# Patient Record
Sex: Male | Born: 1955 | Race: Black or African American | Hispanic: No | Marital: Single | Smoking: Former smoker
Health system: Southern US, Community
[De-identification: ages and names within clinical notes are randomized; demographics above are authoritative.]

## PROBLEM LIST (undated history)

## (undated) DIAGNOSIS — I509 Heart failure, unspecified: Secondary | ICD-10-CM

## (undated) DIAGNOSIS — E7800 Pure hypercholesterolemia, unspecified: Secondary | ICD-10-CM

## (undated) DIAGNOSIS — I10 Essential (primary) hypertension: Secondary | ICD-10-CM

## (undated) HISTORY — DX: Heart failure, unspecified: I50.9

## (undated) HISTORY — PX: APPENDECTOMY: SHX54

## (undated) HISTORY — PX: LUMBAR DISC SURGERY: SHX700

## (undated) HISTORY — PX: BACK SURGERY: SHX140

---

## 1998-11-13 ENCOUNTER — Emergency Department (HOSPITAL_COMMUNITY): Admission: EM | Admit: 1998-11-13 | Discharge: 1998-11-13 | Payer: Self-pay | Admitting: Emergency Medicine

## 1999-10-15 ENCOUNTER — Ambulatory Visit (HOSPITAL_COMMUNITY): Admission: RE | Admit: 1999-10-15 | Discharge: 1999-10-15 | Payer: Self-pay | Admitting: Occupational Medicine

## 1999-10-15 ENCOUNTER — Encounter: Payer: Self-pay | Admitting: Occupational Medicine

## 1999-11-20 ENCOUNTER — Emergency Department (HOSPITAL_COMMUNITY): Admission: EM | Admit: 1999-11-20 | Discharge: 1999-11-20 | Payer: Self-pay | Admitting: Emergency Medicine

## 1999-11-28 ENCOUNTER — Ambulatory Visit (HOSPITAL_COMMUNITY): Admission: RE | Admit: 1999-11-28 | Discharge: 1999-11-28 | Payer: Self-pay | Admitting: Neurosurgery

## 1999-11-28 ENCOUNTER — Encounter: Payer: Self-pay | Admitting: Neurosurgery

## 1999-12-29 ENCOUNTER — Ambulatory Visit (HOSPITAL_COMMUNITY): Admission: RE | Admit: 1999-12-29 | Discharge: 1999-12-29 | Payer: Self-pay | Admitting: *Deleted

## 1999-12-29 ENCOUNTER — Encounter: Payer: Self-pay | Admitting: *Deleted

## 2000-01-12 ENCOUNTER — Ambulatory Visit (HOSPITAL_COMMUNITY): Admission: RE | Admit: 2000-01-12 | Discharge: 2000-01-12 | Payer: Self-pay | Admitting: *Deleted

## 2000-01-12 ENCOUNTER — Encounter: Payer: Self-pay | Admitting: Neurosurgery

## 2000-01-22 ENCOUNTER — Ambulatory Visit (HOSPITAL_COMMUNITY): Admission: RE | Admit: 2000-01-22 | Discharge: 2000-01-22 | Payer: Self-pay | Admitting: *Deleted

## 2000-01-22 ENCOUNTER — Encounter: Payer: Self-pay | Admitting: *Deleted

## 2000-03-18 ENCOUNTER — Ambulatory Visit (HOSPITAL_COMMUNITY): Admission: RE | Admit: 2000-03-18 | Discharge: 2000-03-19 | Payer: Self-pay | Admitting: Neurosurgery

## 2000-03-18 ENCOUNTER — Encounter: Payer: Self-pay | Admitting: Neurosurgery

## 2000-09-23 ENCOUNTER — Encounter: Payer: Self-pay | Admitting: Neurosurgery

## 2000-09-23 ENCOUNTER — Ambulatory Visit (HOSPITAL_COMMUNITY): Admission: RE | Admit: 2000-09-23 | Discharge: 2000-09-23 | Payer: Self-pay | Admitting: Neurosurgery

## 2000-10-14 ENCOUNTER — Encounter: Admission: RE | Admit: 2000-10-14 | Discharge: 2001-01-12 | Payer: Self-pay | Admitting: Anesthesiology

## 2000-10-15 ENCOUNTER — Encounter: Payer: Self-pay | Admitting: Neurosurgery

## 2000-10-15 ENCOUNTER — Ambulatory Visit (HOSPITAL_COMMUNITY): Admission: RE | Admit: 2000-10-15 | Discharge: 2000-10-15 | Payer: Self-pay | Admitting: Neurosurgery

## 2001-02-03 ENCOUNTER — Encounter: Payer: Self-pay | Admitting: Neurosurgery

## 2001-02-08 ENCOUNTER — Encounter: Payer: Self-pay | Admitting: Neurosurgery

## 2001-02-08 ENCOUNTER — Inpatient Hospital Stay (HOSPITAL_COMMUNITY): Admission: RE | Admit: 2001-02-08 | Discharge: 2001-02-11 | Payer: Self-pay | Admitting: Neurosurgery

## 2001-02-21 ENCOUNTER — Encounter: Admission: RE | Admit: 2001-02-21 | Discharge: 2001-04-09 | Payer: Self-pay | Admitting: Anesthesiology

## 2001-02-22 ENCOUNTER — Emergency Department (HOSPITAL_COMMUNITY): Admission: EM | Admit: 2001-02-22 | Discharge: 2001-02-22 | Payer: Self-pay | Admitting: Emergency Medicine

## 2001-02-22 ENCOUNTER — Emergency Department (HOSPITAL_COMMUNITY): Admission: EM | Admit: 2001-02-22 | Discharge: 2001-02-23 | Payer: Self-pay | Admitting: Emergency Medicine

## 2001-02-22 ENCOUNTER — Encounter: Payer: Self-pay | Admitting: Emergency Medicine

## 2001-03-10 ENCOUNTER — Other Ambulatory Visit (HOSPITAL_COMMUNITY): Admission: RE | Admit: 2001-03-10 | Discharge: 2001-03-15 | Payer: Self-pay | Admitting: Psychiatry

## 2001-04-01 ENCOUNTER — Encounter: Payer: Self-pay | Admitting: Neurosurgery

## 2001-04-01 ENCOUNTER — Ambulatory Visit (HOSPITAL_COMMUNITY): Admission: RE | Admit: 2001-04-01 | Discharge: 2001-04-01 | Payer: Self-pay | Admitting: Neurosurgery

## 2005-09-20 ENCOUNTER — Emergency Department (HOSPITAL_COMMUNITY): Admission: EM | Admit: 2005-09-20 | Discharge: 2005-09-20 | Payer: Self-pay | Admitting: Emergency Medicine

## 2005-09-20 IMAGING — CT CT HEAD W/O CM
1 series · 16 of 30 positions shown, 20 images · non-contrast
Comparison: None

CLINICAL DATA: Headache

HEAD CT WITHOUT CONTRAST
TECHNIQUE: 5mm collimated images were obtained from the base of the skull
through the vertex according to standard protocol without contrast.

[Series 2: head_seq 4.5 h45s st · axial · 0.43mm/px · z∈[-191,-29]mm · 16 of 40 slices shown, 20 images]
[im 2/40  brain]
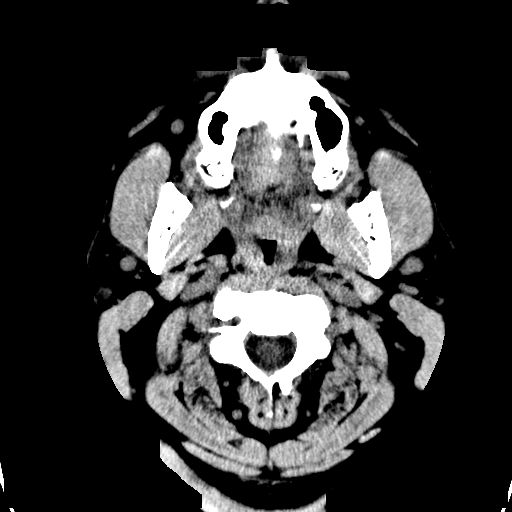
[im 2/40  bone]
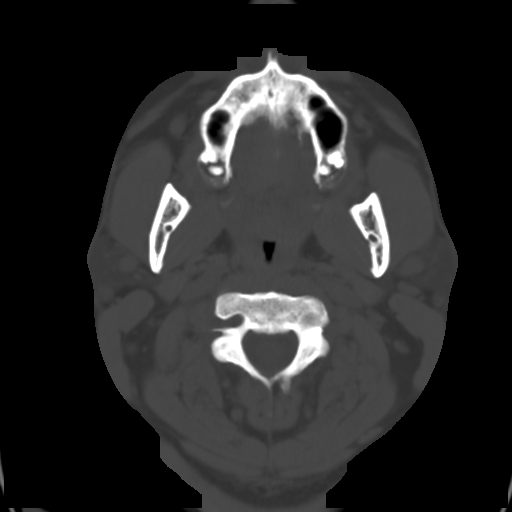
[im 5/40  brain]
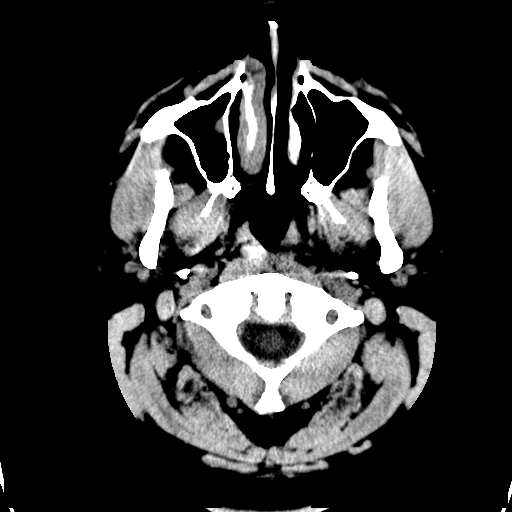
[im 7/40  brain]
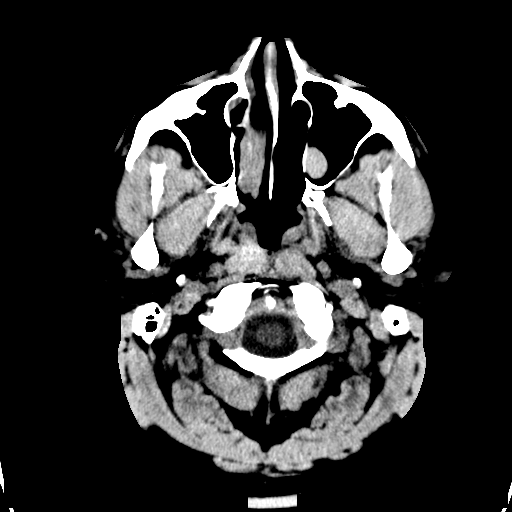
[im 10/40  brain]
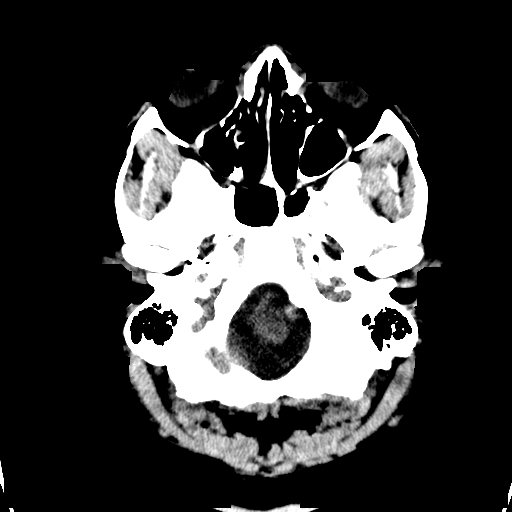
[im 11/40  brain]
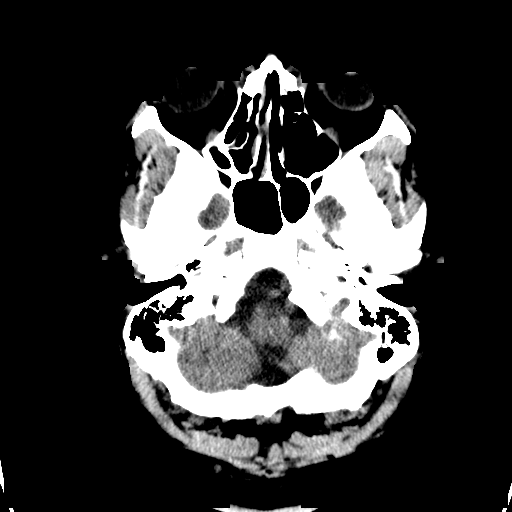
[im 11/40  bone]
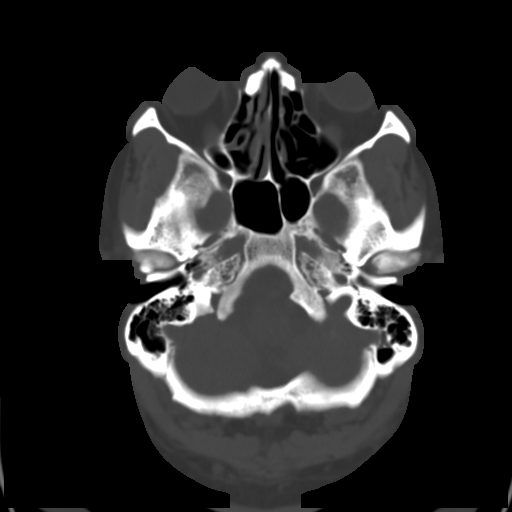
[im 14/40  brain]
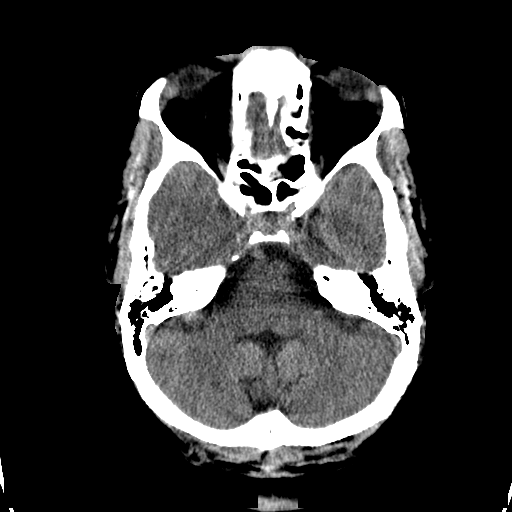
[im 17/40  brain]
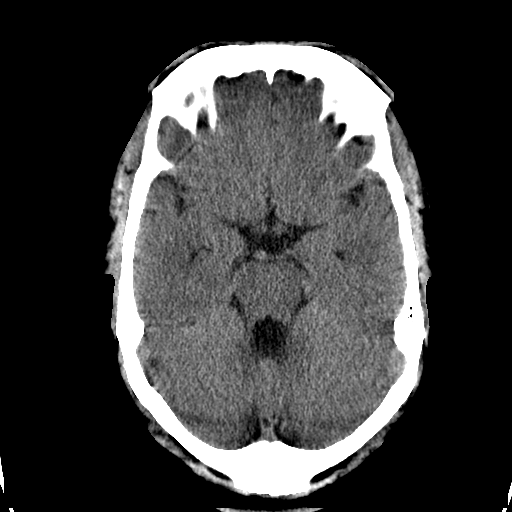
[im 19/40  brain]
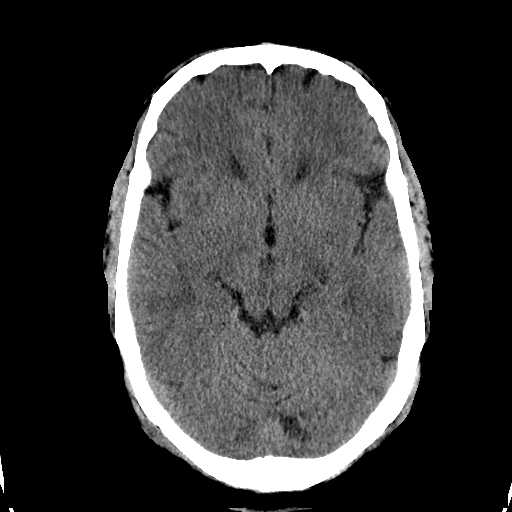
[im 21/40  brain]
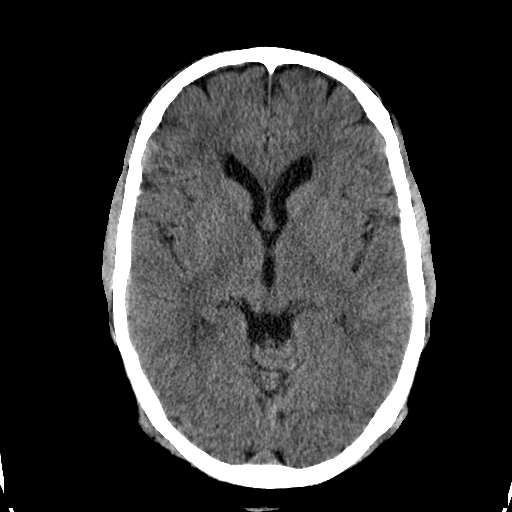
[im 21/40  bone]
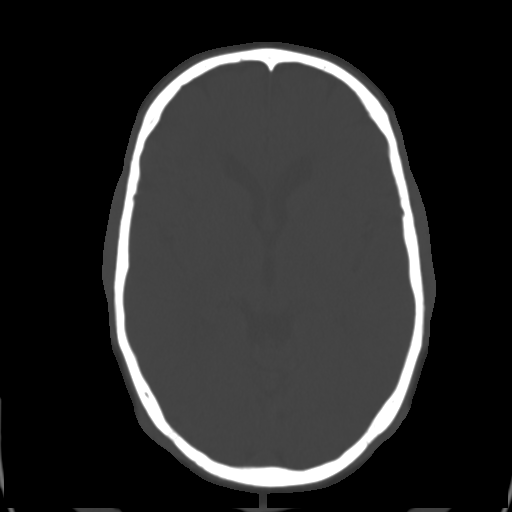
[im 23/40  brain]
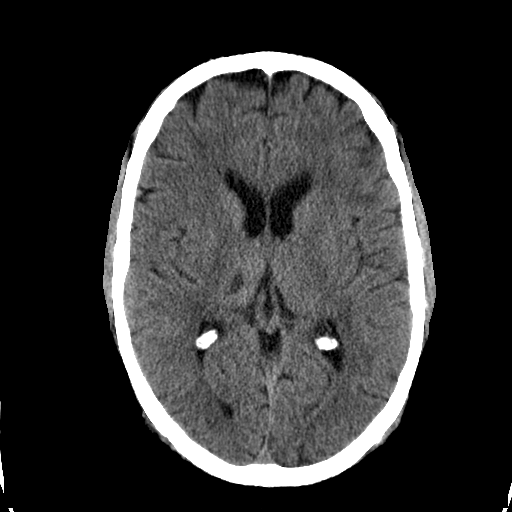
[im 26/40  brain]
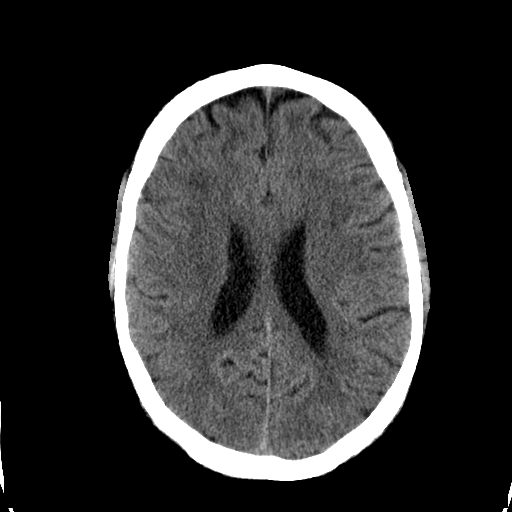
[im 29/40  brain]
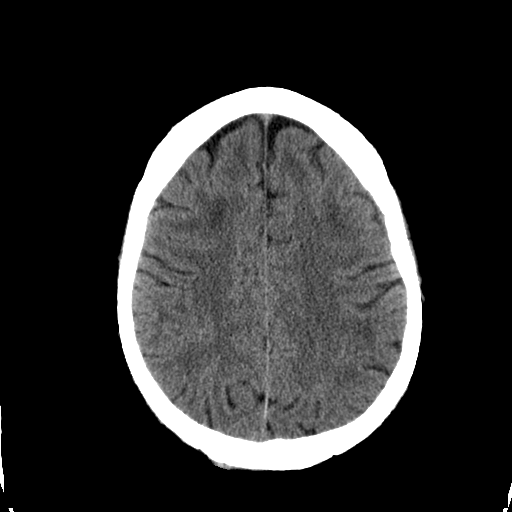
[im 30/40  brain]
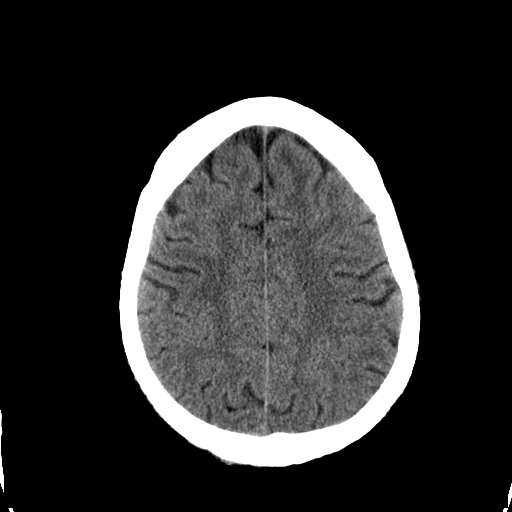
[im 30/40  bone]
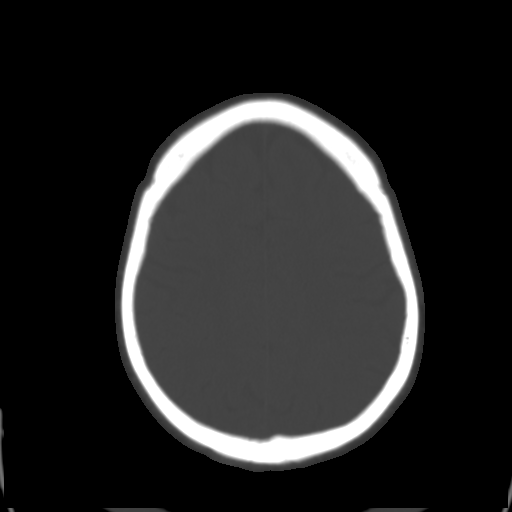
[im 33/40  brain]
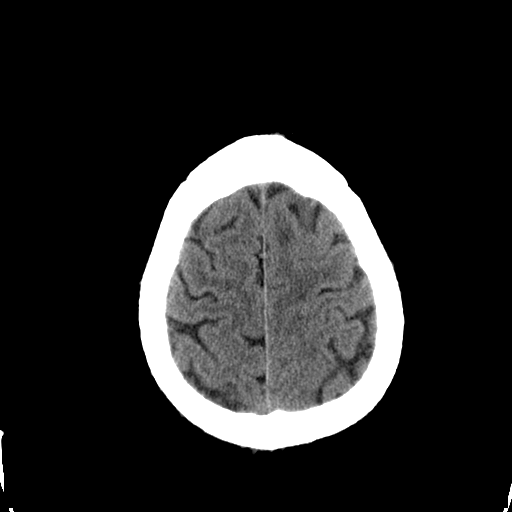
[im 35/40  brain]
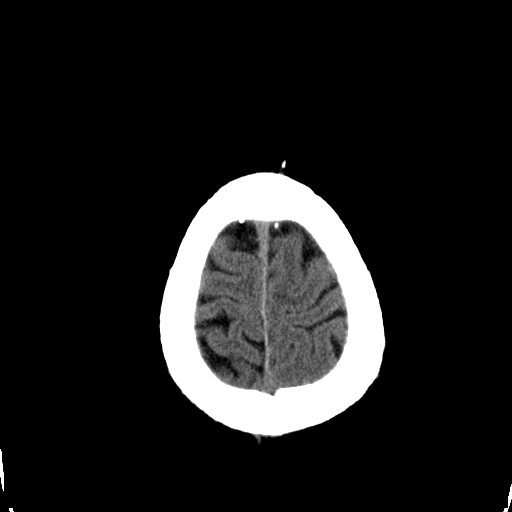
[im 38/40  brain]
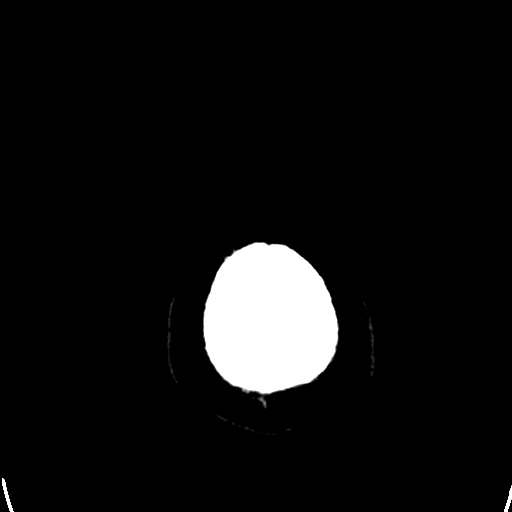

[16 of 30 positions shown; findings below may reference images not displayed]

FINDINGS: There is a low density area within the right thalamus compatible with
lacunar infarct. This is likely subacute or chronic in age. No areas of
hemorrhage, hydrocephalus, tumor, vascular lesion, or acute infarction. Chronic
sinusitis changes. Visualized calvarium unremarkable.

IMPRESSION

Subacute to chronic right thalamic lacunar infarct.

## 2006-02-16 ENCOUNTER — Emergency Department (HOSPITAL_COMMUNITY): Admission: EM | Admit: 2006-02-16 | Discharge: 2006-02-16 | Payer: Self-pay | Admitting: Emergency Medicine

## 2006-02-16 IMAGING — CR DG CHEST 2V
2 series · 2 of 2 positions shown · non-contrast
Comparison: None.

CLINICAL DATA: Weakness and chest pain.  
 CHEST - 2 VIEW:

[view not recorded (1 of 2)]
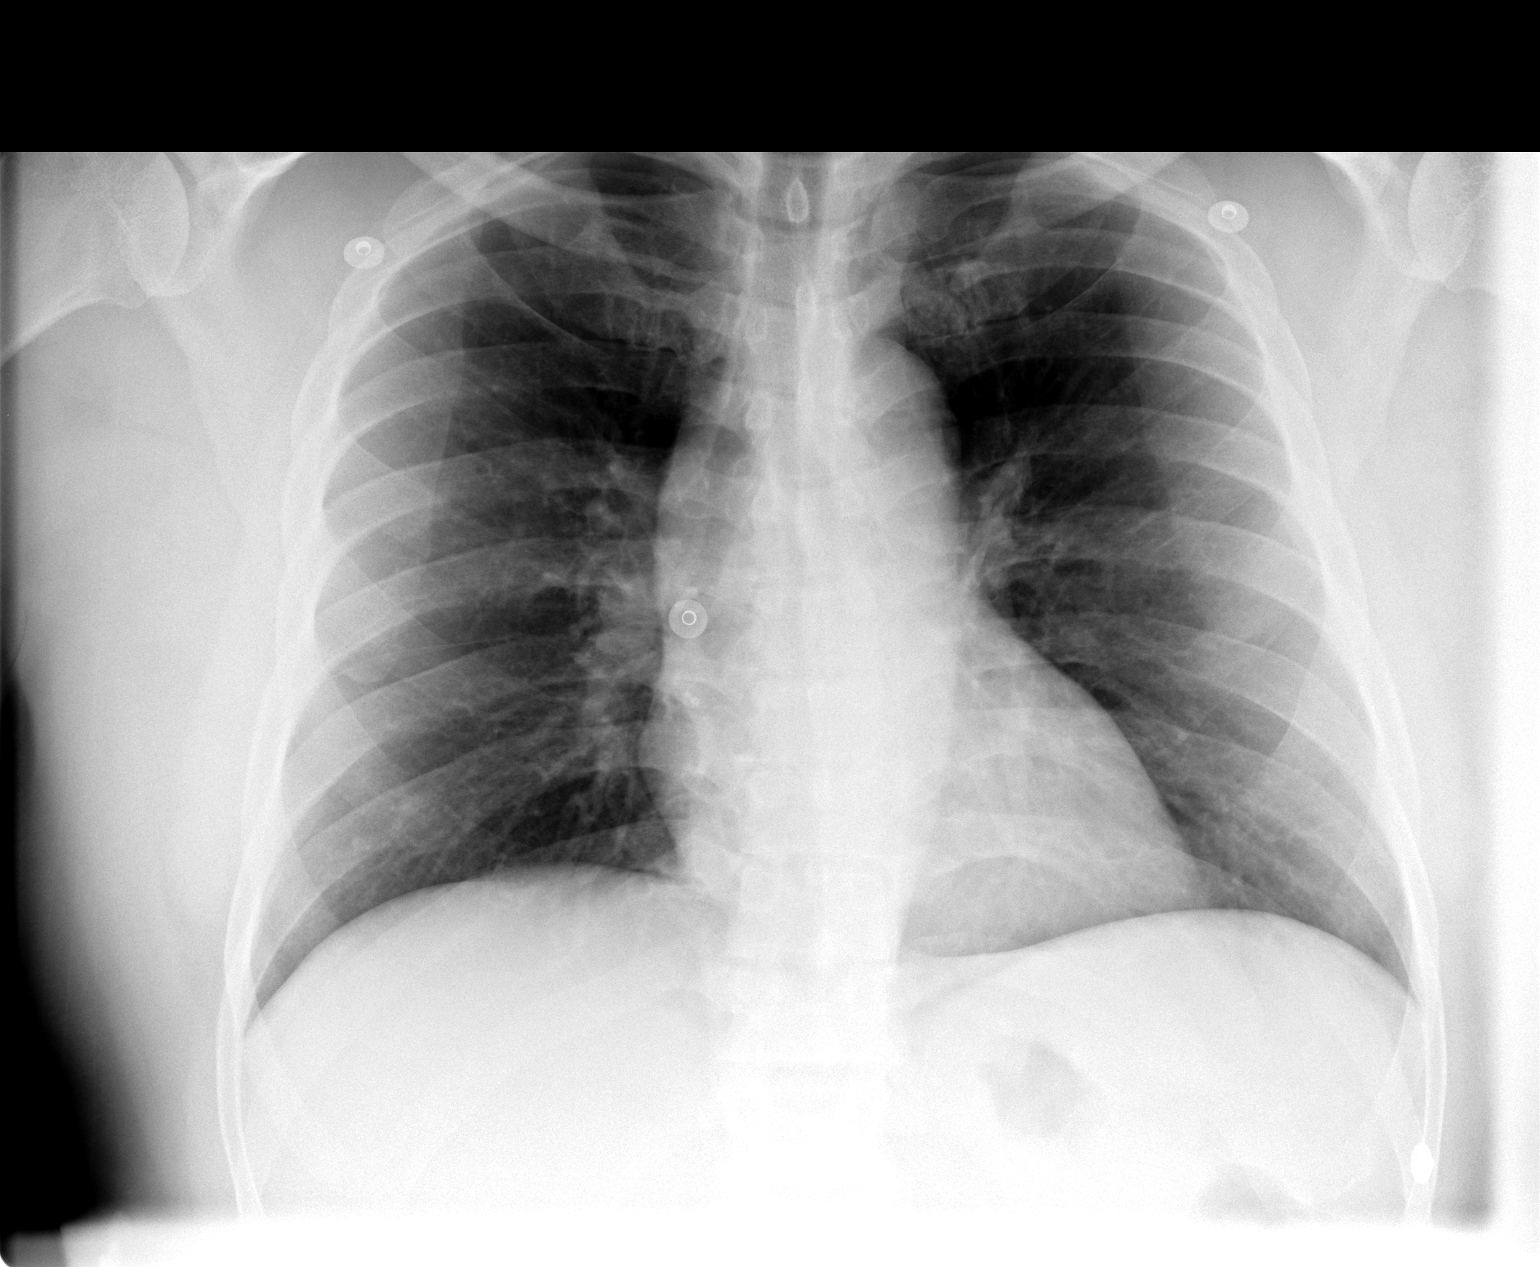

[view not recorded (2 of 2)]
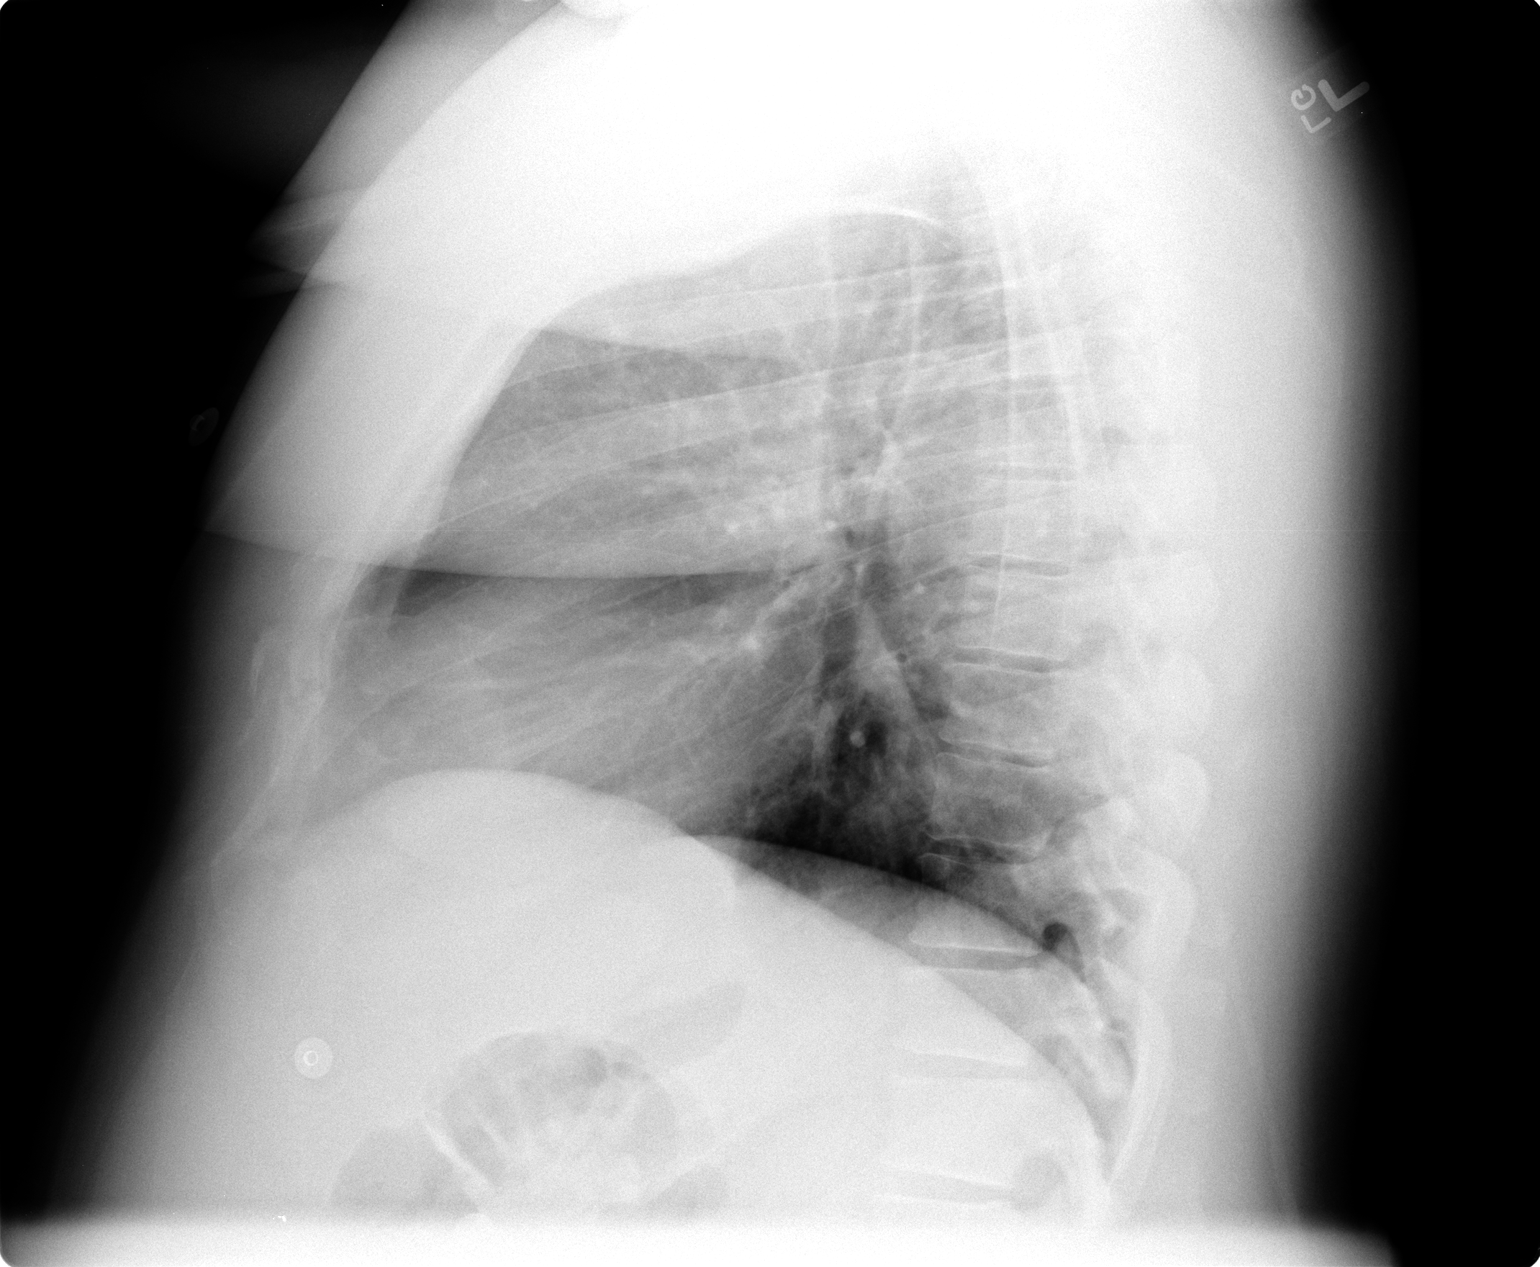

[2 of 2 positions shown; findings below may reference images not displayed]

FINDINGS: Cardiomediastinal silhouette is unremarkable. The lungs are clear.  No evidence of focal airspace disease, pneumothorax, or pleural effusions.  Minimal peribronchial thickening is identified likely chronic.  The visualized bony thorax and upper abdomen are unremarkable.
IMPRESSION: Mild peribronchial thickening without focal airspace disease.

## 2006-02-16 IMAGING — CT CT HEAD W/O CM
1 series · 16 of 30 positions shown, 20 images · IV contrast (agent unspecified)
Comparison: [DATE].

CLINICAL DATA: Headache, left-sided weakness.
 HEAD CT WITHOUT CONTRAST:
TECHNIQUE: Contiguous axial images were obtained from the base of the skull through the vertex according to standard protocol without contrast.

[Series 2: head_seq 5.0 h45s · axial · 0.43mm/px · z∈[-140,-5]mm · 16 of 30 slices shown, 20 images]
[im 2/30  brain]
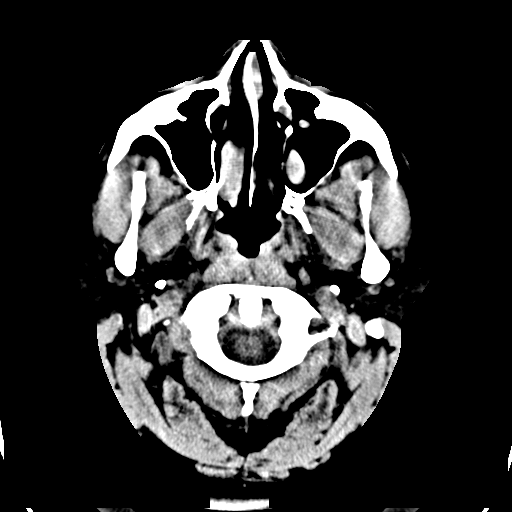
[im 2/30  bone]
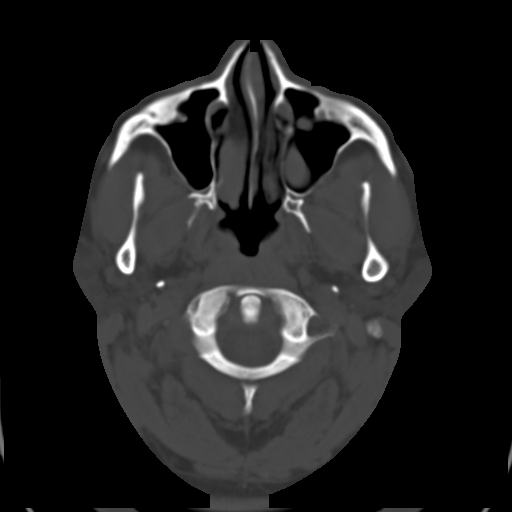
[im 4/30  brain]
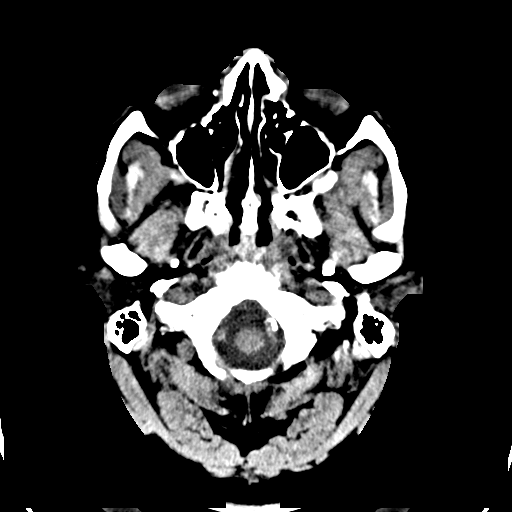
[im 6/30  brain]
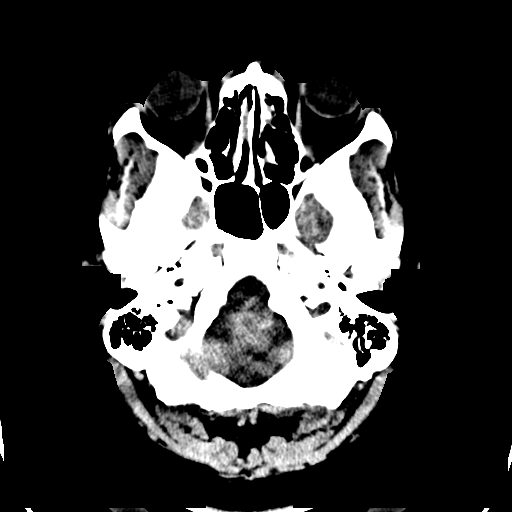
[im 8/30  brain]
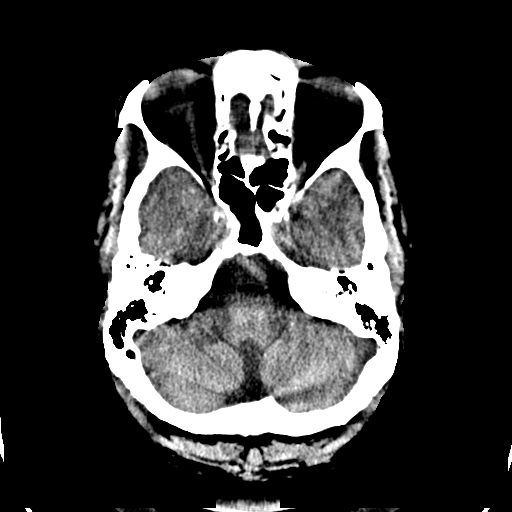
[im 9/30  brain]
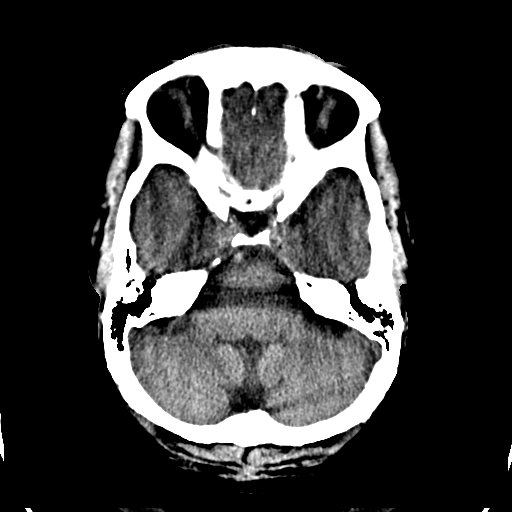
[im 9/30  bone]
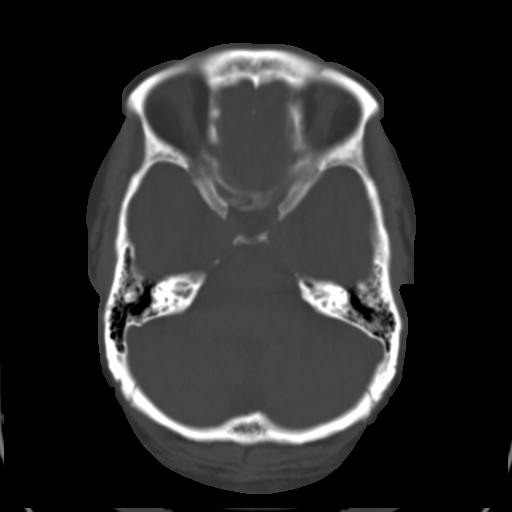
[im 11/30  brain]
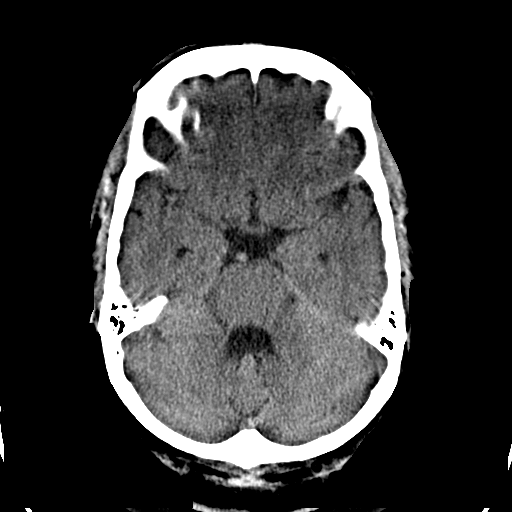
[im 13/30  brain]
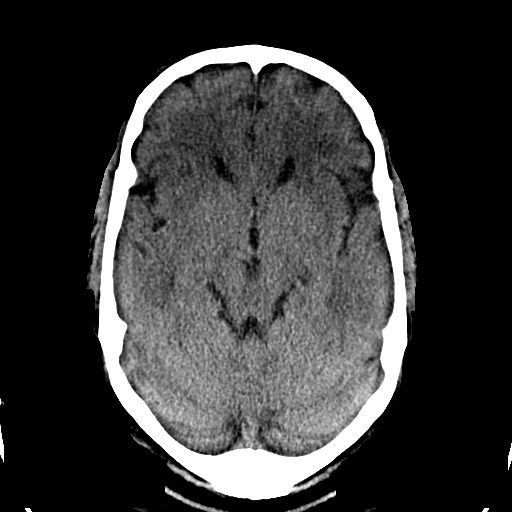
[im 15/30  brain]
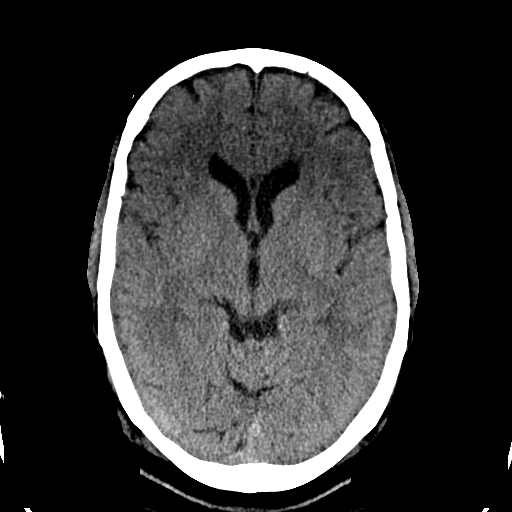
[im 16/30  brain]
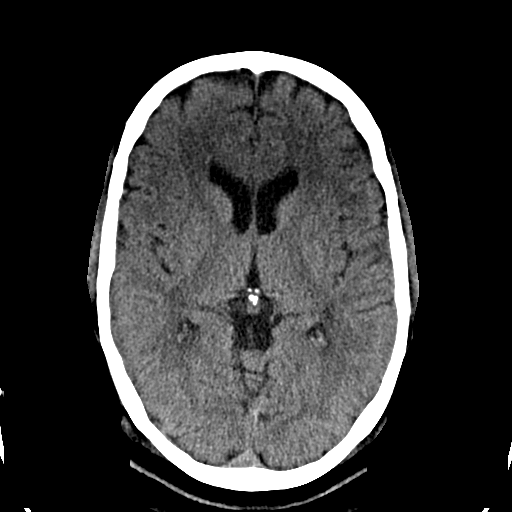
[im 16/30  bone]
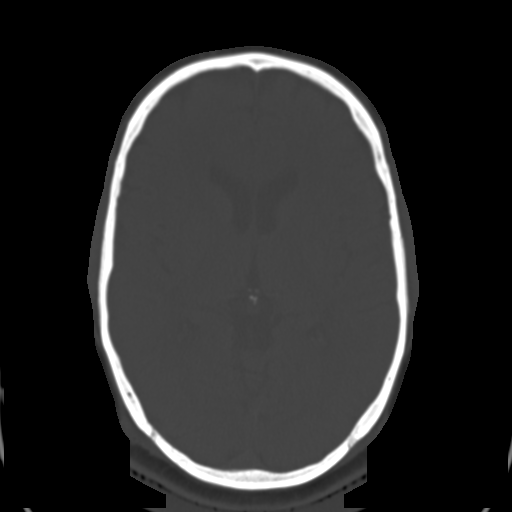
[im 18/30  brain]
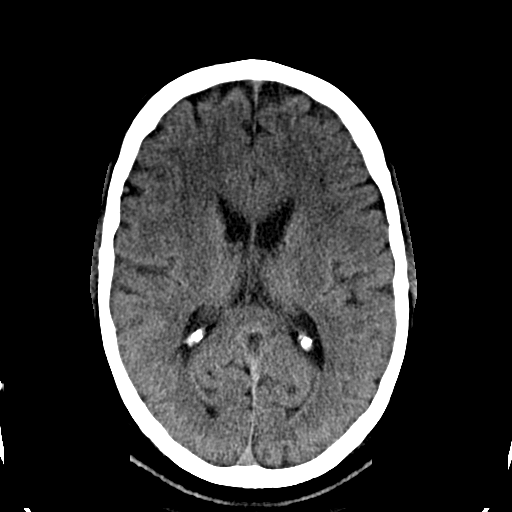
[im 20/30  brain]
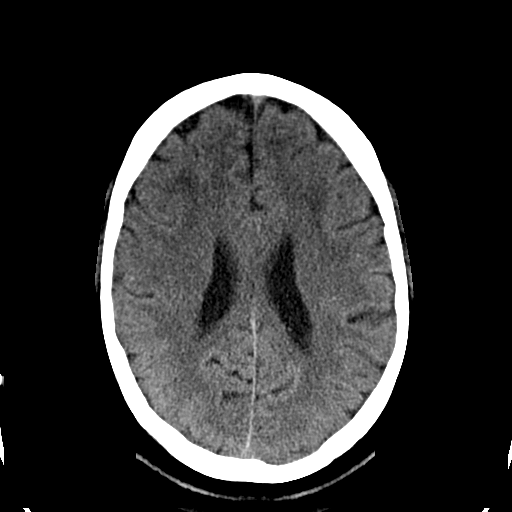
[im 22/30  brain]
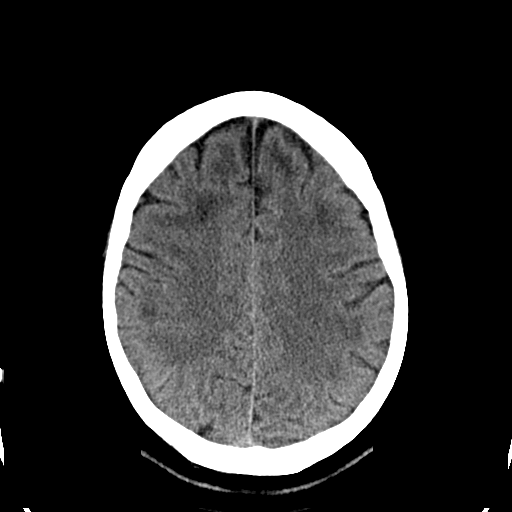
[im 23/30  brain]
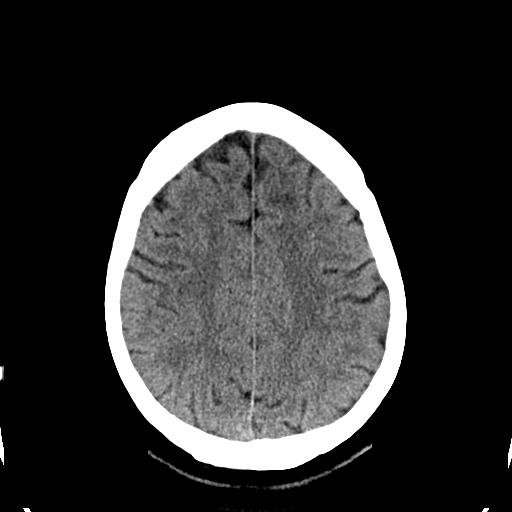
[im 23/30  bone]
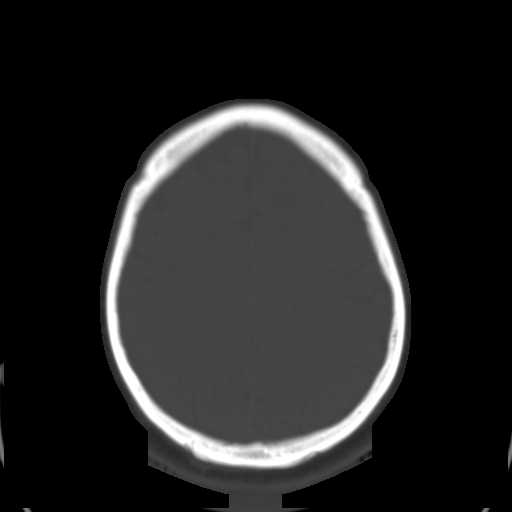
[im 25/30  brain]
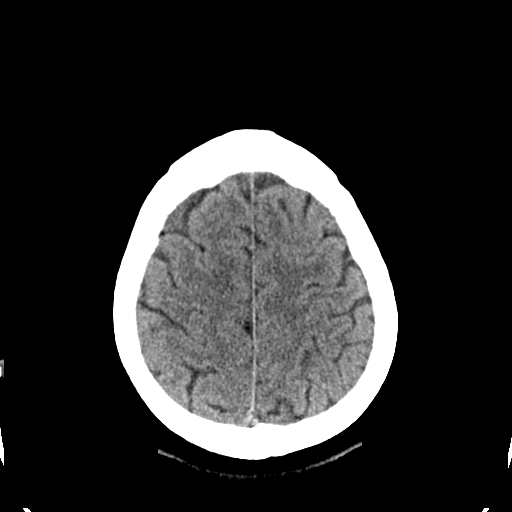
[im 27/30  brain]
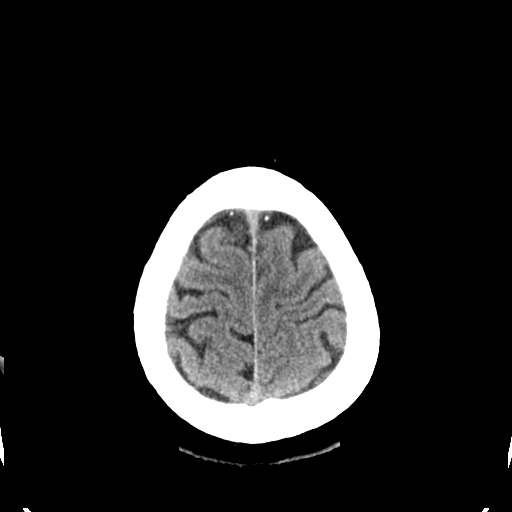
[im 29/30  brain]
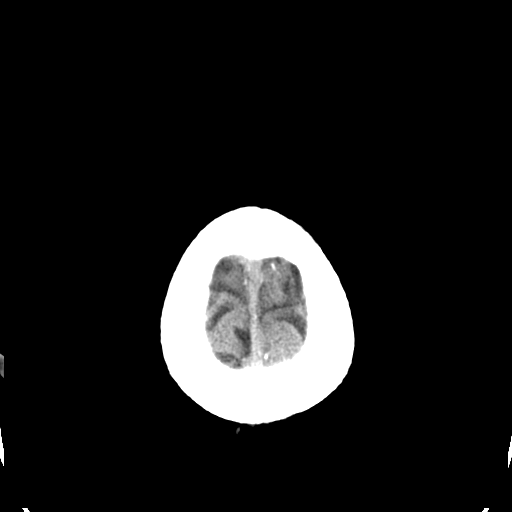

[16 of 30 positions shown; findings below may reference images not displayed]

FINDINGS: Ventricular size and CSF spaces within normal limits.  There is a right thalamic infarct that has not changed.  There is some periventricular white matter disease mostly noted in the frontal lobes bilaterally.  There is a small low density area in the right parietal region seen on image #22.  I cannot say that this was present previously.  This could be a small infarct. 
 No definite acute infarct or bleed. 
 Calvarium intact.  No fluid in the sinuses visualized, although there is a left maxillary retention cyst or polyp that was present previously.
IMPRESSION: 1.  There is chronic microvascular white matter disease.
 2.  Possible small acute right parietal infarct.  See report.

## 2009-08-18 ENCOUNTER — Emergency Department (HOSPITAL_BASED_OUTPATIENT_CLINIC_OR_DEPARTMENT_OTHER): Admission: EM | Admit: 2009-08-18 | Discharge: 2009-08-18 | Payer: Self-pay | Admitting: Emergency Medicine

## 2009-08-18 ENCOUNTER — Ambulatory Visit: Payer: Self-pay | Admitting: Diagnostic Radiology

## 2009-08-18 IMAGING — CR DG CHEST 2V
2 series · 2 of 2 positions shown · non-contrast
Comparison: [DATE]

CLINICAL DATA: Congestion and cough.

CHEST - 2 VIEW

[w chest pa]
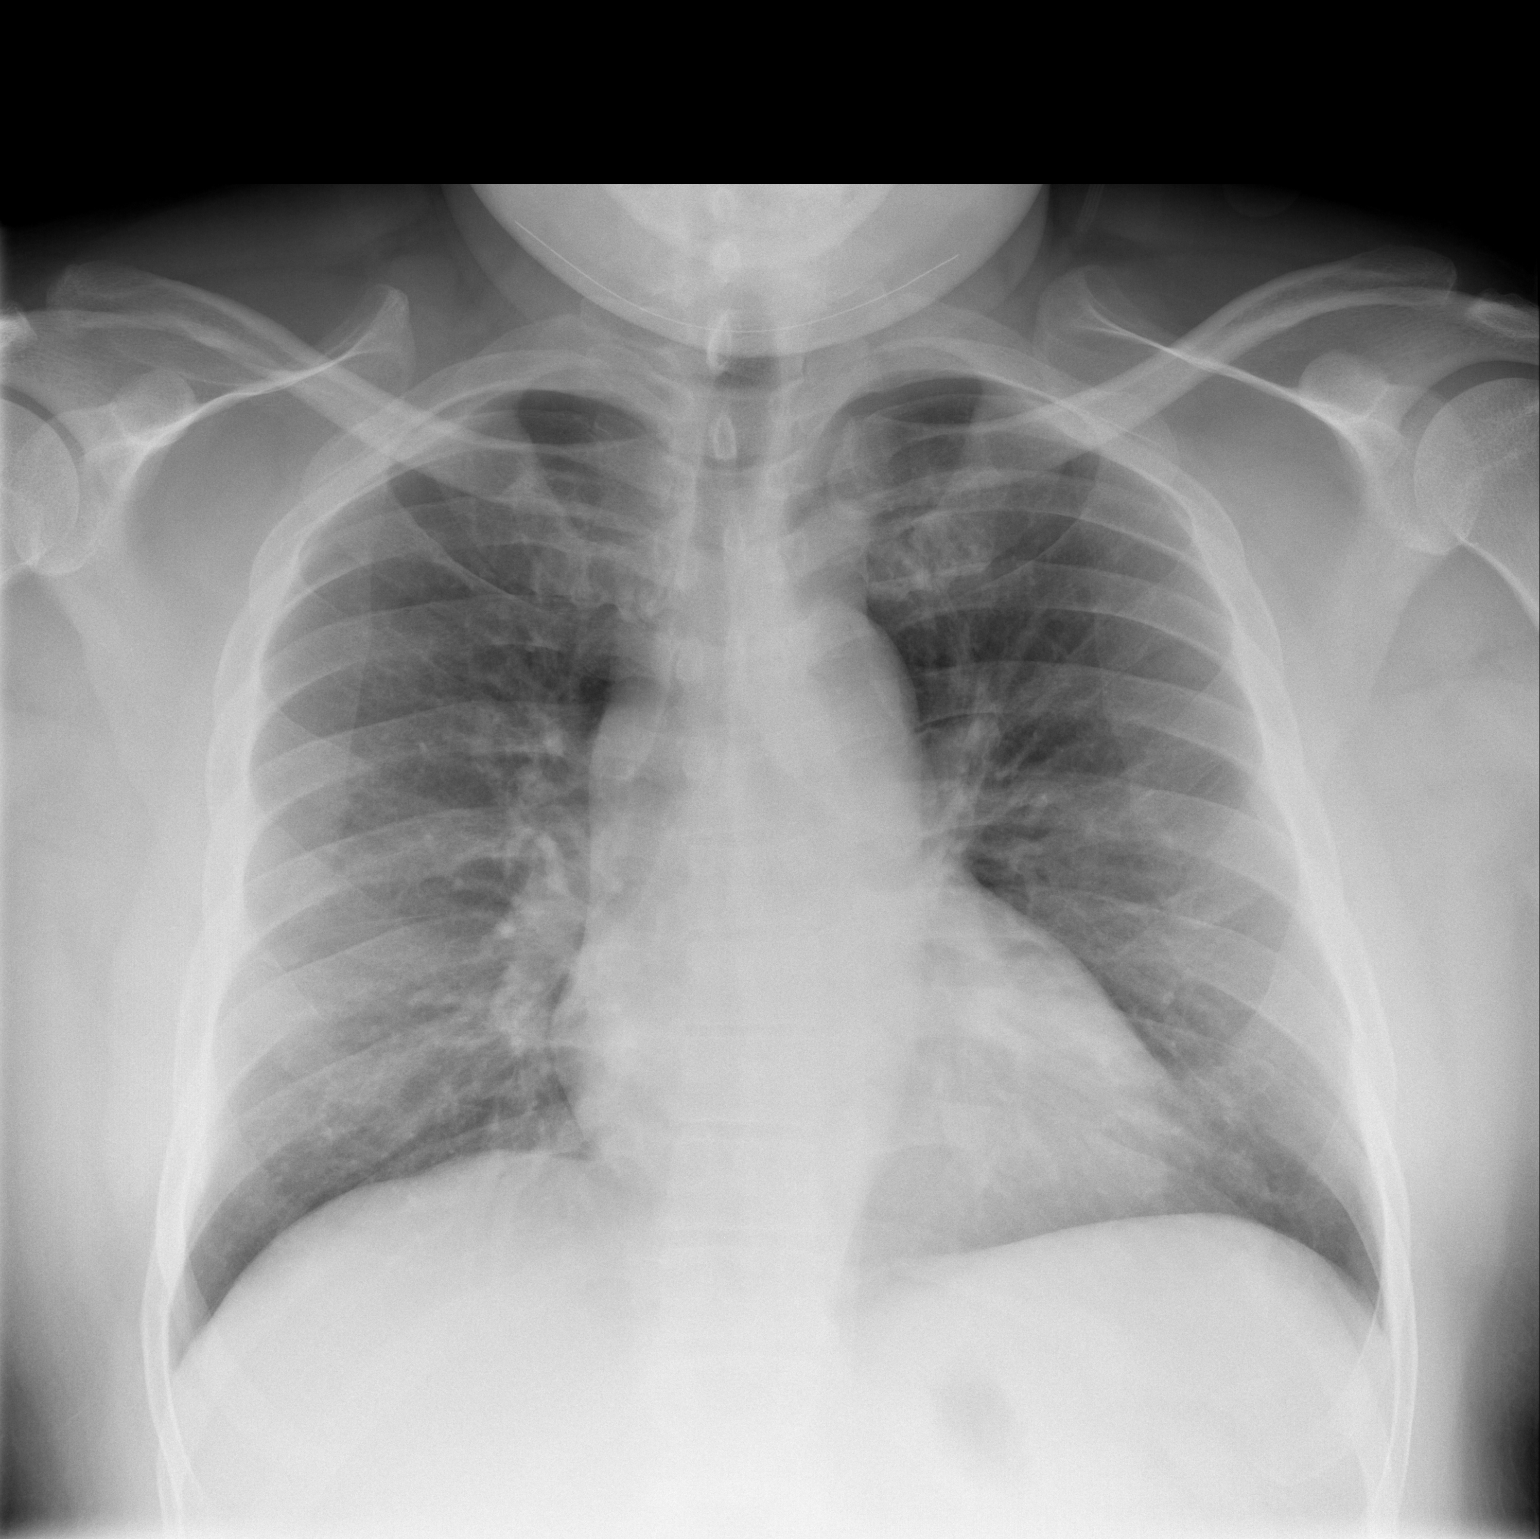

[w chest lat]
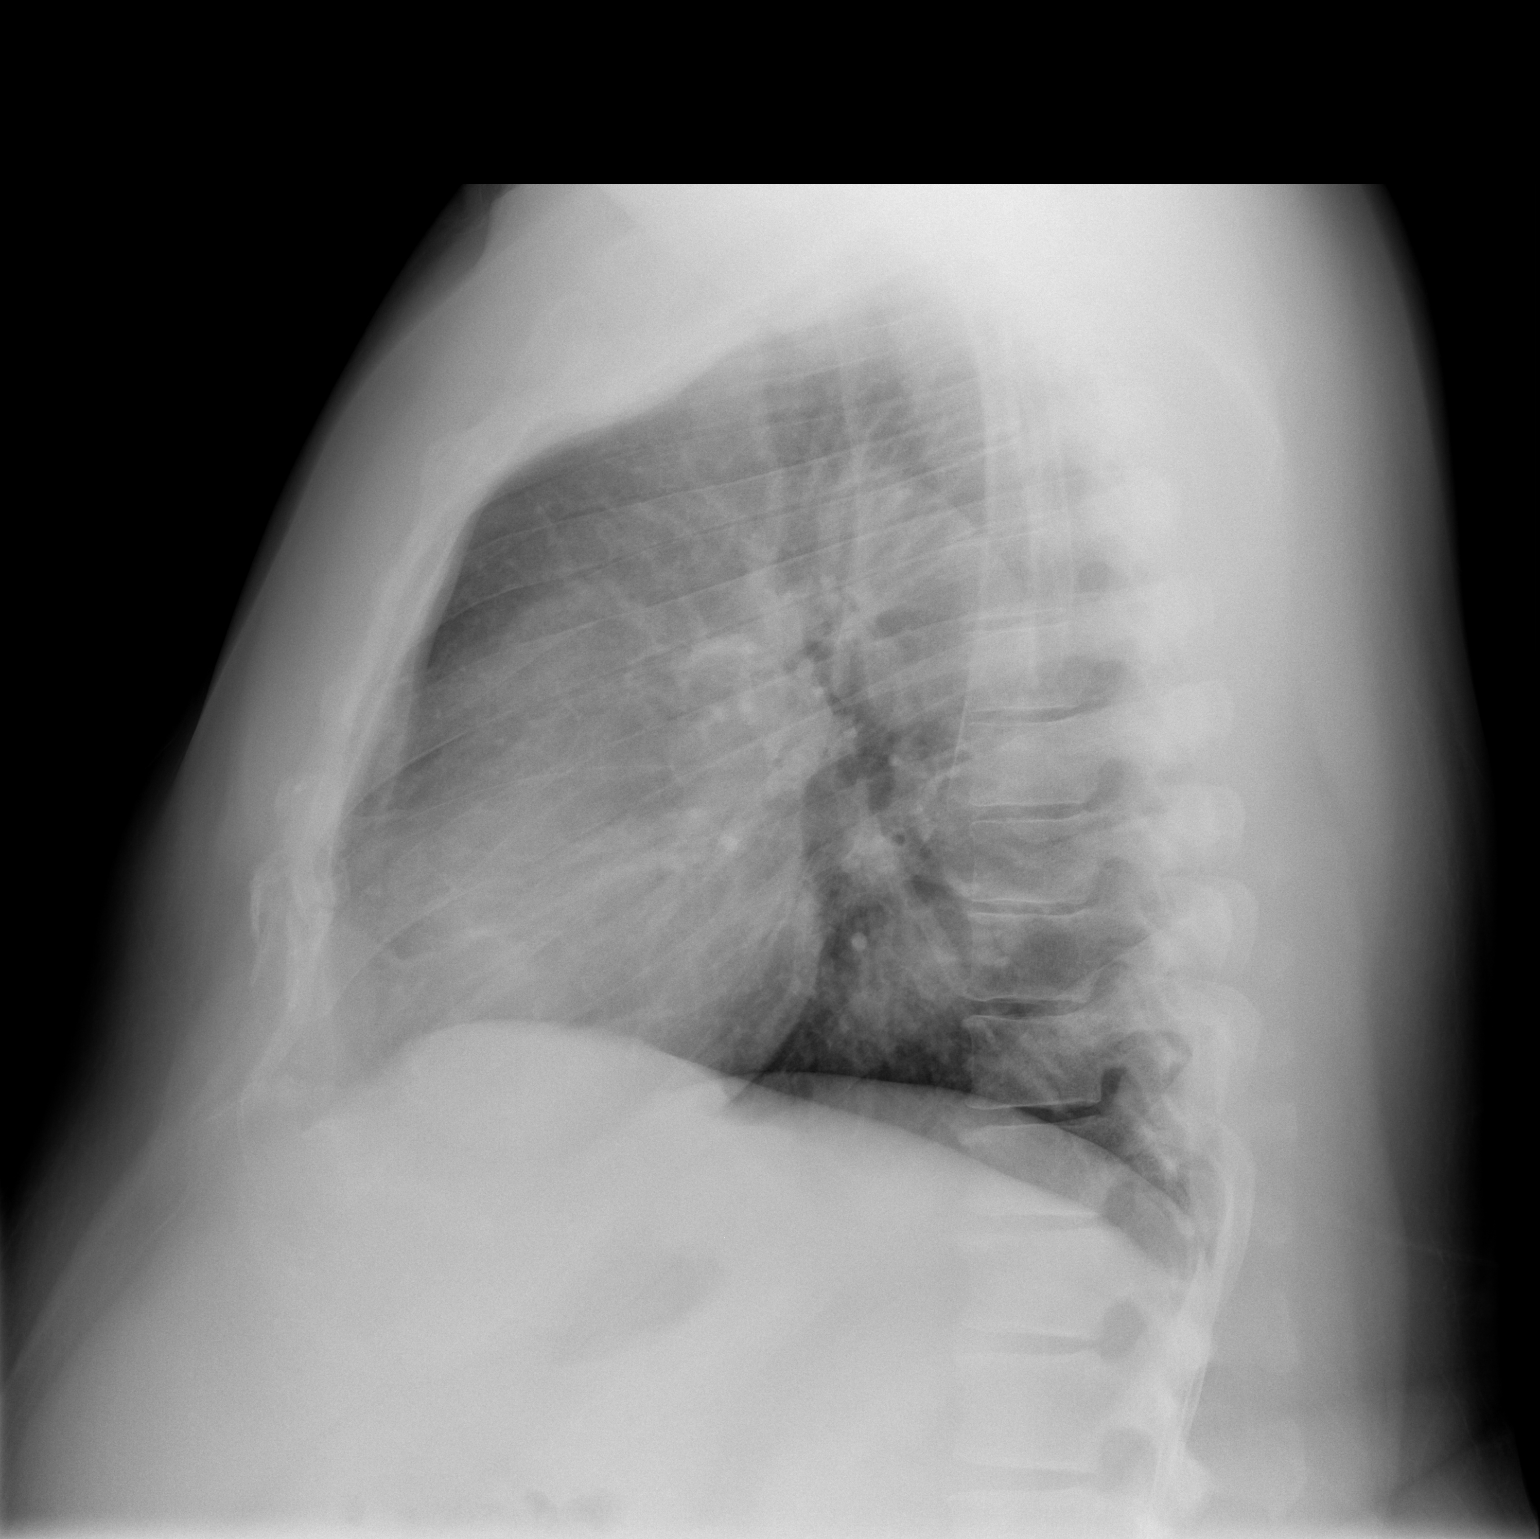

[2 of 2 positions shown; findings below may reference images not displayed]

FINDINGS: The cardiac silhouette, mediastinal and hilar contours
are within normal limits.  There are mild bronchitic type lung
changes with peribronchial thickening, increased interstitial
markings and streaky areas of atelectasis.  This may suggest
bronchitis, interstitial pneumonitis or reactive airways disease.
No focal infiltrate or pleural effusion.  The bony thorax is
intact.
IMPRESSION: 1.  Bronchitic type lung changes which may suggest bronchitis,
interstitial pneumonitis or reactive airways disease.
2.  No focal infiltrates.

## 2009-10-07 ENCOUNTER — Ambulatory Visit: Payer: Self-pay | Admitting: Diagnostic Radiology

## 2009-10-07 ENCOUNTER — Emergency Department (HOSPITAL_BASED_OUTPATIENT_CLINIC_OR_DEPARTMENT_OTHER): Admission: EM | Admit: 2009-10-07 | Discharge: 2009-10-07 | Payer: Self-pay | Admitting: Emergency Medicine

## 2009-10-07 IMAGING — CR DG CHEST 2V
2 series · 2 of 2 positions shown · non-contrast
Comparison: Chest [DATE].

CLINICAL DATA: Shortness of breath.

CHEST - 2 VIEW

[w chest pa]
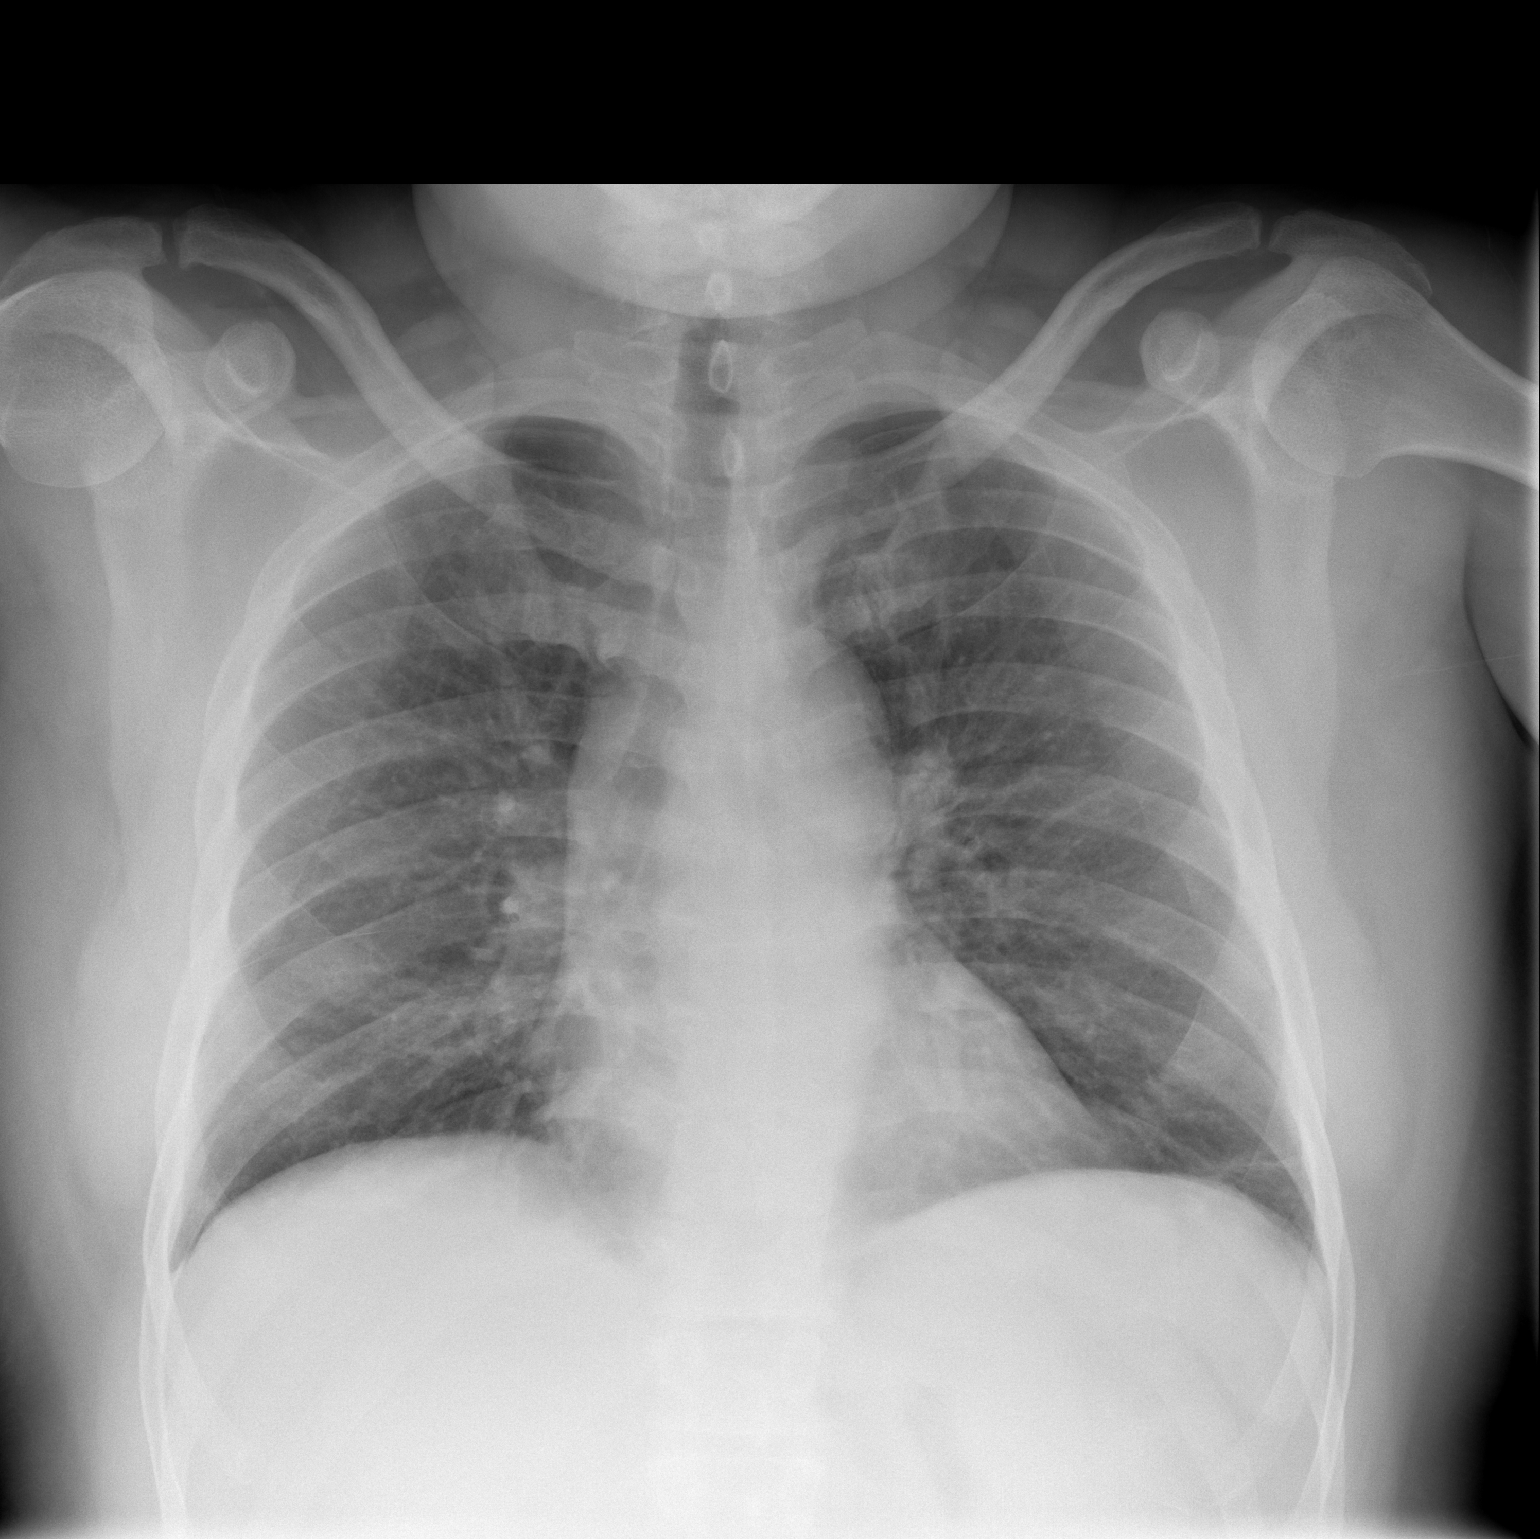

[w chest lat]
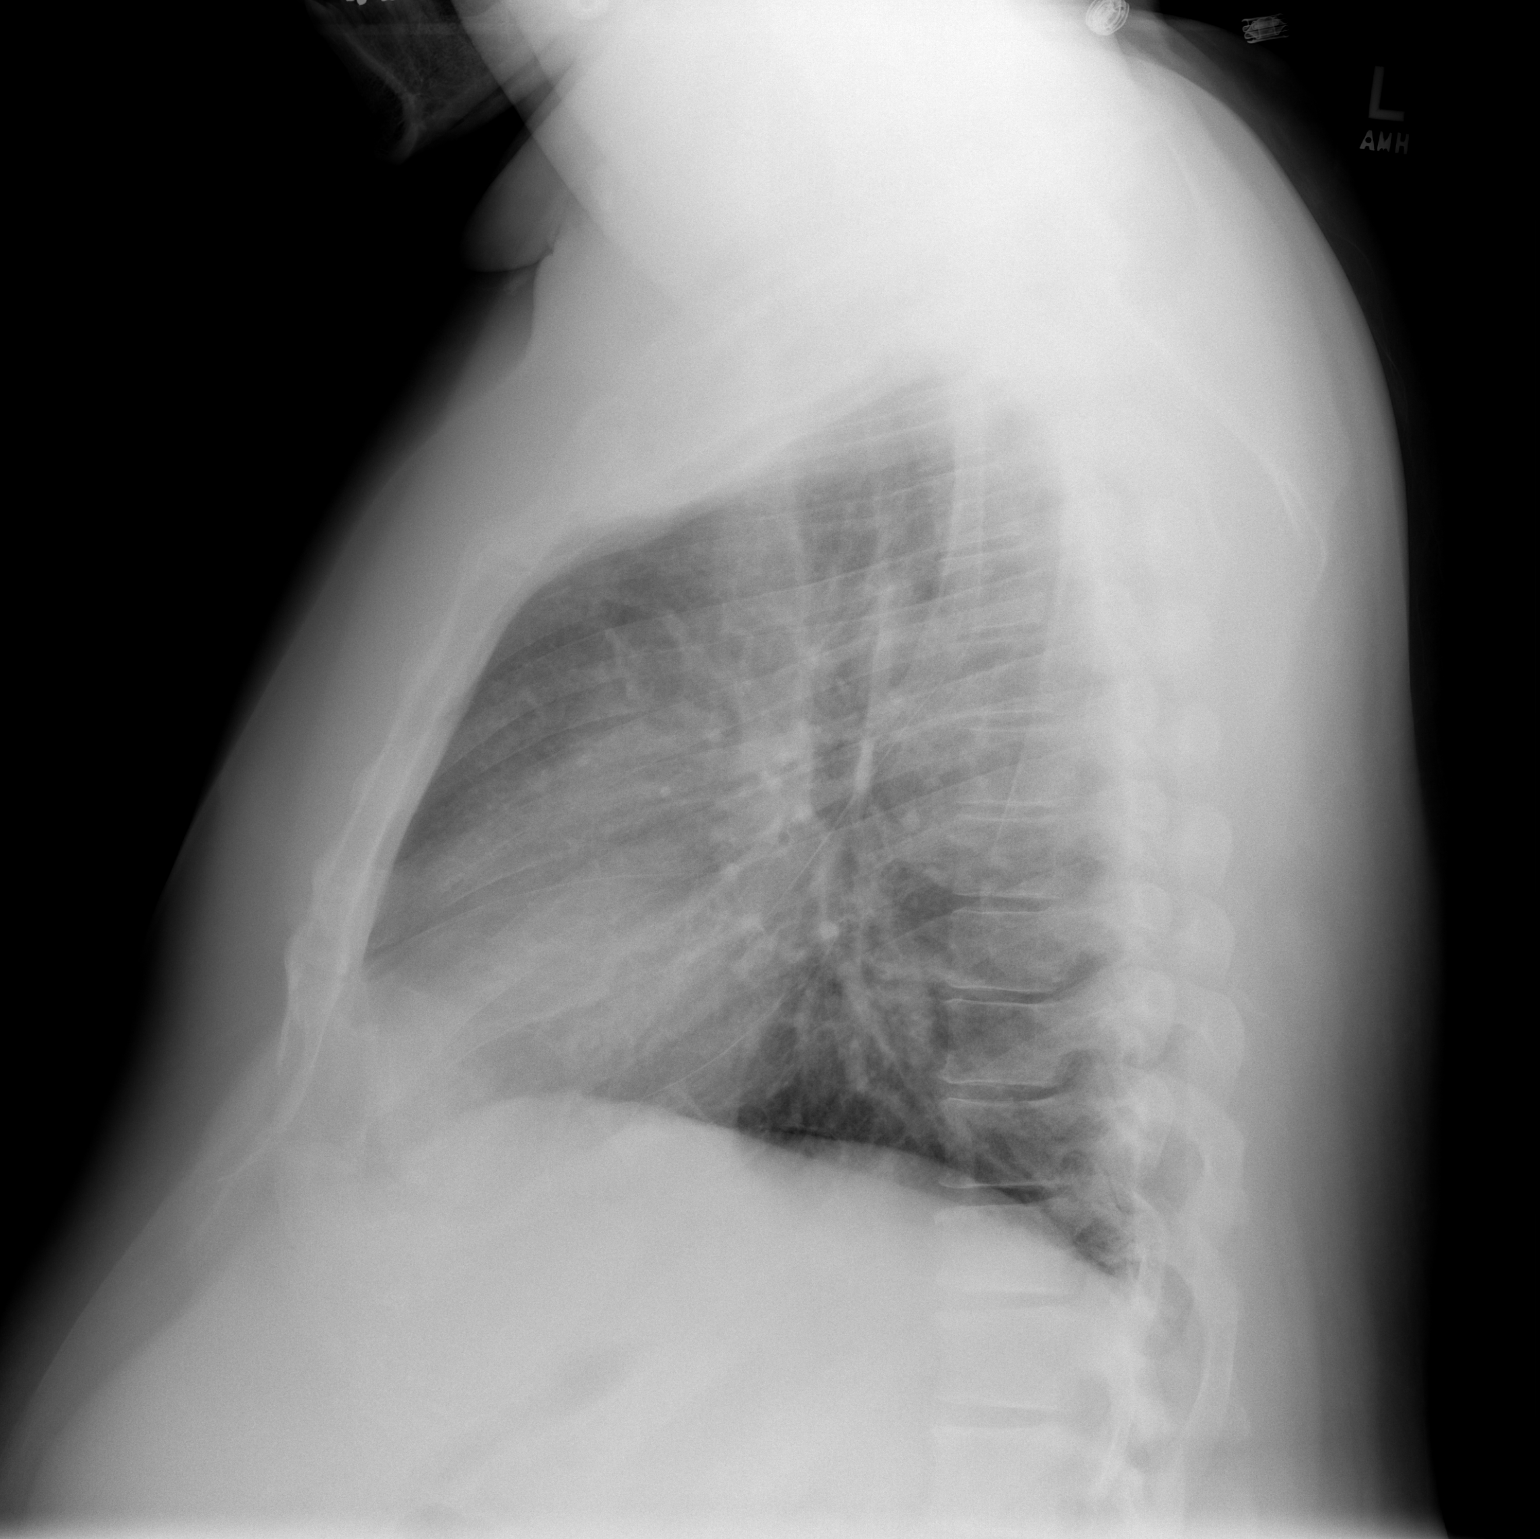

[2 of 2 positions shown; findings below may reference images not displayed]

FINDINGS: Minimal atelectasis is seen in the left lung base.  Lungs
otherwise clear.  No effusion.  Heart size normal.  No focal bony
abnormality.
IMPRESSION: No acute disease.

## 2010-10-26 LAB — RAPID STREP SCREEN (MED CTR MEBANE ONLY): Streptococcus, Group A Screen (Direct): NEGATIVE

## 2010-12-26 NOTE — H&P (Signed)
[facility]  Patient:    [name], [name]                       MRN: [id number] Adm. Date:  [id number] Attending:  Carolanne Grumbling D CC:         Donzetta Sprung. Roney Jaffe., M.D.   History and Physical  FOLLOWUP EVALUATION:  Colsen comes in for followup evaluation of his lumbar degenerative disk disease, status post surgical intervention, and depression. Since his last evaluation, the patient has been scheduled in over at [facility] and has been receiving therapy as an outpatient from them for his severe depression.  He did well postoperatively and notes he has minimal pain; he is down to 10 mg three times a day with his OxyContin.  PHYSICAL EXAMINATION:  VITAL SIGNS:  He exhibited vital signs stable with blood pressure 160/99, which is slightly elevated from previously, heart rate 63, respiratory rate 16, O2 saturation 94%.  Pain level is minimal.  GENERAL:  He is wearing his brace today.  NEUROLOGIC:  Deep tendon reflexes are symmetric in the lower extremities.  IMPRESSION: 1. Lumbar degenerative disk disease, marked improvement postoperatively. 2. Depression, which is persistent. 3. Hypertension.  DISPOSITION: 1. Resume OxyContin 10 mg at one twice a day. 2. Increase Paxil to 30 mg per day. 3. Follow up with me in 10 weeks. DD:  03/11/01 TD:  03/12/01 Job: [id number]

## 2010-12-26 NOTE — H&P (Signed)
[facility]  Patient:    Peter Terry, Peter Terry                       MRN: [id number] Adm. Date:  [id number] Attending:  Thyra Breed CC:         Donzetta Sprung. Roney Jaffe., M.D.  Workers Compensation   History and Physical  FOLLOWUP EVALUATION:  Peter Terry presents for followup today.  Since his last evaluation, he was taken to [facility], where he was detoxed because his sister felt like he was abusing his medications.  Mandeep states that he was not abusing his medications and is rather upset with his sister.  He was discharged on Valium, amitriptyline, Prozac, ibuprofen, Vioxx and Tylenol.  He states that he is in a lot of discomfort today.  He notes that walking will increase the discomfort and he localizes it to his lower back predominantly today.  He is wearing his brace.  CURRENT MEDICATIONS:  Diazepam, Vioxx, Prozac and Elavil.  EXAMINATION:  VITAL SIGNS:  Blood pressure 128/89, heart rate is 100, respiratory rate is 18, O2 saturation is 96%, pain level is 6/10 and temperature is 97.1.  NEUROLOGIC:  Deep tendon reflexes were hypoactive and symmetric in the lower extremities.  He is wearing a brace over his back.  He is awake, alert and oriented to person, place, time and reason for visit.  He does state that he is somewhat depressed today.  IMPRESSION: 1. Status post intervention for lumbar degenerative disk disease with positive    discogram with overall improvement with regard to that.  He does have some    acute postoperative pain. 2. Depression. 3. Hypertension.  DISPOSITION: 1. Resume OxyContin at 10 mg 1 p.o. q.8h., #90 with no refill.  He is aware    that he has to adhere to this closely. 2. Stop Prozac. 3. Stop Elavil. 4. Stop ibuprofen. 5. Paxil 20 mg 1 p.o. q.d., #30 with 3 refills.  He is aware of the    side-effects of this. 6. Vioxx 50 mg 1 p.o. q.d., #30 with 3 refills. 7. Follow up with me in 10 days, at which time we will consider  adjusting both    the antidepressant and opiates as needed.  His opiates were thrown away by    his sister last week. DD:  03/02/01 TD:  03/02/01 Job: [id number]

## 2010-12-26 NOTE — H&P (Signed)
[facility]  Patient:    Peter Terry                       MRN: [id number] Proc. Date: 10/19/00 Adm. Date:  [id number] Attending:  Thyra Breed CC:         Donzetta Sprung. Roney Jaffe., MD   History and Physical  NEW PATIENT EVALUATION:  HISTORY OF PRESENT ILLNESS:  Peter Terry is a very pleasant 55 year old gentleman who was sent to [place] by Dr. Ronney Lion for evaluation and chronic pain management of his low back pain syndrome.  The patient has a history of a work-related injury on October 14, 1999 for which he was initially followed by Dr. Samuella Bruin and treated conservatively with analgesics, tricyclics in the form of Elavil and Neurontin. He underwent an MRI the results of which were not available to me and was placed in physical therapy. He was sent to Dr. Jeral Fruit who repeated his MRI and did not advocate surgery but sent him through a series of three epidural steroid injections. The patient got no significant lasting relief from any of his injections and the third one exacerbated his discomfort. Dr. Jeral Fruit in follow-up to this advised a workers compensation carrier that he had nothing further to offer.  He was seen by Dr. Wynetta Emery and in October underwent what sounds like a diskectomy. He did remarkably well postoperatively developing more movement and felt really good and was in reconditioning. Suddenly in mid January, he had exacerbation of his discomfort which was the same as previously with pain described as a squeezing type discomfort across his lower back onto the left lower extremity and into the foot with numbness of the foot. He was reevaluated by Dr. Wynetta Emery and an MRI was obtained on September 23, 2000 which showed focal annular disk bulge at L4-5 and broad based annular disk bulge at L5-S1 with enhancing scar to the left S1 nerve root. The patient was subsequently seen with ongoing increasing discomfort. He was treated with Hydrocodone, ice  and relaxation which partially reduced his discomfort. He was taking up to six tablets of Hydrocodone 5/500 mg per day.  He noted that he is developing ongoing problems with prominent sleep difficulties and difficulties coping with his pain. He has had difficulties with short temper especially in dealing with his son -- he is a single parent. He notes that he has exacerbations in his pain with light activity around the house or walking any short distance. As he puts it he has serious pain with this. Sitting long or sleeping will increase his discomfort. He denied bowel or bladder incontinence and states that he occasionally has weakness of his lower extremities.  He underwent a discogram on Friday which showed concordant pain at L5-S1 with opening pressure of 6 with 1 cc of contrast pressure and went to 20 and the patients pain recurred. He had spread throughout the nuclear and annular regions circumferentially with a tear with extrusion. It had extruded towards the adjacent left S1 nerve root. He is scheduled to see Dr. Wynetta Emery later this week to discuss possible surgical intervention.  He is not currently in physical therapy.  CURRENT MEDICATIONS:  He is currently taking Hydrocodone and Aleve occasionally.  ALLERGIES:  No known drug allergies.  FAMILY HISTORY:  Positive for diabetes mellitus, coronary artery disease, hypertension and high cholesterol.  ACTIVE MEDICAL PROBLEMS:  Hypertension for which he is not currently taking medications.  PAST SURGICAL HISTORY: 1.  Appendectomy. 2. Back surgery.  SOCIAL HISTORY:  The patient is a 1-1/2 pack per day smoker. He does not drink alcohol. 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 He is originally from [place].  REVIEW OF SYSTEMS:  GENERAL:  Negative.  HEAD:  Negative.  EYES:  Significant for occasional blurred vision. NOSE, MOUTH AND THROAT:  Significant for a  day where he had some discomfort in his throat to swallowing with exacerbation of neck discomfort.  EARS:  Negative.  PULMONARY:  Negative.  CARDIOVASCULAR: Significant for at least 1 year of hypertension.  GI:  History of constipation which is helped by laxatives, otherwise negative.  GU:  Negative. MUSCULOSKELETAL/NEUROLOGIC:  See history of present illness. No history of seizure or stroke. HEMATOLOGIC:  Negative.  CUTANEOUS:  Negative.  ENDOCRINE: Negative.  PSYCHIATRIC:  Positive for reactive depression. ALLERGIES/IMMUNOLOGIC:  Negative.  PHYSICAL EXAMINATION:  VITAL SIGNS:  Blood pressure 151/99, heart rate 76, respiratory rate 20, oxygen saturations 98%, temperature 98.1. Pain level 8:10.  GENERAL:  This is a pleasant obese male in in no acute distress.  HEENT:  Head was normocephalic and atraumatic.  Extraocular movements intact with conjunctivae and sclerae clear. He does have some amblyopia of the right eye.  Nose patent. Nares clear. Oropharynx free of lesions.  NECK:  Demonstrated good range of motion with carotids 2+ and symmetric without bruit.  LUNGS:  Clear.  CARDIOVASCULAR:  Heart was regular rate and rhythm.  ABDOMEN:  Obese.  GU/RECTAL:  Exams not performed.  BACK:  Well healed surgical scar at the base of his back with no increased pain with hyperextension with forward flexion to about 30 degrees. He has pain that radiates out into the left lower extremity. Straight leg raise signs were positive at about 30 degrees on the left side.  EXTREMITIES:  No cyanosis, clubbing, or edema with radial pulses and dorsalis pedis pulses 2+ and symmetric.  NEUROLOGICAL:  The patient was oriented to person, place, time and reason for visit. Cranial nerves II-XII are grossly intact. Deep tendon reflexes were symmetric in the upper extremities, 2+ and symmetric at the knees, 2+ at the right ankle, absent at the left ankle. Sensory exam revealed attenuated pin prick over  the lateral aspect of the left foot over toes 3, 4 and 5 with  intact vibratory sense. He exhibited mild plantar flexion weakness of the left foot relative to the right. Coordination was grossly intact.  IMPRESSION: 1. Low back pain syndrome with history of work-related injury dating    the onset with subsequent diskectomy with good response up until    mid January with abrupt increasing onset of pain again in similar    distribution with discogram showing a positive concordant response    at L5-S1 with leakage around the left nerve root. 2. Hypertension. 3. History of amblyopia of right eye.  DISPOSITION: 1. I advised the patient that I encourage follow-up with Dr. Wynetta Emery    for discussions of surgical intervention. 2. I advised him that he would probably benefit from some pain coping    skills and would recommend psychologic counseling which he is very    receptive for. 3. Treatment-wise, we discussed opiates, tricyclics and sodium channel    stabilizers as potential treatment options. I advised him not knowing    whether he is having surgery or not, we would just adjust his opiates.    Since  he does have chronic pain, I feel like short acting opiates are    probably not going to be in his best interest and recommended that we    try OxyContin 20 mg one p.o. b.i.d. He was advised of the potential    side effects and risks of this medication and will sign a controlled    substance contract. In addition, I will go ahead and give him some    Elavil 10 mg at night to help with his sleep patterns. 4. He needs to be seen for his high blood pressure at some point. 5. I gave him a prescription for 60 tablets of OxyContin and for 30    tablets of Elavil with six refills. 6. I advised that I needed to see him back follow-up with me in four    weeks in order to manage his pain. 7. Surgical intervention may allow [place] to taper quickly off the OxyContin    after the postoperative recovery  phase. DD:  10/19/00 TD:  10/19/00 Job: [id number]

## 2010-12-26 NOTE — Op Note (Signed)
[place]  Patient:    Peter Terry, Peter Terry                         MRN: [id number] Proc. Date: 02/08/01 Attending:  Garlon Hatchet., M.D.                           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  PROCEDURES: 1. Decompressive laminectomy at L4-5 and L5-S1. 2. Diskectomy at L4-5 and L5-S1 with resection of scar around the left S1    nerve root. 3. Interbody fusion at L4-5 and L5-S1 using 10 x 26 mm Tangent allograft, two    at L4-5 and two at L5-S1. 4. Posterolateral arthrodesis L4 to S1. 5. Pedicle screw placement, L4, L5, and S1, using the M10 CD Horizon system    with 6.5 x 40 pedicle screws inserted at L4 and L5 and 6.5 x 35 at S1, and    two 70 mm rods and one cross-link. 6. Open reduction of spinal deformity. 7. Placement of a medium Hemovac drain.  SURGEON:  Garlon Hatchet., M.D.  FIRST ASSISTANT:  Julio Sicks, M.D.  ANESTHESIA:  General endotracheal.  CLINICAL NOTE:  The patient is a very pleasant 55 year old gentleman who a year ago underwent a lumbar laminectomy at L5-S1 for a left S1 radiculopathy. Patient did very initially and then had recurrence of his back and leg pain. The patient failed all modalities of conservative treatment with anti-inflammatories, physical therapy, and patient got progressively worse with escalating use of narcotics.  Preoperative imaging, including repeat MRI, myelogram, and discogram, showed severe annular tears and cord and discogenic mechanical back pain at L4-5 and L5-S1, and the patient was extensively counseled on the risks of two-level interbody fusion and understands and agrees to proceed forward.  DESCRIPTION OF PROCEDURE:  The patient was brought in the OR and induced under general anesthesia, positioned prone on the Wilson frame.  His back was prepped and  draped in sterile fashion.  A midline incision was made centered and extending his previous incision after infiltration of 10 cc of lidocaine with epinephrine.  Bovie electrocautery was used to take it down to the subcutaneous tissue, and subperiosteal dissection was carried out on the laminae of L3, L4, L5, and S1 bilaterally.  Then attention was taken to avoid the scar tissue on the left side where the previous laminotomy had begun. Attention was first taken to the L4 lamina.  The spinous process and lamina and the medial aspect of the facet complex were removed with a combination of Leksell rongeurs, high-speed drill, and 3 and 4 mm Kerrison punches, exposing the thecal sac, and this was also carried out on the right side at L5.  Then attention was taken to dissect away the scar.  There was some element of scar tissue localized over the left S1 nerve root.  This was teased away using a combination of 4 Penfield and microdissection techniques, and the scar tissue was removed and the remainder of the nerve root was radically decompressed. At the end of the decompression, the L4, L5, and S1 nerve roots were all radically decompressed throughout their foramina, and the large component of medial facet complex at 4-5 and 5-1 were then removed to obtain a lateral enough exposure for placement of the  interbody grafts.  Then attention was taken to allograft placement.  The DErrico nerve root retractor was used to reflect the L5 nerve root medially in the L4-5 disk space.  The annulotomy was made with an 11 blade scalpel.  Pituitary rongeurs were used to clean out the interspace.  Then using a medium-sized distractor using a 9 distractor was inserted in the patients right side, then subsequently the disk space was cleaned out on the patients left side.  In a similar fashion, the 10 distractor and subsequently an 11 distractor was inserted over on the right side.  Then attention was taken to the  left side.  The medium cutter and chisel were used to prepare the interspace for arthrodesis, and a 10 x 26 mm Tangent allograft was inserted using fluoroscopy to confirm depth and location of all steps in the process.  Then on the patients right side, the disk space was cut and chiseled as well with the medium-sized cutters, and the autograft was packed against the allograft on the patients left side, and then the 10 x 26 autograft was inserted on the patients right side.  This was all performed in a similar fashion at L5-S1, again resecting around the scar tissue and removing the disk space.  Both disk spaces, especially L5-S1, were noted to be severely collapsed, and the S1 nerve root had been severely compressed by a combination of the collapse and scar tissue.  Both Tangent allografts were inserted in similar fashion at L5-S1, and attention was taken to pedicle screw placement.  Using the high-speed drill and fluoroscopy, the pilot holes were drilled in the pedicle and the awl was used to cannulate the pedicle.  This was probed and tapped with a 5.5 tap, and the 6.5 x 40 screws were inserted at L4 and L5 sequentially and bilaterally in a similar fashion, and 6.5 x 35 screws were inserted at S1.  Seventy millimeter rods were cut, sized, and selected, and then the wound was copiously irrigated and meticulous hemostasis was maintained.  The TPs and lateral aspect of the facet complexes were decorticated.  The remainder of the autograft harvested during the decompression was used to pack along the lateral gutter walls over the TPs and lateral facet complexes, and then the rods were inserted and tightened down. A cross-link was then inserted.  Postop x-ray confirmed good localizaiton of the screws and bone grafts, and then Gelfoam was overlaid on top of the dura. A medium Hemovac drain was placed, and postoperative x-ray confirmed good reapproximation of normal disk height, or at least  two-thirds of normal disk height, and then the fascia was closed with 2-0 interrupted Vicryl, subcutaneous tissue was closed with 2-0 interrupted Vicryl, skin was closed  with a running 4-0 subcuticular, and benzoin and Steri-Strips were applied. The patient went to recovery in stable condition.  At the end of the case, all needle counts, sponge counts correct. DD:  03/17/01 TD:  03/17/01 Job: [id number]

## 2010-12-26 NOTE — H&P (Signed)
[facility]  Patient:    Peter Terry, Peter Terry                       MRN: [id number] Adm. Date:  [id number] Attending:  Thyra Breed CC:         Work[employer]  [employer]. Roney Jaffe., M.D.   History and Physical  FOLLOW-UP EVALUATION  [name] comes in for follow-up evaluation of his low back pain on the basis of degenerative disk disease with positive discograms.  Since his last evaluation he saw Dr. Wynetta Emery and has been scheduled for surgical intervention next month. He is doing better with his current medications with OxyContin 40 mg two in the morning and one at midday and the Elavil.  He stopped smoking.  PHYSICAL EXAMINATION:  VITAL SIGNS:  Blood pressure 136/79, heart rate is 91, respiratory rate is 12, O2 saturations 95%.  NEUROLOGIC:  Deep tendon reflexes are 2+ and symmetric at the knees, 2+ at the right ankle, 1+ at the left ankle.  He has a positive straight leg raise on the left side.  IMPRESSION: 1. Lumbar degenerative disk disease with positive discograms. 2. Hypertension. 3. Amblyopia.  DISPOSITION: 1. Continue with current dose of OxyContin 40 mg two in the morning and one    in the evening, #90, with no refill. 2. Continue with Elavil 10 mg one p.o. q.p.m. 3. Remain off smoking. 4. Follow up with me in eight weeks. 5. I have written him another prescription to continue seeing Dr. Jerrye Beavers for pain coping skills. DD:  01/11/01 TD:  01/11/01 Job: [id number]

## 2010-12-26 NOTE — H&P (Signed)
[facility]  Patient:    Peter Terry, Peter Terry                       MRN: [id number] Adm. Date:  [id number] Attending:  Thyra Breed CC:         Donzetta Sprung. Roney Jaffe., M.D.             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  The patient is currently on OxyContin 40 mg; he takes two in the morning and states that that is all he takes during the day.  It helps during the day and gets his pain down to 3/10, but by the next day, it is back up to 8/10.  He found the Zyban to be intolerable, but he has been able to get his smoking down to a half pack per day.  He currently is only taking the OxyContin.  PHYSICAL EXAMINATION:  VITAL SIGNS:  Blood pressure is 141/97.  Heart rate is 96.  Respiratory rate is 18.  O2 saturation is 94%.  Pain level is 8/10.  NEUROLOGIC:  Deep tendon reflexes were 2+ and symmetric at the knees, 2+ at the right ankle and 1+ at the left ankle.  BACK:  He had a positive straight leg raise sign on the left side at 80 degrees.  IMPRESSION: 1. Low back pain syndrome with positive discograms. 2. Hypertension. 3. Amblyopia.  DISPOSITION: 1. Increase OxyContin to 40 mg, two in the morning and one in the evening. 2. Restart Elavil at 10 mg one p.o. q.p.m. 3. Remain off Zyban. 4. Stop smoking. 5. Follow up with me in four weeks. 6. The patient was advised that he needs to continue with pain-coping skills    management with Dr. Jerrye Beavers and I will forward a copy of this to    workers compensation. DD:  12/16/00 TD:  12/17/00 Job: [id number]

## 2010-12-26 NOTE — H&P (Signed)
[facility]  Patient:    Peter Terry, Peter Terry                       MRN: [id number] Adm. Date:  [id number] Attending:  Thyra Breed CC:         Garlon Hatchet., M.D.                         History and Physical  HISTORY OF PRESENT ILLNESS:  Tra comes in for a followup evaluation of his low-back pain syndrome on the basis of work-related injury with an L5-S1 positive discogram and L4-5 positive.  He has seen Dr. Wynetta Emery back, who does feel as though he needs surgical intervention but wished to try and get him off cigarettes before proceeding with effusion and I am in concurrence with this.  Since his last evaluation, the patient has noted incomplete pain relief.  He notes that he is still breaking through fairly significantly.  He is not sleeping well.  He has been able to reduce his cigarette consumption down 75% but needs help getting through the last amount.  He continues to smoke 1/2 pack per day.  CURRENT MEDICATIONS: 1. Amitriptyline. 2. OxyContin.  PHYSICAL EXAMINATION:  NEUROLOGIC:  The patient demonstrated positive straight leg raise sign at the left side.  His deep tendon reflexes were 2+ and symmetric at the knees, 2+ at the right ankle, and absent at the left ankle.  IMPRESSION: 1. Low-back pain with history of work-related injury and positive discograms. 2. Hypertension. 3. Amblyopia.  DISPOSITION: 1. Increase OxyContin to 40 mg 1 p.o. b.i.d. 2. Discontinue Elavil. 3. Zyban 150 mg 1 p.o. q.d. x 7 days and then 1 p.o. b.i.d. #60 with two    refills. 4. I feel that he would benefit from seeing Dr. Jerrye Beavers for pain coping    skills and help with cessation of smoking. 5. Follow up with me in four weeks. DD:  11/16/00 TD:  11/16/00 Job: [id number]

## 2010-12-26 NOTE — H&P (Signed)
[facility]  Patient:    Peter Terry, Peter Terry                       MRN: [id number] Adm. Date:  [id number] Attending:  Devoria Albe CC:         Donzetta Sprung. Roney Jaffe., M.D.   History and Physical  FOLLOWUP EVALUATION:  [name] comes in for followup evaluation one week after surgery.  He states that he has much less pain following the surgery but is very sleepy from the medications he is currently taking.  He rates his pain at 8/10.  He is very sleepy and he presents with a friend, who did most of the talking for him.  EXAMINATION:  VITAL SIGNS:  Blood pressure is 126/74.  Heart rate is 98.  Respiratory rate is 22.  O2 saturation is 97%.  Pain level is 8/10.  NEUROLOGIC:  He has symmetric deep tendon reflexes in the lower extremities. He shows good healing over his incisional site.  CURRENT MEDICATIONS:  Percocet, Cipro, diazepam and Prozac as well as OxyContin 40 mg three tablets per day.  IMPRESSION: 1. Status post surgical intervention for lumbar degenerative disk disease with    what appears to be a positive outcome. 2. Sedation from his medications. 3. Hypertension. 4. Reactive depression.  DISPOSITION: 1. Reduce OxyContin to 20 mg, 2 in the morning and 2 in the evening for    5 days, then 1 p.o. t.i.d., #80. 2. Stop Elavil. 3. Remain off Prozac. 4. Hold on Valium. 5. Hold Percocet except for breakthrough pain. 6. Follow up with me in 7 to 10 days to reassess to make sure that he is not    getting oversedated from his medications. DD:  02/22/01 TD:  02/22/01 Job: [id number]

## 2010-12-26 NOTE — Op Note (Signed)
. [facility]  Patient:    Peter Terry, Peter Terry                       MRN: [id number] Proc. Date: 03/18/00 Adm. Date:  [id number] Attending:  Donalee Citrin P                           Operative Report  PREOPERATIVE DIAGNOSIS:  L5-S1 HNP and left S1 radiculopathy.  PROCEDURE:  Lumbar hemilaminectomy and microdiskectomy at L5-S1 with microscopic dissection of the S1 nerve root and microscopic diskectomy.  SURGEON:  Donzetta Sprung. Roney Jaffe., MD  ASSISTANT:  Reinaldo Meeker, M.D.  ANESTHESIA:  General endotracheal anesthesia.  IV FLUIDS:  1800 cc.  ESTIMATED BLOOD LOSS:  Between 50 and 100 cc.  INDICATIONS:  The patient is a 55 year old gentleman who has had six months of severe pain going down his left leg and the sole of his foot with numbness and tingling in the same distribution.  PREOPERATIVE ASSESSMENT:  An MRI revealed L5-S1 disk.  He has done through trials of physical therapy, epidural steroid injections, and pain has gotten progressively worse.  The patient now presents for diskectomy.  DESCRIPTION OF PROCEDURE:  The patient was brought into the OR and was induced under general anesthesia, he was positioned prone on the Wilson frame.  His back was prepped and draped in the usual sterile fashion.  Preoperative x-ray confirmed the L5-S1 interspace and incision was centered over this with 20 blade scalpel.  Incision was made.  Bovie electrocautery was used to take down subcutaneous tissue and fat down to the fascia.  The fascia was then divided on the left side.  Subperiosteal dissection was carried down on the left in the L5-S1 lamina, then the retractor was placed using a 3 and 4 mm Kerrison punch.  The laminotomy was initiated.  Initially on the inferior aspect of L5 and then at S1.  The ligamentum flavum was then incised and carried up cephalocaudal.  The lateral gutter work was continued with a 3 mm Kerrison punch.  The S1 nerve root was identified  and foraminotomy was continued with 3 mm Kerrison punch at the S1 nerve root.  The operating microscope was draped, was brought into the field, and a microscopic dissection of the S1 nerve root was continued to be dissected out and foraminotomy was continued.  The interspace was identified.  The epidural veins were coagulated with bipolar electrocautery.  After the epidural veins had been coagulated, the interspace, it was noted to be a bulging protrusion compressing against the axel of the left S1 nerve root.  The 11 blade scalpel was used to incise the annulus and ligament and some disk material was teased out from inferior to the interspace underneath the axel of the S1 nerve root as well as medially in the interspace using pituitary rongeurs, downgoing Epstein curet, and nerve hook.  At the end of removing this bulging fragment, the S1 nerve root was noted to be much more loose and decompressed.  The remainder of the interspace was cleaned out using pituitary rongeurs.  Using a downgoing Epstein curet, the remainder of the area inferior to the interspace and underneath the nerve root was cleaned out and laterally using pituitary rongeurs as well.  At the end of the diskectomy, the thecal sac and the S1 nerve root were noted to be completely decompressed. Angled dural guide was passed underneath  the nerve root and along the foramen medially, there was no other compression noted.  The wound was copiously irrigated.  Meticulous hemostasis was maintained.  Gelfoam was overlayed on top of the dura.  The fascia was closed with 0 Vicryls.  The fat was approximated with two interrupted Vicryls, and the subcutaneous tissue was closed with 2-0 interrupted Vicryls, and the skin was closed with a running 4-0 subcuticular.  Benzoin and Steri-Strips were applied.  The wound was dressed.  The patient went to the recovery room in stable condition.  At the end of the case, all sponge, needle, and  instrument counts were correct. DD:  03/18/00 TD:  03/18/00 Job: [id number]

## 2010-12-26 NOTE — Discharge Summary (Signed)
[facility]  Patient:    Peter Terry, FILTER                       MRN: [id number] Adm. Date:  [id number] Disc. Date: 02/11/01 Attending:  Carolanne Grumbling D                           Discharge Summary  ADMITTING DIAGNOSIS:  Severe mechanical low back pain, L4-5 and L5-S1.  PROCEDURE:  Interbody fusions at L4-5 and L5-S1.  HOSPITAL COURSE:  The patient was admitted and _________ went to the operating room and underwent the aforementioned procedure.  Postoperative, the patient did very well and went to the recovery room and then to the floor.  On the floor, the patient remained difficult with pain control, however, we titrated up his pain medicines and comfortable on postoperative day #1, and remained afebrile.  He was voiding spontaneously on hospital day #1 and his Hemovac drain was able to be taken out on day #2, and the patient was mobilized for physical therapy. He did very well.  His pain became well-controlled and preoperative pain medicine included 40 t.i.d. of OxyContin, and the patient was able to be discharged to home on hospital day #3.  At the time of discharge, the wound was clean and dry and he was afebrile, and tolerating a regular diet, and ambulating.  The patient was discharged home and scheduled to follow up in two weeks.  At the time of discharge, the patient was set up in follow up with me in two weeks or sooner if any change in condition, or if his condition worsens. DD:  03/17/01 TD:  03/17/01 Job: [id number]

## 2010-12-26 NOTE — H&P (Signed)
[facility]  Patient:    Peter Terry, Peter Terry                       MRN: [id number] Adm. Date:  [id number] Attending:  Thyra Breed CC:         Garlon Hatchet., M.D.   History and Physical  FOLLOW-UP EVALUATION:  Peter Terry comes in for follow-up evaluation of his lumbar degenerative disk disease status post surgical intervention and depression. Since his last evaluation, he has done remarkably well with regard to his back discomfort. He is now just taking his OxyContin, maybe one to two tablets per day. He basically only takes it when he has some discomfort; but overall, his pain is markedly improved postoperatively. His depression has improved with increasing the Paxil, but he continues to have some sleep disruptions. He was being seen over at [place] but now will be followed by Dr. Lodema Hong.  PHYSICAL EXAMINATION:  GENERAL:  The patient is in his brace. He is in good spirits.  VITAL SIGNS:  Blood pressure is 154/87, heart rate is 86, respiratory rate is 16, O2 saturation is 97%, pain level is 0/10.  IMPRESSION: 1. Low back pain syndrome on the basis of degenerative disk disease with    marked improvement postoperatively. 2. Depression improving with Paxil.  DISPOSITION: 1. Continue with OxyContin 10 mg one p.o. b.i.d. p.r.n. He does not need a    prescription for this. 2. Continue with Paxil. A prescription was written for 30 mg one p.o. q.d. to    be started after he finishes his current medical prescription. 3. Follow up with me in six weeks. He was encouraged to follow up with Dr.    Lodema Hong for counseling. DD:  03/24/01 TD:  03/24/01 Job: [id number]

## 2011-09-14 ENCOUNTER — Encounter (HOSPITAL_BASED_OUTPATIENT_CLINIC_OR_DEPARTMENT_OTHER): Payer: Self-pay | Admitting: *Deleted

## 2011-09-14 ENCOUNTER — Emergency Department (HOSPITAL_BASED_OUTPATIENT_CLINIC_OR_DEPARTMENT_OTHER)
Admission: EM | Admit: 2011-09-14 | Discharge: 2011-09-14 | Disposition: A | Discharge status: Home / Self Care | Payer: Self-pay | Attending: Emergency Medicine | Admitting: Emergency Medicine

## 2011-09-14 ENCOUNTER — Emergency Department (INDEPENDENT_AMBULATORY_CARE_PROVIDER_SITE_OTHER): Payer: Self-pay

## 2011-09-14 ENCOUNTER — Other Ambulatory Visit: Payer: Self-pay

## 2011-09-14 DIAGNOSIS — F17200 Nicotine dependence, unspecified, uncomplicated: Secondary | ICD-10-CM

## 2011-09-14 DIAGNOSIS — E119 Type 2 diabetes mellitus without complications: Secondary | ICD-10-CM | POA: Insufficient documentation

## 2011-09-14 DIAGNOSIS — Z79899 Other long term (current) drug therapy: Secondary | ICD-10-CM | POA: Insufficient documentation

## 2011-09-14 DIAGNOSIS — R0602 Shortness of breath: Secondary | ICD-10-CM

## 2011-09-14 DIAGNOSIS — I10 Essential (primary) hypertension: Secondary | ICD-10-CM | POA: Insufficient documentation

## 2011-09-14 DIAGNOSIS — E7800 Pure hypercholesterolemia, unspecified: Secondary | ICD-10-CM | POA: Insufficient documentation

## 2011-09-14 DIAGNOSIS — R05 Cough: Secondary | ICD-10-CM

## 2011-09-14 DIAGNOSIS — J40 Bronchitis, not specified as acute or chronic: Secondary | ICD-10-CM | POA: Insufficient documentation

## 2011-09-14 DIAGNOSIS — R079 Chest pain, unspecified: Secondary | ICD-10-CM

## 2011-09-14 DIAGNOSIS — I2510 Atherosclerotic heart disease of native coronary artery without angina pectoris: Secondary | ICD-10-CM

## 2011-09-14 HISTORY — DX: Essential (primary) hypertension: I10

## 2011-09-14 HISTORY — DX: Pure hypercholesterolemia, unspecified: E78.00

## 2011-09-14 LAB — CBC
Hemoglobin: 11.9 g/dL — ABNORMAL LOW (ref 13.0–17.0)
RBC: 4.54 MIL/uL (ref 4.22–5.81)
RDW: 14.9 % (ref 11.5–15.5)

## 2011-09-14 LAB — DIFFERENTIAL
Basophils Relative: 0 % (ref 0–1)
Eosinophils Absolute: 0.5 10*3/uL (ref 0.0–0.7)
Lymphocytes Relative: 19 % (ref 12–46)
Monocytes Absolute: 1 10*3/uL (ref 0.1–1.0)
Monocytes Relative: 11 % (ref 3–12)
Neutro Abs: 6 10*3/uL (ref 1.7–7.7)
Neutrophils Relative %: 65 % (ref 43–77)

## 2011-09-14 LAB — BASIC METABOLIC PANEL
GFR calc Af Amer: 87 mL/min — ABNORMAL LOW (ref >90)
Glucose, Bld: 104 mg/dL — ABNORMAL HIGH (ref 70–99)

## 2011-09-14 IMAGING — CR DG CHEST 2V
2 series · 2 of 2 positions shown · non-contrast
Comparison: [DATE]

CLINICAL DATA: Cough, shortness of breath, hypertension, diabetes

CHEST - 2 VIEW

[w chest pa]
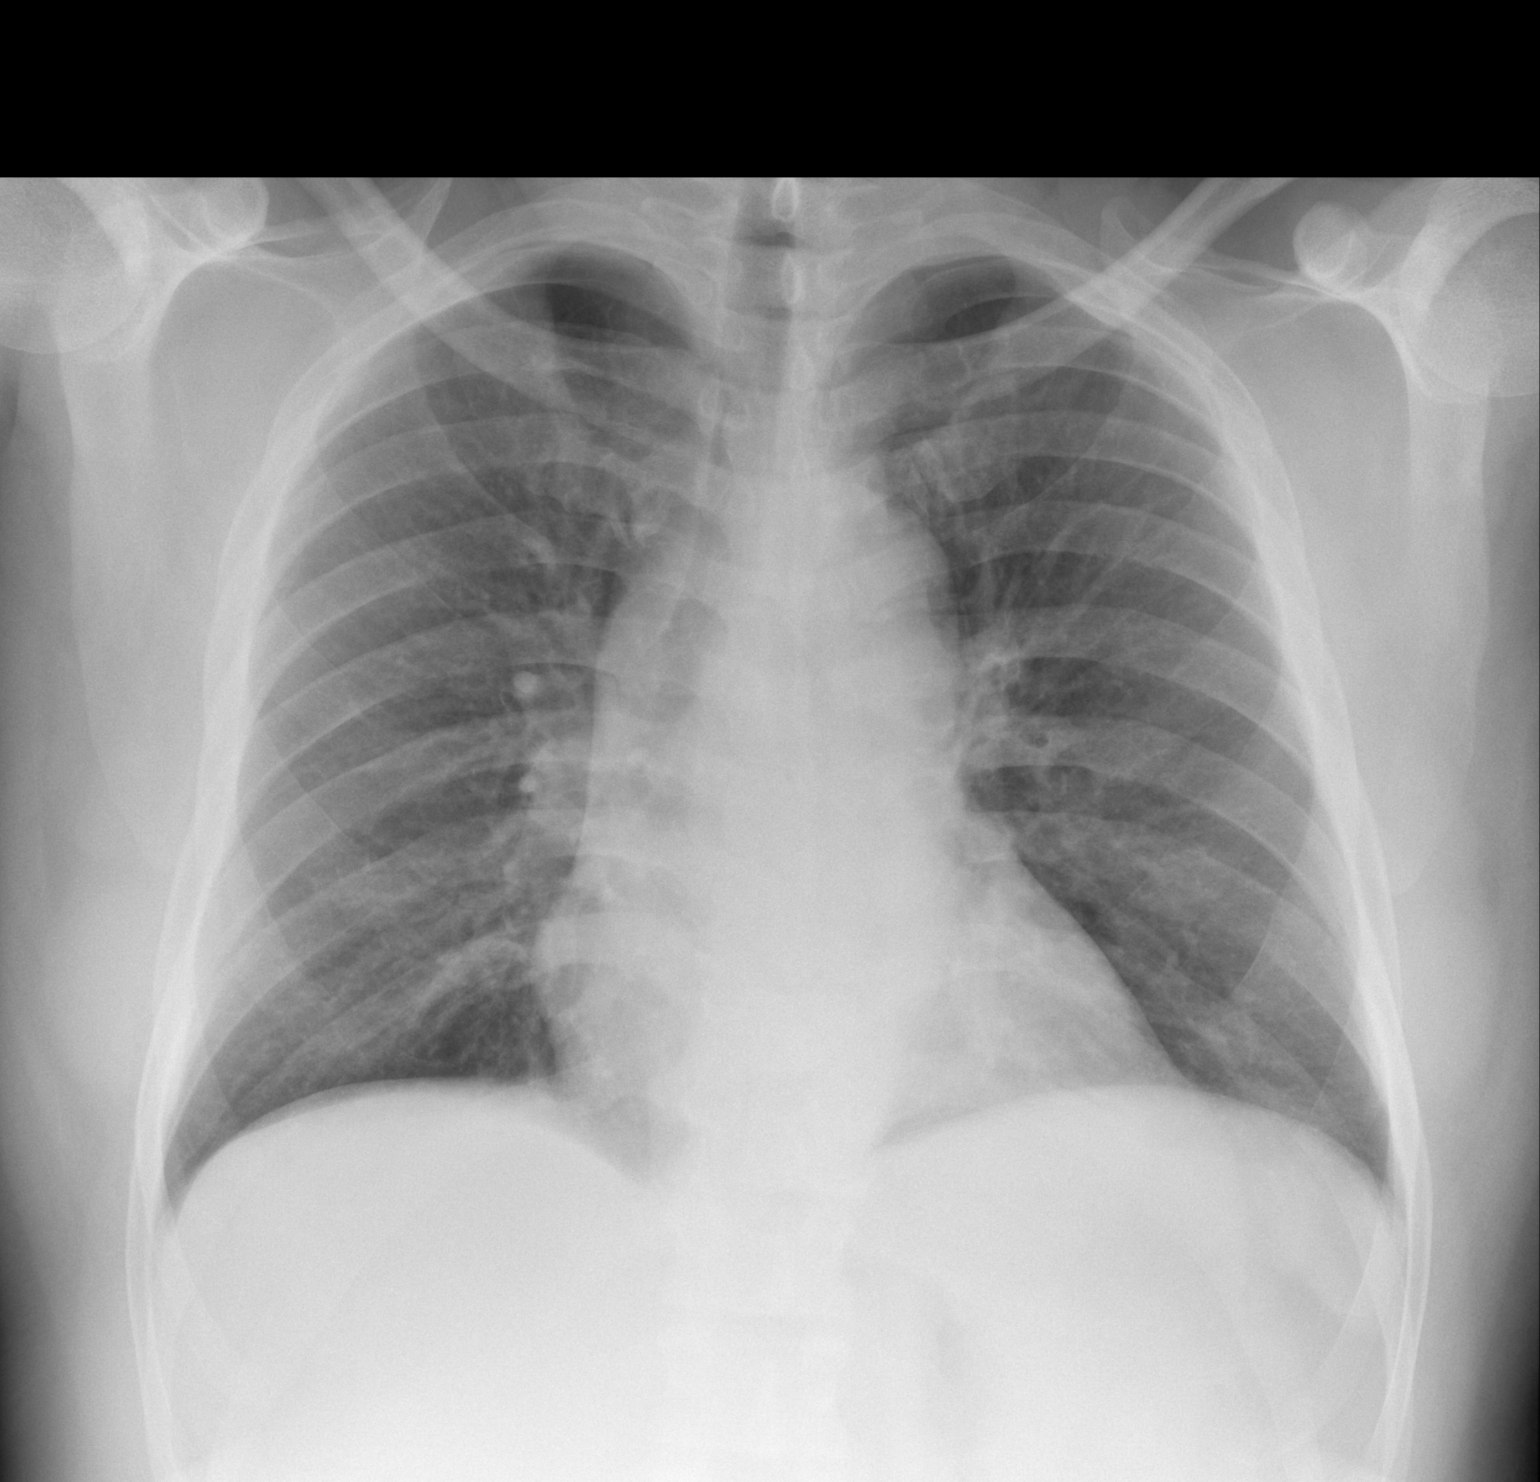

[w chest lat]
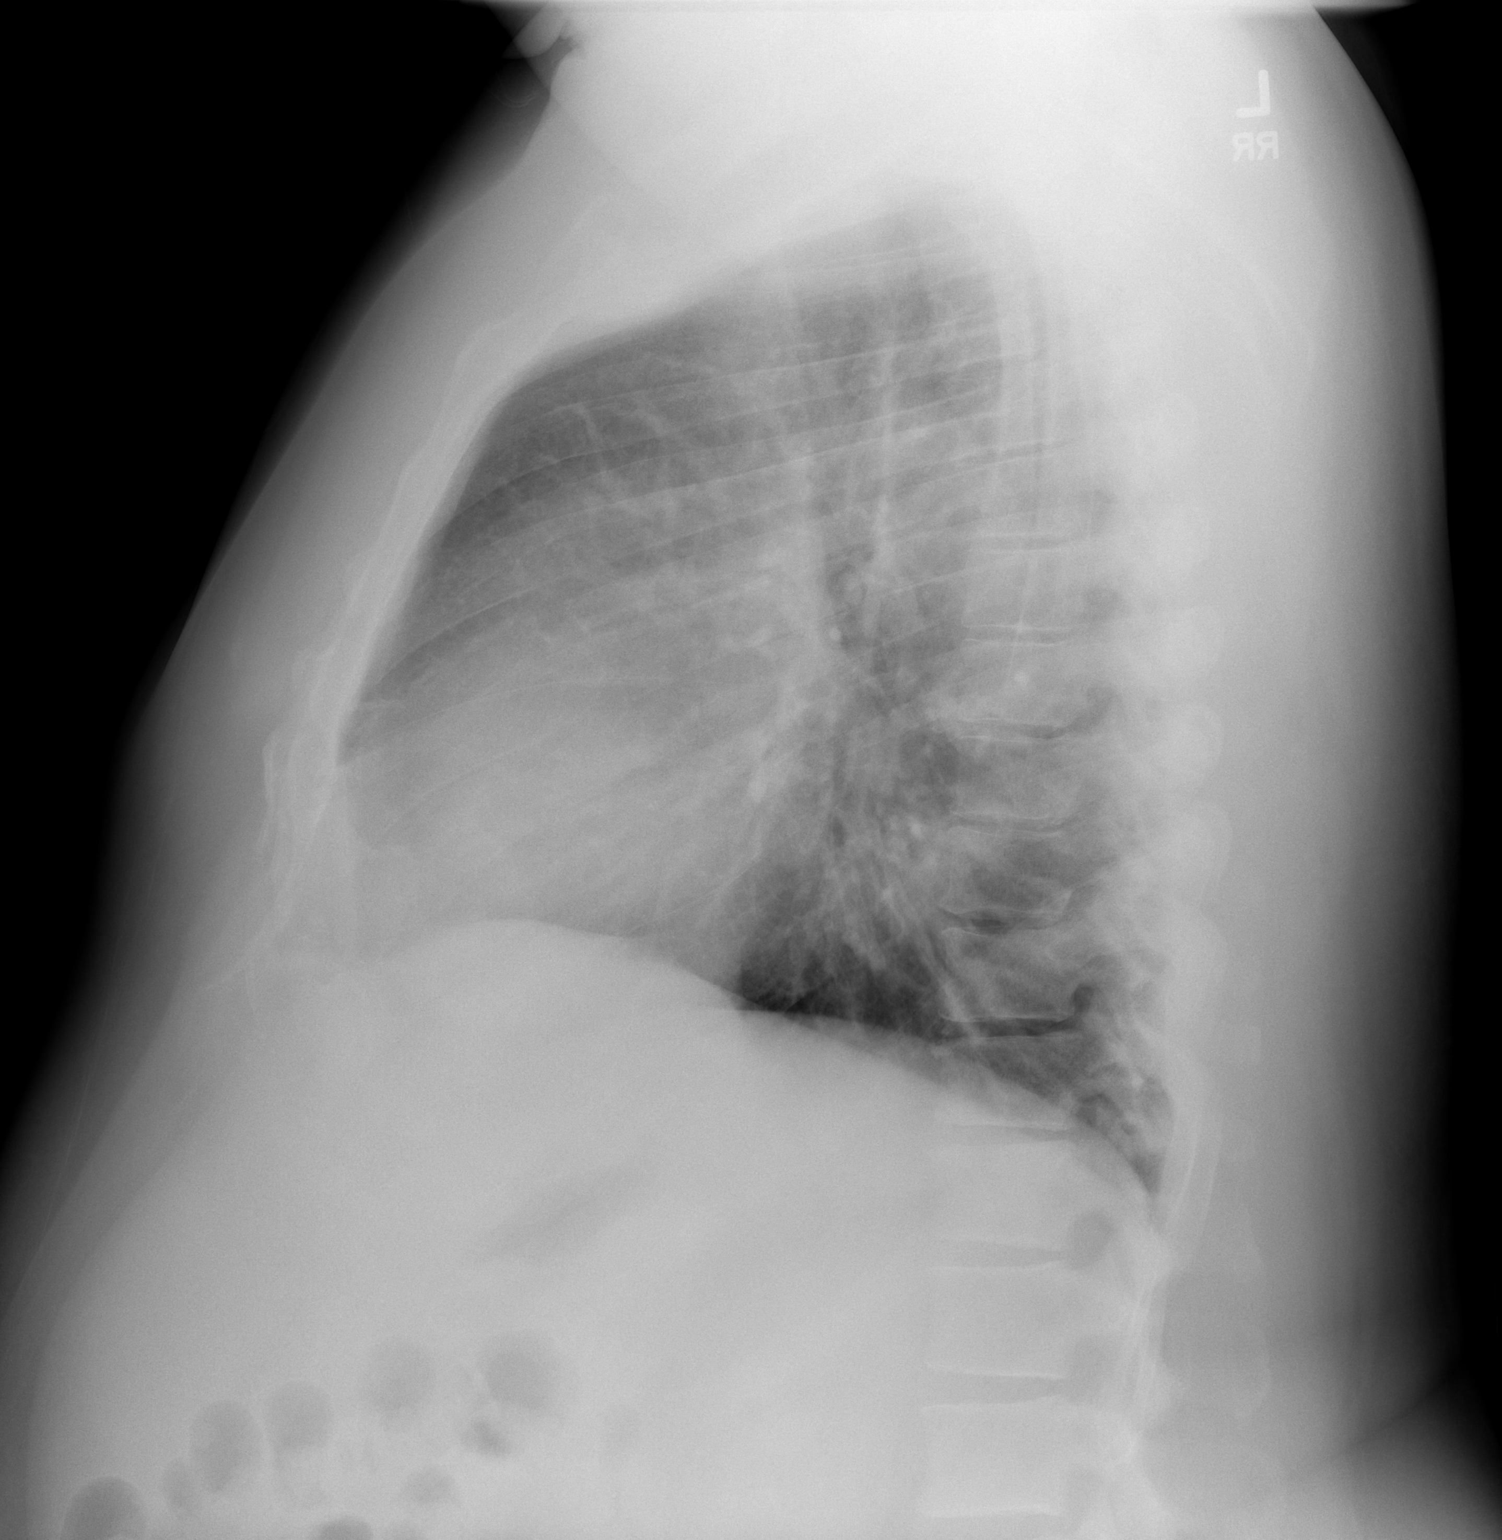

[2 of 2 positions shown; findings below may reference images not displayed]

FINDINGS: Upper-normal size of cardiac silhouette.
Torus aorta.
Slight pulmonary vascular congestion.
Chronic peribronchial thickening.
No acute infiltrate, pleural effusion, or pneumothorax.
No acute osseous findings.
IMPRESSION: Stable exam.
Minimal chronic bronchitic changes.

## 2011-09-14 IMAGING — CT CT ANGIO CHEST
2 of 6 series · 19 of 36 positions shown · IV contrast (APPLIED)
Comparison: Chest radiography same day

CLINICAL DATA: Short of breath.  Chest pain.  Smoking history.

CT ANGIOGRAPHY CHEST
TECHNIQUE: Multidetector CT imaging of the chest using the
standard protocol during bolus administration of intravenous
contrast. Multiplanar reconstructed images including MIPs were
obtained and reviewed to evaluate the vascular anatomy.
Contrast: 80mL OMNIPAQUE IOHEXOL 350 MG/ML IV SOLN

[Series 5: pe 1.0 b25f · axial · 0.68mm/px · z∈[-90,+108]mm · 18 of 220 slices shown]
[im 11/220  lung]
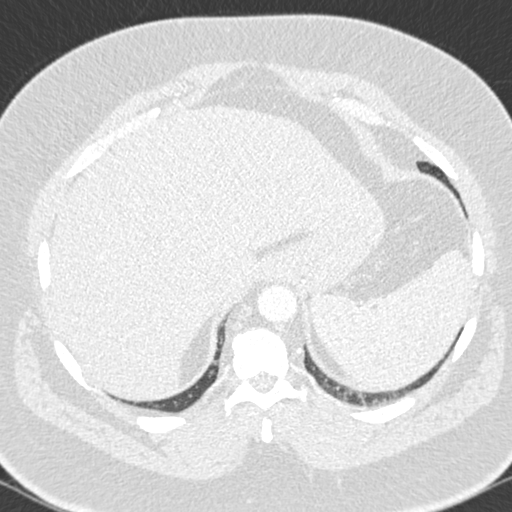
[im 22/220  mediastinal]
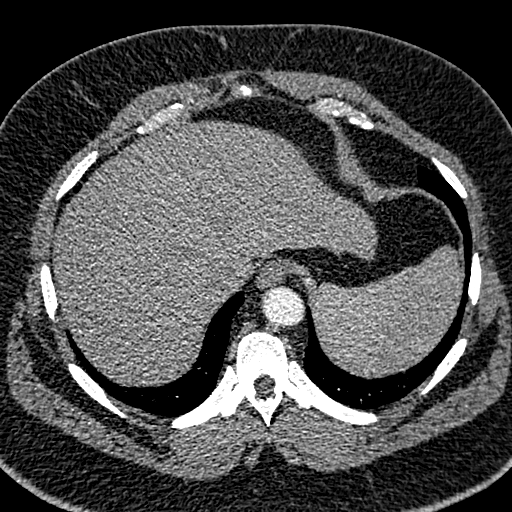
[im 33/220  lung]
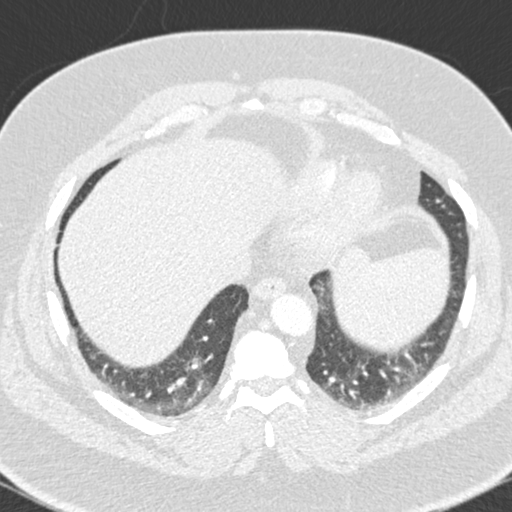
[im 44/220  mediastinal]
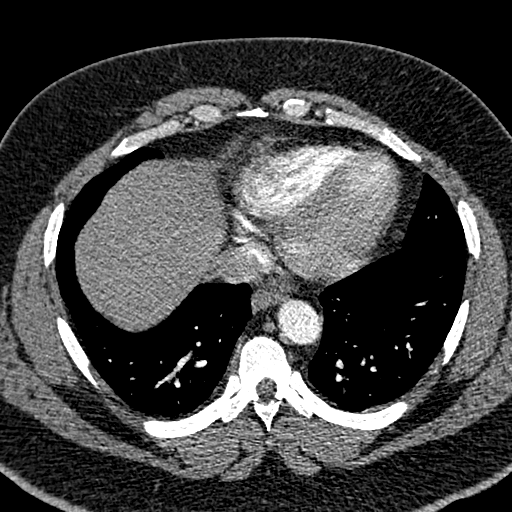
[im 55/220  lung]
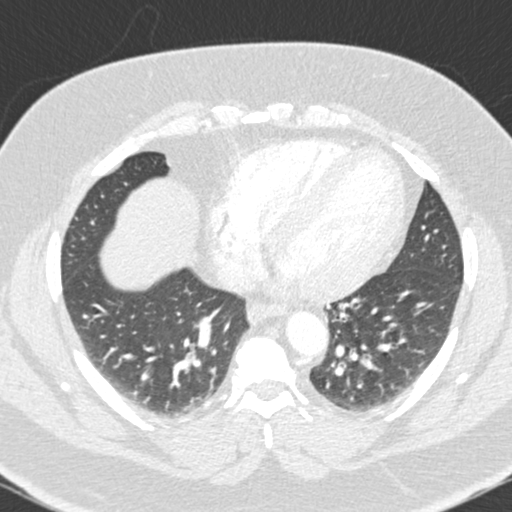
[im 66/220  mediastinal]
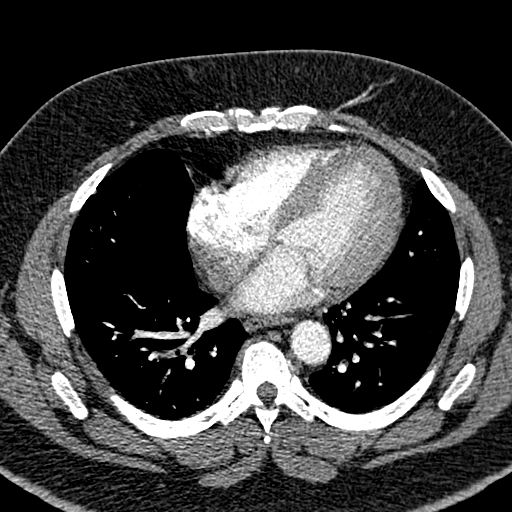
[im 77/220  lung]
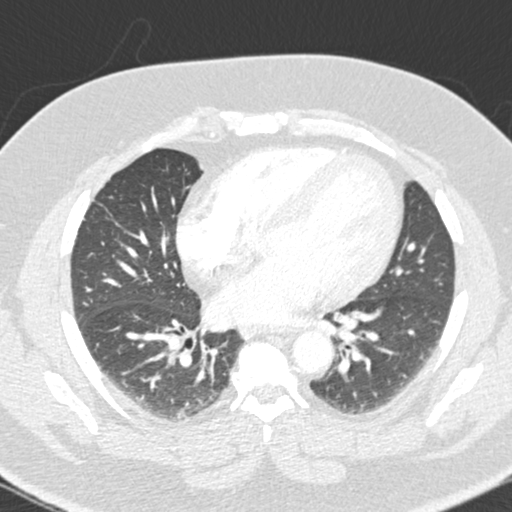
[im 88/220  mediastinal]
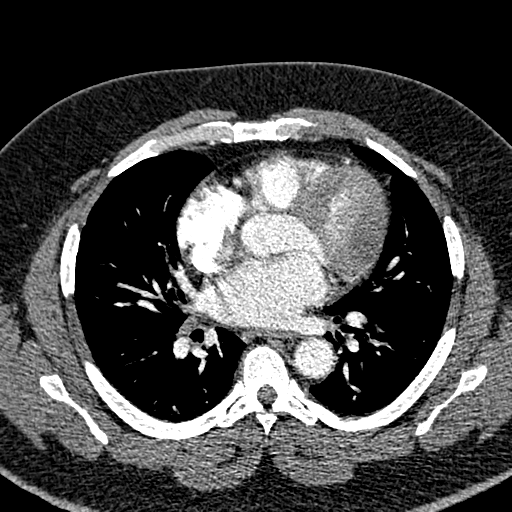
[im 99/220  lung]
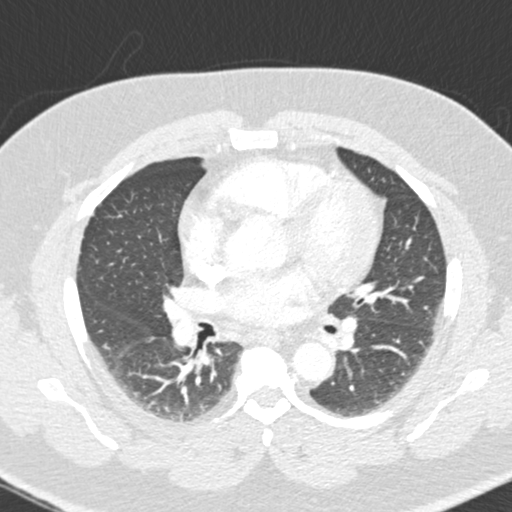
[im 121/220  mediastinal]
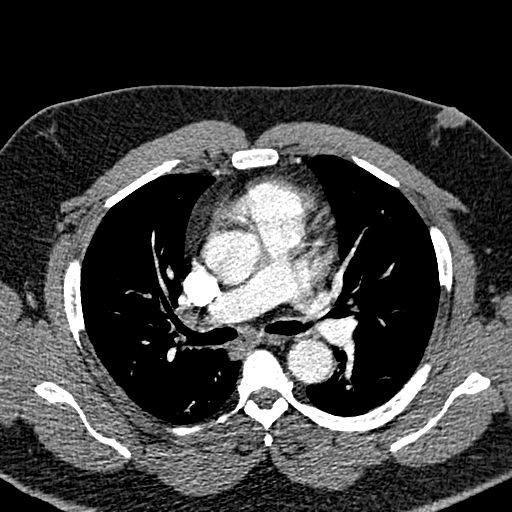
[im 132/220  lung]
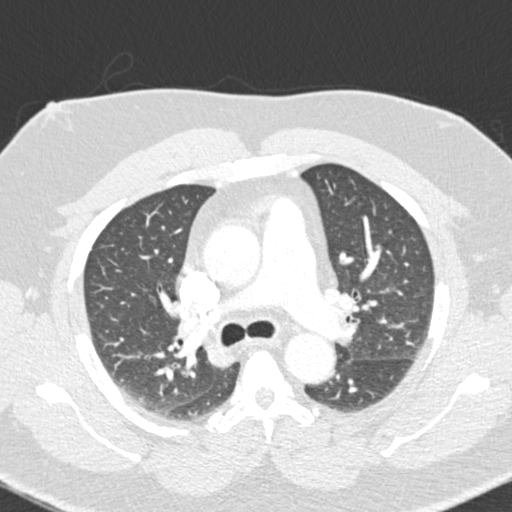
[im 143/220  mediastinal]
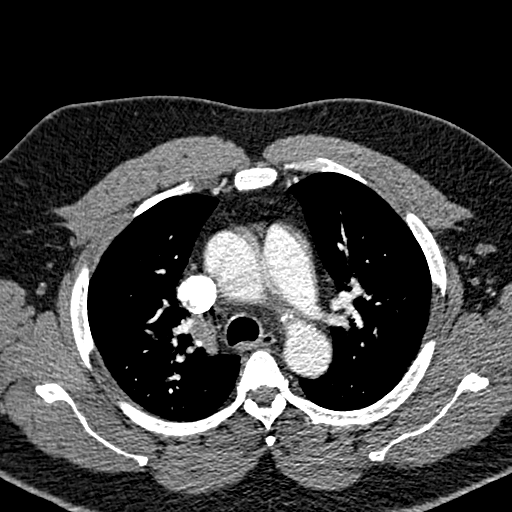
[im 154/220  lung]
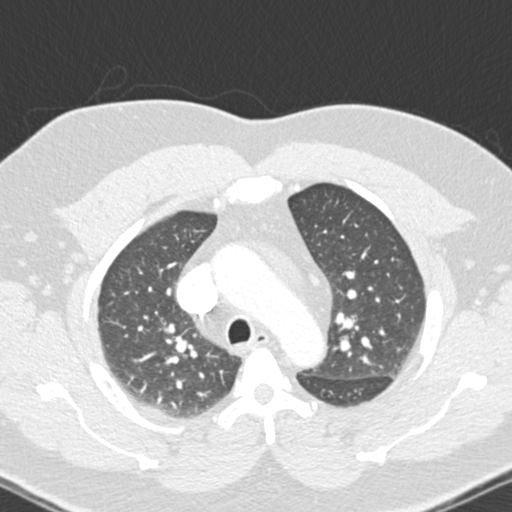
[im 165/220  mediastinal]
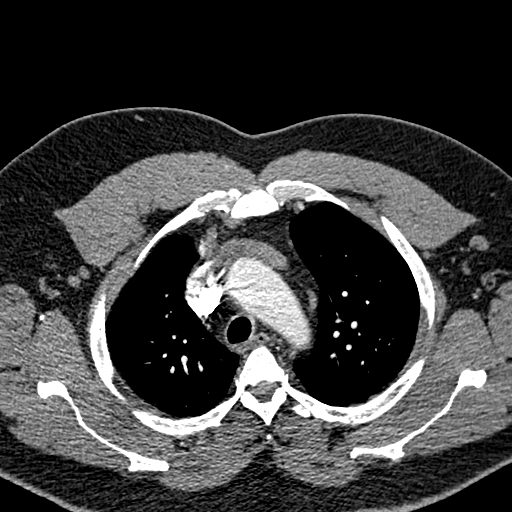
[im 176/220  lung]
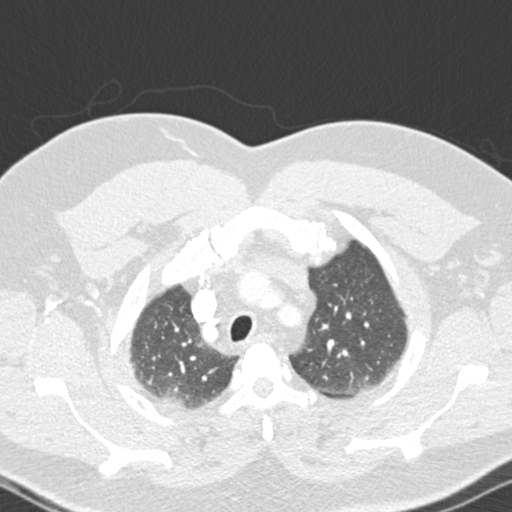
[im 187/220  mediastinal]
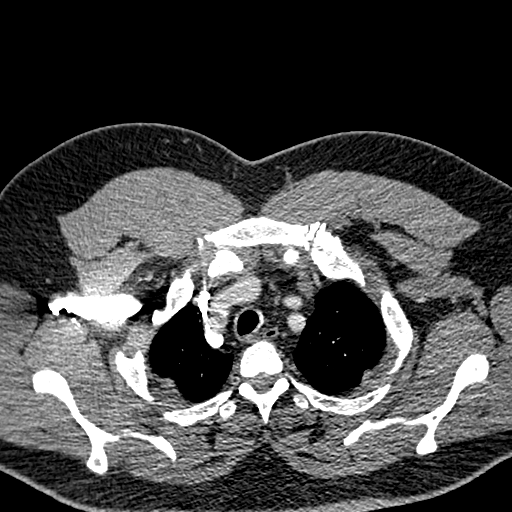
[im 198/220  lung]
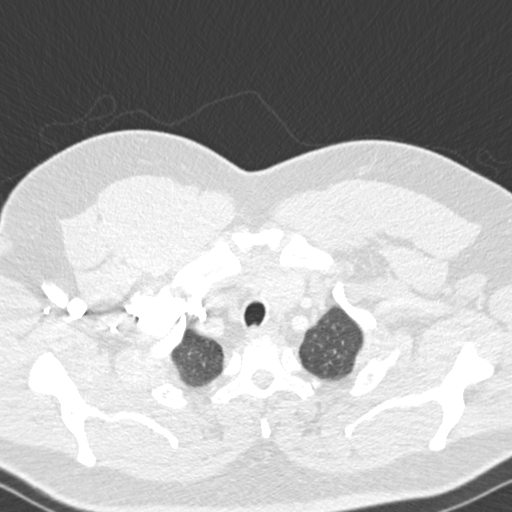
[im 209/220  mediastinal]
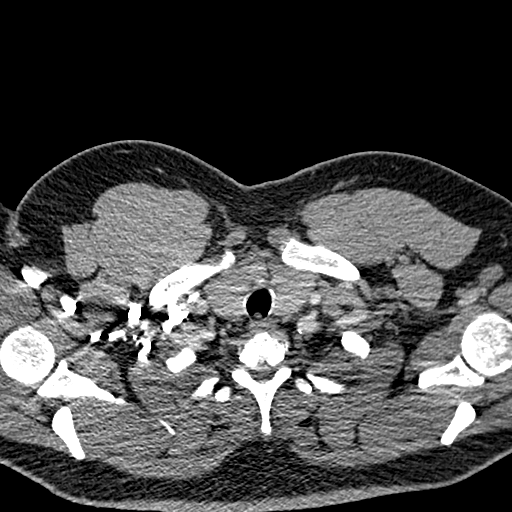

[Series 8: pe 2.0 coronal · coronal · 0.45mm/px · 1 of 151 slices shown]
[im 76/151  mediastinal]
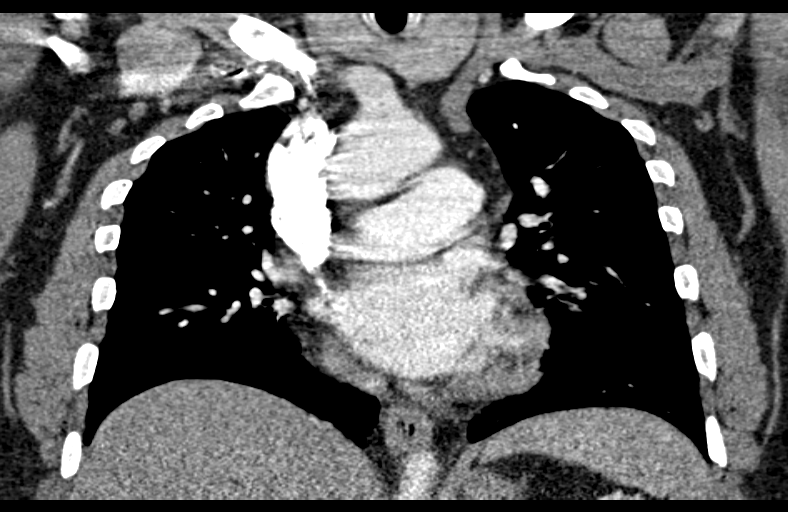

[19 of 36 positions shown; findings below may reference images not displayed]

FINDINGS: Pulmonary arterial opacification is good.  There are no
discernible pulmonary emboli.  The aorta shows mild atherosclerosis
but no aneurysm or dissection.  There is a small amount of coronary
artery calcification.  There is no pleural or pericardial fluid.
There is no evidence of pulmonary infiltrate, mass or collapse.
There are a few small subpleural nodules in the right lung there
are benign.  Scans in the upper abdomen do not show any significant
finding.  No significant bony finding.
IMPRESSION: No pulmonary emboli or other acute chest pathology evident.

The patient does have some coronary artery calcification.

## 2011-09-14 MED ORDER — ALBUTEROL (5 MG/ML) CONTINUOUS INHALATION SOLN
10.0000 mg/h | INHALATION_SOLUTION | Freq: Once | RESPIRATORY_TRACT | Status: AC
  Administered 2011-09-14: 10 mg/h via RESPIRATORY_TRACT
  Filled 2011-09-14: qty 0.5
  Filled 2011-09-14: qty 20

## 2011-09-14 MED ORDER — IOHEXOL 350 MG/ML SOLN
80.0000 mL | Freq: Once | INTRAVENOUS | Status: AC | PRN
  Administered 2011-09-14: 80 mL via INTRAVENOUS

## 2011-09-14 MED ORDER — ALBUTEROL SULFATE (5 MG/ML) 0.5% IN NEBU
INHALATION_SOLUTION | RESPIRATORY_TRACT | Status: AC
  Filled 2011-09-14: qty 1

## 2011-09-14 MED ORDER — AZITHROMYCIN 250 MG PO TABS: 250.0000 mg | ORAL_TABLET | Freq: Every day | ORAL | Status: AC

## 2011-09-14 MED ORDER — IPRATROPIUM BROMIDE 0.02 % IN SOLN
0.5000 mg | Freq: Once | RESPIRATORY_TRACT | Status: AC
  Administered 2011-09-14: 0.5 mg via RESPIRATORY_TRACT

## 2011-09-14 MED ORDER — ALBUTEROL SULFATE HFA 108 (90 BASE) MCG/ACT IN AERS: 2.0000 | INHALATION_SPRAY | RESPIRATORY_TRACT | Status: DC | PRN

## 2011-09-14 MED ORDER — ALBUTEROL SULFATE (5 MG/ML) 0.5% IN NEBU
5.0000 mg | INHALATION_SOLUTION | Freq: Once | RESPIRATORY_TRACT | Status: AC
  Administered 2011-09-14: 5 mg via RESPIRATORY_TRACT

## 2011-09-14 MED ORDER — METHYLPREDNISOLONE SODIUM SUCC 125 MG IJ SOLR
125.0000 mg | Freq: Once | INTRAMUSCULAR | Status: AC
  Administered 2011-09-14: 125 mg via INTRAVENOUS
  Filled 2011-09-14: qty 2

## 2011-09-14 MED ORDER — IPRATROPIUM BROMIDE 0.02 % IN SOLN
RESPIRATORY_TRACT | Status: AC
  Filled 2011-09-14: qty 2.5

## 2011-09-14 MED ORDER — PREDNISONE 50 MG PO TABS: 50.0000 mg | ORAL_TABLET | Freq: Every day | ORAL | Status: DC

## 2011-09-14 NOTE — ED Notes (Signed)
MD at bedside. 

## 2011-09-14 NOTE — ED Provider Notes (Addendum)
History     CSN: [id number]  Arrival date & time 09/14/11  1200   First MD Initiated Contact with Patient 09/14/11 1212      Chief Complaint  Patient presents with  . Shortness of Breath    (Consider location/radiation/quality/duration/timing/severity/associated sxs/prior treatment) HPI  55yoM since with shortness of breath, cough. The patient states that he has been short of breath for the past 3 days. He states he was in onset. He has a cough productive of yellow sputum. He denies sore throat, rhinorrhea, nasal congestion. There've been no fevers or chills. He's heard himself wheezing but does not have a history of wheezing. He is a smoker but no cigarettes since onset of shortness of breath. Denies back pain. He states he has chest soreness from coughing. His pain as a 1/10 at this time. It is worse with breathing and with coughing. Denies h/o VTE in self or family. No recent hosp/surg/immob. No h/o cancer. Denies exogenous hormone use, no leg pain or swelling. He is a Paramedic and drives multiple hours daily  ED Notes, ED Provider Notes from 09/14/11 0000 to 09/14/11 12:23:22       Julieanne Manson, RN 09/14/2011 12:17      Sob x 3 days. Productive cough. Flu 2 months ago and symptoms have never completely resolved.     Past Medical History  Diagnosis Date  . Diabetes mellitus   . High cholesterol   . Hypertension     Past Surgical History  Procedure Date  . Back surgery     No family history on file.  History  Substance Use Topics  . Smoking status: Current Everyday Smoker -- 1.0 packs/day  . Smokeless tobacco: Not on file  . Alcohol Use: No      Review of Systems  All other systems reviewed and are negative.  except as noted HPI   Allergies  Review of patient's allergies indicates no known allergies.  Home Medications   Current Outpatient Rx  Name Route Sig Dispense Refill  . PROVENTIL IN Inhalation Inhale into the lungs.    . NORVASC PO Oral Take by  mouth.    Marland Kitchen CARVEDILOL PO Oral Take by mouth.    Marland Kitchen DOXAZOSIN MESYLATE PO Oral Take by mouth.    . INSULIN GLARGINE 100 UNIT/ML [place] SOLN Subcutaneous Inject into the skin at bedtime.    Marland Kitchen HUMALOG [place] Subcutaneous Inject into the skin.    Marland Kitchen LISINOPRIL PO Oral Take by mouth.    . CRESTOR PO Oral Take by mouth.    . ALBUTEROL SULFATE HFA 108 (90 BASE) MCG/ACT IN AERS Inhalation Inhale 2 puffs into the lungs every 4 (four) hours as needed for wheezing. 1 Inhaler 0  . AZITHROMYCIN 250 MG PO TABS Oral Take 1 tablet (250 mg total) by mouth daily. Take first 2 tablets together, then 1 every day until finished. 6 tablet 0  . PREDNISONE 50 MG PO TABS Oral Take 1 tablet (50 mg total) by mouth daily. 5 tablet 0    BP 156/80  Pulse 86  Temp(Src) 98.4 F (36.9 C) (Oral)  Resp 20  Ht 5\' 10"  (1.778 m)  Wt 280 lb (127.007 kg)  BMI 40.18 kg/m2  SpO2 96%  Physical Exam  Nursing note and vitals reviewed. Constitutional: He is oriented to person, place, and time. He appears well-developed and well-nourished. No distress.  HENT:  Head: Atraumatic.  Mouth/Throat: Oropharynx is clear and moist.  Eyes: Conjunctivae are normal. Pupils  are equal, round, and reactive to light.  Neck: Neck supple.  Cardiovascular: Normal rate, regular rhythm, normal heart sounds and intact distal pulses.  Exam reveals no gallop and no friction rub.   No murmur heard. Pulmonary/Chest: No respiratory distress. He has no wheezes. He has no rales. He exhibits no tenderness.       . Diffuse expiratory wheeze in the right lung fields. Decreased air movement bilaterally.  Abdominal: Soft. Bowel sounds are normal. There is no tenderness. There is no rebound and no guarding.  Musculoskeletal: Normal range of motion. He exhibits no edema and no tenderness.  Neurological: He is alert and oriented to person, place, and time.  Skin: Skin is warm and dry.  Psychiatric: He has a normal mood and affect.    Date: 09/14/2011  Rate: 81   Rhythm: normal sinus rhythm  QRS Axis: normal  Intervals: normal  ST/T Wave abnormalities: normal  Conduction Disutrbances:none  Narrative Interpretation:   Old EKG Reviewed: none available   ED Course  Procedures (including critical care time)  Labs Reviewed  CBC - Abnormal; Notable for the following:    Hemoglobin 11.9 (*)    HCT 34.6 (*)    MCV 76.2 (*)    All other components within normal limits  BASIC METABOLIC PANEL - Abnormal; Notable for the following:    Glucose, Bld 104 (*)    GFR calc non Af Amer 75 (*)    GFR calc Af Amer 87 (*)    All other components within normal limits  DIFFERENTIAL  TROPONIN I  PRO B NATRIURETIC PEPTIDE   Dg Chest 2 View  09/14/2011  *RADIOLOGY REPORT*  Clinical Data: Cough, shortness of breath, hypertension, diabetes  CHEST - 2 VIEW  Comparison: 10/07/2009  Findings: Upper-normal size of cardiac silhouette. Torus aorta. Slight pulmonary vascular congestion. Chronic peribronchial thickening. No acute infiltrate, pleural effusion, or pneumothorax. No acute osseous findings.  IMPRESSION: Stable exam. Minimal chronic bronchitic changes.  Original Report Authenticated By: Lollie Marrow, M.D.   Ct Angio Chest W/cm &/or Wo Cm  09/14/2011  *RADIOLOGY REPORT*  Clinical Data: Short of breath.  Chest pain.  Smoking history.  CT ANGIOGRAPHY CHEST  Technique:  Multidetector CT imaging of the chest using the standard protocol during bolus administration of intravenous contrast. Multiplanar reconstructed images including MIPs were obtained and reviewed to evaluate the vascular anatomy.  Contrast: 80mL OMNIPAQUE IOHEXOL 350 MG/ML IV SOLN  Comparison: Chest radiography same day  Findings: Pulmonary arterial opacification is good.  There are no discernible pulmonary emboli.  The aorta shows mild atherosclerosis but no aneurysm or dissection.  There is a small amount of coronary artery calcification.  There is no pleural or pericardial fluid. There is no evidence of  pulmonary infiltrate, mass or collapse. There are a few small subpleural nodules in the right lung there are benign.  Scans in the upper abdomen do not show any significant finding.  No significant bony finding.  IMPRESSION: No pulmonary emboli or other acute chest pathology evident.  The patient does have some coronary artery calcification.  Original Report Authenticated By: Thomasenia Sales, M.D.     1. Bronchitis     MDM  Patient presents with shortness of breath, cough, wheezing. 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 He looks very comfortable at this time. He was given a DuoNeb in the triage area with only  minimal relief of his symptoms. We'll order an hour long nebulizer. Chest x-ray negative for pneumonia. There is slight pulmonary vascular congestion minimal chronic bronchitic changes. Labs, CTA chest pending.  Labs reviewed and unremarkable. CTA chest pending  4:00 PM  Patient appears much better. His respiratory rate is improved. His wheezing has resolved. His CTA chest was negative for pulmonary embolism. We'll treat as bronchitis. Patient counseled regarding smoking cessation. We'll go home with azithromycin, prednisone, albuterol. Primary care referral.        Forbes Cellar, MD 09/14/11 1559  Forbes Cellar, MD 09/14/11 1600

## 2011-09-14 NOTE — ED Notes (Signed)
Sob x 3 days. Productive cough. Flu 2 months ago and symptoms have never completely resolved.

## 2013-02-20 DIAGNOSIS — H33319 Horseshoe tear of retina without detachment, unspecified eye: Secondary | ICD-10-CM

## 2013-02-20 HISTORY — DX: Horseshoe tear of retina without detachment, unspecified eye: H33.319

## 2020-07-25 DIAGNOSIS — R809 Proteinuria, unspecified: Secondary | ICD-10-CM | POA: Insufficient documentation

## 2020-07-25 DIAGNOSIS — E1122 Type 2 diabetes mellitus with diabetic chronic kidney disease: Secondary | ICD-10-CM | POA: Insufficient documentation

## 2020-07-25 HISTORY — DX: Morbid (severe) obesity due to excess calories: E66.01

## 2020-08-21 DIAGNOSIS — Z794 Long term (current) use of insulin: Secondary | ICD-10-CM

## 2020-08-21 HISTORY — DX: Long term (current) use of insulin: Z79.4

## 2021-06-05 ENCOUNTER — Emergency Department (HOSPITAL_COMMUNITY)
Admission: EM | Admit: 2021-06-05 | Discharge: 2021-06-05 | Disposition: A | Discharge status: Home / Self Care | Payer: Self-pay | Attending: Emergency Medicine | Admitting: Emergency Medicine

## 2021-06-05 ENCOUNTER — Emergency Department (HOSPITAL_COMMUNITY): Payer: Self-pay

## 2021-06-05 DIAGNOSIS — E119 Type 2 diabetes mellitus without complications: Secondary | ICD-10-CM | POA: Insufficient documentation

## 2021-06-05 DIAGNOSIS — J441 Chronic obstructive pulmonary disease with (acute) exacerbation: Secondary | ICD-10-CM | POA: Insufficient documentation

## 2021-06-05 DIAGNOSIS — Z794 Long term (current) use of insulin: Secondary | ICD-10-CM | POA: Insufficient documentation

## 2021-06-05 DIAGNOSIS — I639 Cerebral infarction, unspecified: Secondary | ICD-10-CM | POA: Insufficient documentation

## 2021-06-05 DIAGNOSIS — I10 Essential (primary) hypertension: Secondary | ICD-10-CM | POA: Insufficient documentation

## 2021-06-05 DIAGNOSIS — R29818 Other symptoms and signs involving the nervous system: Secondary | ICD-10-CM

## 2021-06-05 DIAGNOSIS — R42 Dizziness and giddiness: Secondary | ICD-10-CM | POA: Insufficient documentation

## 2021-06-05 DIAGNOSIS — Z79899 Other long term (current) drug therapy: Secondary | ICD-10-CM | POA: Insufficient documentation

## 2021-06-05 DIAGNOSIS — F17210 Nicotine dependence, cigarettes, uncomplicated: Secondary | ICD-10-CM | POA: Insufficient documentation

## 2021-06-05 DIAGNOSIS — R600 Localized edema: Secondary | ICD-10-CM | POA: Insufficient documentation

## 2021-06-05 DIAGNOSIS — Z20822 Contact with and (suspected) exposure to covid-19: Secondary | ICD-10-CM | POA: Insufficient documentation

## 2021-06-05 DIAGNOSIS — R531 Weakness: Secondary | ICD-10-CM | POA: Insufficient documentation

## 2021-06-05 DIAGNOSIS — Z7984 Long term (current) use of oral hypoglycemic drugs: Secondary | ICD-10-CM | POA: Insufficient documentation

## 2021-06-05 LAB — DIFFERENTIAL
Abs Immature Granulocytes: 0.04 10*3/uL (ref 0.00–0.07)
Basophils Absolute: 0.1 10*3/uL (ref 0.0–0.1)
Basophils Relative: 1 %
Eosinophils Absolute: 0.3 10*3/uL (ref 0.0–0.5)
Eosinophils Relative: 3 %
Immature Granulocytes: 1 %
Lymphocytes Relative: 27 %
Lymphs Abs: 2.4 10*3/uL (ref 0.7–4.0)
Monocytes Absolute: 0.9 10*3/uL (ref 0.1–1.0)
Monocytes Relative: 10 %
Neutro Abs: 5.1 10*3/uL (ref 1.7–7.7)
Neutrophils Relative %: 58 %

## 2021-06-05 LAB — CBG MONITORING, ED: Glucose-Capillary: 82 mg/dL (ref 70–99)

## 2021-06-05 LAB — I-STAT CHEM 8, ED
BUN: 23 mg/dL (ref 8–23)
Calcium, Ion: 1.18 mmol/L (ref 1.15–1.40)
Chloride: 108 mmol/L (ref 98–111)
Creatinine, Ser: 1.7 mg/dL — ABNORMAL HIGH (ref 0.61–1.24)
Glucose, Bld: 93 mg/dL (ref 70–99)
HCT: 34 % — ABNORMAL LOW (ref 39.0–52.0)
Hemoglobin: 11.6 g/dL — ABNORMAL LOW (ref 13.0–17.0)
Potassium: 3.9 mmol/L (ref 3.5–5.1)
Sodium: 145 mmol/L (ref 135–145)
TCO2: 26 mmol/L (ref 22–32)

## 2021-06-05 LAB — CBC
HCT: 34 % — ABNORMAL LOW (ref 39.0–52.0)
Hemoglobin: 10.9 g/dL — ABNORMAL LOW (ref 13.0–17.0)
MCH: 24.7 pg — ABNORMAL LOW (ref 26.0–34.0)
MCHC: 32.1 g/dL (ref 30.0–36.0)
MCV: 76.9 fL — ABNORMAL LOW (ref 80.0–100.0)
Platelets: 248 10*3/uL (ref 150–400)
RBC: 4.42 MIL/uL (ref 4.22–5.81)
RDW: 16.4 % — ABNORMAL HIGH (ref 11.5–15.5)
WBC: 8.7 10*3/uL (ref 4.0–10.5)
nRBC: 0 % (ref 0.0–0.2)

## 2021-06-05 LAB — TROPONIN I (HIGH SENSITIVITY)
Troponin I (High Sensitivity): 34 ng/L — ABNORMAL HIGH (ref <18)
Troponin I (High Sensitivity): 32 ng/L — ABNORMAL HIGH (ref <18)

## 2021-06-05 LAB — COMPREHENSIVE METABOLIC PANEL
ALT: 12 U/L (ref 0–44)
AST: 19 U/L (ref 15–41)
Albumin: 3.1 g/dL — ABNORMAL LOW (ref 3.5–5.0)
Alkaline Phosphatase: 57 U/L (ref 38–126)
Anion gap: 7 (ref 5–15)
BUN: 22 mg/dL (ref 8–23)
CO2: 25 mmol/L (ref 22–32)
Calcium: 9 mg/dL (ref 8.9–10.3)
Chloride: 109 mmol/L (ref 98–111)
Creatinine, Ser: 1.68 mg/dL — ABNORMAL HIGH (ref 0.61–1.24)
GFR, Estimated: 45 mL/min — ABNORMAL LOW (ref >60)
Glucose, Bld: 93 mg/dL (ref 70–99)
Potassium: 4 mmol/L (ref 3.5–5.1)
Sodium: 141 mmol/L (ref 135–145)
Total Bilirubin: 0.7 mg/dL (ref 0.3–1.2)
Total Protein: 8.1 g/dL (ref 6.5–8.1)

## 2021-06-05 LAB — BRAIN NATRIURETIC PEPTIDE: B Natriuretic Peptide: 10.9 pg/mL (ref 0.0–100.0)

## 2021-06-05 LAB — RESP PANEL BY RT-PCR (FLU A&B, COVID) ARPGX2
Influenza A by PCR: NEGATIVE
Influenza B by PCR: NEGATIVE
SARS Coronavirus 2 by RT PCR: NEGATIVE

## 2021-06-05 LAB — PROTIME-INR
INR: 1 (ref 0.8–1.2)
Prothrombin Time: 12.7 seconds (ref 11.4–15.2)

## 2021-06-05 LAB — APTT: aPTT: 33 seconds (ref 24–36)

## 2021-06-05 IMAGING — CT CT HEAD CODE STROKE
3 of 5 series · 14 of 47 positions shown, 16 images · non-contrast
Comparison: CT from [DATE].

CLINICAL DATA: Code stroke. Initial evaluation for neuro deficit,
stroke suspected, right-sided deficit with slurred speech.

EXAM:
CT HEAD WITHOUT CONTRAST
TECHNIQUE: Contiguous axial images were obtained from the base of the skull
through the vertex without intravenous contrast.

[Series 4: head 2.0 h70h · axial · 0.46mm/px · z∈[-115,+35]mm · 8 of 87 slices shown, 10 images]
[im 6/87  brain]
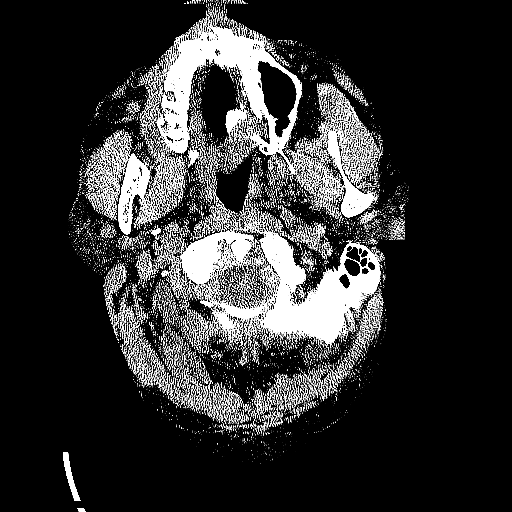
[im 6/87  bone]
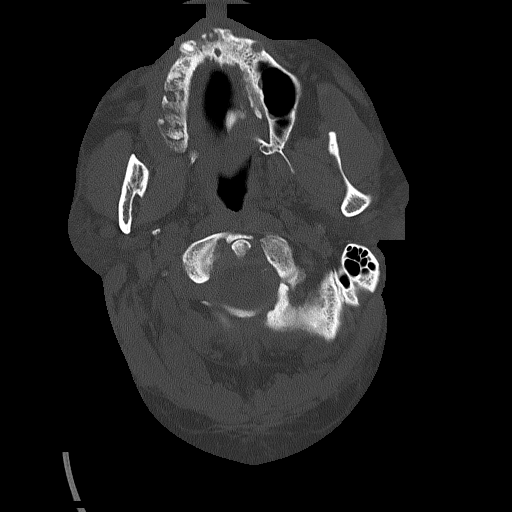
[im 17/87  brain]
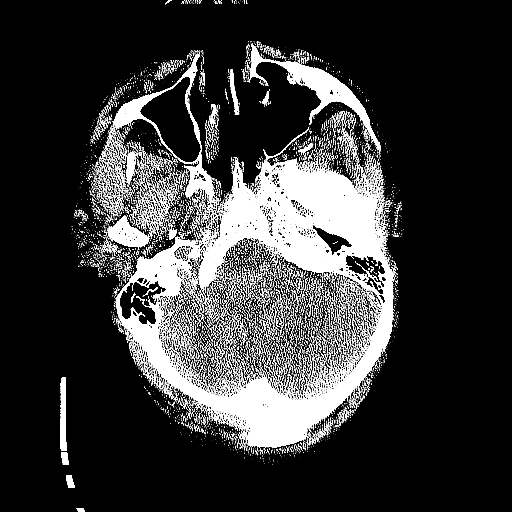
[im 27/87  brain]
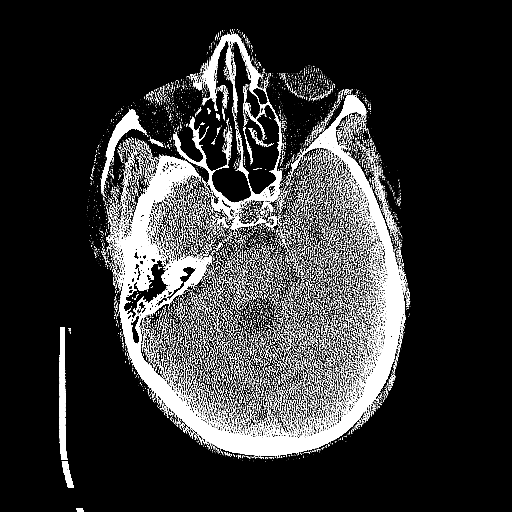
[im 38/87  brain]
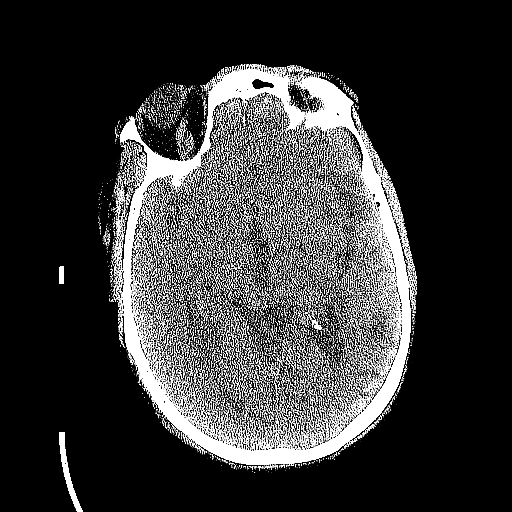
[im 49/87  brain]
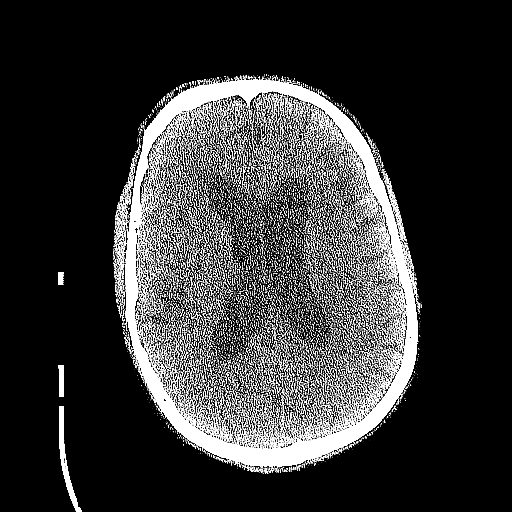
[im 49/87  bone]
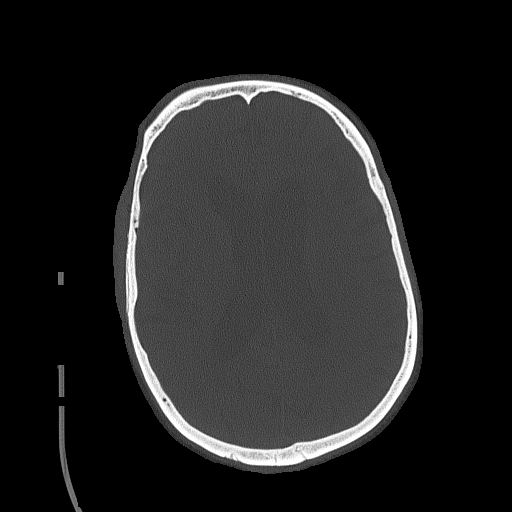
[im 60/87  brain]
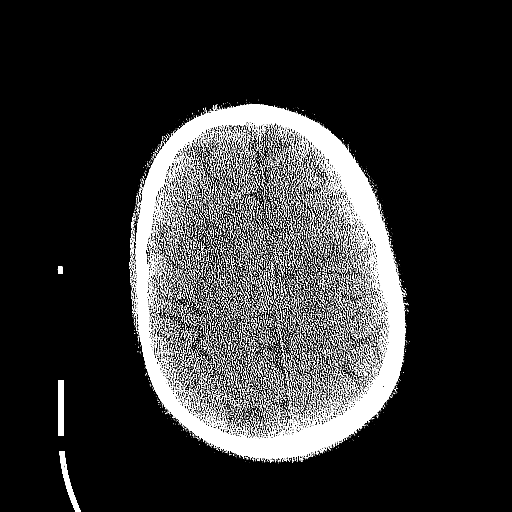
[im 70/87  brain]
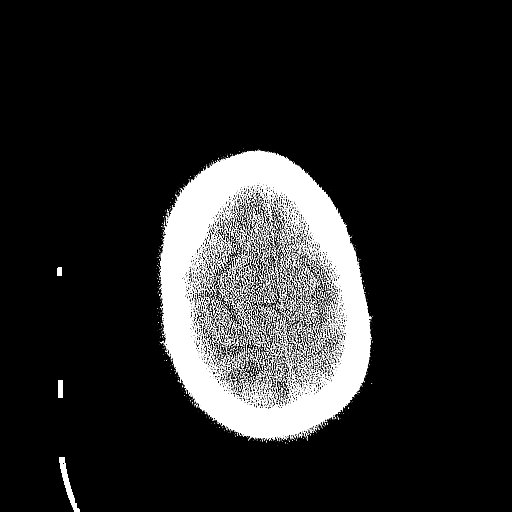
[im 81/87  brain]
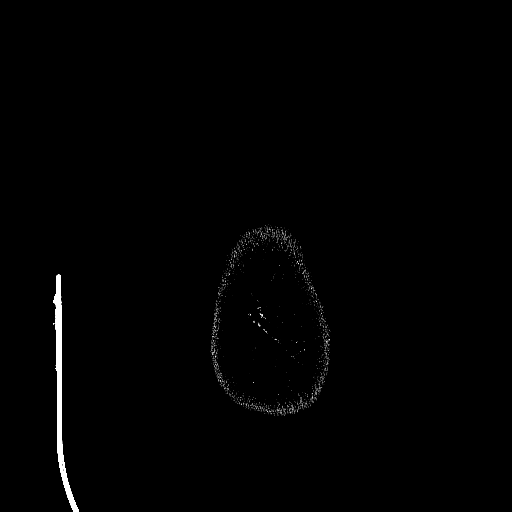

[Series 5: head 3.0 mpr cor · coronal · 0.34mm/px · 3 of 70 slices shown]
[im 24/70  brain]
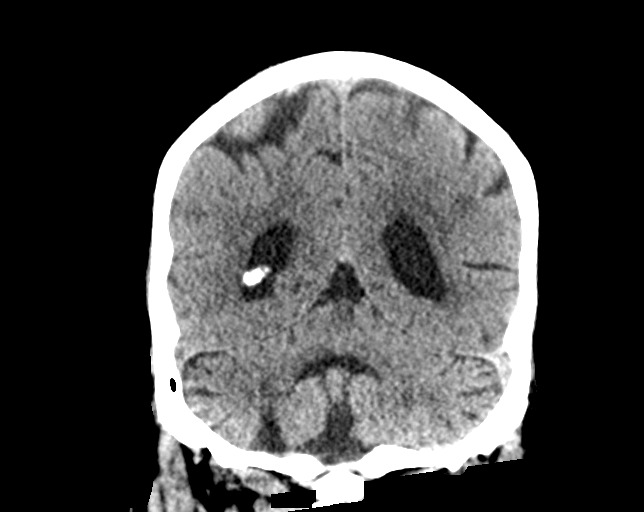
[im 31/70  brain]
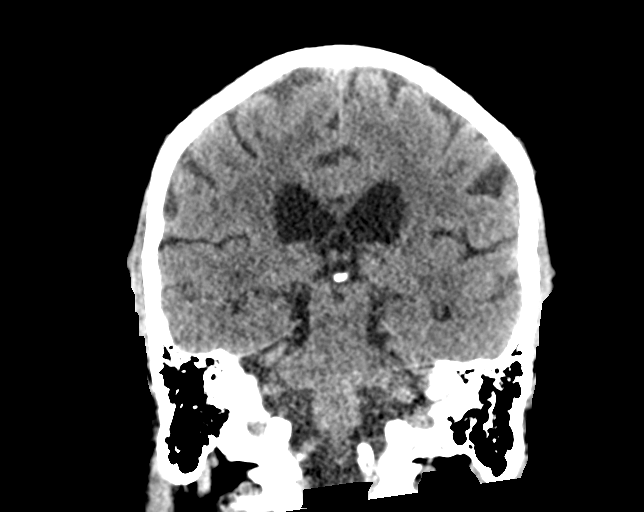
[im 39/70  brain]
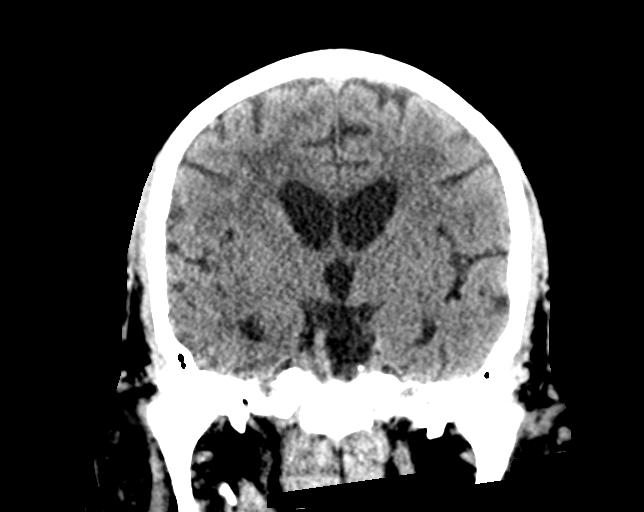

[Series 6: head 3.0 mpr sag · sagittal · 0.34mm/px · 3 of 55 slices shown]
[im 19/55  brain]
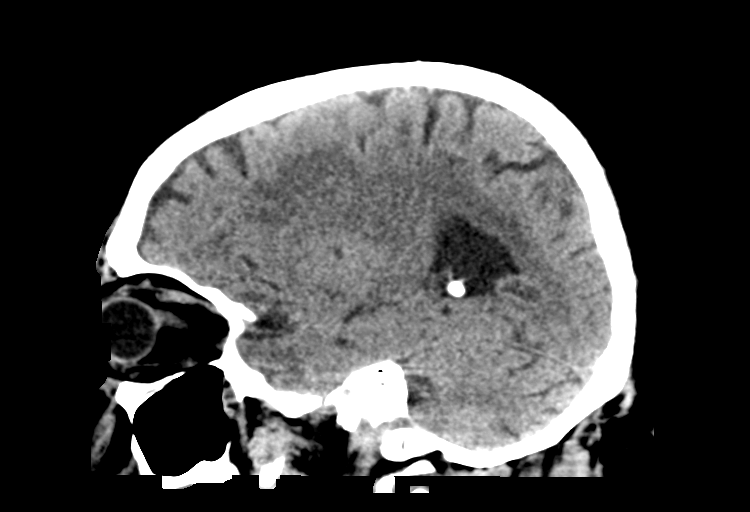
[im 28/55  brain]
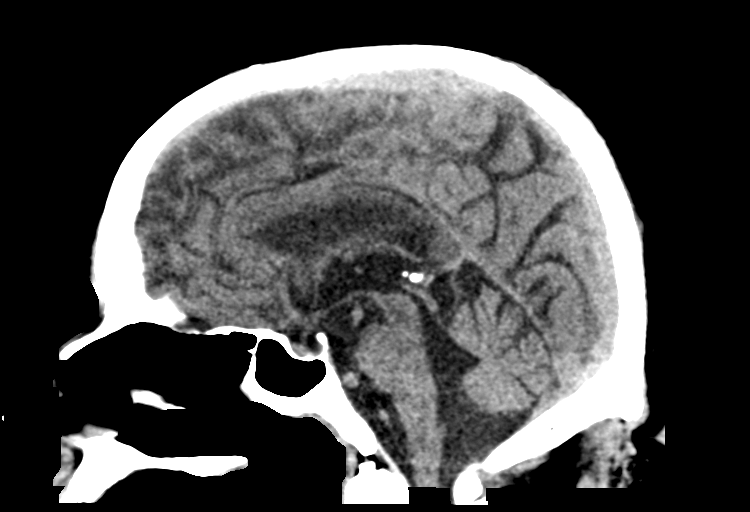
[im 37/55  brain]
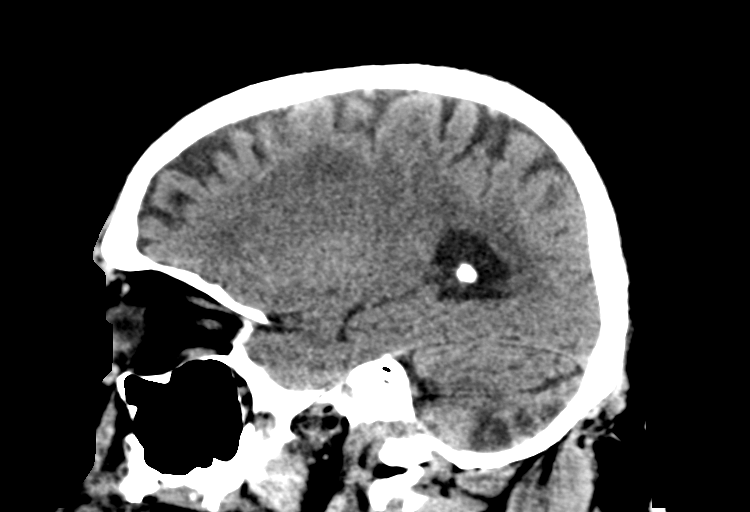

[14 of 47 positions shown; findings below may reference images not displayed]

FINDINGS: Brain: Atrophy with chronic microvascular ischemic disease. Chronic
right cerebellar infarct noted. Few remote lacunar infarcts noted
about the basal ganglia and thalami.

No acute intracranial hemorrhage. No acute large vessel territory
infarct. No mass lesion or midline shift. No hydrocephalus or
extra-axial fluid collection.

Vascular: No hyperdense vessel. Scattered vascular calcifications
noted within the carotid siphons.

Skull: Scalp soft tissues and calvarium within normal limits.

Sinuses/Orbits: Globes orbital soft tissues demonstrate no acute
finding. Right-sided axial myopia versus staphyloma noted. Mild
scattered mucosal thickening noted within the ethmoidal air cells
and maxillary sinuses. Paranasal sinuses are otherwise clear. No
mastoid effusion.

Other: None.

ASPECTS (Alberta Stroke Program Early CT Score)

- Ganglionic level infarction (caudate, lentiform nuclei, internal
capsule, insula, M1-M3 cortex): 7

- Supraganglionic infarction (M4-M6 cortex): 3

Total score (0-10 with 10 being normal): 10
IMPRESSION: 1. No acute intracranial abnormality.
2. ASPECTS is 10.
3. Chronic right cerebellar infarct, with a few additional remote
lacunar infarcts about the deep gray nuclei.
4. Underlying atrophy with chronic microvascular ischemic disease.

These results were communicated to Dr. TIGER at [DATE] on
[DATE] by text page via the [employer]ION messaging system.

## 2021-06-05 IMAGING — MR MR HEAD W/O CM
12 of 14 series · 40 of 48 positions shown · non-contrast
Comparison: Head CT from earlier today

CLINICAL DATA: Right-sided weakness and blurred vision



[Series 5: DWI · axial · 3.0mm · 0.96mm/px · z∈[-92,+73]mm · 6 of 111 slices shown (1 of 4)]
[im 1/111]
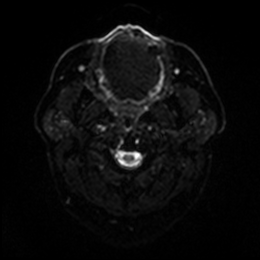
[im 23/111]
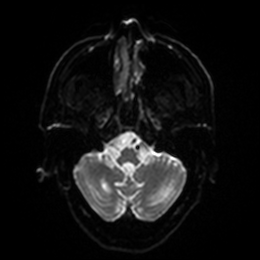
[im 45/111]
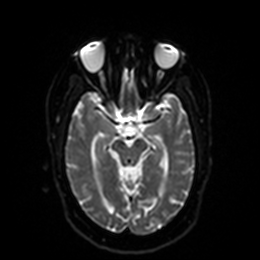
[im 67/111]
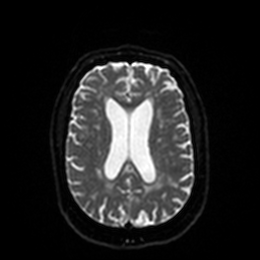
[im 89/111]
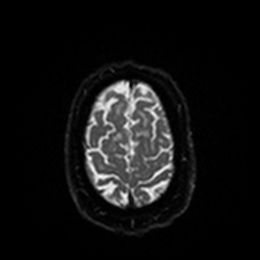
[im 111/111]
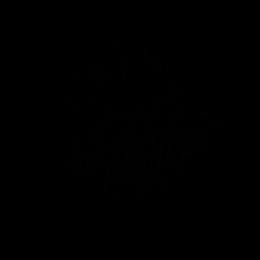

[Series 6: DWI · axial · 3.0mm · 0.96mm/px · z∈[-92,+70]mm · 3 of 55 slices shown (2 of 4)]
[im 1/55]
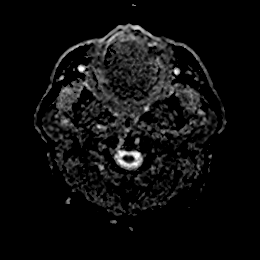
[im 28/55]
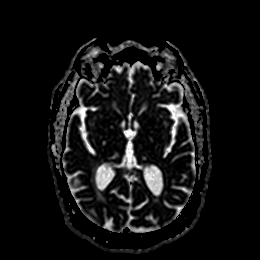
[im 55/55]
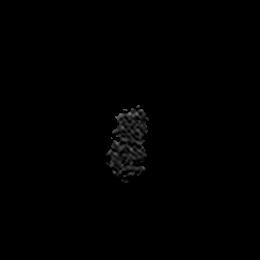

[Series 7: DWI · coronal · 4.0mm · 0.88mm/px · 5 of 80 slices shown (3 of 4)]
[im 1/80]
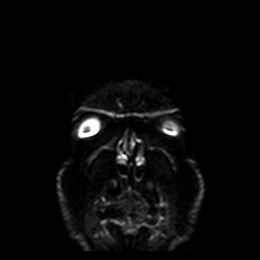
[im 20/80]
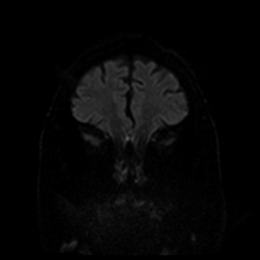
[im 40/80]
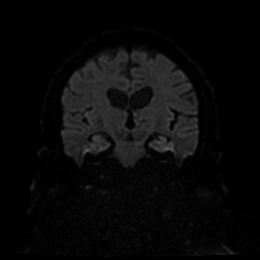
[im 60/80]
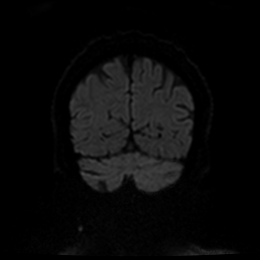
[im 80/80]
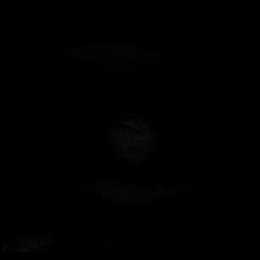

[Series 8: DWI · coronal · 4.0mm · 0.88mm/px · 3 of 40 slices shown (4 of 4)]
[im 1/40]
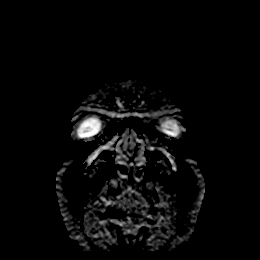
[im 20/40]
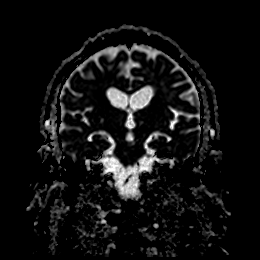
[im 40/40]
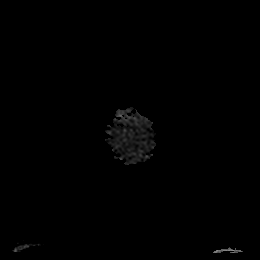

[Series 9: T1 · sagittal · 5.0mm · 0.78mm/px · 2 of 28 slices shown]
[im 1/28]
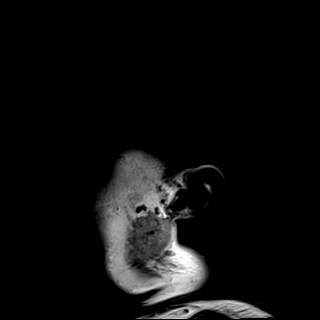
[im 28/28]
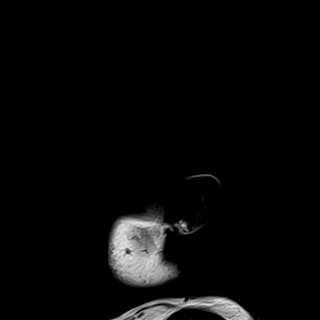

[Series 10: T2 · axial · 5.0mm · 0.78mm/px · z∈[-93,+74]mm · 2 of 29 slices shown (1 of 2)]
[im 1/29]
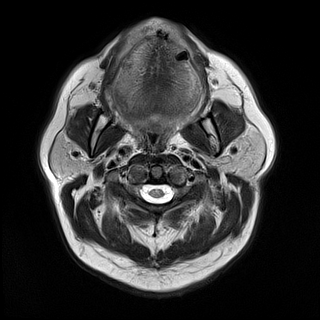
[im 29/29]
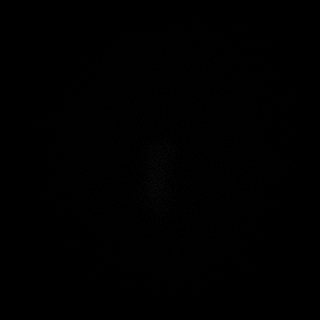

[Series 11: FLAIR · axial · 5.0mm · 0.49mm/px · z∈[-93,+75]mm · 2 of 29 slices shown]
[im 1/29]
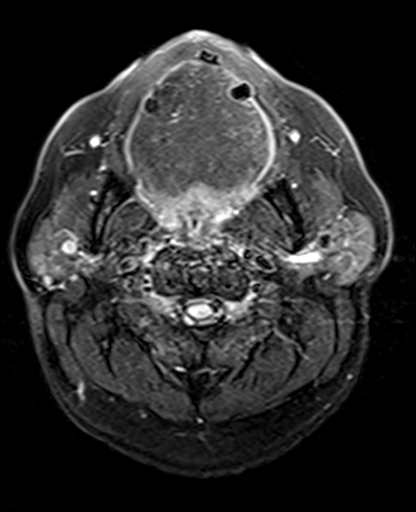
[im 29/29]
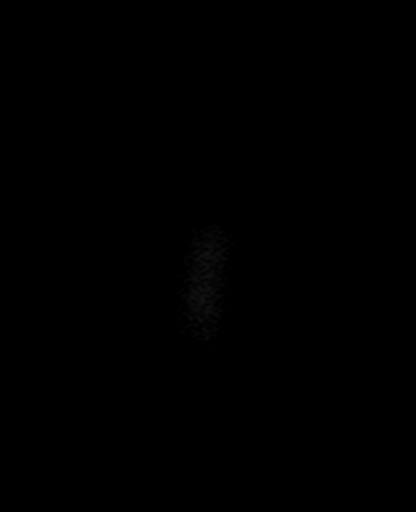

[Series 12: mag_images · axial · 3.0mm · 0.98mm/px · z∈[-91,+73]mm · 4 of 56 slices shown]
[im 1/56]
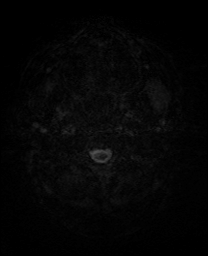
[im 19/56]
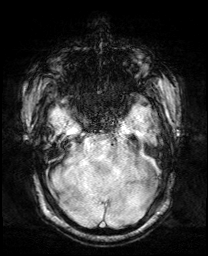
[im 37/56]
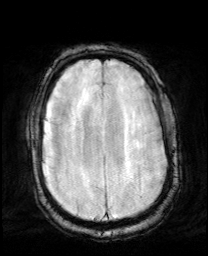
[im 56/56]
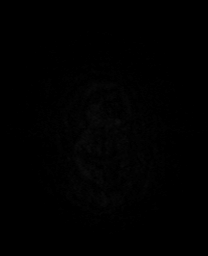

[Series 13: pha_images · axial · 3.0mm · 0.98mm/px · z∈[-91,+73]mm · 4 of 56 slices shown]
[im 1/56]
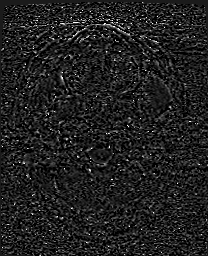
[im 19/56]
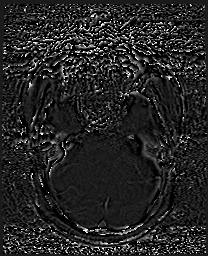
[im 37/56]
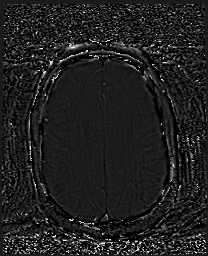
[im 56/56]
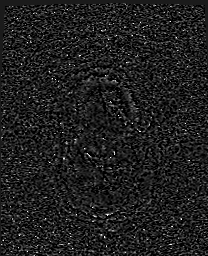

[Series 14: swi_images · axial · 3.0mm · 0.98mm/px · z∈[-91,+73]mm · 4 of 56 slices shown]
[im 1/56]
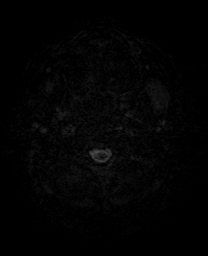
[im 19/56]
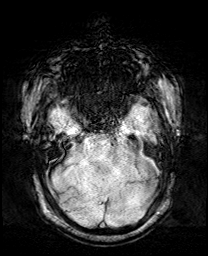
[im 37/56]
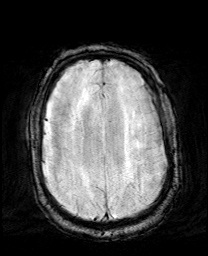
[im 56/56]
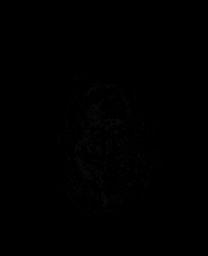

[Series 15: mip_images(sw) · axial · 24.0mm · 0.98mm/px · z∈[-81,+63]mm · 3 of 49 slices shown]
[im 1/49]
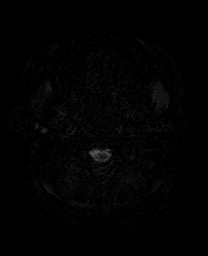
[im 25/49]
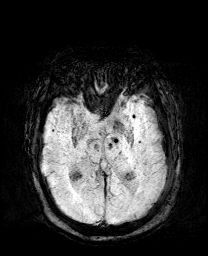
[im 49/49]
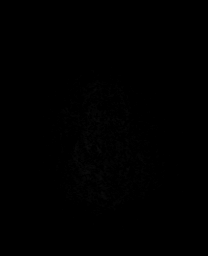

[Series 17: T2 · coronal · 5.0mm · 0.34mm/px · 2 of 34 slices shown (2 of 2)]
[im 1/34]
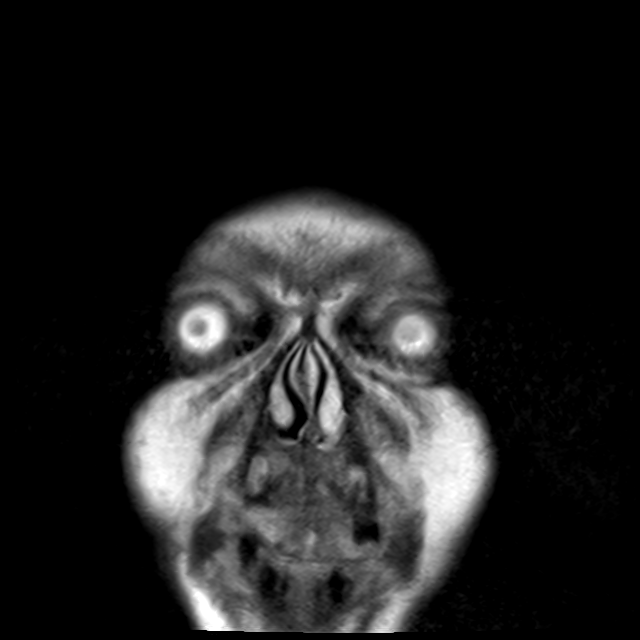
[im 34/34]
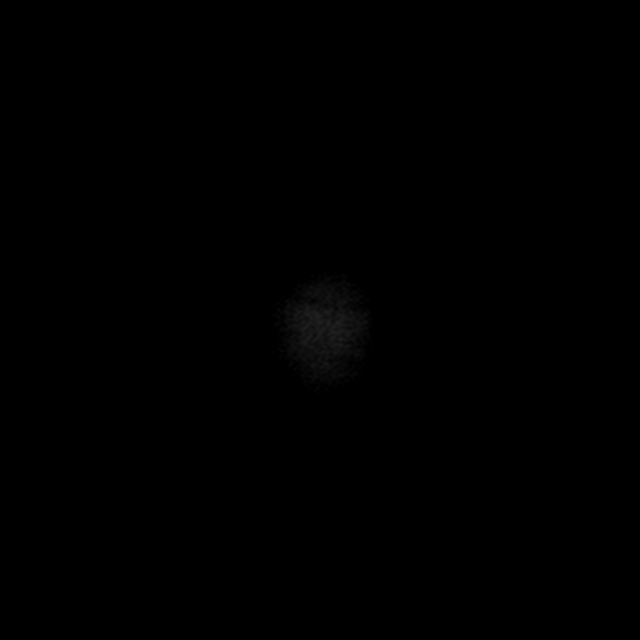

[40 of 48 positions shown; findings below may reference images not displayed]

FINDINGS: MRI HEAD FINDINGS

Brain: No acute infarction, hemorrhage, hydrocephalus, extra-axial
collection or mass lesion. Chronic small vessel disease with
ischemic gliosis and multiple chronic Lacunes in the brainstem and
bilateral deep gray nuclei. Chronic infarction with wallerian
changes crossing the corpus callosum body. Generalized cerebral
volume loss. Small remote right inferior cerebellar infarct. Chronic
microhemorrhages in the deep brain attributed to small vessel
disease.

Vascular: Preserved flow voids

Skull and upper cervical spine: Normal marrow signal

Sinuses/Orbits: Staphyloma appearance on the right

MRA HEAD FINDINGS

Irregularity of the bilateral ICA attributed to atherosclerosis. No
major branch occlusion or pre occlusion. Notable right A2 segment
stenosis.

Dominant left vertebral artery. High-grade narrowing at the distal
right V4 segment. Diffuse atheromatous irregularity of the basilar
and posterior cerebral arteries. Negative for aneurysm.

MRA NECK FINDINGS

Negative aortic arch where covered. There is antegrade flow in
bilateral carotid and vertebral arteries. Mild plaque is expected at
the carotid bifurcations. No flow limiting stenosis seen in the
covered vessels.
IMPRESSION: Brain MRI:

1. No acute finding.
2. Advanced chronic small vessel disease.

No intracranial MRA:

1. No emergent finding.
2. Generalized atherosclerosis with dominant narrowings at the right
A4 and right A2 segments.

Neck MRA:

Negative.

## 2021-06-05 IMAGING — MR MR MRA HEAD W/O CM
1 series · 19 of 48 positions shown · non-contrast
Comparison: Head CT from earlier today

CLINICAL DATA: Right-sided weakness and blurred vision



[Series 5: 3d cow · axial · 0.5mm · 0.43mm/px · z∈[-75,+14]mm · 19 of 188 slices shown]
[im 1/188]
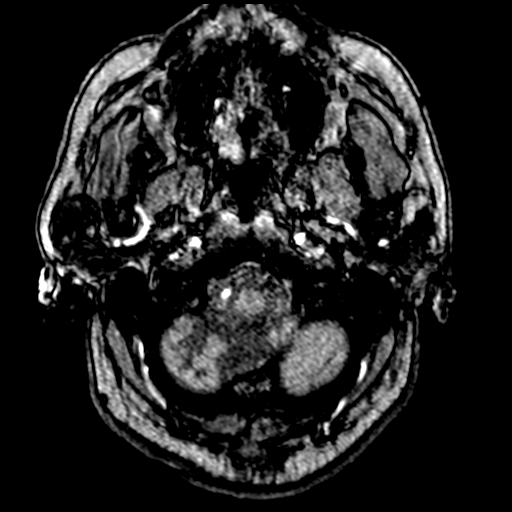
[im 4/188]
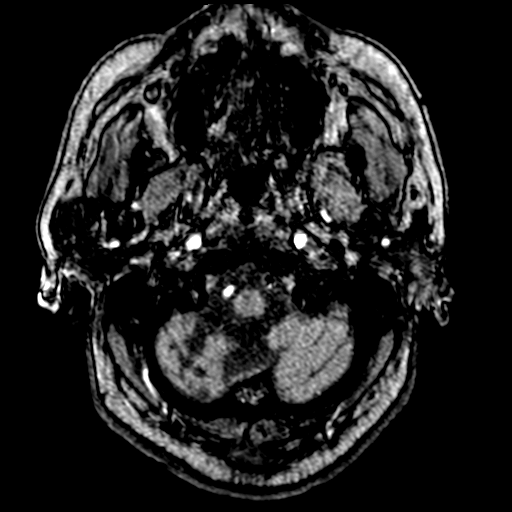
[im 8/188]
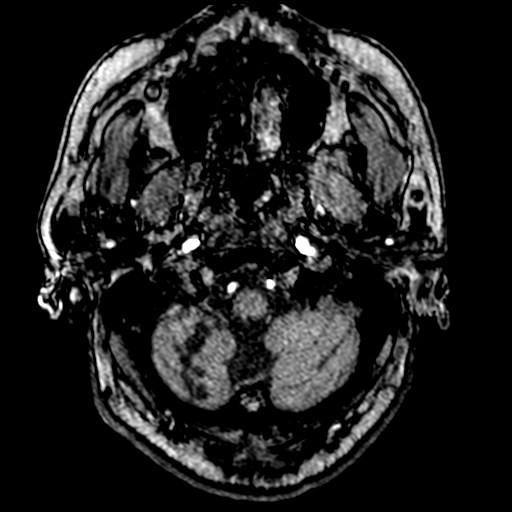
[im 12/188]
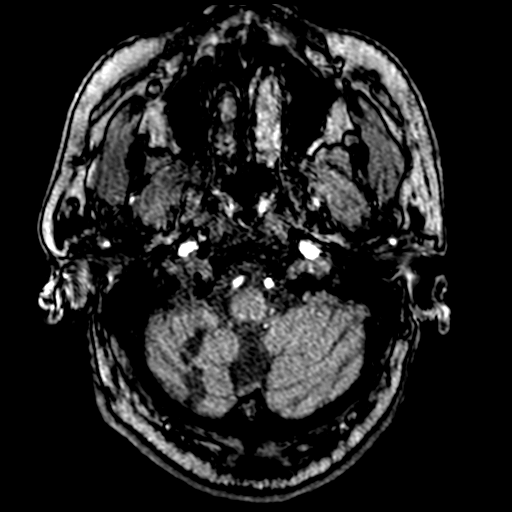
[im 16/188]
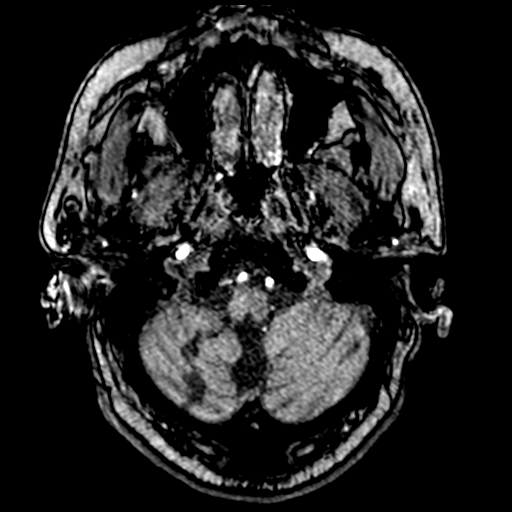
[im 20/188]
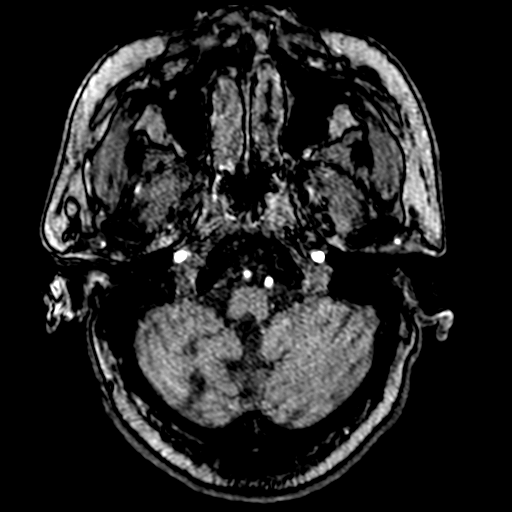
[im 24/188]
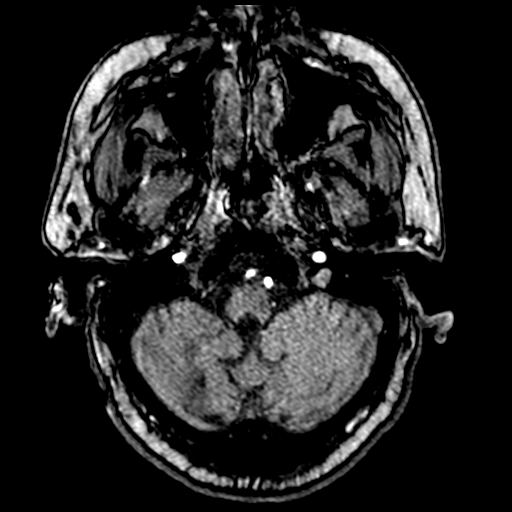
[im 28/188]
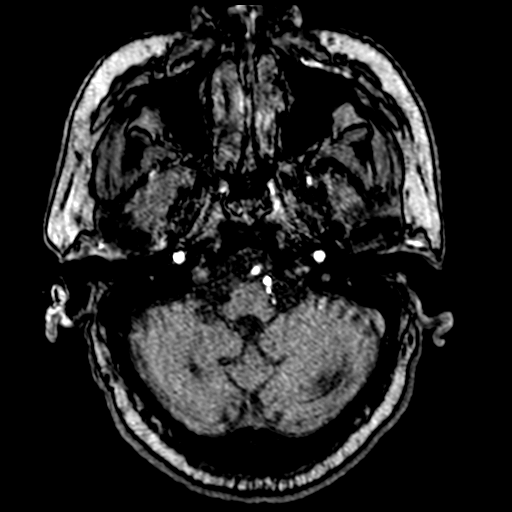
[im 32/188]
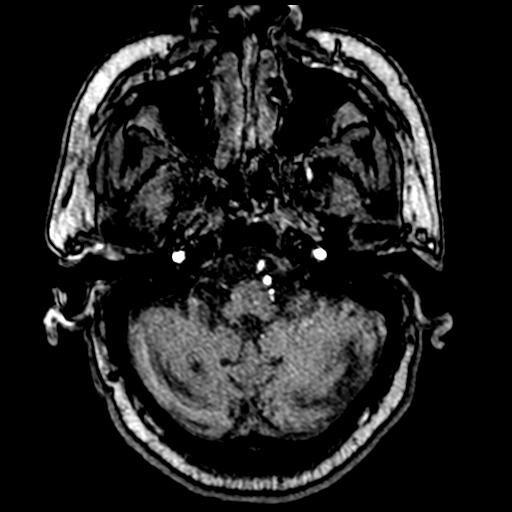
[im 36/188]
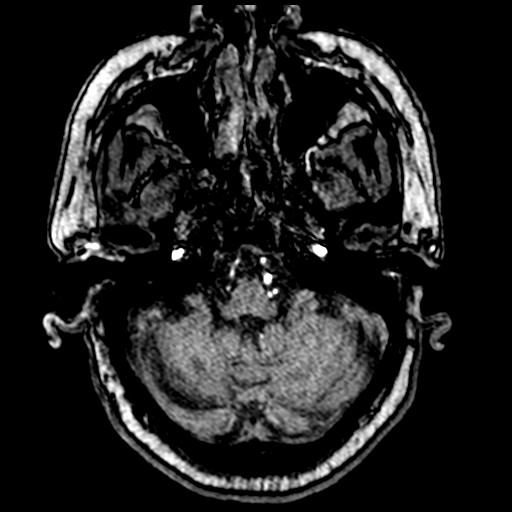
[im 40/188]
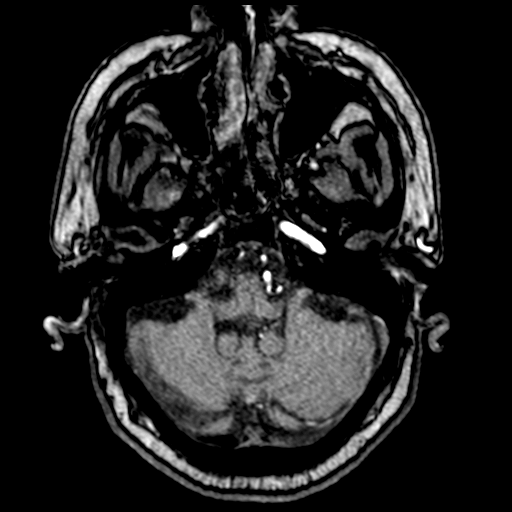
[im 60/188]
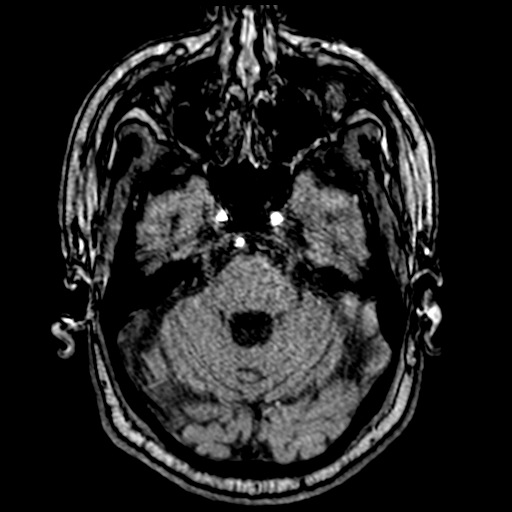
[im 84/188]
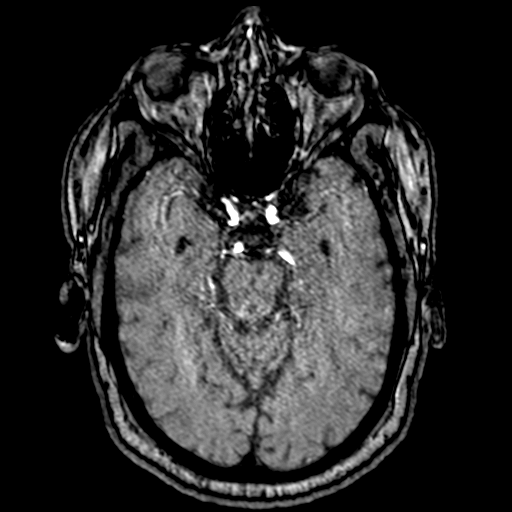
[im 96/188]
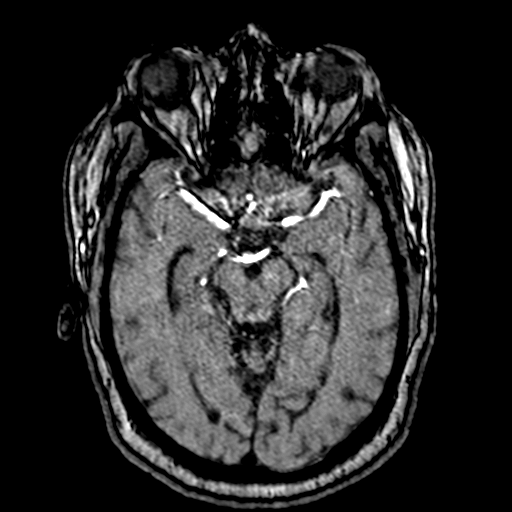
[im 108/188]
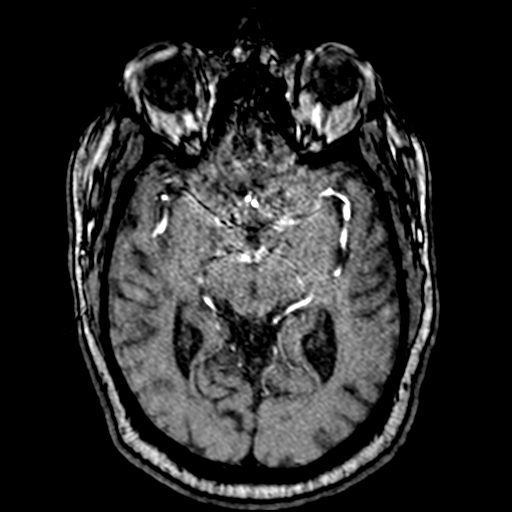
[im 132/188]
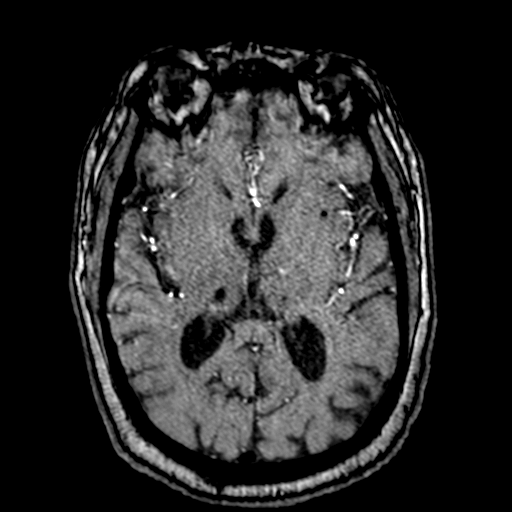
[im 156/188]
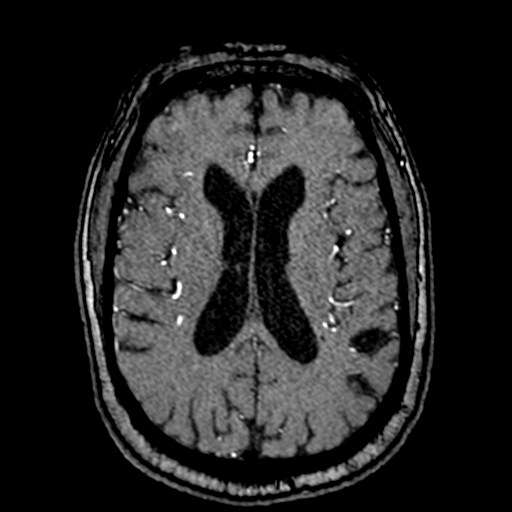
[im 160/188]
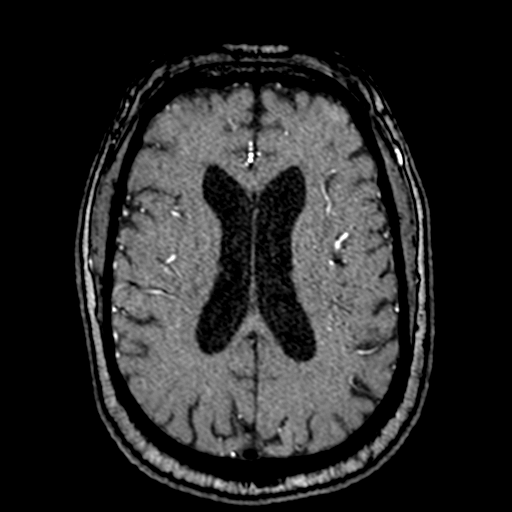
[im 180/188]
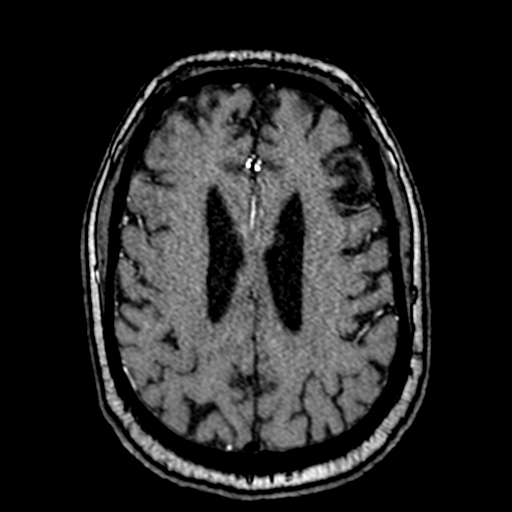

[19 of 48 positions shown; findings below may reference images not displayed]

FINDINGS: MRI HEAD FINDINGS

Brain: No acute infarction, hemorrhage, hydrocephalus, extra-axial
collection or mass lesion. Chronic small vessel disease with
ischemic gliosis and multiple chronic Lacunes in the brainstem and
bilateral deep gray nuclei. Chronic infarction with wallerian
changes crossing the corpus callosum body. Generalized cerebral
volume loss. Small remote right inferior cerebellar infarct. Chronic
microhemorrhages in the deep brain attributed to small vessel
disease.

Vascular: Preserved flow voids

Skull and upper cervical spine: Normal marrow signal

Sinuses/Orbits: Staphyloma appearance on the right

MRA HEAD FINDINGS

Irregularity of the bilateral ICA attributed to atherosclerosis. No
major branch occlusion or pre occlusion. Notable right A2 segment
stenosis.

Dominant left vertebral artery. High-grade narrowing at the distal
right V4 segment. Diffuse atheromatous irregularity of the basilar
and posterior cerebral arteries. Negative for aneurysm.

MRA NECK FINDINGS

Negative aortic arch where covered. There is antegrade flow in
bilateral carotid and vertebral arteries. Mild plaque is expected at
the carotid bifurcations. No flow limiting stenosis seen in the
covered vessels.
IMPRESSION: Brain MRI:

1. No acute finding.
2. Advanced chronic small vessel disease.

No intracranial MRA:

1. No emergent finding.
2. Generalized atherosclerosis with dominant narrowings at the right
A4 and right A2 segments.

Neck MRA:

Negative.

## 2021-06-05 IMAGING — MR MR MRA NECK W/O CM
4 of 9 series · 42 of 48 positions shown · non-contrast
Comparison: Head CT from earlier today

CLINICAL DATA: Right-sided weakness and blurred vision



[Series 7: tof_fl3d_tra_iso · axial · 0.6mm · 0.62mm/px · z∈[-168,-89]mm · 15 of 133 slices shown]
[im 1/133]
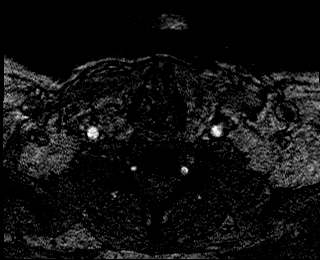
[im 10/133]
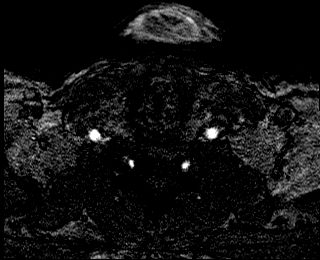
[im 19/133]
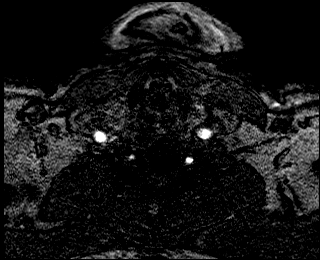
[im 29/133]
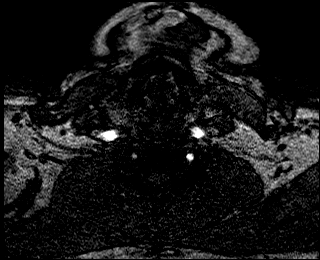
[im 38/133]
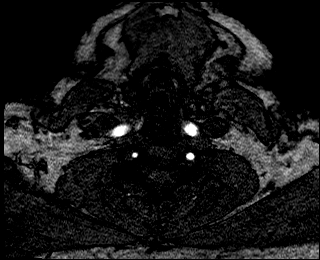
[im 48/133]
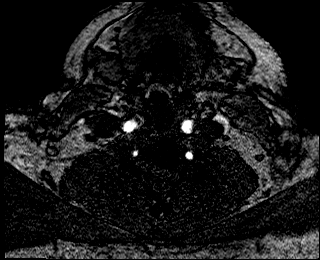
[im 57/133]
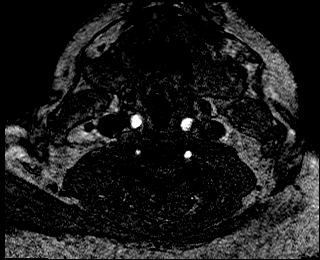
[im 67/133]
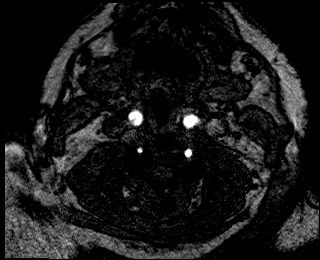
[im 76/133]
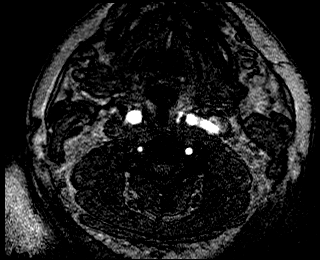
[im 85/133]
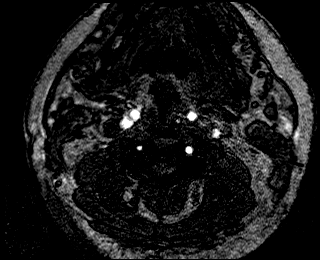
[im 95/133]
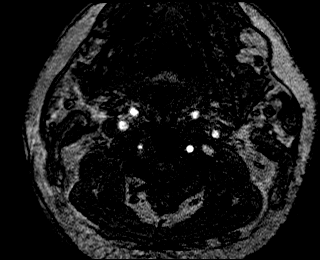
[im 104/133]
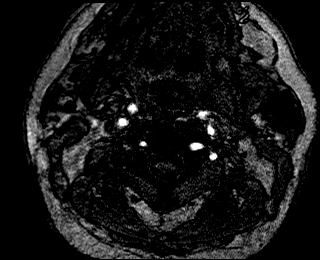
[im 114/133]
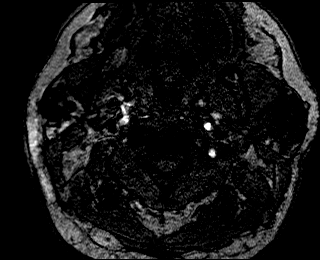
[im 123/133]
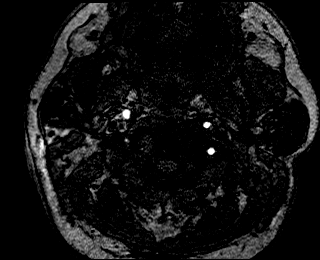
[im 133/133]
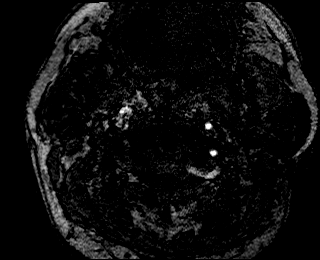

[Series 10: angio_fl3d_cor_pre_ttc=3.0s · coronal · 0.9mm · 0.85mm/px · 11 of 88 slices shown]
[im 1/88]
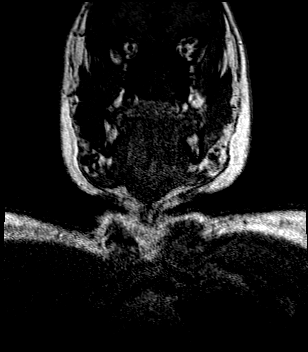
[im 9/88]
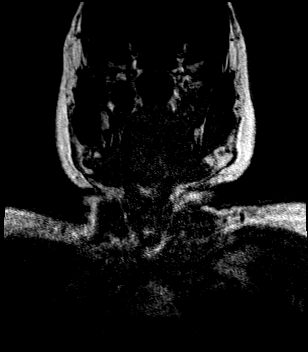
[im 18/88]
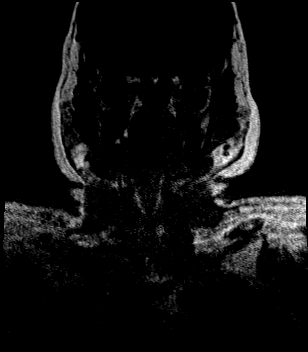
[im 27/88]
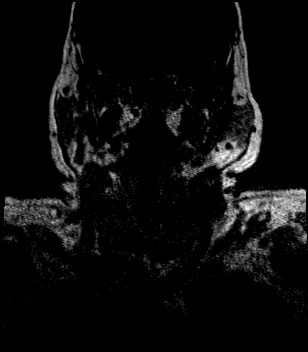
[im 35/88]
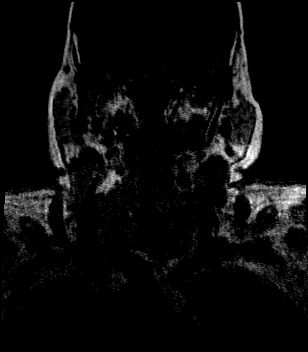
[im 44/88]
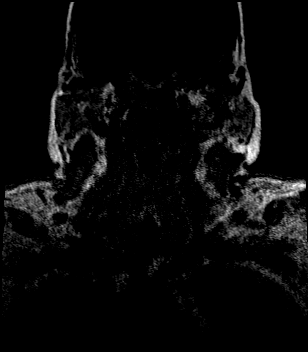
[im 53/88]
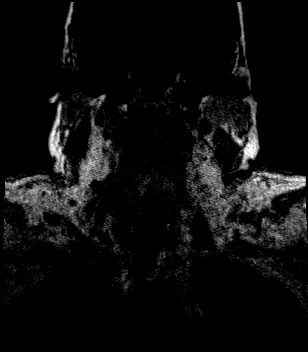
[im 61/88]
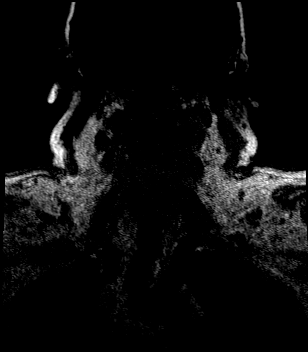
[im 70/88]
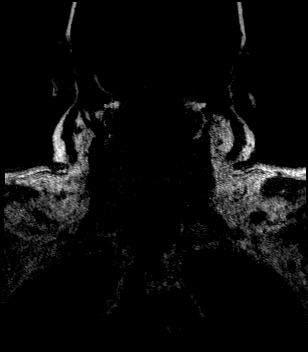
[im 79/88]
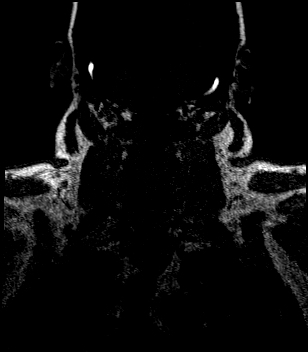
[im 88/88]
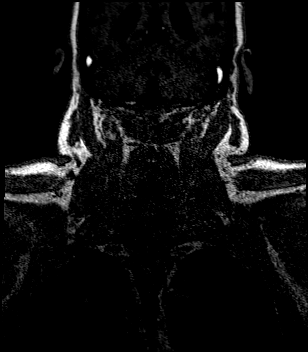

[Series 11: tof_fl2d_tra · axial · 3.0mm · 0.86mm/px · z∈[-280,-32]mm · 15 of 125 slices shown]
[im 1/125]
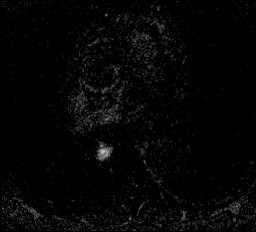
[im 9/125]
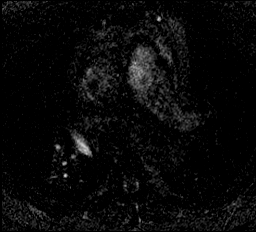
[im 18/125]
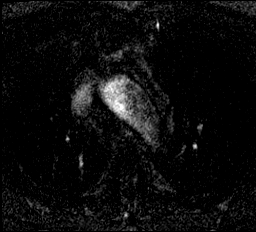
[im 27/125]
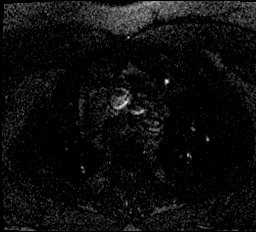
[im 36/125]
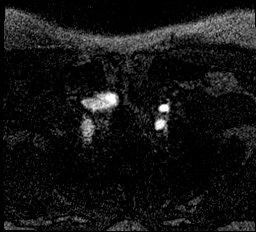
[im 45/125]
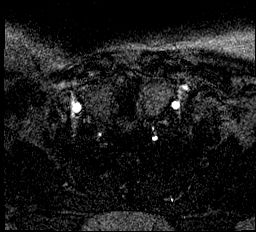
[im 54/125]
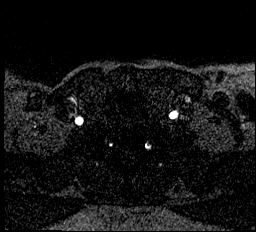
[im 63/125]
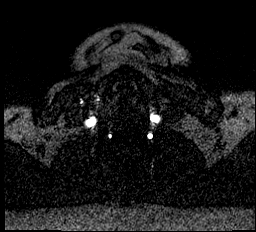
[im 71/125]
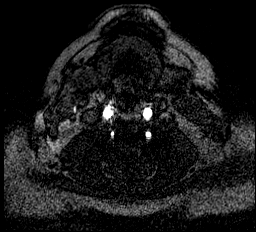
[im 80/125]
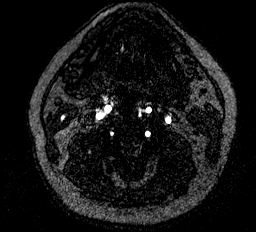
[im 89/125]
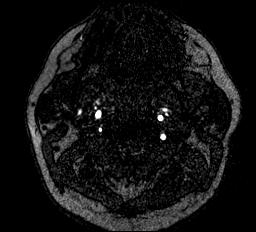
[im 98/125]
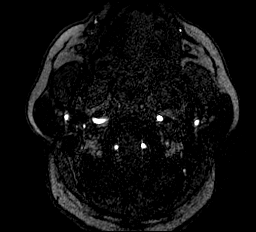
[im 107/125]
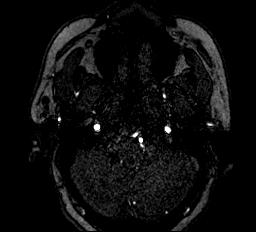
[im 116/125]
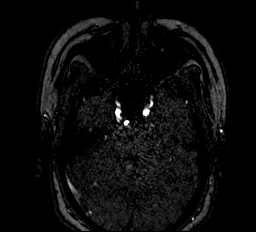
[im 125/125]
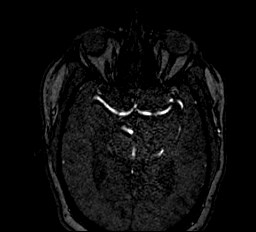

[Series 1013: rt icca rotate · coronal · 0.9mm · 0.26mm/px · 1 of 11 slices shown]
[im 1/11]
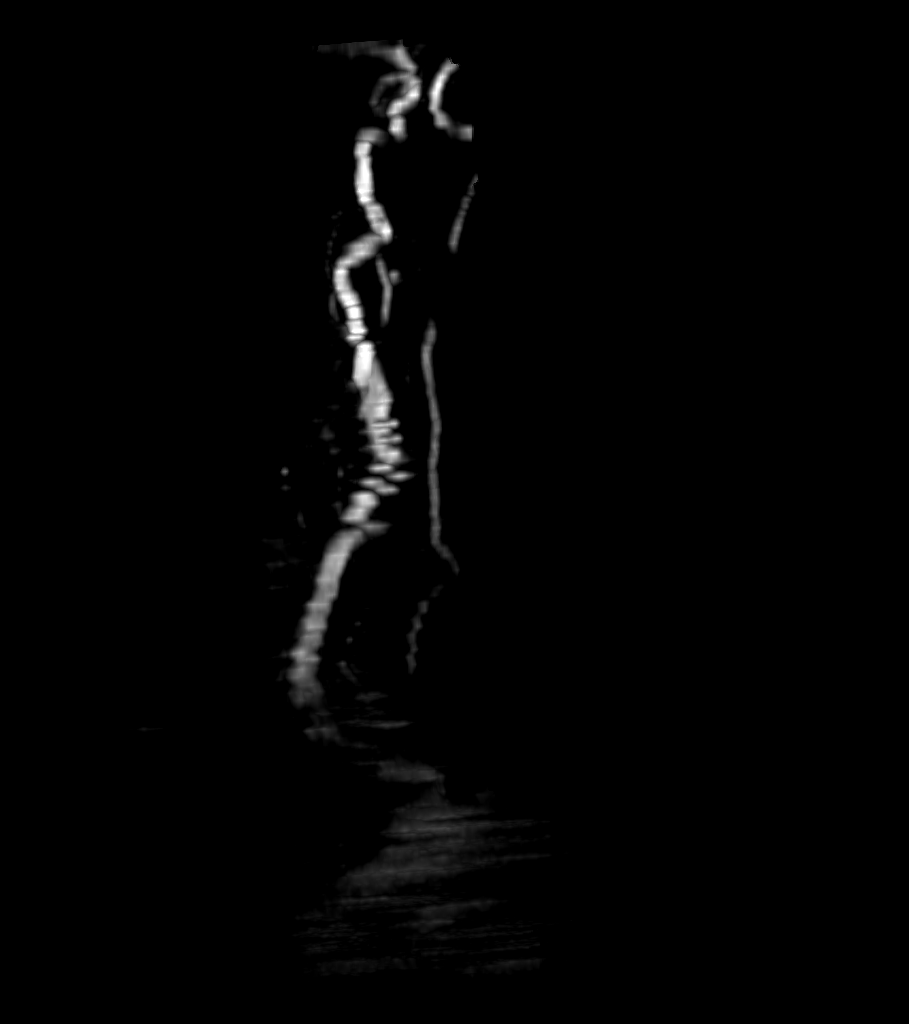

[42 of 48 positions shown; findings below may reference images not displayed]

FINDINGS: MRI HEAD FINDINGS

Brain: No acute infarction, hemorrhage, hydrocephalus, extra-axial
collection or mass lesion. Chronic small vessel disease with
ischemic gliosis and multiple chronic Lacunes in the brainstem and
bilateral deep gray nuclei. Chronic infarction with wallerian
changes crossing the corpus callosum body. Generalized cerebral
volume loss. Small remote right inferior cerebellar infarct. Chronic
microhemorrhages in the deep brain attributed to small vessel
disease.

Vascular: Preserved flow voids

Skull and upper cervical spine: Normal marrow signal

Sinuses/Orbits: Staphyloma appearance on the right

MRA HEAD FINDINGS

Irregularity of the bilateral ICA attributed to atherosclerosis. No
major branch occlusion or pre occlusion. Notable right A2 segment
stenosis.

Dominant left vertebral artery. High-grade narrowing at the distal
right V4 segment. Diffuse atheromatous irregularity of the basilar
and posterior cerebral arteries. Negative for aneurysm.

MRA NECK FINDINGS

Negative aortic arch where covered. There is antegrade flow in
bilateral carotid and vertebral arteries. Mild plaque is expected at
the carotid bifurcations. No flow limiting stenosis seen in the
covered vessels.
IMPRESSION: Brain MRI:

1. No acute finding.
2. Advanced chronic small vessel disease.

No intracranial MRA:

1. No emergent finding.
2. Generalized atherosclerosis with dominant narrowings at the right
A4 and right A2 segments.

Neck MRA:

Negative.

## 2021-06-05 MED ORDER — IPRATROPIUM-ALBUTEROL 0.5-2.5 (3) MG/3ML IN SOLN
3.0000 mL | RESPIRATORY_TRACT | Status: AC
Start: 2021-06-05 — End: 2021-06-05
  Administered 2021-06-05 (×3): 3 mL via RESPIRATORY_TRACT
  Filled 2021-06-05 (×3): qty 3

## 2021-06-05 MED ORDER — PREDNISONE 10 MG PO TABS: 40.0000 mg | ORAL_TABLET | Freq: Every day | ORAL | 0 refills | Status: DC

## 2021-06-05 MED ORDER — SODIUM CHLORIDE 0.9% FLUSH
3.0000 mL | Freq: Once | INTRAVENOUS | Status: AC
  Administered 2021-06-05: 3 mL via INTRAVENOUS

## 2021-06-05 MED ORDER — HYDROCHLOROTHIAZIDE 25 MG PO TABS: 25.0000 mg | ORAL_TABLET | Freq: Every day | ORAL | Status: DC

## 2021-06-05 MED ORDER — HYDRALAZINE HCL 25 MG PO TABS
50.0000 mg | ORAL_TABLET | Freq: Once | ORAL | Status: AC
  Administered 2021-06-05: 50 mg via ORAL
  Filled 2021-06-05: qty 2

## 2021-06-05 MED ORDER — METHYLPREDNISOLONE SODIUM SUCC 125 MG IJ SOLR
125.0000 mg | Freq: Once | INTRAMUSCULAR | Status: AC
  Administered 2021-06-05: 125 mg via INTRAVENOUS
  Filled 2021-06-05: qty 2

## 2021-06-05 MED ORDER — PREDNISONE 10 MG PO TABS: 40.0000 mg | ORAL_TABLET | Freq: Every day | ORAL | 0 refills | Status: AC

## 2021-06-05 MED ORDER — LOSARTAN POTASSIUM 50 MG PO TABS
100.0000 mg | ORAL_TABLET | Freq: Once | ORAL | Status: AC
  Administered 2021-06-05: 100 mg via ORAL
  Filled 2021-06-05: qty 2

## 2021-06-05 NOTE — ED Notes (Signed)
Patient ambulated with steady gait, maintained SpO2 98% during ambulation on room air.

## 2021-06-05 NOTE — Code Documentation (Signed)
Responded to Code Stroke called at 0130 for R sided weakness, blurred vision, dizziness, and slurred speech, [id number]. Pt arrived at 0136, CBG-82, NIH-2, CTH-negative for acute changes. TNK not given-symptoms mild. Plan MRI.

## 2021-06-05 NOTE — ED Provider Notes (Signed)
  Physical Exam  BP (!) 155/98   Pulse 70   Temp 97.9 F (36.6 C) (Oral)   Resp 10   Ht 5\' 9"  (1.753 m)   Wt (!) 146.4 kg   SpO2 98%   BMI 47.66 kg/m   Physical Exam  ED Course/Procedures     Procedures  MDM  Received patient in signout.  Dizziness.  Has shortness of breath.  Work-up reassuring for dizziness.  Creatinine mildly increased.  Blood pressure also increased was improved after treatment with his home medications.  Had some shortness of breath.  History of COPD.  Improved and not hypoxic with ambulation.  Will treat with steroids.  Likely COPD exacerbation.  MRI negative for stroke.  Discharge home with PCP follow-up.  Troponin mildly elevated but has been stable.  Likely due to the hypertension.  Doubt hypertension as the cause of the dizziness       , MD 06/05/21 [phone]

## 2021-06-05 NOTE — Consult Note (Signed)
Neurology Consultation Reason for Consult: Dizziness Referring Physician: Adela Lank, D  CC: Dizziness  History is obtained from: Patient  HPI: Peter Terry is a 65 y.o. male who was in his normal state of health when he drove to work at 11 PM.  He started getting tired shortly after that, and then fell asleep for a brief period of time.  When he woke up, he noticed that he felt weak all over, lightheaded, and severely short of breath.  He also felt like the room was spinning to some degree, and had some nausea.  He described that he felt pain "all over."  He was brought into the emergency department as a code stroke and on evaluation, his symptoms were relatively mild and did not localize very well.  He was complaining of pain in bilateral lower extremities, making the drift seen there suspect.   LKW: 11 PM tpa given?: no, mild symptoms    ROS: A 14 point ROS was performed and is negative except as noted in the HPI.  Past Medical History:  Diagnosis Date   Diabetes mellitus    High cholesterol    Hypertension      FHx: no hx of similar   Social History:  reports that he has been smoking. He has been smoking an average of 1 pack per day. He does not have any smokeless tobacco history on file. He reports that he does not drink alcohol and does not use drugs.   Exam: Current vital signs: BP (!) 170/91 (BP Location: Right Arm)   Pulse 67   Resp (!) 26   Ht 5\' 9"  (1.753 m)   Wt (!) 146.4 kg   SpO2 98%   BMI 47.66 kg/m  Vital signs in last 24 hours: Pulse Rate:  [67] 67 (10/27 0208) Resp:  [26] 26 (10/27 0208) BP: (170)/(91) 170/91 (10/27 0208) SpO2:  [98 %] 98 % (10/27 0208) Weight:  [146.4 kg] 146.4 kg (10/27 0100)   Physical Exam  Constitutional: Appears well-developed and well-nourished.  Psych: Affect appropriate to situation Eyes: No scleral injection HENT: No OP obstruction MSK: no joint deformities.  Cardiovascular: Normal rate and regular rhythm.   Respiratory: Effort normal, non-labored breathing GI: Soft.  No distension. There is no tenderness.  Skin: WDI  Neuro: Mental Status: Patient is awake, alert, oriented to person, place, month, year, and situation. Patient is able to give a clear and coherent history. No signs of aphasia or neglect Cranial Nerves: II: Visual Fields are full. Pupils are equal, round, and reactive to light.   III,IV, VI: EOMI without ptosis or diploplia.  V: Facial sensation is symmetric to temperature VII: Facial movement is symmetric.  VIII: hearing is intact to voice X: Uvula elevates symmetrically XI: Shoulder shrug is symmetric. XII: tongue is midline without atrophy or fasciculations.  Motor: Tone is normal. Bulk is normal. 5/5 strength was present in bilateral upper extremities, he has limitations in bilateral lower extremities stating they hurt, this is symmetric and there is drift equally bilaterally.  Sensory: Sensation is symmetric to light touch and temperature in the arms and legs. Cerebellar: No ataxia on FNF, limited in lower extremities due to habitus/pain.    I have reviewed labs in epic and the results pertinent to this consultation are: CBG - 82 Cr 1.68 CBC WBC - 8.7 CBC HgB- 10.9  I have reviewed the images obtained: CT head - multiple old CVAs  Impression: 65 year old male with acute onset of multiple complaints, including  shortness of breath, dizziness, weakness (generalized) and unsteadiness.  He does have some saccadic smooth pursuit, I think it is possible that he has a small cerebellar CVA, but there is nothing definite or localizing on exam.  I discussed with him that with his mild deficits, he is on the cusp of where I offer TNKase, I discussed the risk and benefits and after discussion the decision was made to not proceed with TNK.  I would perform an MRI of his brain, if he does have acute stroke, then he will need further work-up from this perspective.  If not, then I  would work-up his other medical complaints including shortness of breath, etc.  Recommendations: 1) MRI brain, MRA head and neck 2) further stroke work-up only if positive MRI.   Ritta Slot, MD [facility] [phone]  If 7pm- 7am, please page neurology on call as listed in AMION.

## 2021-06-05 NOTE — ED Notes (Signed)
Patient transported to MRI 

## 2021-06-05 NOTE — ED Notes (Signed)
Patient returned from MRI.

## 2021-06-05 NOTE — ED Provider Notes (Signed)
[facility] EMERGENCY DEPARTMENT Provider Note   CSN: [id number] Arrival date & time: 06/05/21  0136  An emergency department physician performed an initial assessment on this suspected stroke patient at 0140.  History Chief Complaint  Patient presents with   Code Stroke    Peter Terry is a 65 y.o. male.  65 yo M with a chief complaints of right-sided weakness and dizziness.  Patient arrived as a code stroke.  Level 5 caveat acuity of condition.       Past Medical History:  Diagnosis Date   Diabetes mellitus    High cholesterol    Hypertension     There are no problems to display for this patient.   Past Surgical History:  Procedure Laterality Date   BACK SURGERY         No family history on file.  Social History   Tobacco Use   Smoking status: Every Day    Packs/day: 1.00    Types: Cigarettes  Substance Use Topics   Alcohol use: No   Drug use: No    Home Medications Prior to Admission medications   Medication Sig Start Date End Date Taking? Authorizing Provider  albuterol (PROVENTIL HFA;VENTOLIN HFA) 108 (90 BASE) MCG/ACT inhaler Inhale 2 puffs into the lungs every 4 (four) hours as needed for wheezing. 09/14/11 06/05/21 Yes Forbes Cellar, MD  atorvastatin (LIPITOR) 80 MG tablet Take 80 mg by mouth at bedtime. 05/07/21  Yes [provider]  carvedilol (COREG) 25 MG tablet Take 25 mg by mouth 2 (two) times daily. 05/07/21  Yes [provider]  furosemide (LASIX) 40 MG tablet Take 40 mg by mouth daily. 05/07/21  Yes [provider]  hydrALAZINE (APRESOLINE) 50 MG tablet Take 50 mg by mouth 3 (three) times daily. 05/07/21  Yes [provider]  hydrochlorothiazide (HYDRODIURIL) 25 MG tablet Take 25 mg by mouth daily. 03/17/21  Yes [provider]  ibuprofen (ADVIL) 200 MG tablet Take 400 mg by mouth every 6 (six) hours as needed for headache or moderate pain.   Yes [provider]  Insulin  Glargine (LANTUS SOLOSTAR [place]) Inject 35 Units into the skin in the morning and at bedtime.   Yes [provider]  Insulin Lispro Prot & Lispro (HUMALOG 75/25 MIX) (75-25) 100 UNIT/ML Kwikpen Inject 25 Units into the skin 2 (two) times daily with a meal. 05/21/20  Yes [provider]  losartan (COZAAR) 100 MG tablet Take 100 mg by mouth daily. 05/07/21  Yes [provider]  metFORMIN (GLUCOPHAGE-XR) 500 MG 24 hr tablet Take 1,000 mg by mouth 2 (two) times daily. 05/07/21  Yes [provider]  omeprazole (PRILOSEC) 20 MG capsule Take 20 mg by mouth daily. 05/07/21  Yes [provider]  potassium chloride (MICRO-K) 10 MEQ CR capsule Take 10 mEq by mouth daily. 05/21/20  Yes [provider]  amLODipine (NORVASC) 10 MG tablet Take 10 mg by mouth every morning. 05/07/21   [provider]  predniSONE (DELTASONE) 10 MG tablet Take 4 tablets (40 mg total) by mouth daily for 4 days. 06/05/21 06/09/21  Benjiman Core, MD    Allergies    Patient has no known allergies.  Review of Systems   Review of Systems  Unable to perform ROS: Acuity of condition   Physical Exam Updated Vital Signs BP (!) 155/98   Pulse 70   Temp 97.9 F (36.6 C) (Oral)   Resp 10   Ht 5\' 9"  (  1.753 m)   Wt (!) 146.4 kg   SpO2 98%   BMI 47.66 kg/m   Physical Exam Vitals and nursing note reviewed.  Constitutional:      Appearance: He is well-developed.     Comments: BMI 48  HENT:     Head: Normocephalic and atraumatic.  Eyes:     Pupils: Pupils are equal, round, and reactive to light.  Neck:     Vascular: No JVD.  Cardiovascular:     Rate and Rhythm: Normal rate and regular rhythm.     Heart sounds: No murmur heard.   No friction rub. No gallop.  Pulmonary:     Effort: No respiratory distress.     Breath sounds: No wheezing.     Comments: Diminished breath sounds in all fields. Abdominal:     General: There is no distension.     Tenderness: There is  no abdominal tenderness. There is no guarding or rebound.  Musculoskeletal:        General: Normal range of motion.     Cervical back: Normal range of motion and neck supple.     Right lower leg: Edema present.     Left lower leg: Edema present.     Comments: 3+ pitting edema to bilateral lower extremities up to the knees.  Skin:    Coloration: Skin is not pale.     Findings: No rash.  Neurological:     Mental Status: He is alert and oriented to person, place, and time.     Comments: R sided weakness  Psychiatric:        Behavior: Behavior normal.    ED Results / Procedures / Treatments   Labs (all labs ordered are listed, but only abnormal results are displayed) Labs Reviewed  CBC - Abnormal; Notable for the following components:      Result Value   Hemoglobin 10.9 (*)    HCT 34.0 (*)    MCV 76.9 (*)    MCH 24.7 (*)    RDW 16.4 (*)    All other components within normal limits  COMPREHENSIVE METABOLIC PANEL - Abnormal; Notable for the following components:   Creatinine, Ser 1.68 (*)    Albumin 3.1 (*)    GFR, Estimated 45 (*)    All other components within normal limits  I-STAT CHEM 8, ED - Abnormal; Notable for the following components:   Creatinine, Ser 1.70 (*)    Hemoglobin 11.6 (*)    HCT 34.0 (*)    All other components within normal limits  TROPONIN I (HIGH SENSITIVITY) - Abnormal; Notable for the following components:   Troponin I (High Sensitivity) 34 (*)    All other components within normal limits  TROPONIN I (HIGH SENSITIVITY) - Abnormal; Notable for the following components:   Troponin I (High Sensitivity) 32 (*)    All other components within normal limits  RESP PANEL BY RT-PCR (FLU A&B, COVID) ARPGX2  PROTIME-INR  APTT  DIFFERENTIAL  BRAIN NATRIURETIC PEPTIDE  CBG MONITORING, ED    EKG EKG Interpretation  Date/Time:  Thursday June 05 2021 02:07:11 EDT Ventricular Rate:  66 PR Interval:  206 QRS Duration: 95 QT Interval:  424 QTC  Calculation: 445 R Axis:   27 Text Interpretation: Sinus rhythm Probable left atrial enlargement Baseline wander TECHNICALLY DIFFICULT Otherwise no significant change Confirmed by Melene Plan [phone]) on 06/05/2021 2:18:42 AM  Radiology MR ANGIO HEAD WO CONTRAST  Result Date: 06/05/2021 CLINICAL DATA:  Right-sided weakness and blurred  vision EXAM: MRI HEAD WITHOUT CONTRAST MRA HEAD WITHOUT CONTRAST MRA NECK WITHOUT CONTRAST TECHNIQUE: Multiplanar, multiecho pulse sequences of the brain and surrounding structures were obtained without intravenous contrast. Angiographic images of the Circle of Willis were obtained using MRA technique without intravenous contrast. Angiographic images of the neck were obtained using MRA technique without intravenous contrast. Carotid stenosis measurements (when applicable) are obtained utilizing NASCET criteria, using the distal internal carotid diameter as the denominator. COMPARISON:  Head CT from earlier today FINDINGS: MRI HEAD FINDINGS Brain: No acute infarction, hemorrhage, hydrocephalus, extra-axial collection or mass lesion. Chronic small vessel disease with ischemic gliosis and multiple chronic Lacunes in the brainstem and bilateral deep gray nuclei. Chronic infarction with wallerian changes crossing the corpus callosum body. Generalized cerebral volume loss. Small remote right inferior cerebellar infarct. Chronic microhemorrhages in the deep brain attributed to small vessel disease. 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 No major branch occlusion or pre occlusion. Notable right A2 segment stenosis. Dominant left vertebral artery. High-grade narrowing at the distal right V4 segment. Diffuse atheromatous irregularity of the basilar and posterior cerebral arteries. Negative for aneurysm. MRA NECK  FINDINGS Negative aortic arch where covered. There is antegrade flow in bilateral carotid and vertebral arteries. Mild plaque is expected at the carotid bifurcations. No flow limiting stenosis seen in the covered vessels. IMPRESSION: Brain MRI: 1. No acute finding. 2. Advanced chronic small vessel disease. No intracranial MRA: 1. No emergent finding. 2. Generalized atherosclerosis with dominant narrowings at the right A4 and right A2 segments. Neck MRA: Negative. Electronically Signed   By: Tiburcio Pea M.D.   On: 06/05/2021 06:46   MR ANGIO NECK WO CONTRAST  Result Date: 06/05/2021 CLINICAL DATA:  Right-sided weakness and blurred vision EXAM: MRI HEAD WITHOUT CONTRAST MRA HEAD WITHOUT CONTRAST MRA NECK WITHOUT CONTRAST TECHNIQUE: Multiplanar, multiecho pulse sequences of the brain and surrounding structures were obtained without intravenous contrast. Angiographic images of the Circle of Willis were obtained using MRA technique without intravenous contrast. Angiographic images of the neck were obtained using MRA technique without intravenous contrast. Carotid stenosis measurements (when applicable) are obtained utilizing NASCET criteria, using the distal internal carotid diameter as the denominator. COMPARISON:  Head CT from earlier today FINDINGS: MRI HEAD FINDINGS Brain: No acute infarction, hemorrhage, hydrocephalus, extra-axial collection or mass lesion. Chronic small vessel disease with ischemic gliosis and multiple chronic Lacunes in the brainstem and bilateral deep gray nuclei. Chronic infarction with wallerian changes crossing the corpus callosum body. Generalized cerebral volume loss. Small remote right inferior cerebellar infarct. Chronic microhemorrhages in the deep brain attributed to small vessel disease. 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 No major branch occlusion or pre occlusion. Notable right A2 segment stenosis. Dominant left vertebral artery. High-grade narrowing at the distal right V4 segment. Diffuse atheromatous irregularity of the basilar and posterior cerebral arteries. Negative for aneurysm. MRA NECK FINDINGS Negative aortic arch where covered. There is antegrade flow in bilateral carotid and vertebral arteries. Mild plaque is expected at the carotid bifurcations. No flow limiting stenosis seen in the covered vessels. IMPRESSION: Brain MRI: 1. No acute finding. 2. Advanced chronic small vessel disease. No intracranial MRA: 1. No emergent finding. 2. Generalized atherosclerosis with dominant narrowings at the right  A4 and right A2 segments. Neck MRA: Negative. Electronically Signed   By: Tiburcio Pea M.D.   On: 06/05/2021 06:46   MR BRAIN WO CONTRAST  Result Date: 06/05/2021 CLINICAL DATA:  Right-sided weakness and blurred vision EXAM: MRI HEAD WITHOUT CONTRAST MRA HEAD WITHOUT CONTRAST MRA NECK WITHOUT CONTRAST TECHNIQUE: Multiplanar, multiecho pulse sequences of the brain and surrounding structures were obtained without intravenous contrast. Angiographic images of the Circle of Willis were obtained using MRA technique without intravenous contrast. Angiographic images of the neck were obtained using MRA technique without intravenous contrast. Carotid stenosis measurements (when applicable) are obtained utilizing NASCET criteria, using the distal internal carotid diameter as the denominator. COMPARISON:  Head CT from earlier today FINDINGS: MRI HEAD FINDINGS Brain: No acute infarction, hemorrhage, hydrocephalus, extra-axial collection or mass lesion. Chronic small vessel disease with ischemic gliosis and multiple chronic Lacunes in the brainstem and bilateral deep gray nuclei. Chronic infarction with wallerian changes crossing the corpus callosum body. Generalized cerebral volume loss. Small remote  right inferior cerebellar infarct. Chronic microhemorrhages in the deep brain attributed to small vessel disease. 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 No major branch occlusion or pre occlusion. Notable right A2 segment stenosis. Dominant left vertebral artery. High-grade narrowing at the distal right V4 segment. Diffuse atheromatous irregularity of the basilar and posterior cerebral arteries. Negative for aneurysm. MRA NECK FINDINGS Negative aortic arch where covered. There is antegrade flow in bilateral carotid and vertebral arteries. Mild plaque is expected at the carotid bifurcations. No flow limiting stenosis seen in the covered vessels. IMPRESSION: Brain MRI: 1. No acute finding. 2. Advanced chronic small vessel disease. No intracranial MRA: 1. No emergent finding. 2. Generalized atherosclerosis with dominant narrowings at the right A4 and right A2 segments. Neck MRA: Negative. Electronically Signed   By: Tiburcio Pea M.D.   On: 06/05/2021 06:46   DG Chest Port 1 View  Result Date: 06/05/2021 CLINICAL DATA:  Shortness of breath. EXAM: PORTABLE CHEST 1 VIEW COMPARISON:  Chest radiograph dated 01/02/2015. FINDINGS: Mild cardiomegaly. No focal consolidation, pleural effusion, or pneumothorax. No acute osseous pathology. IMPRESSION: No active disease. Electronically Signed   By: Elgie Collard M.D.   On: 06/05/2021 02:48   CT HEAD CODE STROKE WO CONTRAST  Result Date: 06/05/2021 CLINICAL DATA:  Code stroke. Initial evaluation for neuro deficit, stroke suspected, right-sided deficit with slurred speech. EXAM: CT HEAD WITHOUT CONTRAST TECHNIQUE: Contiguous axial images were obtained from the base of the skull through the vertex without intravenous contrast. COMPARISON:  CT from 02/16/2006. FINDINGS: Brain: Atrophy with  chronic microvascular ischemic disease. Chronic right cerebellar infarct noted. Few remote lacunar infarcts noted about the basal ganglia and thalami. No acute intracranial hemorrhage. No acute large vessel territory infarct. No mass lesion or midline shift. No hydrocephalus or extra-axial fluid collection. Vascular: No hyperdense vessel. Scattered vascular calcifications noted within the carotid siphons. Skull: Scalp soft tissues and calvarium within normal limits. Sinuses/Orbits: Globes orbital soft tissues demonstrate no acute finding. Right-sided axial myopia versus staphyloma noted. Mild scattered mucosal thickening noted within the ethmoidal air cells and maxillary sinuses. Paranasal sinuses are otherwise clear. No mastoid effusion. Other: None. ASPECTS [facility] Stroke Program Early CT Score) - Ganglionic level infarction (caudate, lentiform nuclei, internal capsule, insula, M1-M3 cortex): 7 - Supraganglionic infarction (M4-M6 cortex): 3 Total score (0-10 with 10 being normal): 10 IMPRESSION: 1.  No acute intracranial abnormality. 2. ASPECTS is 10. 3. Chronic right cerebellar infarct, with a few additional remote lacunar infarcts about the deep gray nuclei. 4. Underlying atrophy with chronic microvascular ischemic disease. These results were communicated to Dr. Amada Jupiter at 2:00 am on 06/05/2021 by text page via the [facility] messaging system. Electronically Signed   By: Rise Mu M.D.   On: 06/05/2021 02:02    Procedures Procedures   Medications Ordered in ED Medications  sodium chloride flush (NS) 0.9 % injection 3 mL (3 mLs Intravenous Given 06/05/21 0211)  ipratropium-albuterol (DUONEB) 0.5-2.5 (3) MG/3ML nebulizer solution 3 mL (3 mLs Nebulization Given 06/05/21 0244)  methylPREDNISolone sodium succinate (SOLU-MEDROL) 125 mg/2 mL injection 125 mg (125 mg Intravenous Given 06/05/21 0446)  hydrALAZINE (APRESOLINE) tablet 50 mg (50 mg Oral Given 06/05/21 0737)  losartan (COZAAR) tablet 100  mg (100 mg Oral Given 06/05/21 0737)    ED Course  I have reviewed the triage vital signs and the nursing notes.  Pertinent labs & imaging results that were available during my care of the patient were reviewed by me and considered in my medical decision making (see chart for details).    MDM Rules/Calculators/A&P                           65 yo M with a chief complaints of right-sided weakness.  This was noted acutely.  Patient arrived as a code stroke.  Airway cleared at the bridge.  Taken urgently to CT.  I discussed case with neurologist, Dr. Amada Jupiter felt to possibly be a cerebellar stroke but was concerned about the patient's tachypnea.  Discussed risk and benefits of thrombolysis with the patient and declining at this time.  Plan to obtain an MRI/MRA.  Patient endorsing some difficulty breathing for some time.  He has difficulty explaining exactly how long its been.  He has been having a little bit of a cough but denies any fevers or chills.  Denies any chest pain or pressure.  Has lower extremity edema that is been going on for about 6 months unsure if it is acutely worse than normal.  We will obtain a troponin and BNP chest x-ray.  Patient lung exam with diminished breath sounds in all fields which may be due to body habitus.  We will give 3 duo nebs back-to-back and reassess.  Patient feels like his breathing is improved somewhat after 3 duo nebs back-to-back.  Repeat lung exam with some improved aeration.  MRI is resulted and is negative for acute stroke.  Plan for second troponin is the first was mildly elevated, ambulate with pulse ox.  If able to do this and the patient feeling better likely COPD exacerbation will discharge home on burst of steroids.  Signed out to Dr. Rubin Payor, please see his note for further details care in the ED.  The patients results and plan were reviewed and discussed.   Any x-rays performed were independently reviewed by myself.   Differential  diagnosis were considered with the presenting HPI.  Medications  sodium chloride flush (NS) 0.9 % injection 3 mL (3 mLs Intravenous Given 06/05/21 0211)  ipratropium-albuterol (DUONEB) 0.5-2.5 (3) MG/3ML nebulizer solution 3 mL (3 mLs Nebulization Given 06/05/21 0244)  methylPREDNISolone sodium succinate (SOLU-MEDROL) 125 mg/2 mL injection 125 mg (125 mg Intravenous Given 06/05/21 0446)  hydrALAZINE (APRESOLINE) tablet 50 mg (50 mg Oral Given 06/05/21 0737)  losartan (COZAAR) tablet 100 mg (100 mg Oral Given 06/05/21  4098)    Vitals:   06/05/21 0730 06/05/21 0740 06/05/21 0800 06/05/21 0848  BP: (!) 192/123 (!) 192/123 (!) 155/98   Pulse: 87 87 70   Resp:  18 10   Temp:  97.9 F (36.6 C)  97.9 F (36.6 C)  TempSrc:  Oral  Oral  SpO2: 99% 99% 98%   Weight:      Height:        Final diagnoses:  COPD with acute exacerbation (HCC)  Dizziness    Final Clinical Impression(s) / ED Diagnoses Final diagnoses:  COPD with acute exacerbation (HCC)  Dizziness    Rx / DC Orders ED Discharge Orders          Ordered    predniSONE (DELTASONE) 10 MG tablet  Daily,   Status:  Discontinued        06/05/21 0657    predniSONE (DELTASONE) 10 MG tablet  Daily        06/05/21 0844             Melene Plan, DO 06/05/21 2334

## 2021-06-05 NOTE — ED Notes (Signed)
MRI delayed for breathing treatments

## 2021-06-05 NOTE — ED Notes (Signed)
Lab called to add on BNP and Trop

## 2021-06-05 NOTE — ED Triage Notes (Signed)
Patient was feeling dizzy and laid down around 2300. Patient woke up at 0020 with generalized weakness, blurred vision, and double vision.

## 2021-08-27 LAB — BASIC METABOLIC PANEL: EGFR: 48

## 2021-09-16 LAB — LAB REPORT - SCANNED: EGFR: 54

## 2022-02-12 LAB — LAB REPORT - SCANNED
A1c: 7.8
Albumin, Urine POC: 138.1
EGFR: 46

## 2022-03-31 DIAGNOSIS — D509 Iron deficiency anemia, unspecified: Secondary | ICD-10-CM

## 2022-03-31 HISTORY — DX: Iron deficiency anemia, unspecified: D50.9

## 2022-10-23 LAB — LAB REPORT - SCANNED
A1c: 9.1
EGFR: 26
PSA, Total: 0.83

## 2022-11-04 DIAGNOSIS — E785 Hyperlipidemia, unspecified: Secondary | ICD-10-CM | POA: Insufficient documentation

## 2022-11-04 DIAGNOSIS — E876 Hypokalemia: Secondary | ICD-10-CM | POA: Insufficient documentation

## 2022-11-04 DIAGNOSIS — E1140 Type 2 diabetes mellitus with diabetic neuropathy, unspecified: Secondary | ICD-10-CM

## 2022-11-04 DIAGNOSIS — E1122 Type 2 diabetes mellitus with diabetic chronic kidney disease: Secondary | ICD-10-CM | POA: Insufficient documentation

## 2022-11-04 DIAGNOSIS — N184 Chronic kidney disease, stage 4 (severe): Secondary | ICD-10-CM

## 2022-11-04 HISTORY — DX: Hypomagnesemia: E83.42

## 2022-11-04 HISTORY — DX: Type 2 diabetes mellitus with diabetic neuropathy, unspecified: E11.40

## 2022-11-04 HISTORY — DX: Chronic kidney disease, stage 4 (severe): N18.4

## 2022-12-08 LAB — HM COLONOSCOPY

## 2022-12-09 DIAGNOSIS — E876 Hypokalemia: Secondary | ICD-10-CM | POA: Diagnosis not present

## 2022-12-09 DIAGNOSIS — E1169 Type 2 diabetes mellitus with other specified complication: Secondary | ICD-10-CM | POA: Diagnosis not present

## 2022-12-09 DIAGNOSIS — Z0000 Encounter for general adult medical examination without abnormal findings: Secondary | ICD-10-CM | POA: Diagnosis not present

## 2022-12-09 DIAGNOSIS — K219 Gastro-esophageal reflux disease without esophagitis: Secondary | ICD-10-CM | POA: Diagnosis not present

## 2022-12-09 DIAGNOSIS — D509 Iron deficiency anemia, unspecified: Secondary | ICD-10-CM | POA: Diagnosis not present

## 2022-12-09 DIAGNOSIS — I129 Hypertensive chronic kidney disease with stage 1 through stage 4 chronic kidney disease, or unspecified chronic kidney disease: Secondary | ICD-10-CM | POA: Diagnosis not present

## 2022-12-09 DIAGNOSIS — E785 Hyperlipidemia, unspecified: Secondary | ICD-10-CM | POA: Diagnosis not present

## 2022-12-09 DIAGNOSIS — J449 Chronic obstructive pulmonary disease, unspecified: Secondary | ICD-10-CM | POA: Diagnosis not present

## 2022-12-09 DIAGNOSIS — N1831 Chronic kidney disease, stage 3a: Secondary | ICD-10-CM | POA: Diagnosis not present

## 2022-12-09 DIAGNOSIS — Z6841 Body Mass Index (BMI) 40.0 and over, adult: Secondary | ICD-10-CM | POA: Diagnosis not present

## 2022-12-18 DIAGNOSIS — E1169 Type 2 diabetes mellitus with other specified complication: Secondary | ICD-10-CM | POA: Diagnosis not present

## 2022-12-18 DIAGNOSIS — L97909 Non-pressure chronic ulcer of unspecified part of unspecified lower leg with unspecified severity: Secondary | ICD-10-CM | POA: Diagnosis not present

## 2022-12-18 DIAGNOSIS — I83009 Varicose veins of unspecified lower extremity with ulcer of unspecified site: Secondary | ICD-10-CM | POA: Diagnosis not present

## 2022-12-18 DIAGNOSIS — E261 Secondary hyperaldosteronism: Secondary | ICD-10-CM | POA: Diagnosis not present

## 2022-12-18 DIAGNOSIS — E785 Hyperlipidemia, unspecified: Secondary | ICD-10-CM | POA: Diagnosis not present

## 2022-12-18 DIAGNOSIS — N1831 Chronic kidney disease, stage 3a: Secondary | ICD-10-CM | POA: Diagnosis not present

## 2022-12-18 DIAGNOSIS — I129 Hypertensive chronic kidney disease with stage 1 through stage 4 chronic kidney disease, or unspecified chronic kidney disease: Secondary | ICD-10-CM | POA: Diagnosis not present

## 2022-12-18 DIAGNOSIS — Z79899 Other long term (current) drug therapy: Secondary | ICD-10-CM | POA: Diagnosis not present

## 2022-12-18 DIAGNOSIS — I509 Heart failure, unspecified: Secondary | ICD-10-CM | POA: Diagnosis not present

## 2022-12-18 DIAGNOSIS — Z125 Encounter for screening for malignant neoplasm of prostate: Secondary | ICD-10-CM | POA: Diagnosis not present

## 2022-12-18 DIAGNOSIS — Z0001 Encounter for general adult medical examination with abnormal findings: Secondary | ICD-10-CM | POA: Diagnosis not present

## 2022-12-18 DIAGNOSIS — I700 Atherosclerosis of aorta: Secondary | ICD-10-CM | POA: Diagnosis not present

## 2022-12-18 DIAGNOSIS — Z6841 Body Mass Index (BMI) 40.0 and over, adult: Secondary | ICD-10-CM | POA: Diagnosis not present

## 2022-12-30 DIAGNOSIS — E1169 Type 2 diabetes mellitus with other specified complication: Secondary | ICD-10-CM | POA: Diagnosis not present

## 2023-01-29 DIAGNOSIS — E1165 Type 2 diabetes mellitus with hyperglycemia: Secondary | ICD-10-CM | POA: Diagnosis not present

## 2023-02-18 DIAGNOSIS — L97909 Non-pressure chronic ulcer of unspecified part of unspecified lower leg with unspecified severity: Secondary | ICD-10-CM | POA: Diagnosis not present

## 2023-02-18 DIAGNOSIS — Z0000 Encounter for general adult medical examination without abnormal findings: Secondary | ICD-10-CM | POA: Diagnosis not present

## 2023-02-18 DIAGNOSIS — R609 Edema, unspecified: Secondary | ICD-10-CM | POA: Diagnosis not present

## 2023-02-18 DIAGNOSIS — E1169 Type 2 diabetes mellitus with other specified complication: Secondary | ICD-10-CM | POA: Diagnosis not present

## 2023-02-18 DIAGNOSIS — N184 Chronic kidney disease, stage 4 (severe): Secondary | ICD-10-CM | POA: Diagnosis not present

## 2023-02-18 DIAGNOSIS — Z7984 Long term (current) use of oral hypoglycemic drugs: Secondary | ICD-10-CM | POA: Diagnosis not present

## 2023-02-18 DIAGNOSIS — I130 Hypertensive heart and chronic kidney disease with heart failure and stage 1 through stage 4 chronic kidney disease, or unspecified chronic kidney disease: Secondary | ICD-10-CM | POA: Diagnosis not present

## 2023-02-18 DIAGNOSIS — I83009 Varicose veins of unspecified lower extremity with ulcer of unspecified site: Secondary | ICD-10-CM | POA: Diagnosis not present

## 2023-02-18 DIAGNOSIS — Z6841 Body Mass Index (BMI) 40.0 and over, adult: Secondary | ICD-10-CM | POA: Diagnosis not present

## 2023-02-28 DIAGNOSIS — E1165 Type 2 diabetes mellitus with hyperglycemia: Secondary | ICD-10-CM | POA: Diagnosis not present

## 2023-03-30 ENCOUNTER — Encounter: Payer: Self-pay | Admitting: Family Medicine

## 2023-03-30 DIAGNOSIS — E1165 Type 2 diabetes mellitus with hyperglycemia: Secondary | ICD-10-CM | POA: Diagnosis not present

## 2023-03-30 DIAGNOSIS — J449 Chronic obstructive pulmonary disease, unspecified: Secondary | ICD-10-CM | POA: Insufficient documentation

## 2023-03-30 DIAGNOSIS — I10 Essential (primary) hypertension: Secondary | ICD-10-CM | POA: Insufficient documentation

## 2023-03-30 DIAGNOSIS — E782 Mixed hyperlipidemia: Secondary | ICD-10-CM

## 2023-03-30 HISTORY — DX: Chronic obstructive pulmonary disease, unspecified: J44.9

## 2023-03-30 HISTORY — DX: Essential (primary) hypertension: I10

## 2023-03-30 HISTORY — DX: Mixed hyperlipidemia: E78.2

## 2023-03-31 ENCOUNTER — Ambulatory Visit (INDEPENDENT_AMBULATORY_CARE_PROVIDER_SITE_OTHER): Payer: Medicare HMO | Admitting: Family Medicine

## 2023-03-31 ENCOUNTER — Telehealth: Payer: Self-pay | Admitting: Family Medicine

## 2023-03-31 ENCOUNTER — Encounter: Payer: Self-pay | Admitting: Family Medicine

## 2023-03-31 VITALS — BP 140/70 | HR 76 | Temp 97.7°F | Wt 337.0 lb

## 2023-03-31 DIAGNOSIS — Z794 Long term (current) use of insulin: Secondary | ICD-10-CM

## 2023-03-31 DIAGNOSIS — J449 Chronic obstructive pulmonary disease, unspecified: Secondary | ICD-10-CM

## 2023-03-31 DIAGNOSIS — K219 Gastro-esophageal reflux disease without esophagitis: Secondary | ICD-10-CM

## 2023-03-31 DIAGNOSIS — B353 Tinea pedis: Secondary | ICD-10-CM

## 2023-03-31 DIAGNOSIS — E1122 Type 2 diabetes mellitus with diabetic chronic kidney disease: Secondary | ICD-10-CM | POA: Diagnosis not present

## 2023-03-31 DIAGNOSIS — G4733 Obstructive sleep apnea (adult) (pediatric): Secondary | ICD-10-CM

## 2023-03-31 DIAGNOSIS — Z6841 Body Mass Index (BMI) 40.0 and over, adult: Secondary | ICD-10-CM | POA: Diagnosis not present

## 2023-03-31 DIAGNOSIS — E782 Mixed hyperlipidemia: Secondary | ICD-10-CM

## 2023-03-31 DIAGNOSIS — N1832 Chronic kidney disease, stage 3b: Secondary | ICD-10-CM | POA: Diagnosis not present

## 2023-03-31 DIAGNOSIS — R600 Localized edema: Secondary | ICD-10-CM

## 2023-03-31 DIAGNOSIS — N184 Chronic kidney disease, stage 4 (severe): Secondary | ICD-10-CM | POA: Insufficient documentation

## 2023-03-31 DIAGNOSIS — I5022 Chronic systolic (congestive) heart failure: Secondary | ICD-10-CM

## 2023-03-31 DIAGNOSIS — E876 Hypokalemia: Secondary | ICD-10-CM

## 2023-03-31 DIAGNOSIS — E1142 Type 2 diabetes mellitus with diabetic polyneuropathy: Secondary | ICD-10-CM

## 2023-03-31 DIAGNOSIS — I872 Venous insufficiency (chronic) (peripheral): Secondary | ICD-10-CM

## 2023-03-31 DIAGNOSIS — D509 Iron deficiency anemia, unspecified: Secondary | ICD-10-CM

## 2023-03-31 DIAGNOSIS — I10 Essential (primary) hypertension: Secondary | ICD-10-CM | POA: Diagnosis not present

## 2023-03-31 HISTORY — DX: Obstructive sleep apnea (adult) (pediatric): G47.33

## 2023-03-31 HISTORY — DX: Type 2 diabetes mellitus with diabetic polyneuropathy: E11.42

## 2023-03-31 HISTORY — DX: Tinea pedis: B35.3

## 2023-03-31 HISTORY — DX: Chronic kidney disease, stage 4 (severe): N18.4

## 2023-03-31 HISTORY — DX: Gastro-esophageal reflux disease without esophagitis: K21.9

## 2023-03-31 HISTORY — DX: Localized edema: R60.0

## 2023-03-31 HISTORY — DX: Chronic systolic (congestive) heart failure: I50.22

## 2023-03-31 HISTORY — DX: Venous insufficiency (chronic) (peripheral): I87.2

## 2023-03-31 LAB — COMPREHENSIVE METABOLIC PANEL
ALT: 9 U/L (ref 0–53)
AST: 12 U/L (ref 0–37)
Albumin: 3.4 g/dL — ABNORMAL LOW (ref 3.5–5.2)
Alkaline Phosphatase: 73 U/L (ref 39–117)
BUN: 38 mg/dL — ABNORMAL HIGH (ref 6–23)
CO2: 24 mEq/L (ref 19–32)
Calcium: 8.5 mg/dL (ref 8.4–10.5)
Chloride: 107 mEq/L (ref 96–112)
Creatinine, Ser: 2.69 mg/dL — ABNORMAL HIGH (ref 0.40–1.50)
GFR: 24.13 mL/min — ABNORMAL LOW (ref >60.00)
Glucose, Bld: 66 mg/dL — ABNORMAL LOW (ref 70–99)
Potassium: 4.2 mEq/L (ref 3.5–5.1)
Sodium: 140 mEq/L (ref 135–145)
Total Bilirubin: 0.4 mg/dL (ref 0.2–1.2)
Total Protein: 7.9 g/dL (ref 6.0–8.3)

## 2023-03-31 LAB — LIPID PANEL
Cholesterol: 165 mg/dL (ref 0–200)
HDL: 30.5 mg/dL — ABNORMAL LOW (ref >39.00)
NonHDL: 134.74
Total CHOL/HDL Ratio: 5
Triglycerides: 220 mg/dL — ABNORMAL HIGH (ref 0.0–149.0)
VLDL: 44 mg/dL — ABNORMAL HIGH (ref 0.0–40.0)

## 2023-03-31 LAB — SEDIMENTATION RATE: Sed Rate: >130 mm/hr — ABNORMAL HIGH (ref 0–20)

## 2023-03-31 LAB — MICROALBUMIN / CREATININE URINE RATIO
Creatinine,U: 57.5 mg/dL
Microalb Creat Ratio: 321.5 mg/g — ABNORMAL HIGH (ref 0.0–30.0)
Microalb, Ur: 184.7 mg/dL — ABNORMAL HIGH (ref 0.0–1.9)

## 2023-03-31 LAB — CBC WITH DIFFERENTIAL/PLATELET
Basophils Absolute: 0.1 10*3/uL (ref 0.0–0.1)
Basophils Relative: 1.3 % (ref 0.0–3.0)
Eosinophils Absolute: 0.4 10*3/uL (ref 0.0–0.7)
Eosinophils Relative: 3.8 % (ref 0.0–5.0)
HCT: 29.1 % — ABNORMAL LOW (ref 39.0–52.0)
Hemoglobin: 9.2 g/dL — ABNORMAL LOW (ref 13.0–17.0)
Lymphocytes Relative: 22.7 % (ref 12.0–46.0)
Lymphs Abs: 2.2 10*3/uL (ref 0.7–4.0)
MCHC: 31.7 g/dL (ref 30.0–36.0)
MCV: 74.2 fl — ABNORMAL LOW (ref 78.0–100.0)
Monocytes Absolute: 1 10*3/uL (ref 0.1–1.0)
Monocytes Relative: 10.2 % (ref 3.0–12.0)
Neutro Abs: 6 10*3/uL (ref 1.4–7.7)
Neutrophils Relative %: 62 % (ref 43.0–77.0)
Platelets: 259 10*3/uL (ref 150.0–400.0)
RBC: 3.92 Mil/uL — ABNORMAL LOW (ref 4.22–5.81)
RDW: 18.5 % — ABNORMAL HIGH (ref 11.5–15.5)
WBC: 9.7 10*3/uL (ref 4.0–10.5)

## 2023-03-31 LAB — HEMOGLOBIN A1C: Hgb A1c MFr Bld: 7.6 % — ABNORMAL HIGH (ref 4.6–6.5)

## 2023-03-31 LAB — LDL CHOLESTEROL, DIRECT: Direct LDL: 106 mg/dL

## 2023-03-31 LAB — C-REACTIVE PROTEIN: CRP: <1 mg/dL (ref 0.5–20.0)

## 2023-03-31 MED ORDER — OMEPRAZOLE 20 MG PO CPDR
20.0000 mg | DELAYED_RELEASE_CAPSULE | Freq: Every day | ORAL | 3 refills | Status: DC
Start: 2023-03-31 — End: 2024-01-27

## 2023-03-31 NOTE — Assessment & Plan Note (Signed)
Recommend use of athlete's foot cream, however, he does have challenges in being able to apply this regularly.

## 2023-03-31 NOTE — Assessment & Plan Note (Signed)
Appears compensated, but this is my first visit with the patient. Significant lower leg edema is likely multifactorial. Continue carvedilol 25 mg bid, losartan 100 mg daily, and torsemide 20 mg 2 tabs bid. I will refer him to cardiology for guidance on management.

## 2023-03-31 NOTE — Assessment & Plan Note (Signed)
Blood pressure is elevated, but apparently in better control than in the past. Continue carvedilol 25 mg bid, losartan 100 mg daily, and amlodipine 10 mg daily.

## 2023-03-31 NOTE — Assessment & Plan Note (Signed)
Likely multifactorial I will refer to vascular for an assessment of his underlying cause. He should continue torsemide 20 mg two tabs bid.

## 2023-03-31 NOTE — Assessment & Plan Note (Signed)
I will reassess his CBC today.

## 2023-03-31 NOTE — Assessment & Plan Note (Signed)
The lower legs have significant skin changes and there is a question of cellulitis. I will check a CBC, ESR, and CRP. I will refer him to wound care to see if we can improve his skin condition.

## 2023-03-31 NOTE — Assessment & Plan Note (Signed)
Stable.  Continue omeprazole 20mg daily

## 2023-03-31 NOTE — Progress Notes (Signed)
[facility] [address] [place] [address] Dept: [phone] Dept Fax: [phone]  New Patient Office Visit  Subjective:    Patient ID: Peter Terry, male    DOB: 1955/11/09, 67 y.o..   MRN: [id number]  Chief Complaint  Patient presents with   Establish Care    Refill of omeprazole 20 mg    Cellulitis    Bilateral leg swelling, oozing and odor   History of Present Illness:  Patient is in today to establish care. Mr. Lubrano was born in [place], [place]. He moved to [place], [place] at age 85. IN 1994, he moved to [place]. He is currently single and lives along. He has a son (30) who lives locally. He notes he quit tobacco use many years ago. he denies any alcohol or drug use.  Mr. Carnahan has a complex medical history. He has Type 2 diabetes diagnosed 20+ years ago. He is currently managed on metformin 1,000 mg bid, Basaglar insulin 35 units twice daily and Humalog 75/25 25 units twice daily.   Mr. Brinton has a history of an admission this past year at [facility] with decompensated HFpEF. He is managed on carvedilol 25 mg bid, losartan 100 mg daily, and torsemide 20 mg 2 tabs bid. He has chronic lower leg edema, apparently secondary to venous stasis and CHF. He has chronic stasis dermatitis. Due to his morbid obesity, he is not able to reach his lower legs. He is dependent on his sister to help him with occasional lower leg care.   Mr. Deberg has hypertension. He is managed on carvedilol 25 mg bid, losartan 100 mg daily, and amlodipine 10 mg daily.  Mr. Montalbano has a history of hyperlipidemia. He is managed on atorvastatin 80 mg daily.  Mr. Mcmanigal has a history of COPD and OSA. He notes he was provided with a CPAP int he past, but could not tolerate the mask. he uses albuterol on occasion, esp. when the air is very humid.  Past Medical History: Patient Active Problem List   Diagnosis Date Noted   Stage 3b chronic kidney  disease (CKD) (HCC) 03/31/2023   Obstructive sleep apnea 03/31/2023   GERD (gastroesophageal reflux disease) 03/31/2023   Edema of both lower legs 03/31/2023   Venous stasis dermatitis 03/31/2023   Tinea pedis 03/31/2023   Heart Failure with Preserved EF (HCC) 03/31/2023   Mixed hyperlipidemia 03/30/2023   Essential hypertension 03/30/2023   COPD (chronic obstructive pulmonary disease) (HCC) 03/30/2023   Type 2 diabetes mellitus with stage 3b chronic kidney disease, with long-term current use of insulin (HCC) 11/04/2022   Hypomagnesemia 11/04/2022   Hypokalemia 11/04/2022   Iron deficiency anemia 03/31/2022   Insulin long-term use (HCC) 08/21/2020   Morbid obesity with BMI of 45.0-49.9, adult (HCC) 07/25/2020   Retinal tear 02/20/2013   Past Surgical History:  Procedure Laterality Date   BACK SURGERY     History reviewed. No pertinent family history. Outpatient Medications Prior to Visit  Medication Sig Dispense Refill   albuterol (PROVENTIL HFA;VENTOLIN HFA) 108 (90 BASE) MCG/ACT inhaler Inhale 2 puffs into the lungs every 4 (four) hours as needed for wheezing. 1 Inhaler 0   amLODipine (NORVASC) 10 MG tablet Take 10 mg by mouth every morning.     atorvastatin (LIPITOR) 80 MG tablet Take 80 mg by mouth at bedtime.     carvedilol (COREG) 25 MG tablet Take 25 mg by mouth 2 (two) times daily.  Continuous Glucose Sensor (DEXCOM G7 SENSOR) MISC by Does not apply route.     ferrous sulfate 325 (65 FE) MG EC tablet Take 325 mg by mouth 3 (three) times daily with meals.     hydrALAZINE (APRESOLINE) 50 MG tablet Take 50 mg by mouth 3 (three) times daily.     ibuprofen (ADVIL) 200 MG tablet Take 400 mg by mouth every 6 (six) hours as needed for headache or moderate pain.     Insulin Glargine (LANTUS SOLOSTAR [place]) Inject 35 Units into the skin in the morning and at bedtime.     Insulin Lispro Prot & Lispro (HUMALOG 75/25 MIX) (75-25) 100 UNIT/ML Kwikpen Inject 25 Units into the skin 2 (two)  times daily with a meal.     losartan (COZAAR) 100 MG tablet Take 100 mg by mouth daily.     metFORMIN (GLUCOPHAGE-XR) 500 MG 24 hr tablet Take 1,000 mg by mouth 2 (two) times daily.     potassium chloride (MICRO-K) 10 MEQ CR capsule Take 10 mEq by mouth daily.     torsemide (DEMADEX) 20 MG tablet Take 20 mg by mouth daily.     omeprazole (PRILOSEC) 20 MG capsule Take 20 mg by mouth daily.     furosemide (LASIX) 40 MG tablet Take 40 mg by mouth daily.     hydrochlorothiazide (HYDRODIURIL) 25 MG tablet Take 25 mg by mouth daily. (Patient not taking: Reported on 03/31/2023)     No facility-administered medications prior to visit.   No Known Allergies Objective:   Today's Vitals   03/31/23 1250  BP: (!) 140/70  Pulse: 76  Temp: 97.7 F (36.5 C)  SpO2: 97%  Weight: (!) 337 lb (152.9 kg)   Body mass index is 49.77 kg/m.   General: Obese male. No acute distress. Lungs: Clear to auscultation bilaterally, though breath sounds are distant. No wheezing, rales or rhonchi. CV: RRR without murmurs or rubs.  Abdomen: Soft, non-tender. Bowel sounds positive, normal pitch and frequency. No hepatosplenomegaly. No rebound or guarding. Extremities: The lower legs have 4+ edema present. There are marked skin changes to both lower legs and feet with reptilian   appearance to the skin. There are areas that are seeping clear fluid and there is some odor to the legs. Feet- Chronic skin changes. Maceration between multiple toes. Nails are thickened. Dorsalis pedis and posterior tibial artery pulses are   indistinct due to edema. Sensation absent with 5.07 monofilament testing. Psych: Alert and oriented. Normal mood and affect.  Health Maintenance Due  Topic Date Due   HEMOGLOBIN A1C  Never done   Medicare Annual Wellness (AWV)  Never done   Pneumonia Vaccine 90+ Years old (1 of 2 - PCV) Never done   FOOT EXAM  Never done   OPHTHALMOLOGY EXAM  Never done   HIV Screening  Never done   Diabetic kidney  evaluation - Urine ACR  Never done   Hepatitis C Screening  Never done   DTaP/Tdap/Td (1 - Tdap) Never done   Zoster Vaccines- Shingrix (1 of 2) Never done   COVID-19 Vaccine (1 - 2023-24 season) Never done   Diabetic kidney evaluation - eGFR measurement  06/05/2022   INFLUENZA VACCINE  03/11/2023     Assessment & Plan:   Problem List Items Addressed This Visit       Cardiovascular and Mediastinum   Essential hypertension    Blood pressure is elevated, but apparently in better control than in the past. Continue carvedilol 25  mg bid, losartan 100 mg daily, and amlodipine 10 mg daily.       Relevant Medications   torsemide (DEMADEX) 20 MG tablet   Heart Failure with Preserved EF (HCC)    Appears compensated, but this is my first visit with the patient. Significant lower leg edema is likely multifactorial. Continue carvedilol 25 mg bid, losartan 100 mg daily, and torsemide 20 mg 2 tabs bid. I will refer him to cardiology for guidance on management.      Relevant Medications   torsemide (DEMADEX) 20 MG tablet   Other Relevant Orders   Ambulatory referral to Cardiology     Respiratory   COPD (chronic obstructive pulmonary disease) (HCC)    Stable. Continue albuterol inhaler as needed.        Digestive   GERD (gastroesophageal reflux disease)    Stable. Continue omeprazole 20 mg daily.      Relevant Medications   omeprazole (PRILOSEC) 20 MG capsule     Endocrine   Diabetic peripheral neuropathy (HCC)    At high risk for foot infections.      Relevant Medications   Insulin Glargine (BASAGLAR KWIKPEN) 100 UNIT/ML   Other Relevant Orders   Ambulatory referral to Wound Clinic   Type 2 diabetes mellitus with stage 3b chronic kidney disease, with long-term current use of insulin (HCC) - Primary    I will check annual DM labs. For now, continue metformin 1,000 mg bid, Basaglar insulin 35 units twice daily and Humalog 75/25 25 units twice daily.      Relevant Medications    Insulin Glargine (BASAGLAR KWIKPEN) 100 UNIT/ML   Other Relevant Orders   Hemoglobin A1c   Microalbumin / creatinine urine ratio   Comprehensive metabolic panel     Musculoskeletal and Integument   Tinea pedis    Recommend use of athlete's foot cream, however, he does have challenges in being able to apply this regularly.      Venous stasis dermatitis    The lower legs have significant skin changes and there is a question of cellulitis. I will check a CBC, ESR, and CRP. I will refer him to wound care to see if we can improve his skin condition.      Relevant Orders   Ambulatory referral to Wound Clinic   Ambulatory referral to Vascular Surgery     Genitourinary   Stage 3b chronic kidney disease (CKD) (HCC)    I will assess current renal function, as his records indicate this may have progressed to stage 4 earlier this year.      Relevant Orders   Microalbumin / creatinine urine ratio     Other   Edema of both lower legs    Likely multifactorial I will refer to vascular for an assessment of his underlying cause. He should continue torsemide 20 mg two tabs bid.      Relevant Orders   Comprehensive metabolic panel   CBC with Differential/Platelet   C-reactive protein   Sedimentation rate   Hypokalemia    I will recheck his potassium level today. hsi torsemide use is the likely underlying cause of this.      Iron deficiency anemia    I will reassess his CBC today.      Relevant Medications   ferrous sulfate 325 (65 FE) MG EC tablet   Mixed hyperlipidemia    I will check lipids. Continue atorvastatin 80 mg daily.      Relevant Medications   torsemide (DEMADEX) 20  MG tablet   Other Relevant Orders   Lipid panel   Morbid obesity with BMI of 45.0-49.9, adult (HCC)    His current weight significantly impacts his health. If his diabetes is not at goal. I would strongly consider starting him on Mounjaro.      Relevant Medications   Insulin Glargine (BASAGLAR KWIKPEN)  100 UNIT/ML   I spent 70 min. reviewing prior records, obtaining a detailed history, performing a focused physical exam, developing and initial plan for assessment, and documenting care.  Return in about 2 weeks (around 04/14/2023).   [name], MD

## 2023-03-31 NOTE — Assessment & Plan Note (Signed)
I will check lipids. Continue atorvastatin 80 mg daily.

## 2023-03-31 NOTE — Assessment & Plan Note (Signed)
Stable. Continue albuterol inhaler as needed

## 2023-03-31 NOTE — Assessment & Plan Note (Signed)
His current weight significantly impacts his health. If his diabetes is not at goal. I would strongly consider starting him on Mounjaro.

## 2023-03-31 NOTE — Assessment & Plan Note (Signed)
I will assess current renal function, as his records indicate this may have progressed to stage 4 earlier this year.

## 2023-03-31 NOTE — Assessment & Plan Note (Signed)
I will check annual DM labs. For now, continue metformin 1,000 mg bid, Basaglar insulin 35 units twice daily and Humalog 75/25 25 units twice daily.

## 2023-03-31 NOTE — Assessment & Plan Note (Signed)
I will recheck his potassium level today. hsi torsemide use is the likely underlying cause of this.

## 2023-03-31 NOTE — Assessment & Plan Note (Signed)
At high risk for foot infections.

## 2023-04-01 NOTE — Telephone Encounter (Signed)
error 

## 2023-04-14 ENCOUNTER — Ambulatory Visit (INDEPENDENT_AMBULATORY_CARE_PROVIDER_SITE_OTHER): Payer: Medicare HMO | Admitting: Family Medicine

## 2023-04-14 ENCOUNTER — Encounter: Payer: Self-pay | Admitting: Family Medicine

## 2023-04-14 VITALS — BP 170/84 | HR 71 | Temp 97.6°F | Ht 69.0 in | Wt 332.6 lb

## 2023-04-14 DIAGNOSIS — N184 Chronic kidney disease, stage 4 (severe): Secondary | ICD-10-CM | POA: Diagnosis not present

## 2023-04-14 DIAGNOSIS — I10 Essential (primary) hypertension: Secondary | ICD-10-CM

## 2023-04-14 DIAGNOSIS — R600 Localized edema: Secondary | ICD-10-CM | POA: Diagnosis not present

## 2023-04-14 DIAGNOSIS — D509 Iron deficiency anemia, unspecified: Secondary | ICD-10-CM

## 2023-04-14 DIAGNOSIS — I872 Venous insufficiency (chronic) (peripheral): Secondary | ICD-10-CM

## 2023-04-14 MED ORDER — LOSARTAN POTASSIUM 100 MG PO TABS
100.0000 mg | ORAL_TABLET | Freq: Every day | ORAL | 3 refills | Status: DC
Start: 2023-04-14 — End: 2023-06-10

## 2023-04-14 MED ORDER — CEPHALEXIN 500 MG PO CAPS
500.0000 mg | ORAL_CAPSULE | Freq: Four times a day (QID) | ORAL | 0 refills | Status: AC
Start: 2023-04-14 — End: ?

## 2023-04-14 MED ORDER — FERROUS SULFATE 325 (65 FE) MG PO TBEC
325.0000 mg | DELAYED_RELEASE_TABLET | Freq: Three times a day (TID) | ORAL | 3 refills | Status: AC
Start: 2023-04-14 — End: ?

## 2023-04-14 MED ORDER — AMLODIPINE BESYLATE 10 MG PO TABS
10.0000 mg | ORAL_TABLET | Freq: Every morning | ORAL | 3 refills | Status: DC
Start: 2023-04-14 — End: 2023-09-03

## 2023-04-14 NOTE — Assessment & Plan Note (Signed)
Renal labs show progression to Stage 4 CKD. I will refer to nephrology.

## 2023-04-14 NOTE — Assessment & Plan Note (Signed)
Likely multifactorial. Vascular assessment pending. He should continue torsemide 20 mg two tabs bid.

## 2023-04-14 NOTE — Assessment & Plan Note (Signed)
Blood pressure is elevated, but patient has been off of some meds. Continue carvedilol 25 mg bid, losartan 100 mg daily, and amlodipine 10 mg daily (refill losartan and amlodipine).

## 2023-04-14 NOTE — Assessment & Plan Note (Signed)
The lower legs have significant skin changes and I remain worried for cellulitis in light of elevated sed rate. Cleaned the lower leg wounds today and redressed with Telfa pads, gauze, and Coban. I will prescribe a course of Keflex for possible infection. As he cannot get into the Wound Clinic until 9/24, I will go ahead and order Home Health for nursing assistance with dressing changes.

## 2023-04-14 NOTE — Assessment & Plan Note (Signed)
Remains anemic. This is likely multifactorial. I will renew his iron supplements.

## 2023-04-14 NOTE — Progress Notes (Signed)
[facility] [address] [place] [address] Dept: [phone] Dept Fax: [phone]  Chronic Care Office Visit  Subjective:    Patient ID: Peter Terry, male    DOB: August 05, 1956, 67 y.o..   MRN: [id number]  Chief Complaint  Patient presents with   Follow-up    2 week f/u cellulitis in legs.     History of Present Illness:  Patient is in today for reassessment of chronic medical issues.  Mr. Hold returns for evaluation of his lower leg edema and chronic venous stasis changes. He ntoes his last dressing change was a week ago. I had referred him to the Wound Clinic, but their first available appointment is in 3 weeks. I had also referred to the vein clinic, but he cannot be seen until Oct. 3rd.  [name] has a Type 2 diabetes diagnosed 20+ years ago. He is currently managed on metformin 1,000 mg bid, Basaglar insulin 35 units twice daily and Humalog 75/25 25 units twice daily.    [name] has HFpEF. He is managed on carvedilol 25 mg bid, losartan 100 mg daily, and torsemide 20 mg 2 tabs bid.    [name] has hypertension. He is managed on carvedilol 25 mg bid, losartan 100 mg daily, and amlodipine 10 mg daily. He notes he ahs been out of two of his meds, so has not had these for about 4 days.   [name] has a history of hyperlipidemia. He is managed on atorvastatin 80 mg daily.  Past Medical History: Patient Active Problem List   Diagnosis Date Noted   Chronic kidney disease, stage 4 (severe) (HCC) 03/31/2023   Obstructive sleep apnea 03/31/2023   GERD (gastroesophageal reflux disease) 03/31/2023   Edema of both lower legs 03/31/2023   Venous stasis dermatitis 03/31/2023   Tinea pedis 03/31/2023   Heart Failure with Preserved EF (HCC) 03/31/2023   Diabetic peripheral neuropathy (HCC) 03/31/2023   Mixed hyperlipidemia 03/30/2023   Essential hypertension 03/30/2023   COPD (chronic obstructive pulmonary disease) (HCC)  03/30/2023   Type 2 diabetes mellitus with stage 4 chronic kidney disease, with long-term current use of insulin (HCC) 11/04/2022   Hypomagnesemia 11/04/2022   Hypokalemia 11/04/2022   Iron deficiency anemia 03/31/2022   Insulin long-term use (HCC) 08/21/2020   Morbid obesity with BMI of 45.0-49.9, adult (HCC) 07/25/2020   Retinal tear 02/20/2013   Past Surgical History:  Procedure Laterality Date   APPENDECTOMY     LUMBAR DISC SURGERY     L4-L5   Family History  Problem Relation Age of Onset   Cancer Mother    Diabetes Mother    Kidney disease Mother    Heart disease Father    Cancer Father        Colon   Kidney disease Father    Diabetes Father    Diabetes Brother    Heart disease Paternal Aunt    Outpatient Medications Prior to Visit  Medication Sig Dispense Refill   albuterol (PROVENTIL HFA;VENTOLIN HFA) 108 (90 BASE) MCG/ACT inhaler Inhale 2 puffs into the lungs every 4 (four) hours as needed for wheezing. 1 Inhaler 0   atorvastatin (LIPITOR) 80 MG tablet Take 80 mg by mouth at bedtime.     carvedilol (COREG) 25 MG tablet Take 25 mg by mouth 2 (two) times daily.     Continuous Glucose Sensor (DEXCOM G7 SENSOR) MISC by Does not apply route.     hydrALAZINE (APRESOLINE) 50 MG tablet  Take 50 mg by mouth 3 (three) times daily.     ibuprofen (ADVIL) 200 MG tablet Take 400 mg by mouth every 6 (six) hours as needed for headache or moderate pain.     Insulin Glargine (BASAGLAR KWIKPEN) 100 UNIT/ML Inject 35 Units into the skin 2 (two) times daily.     Insulin Lispro Prot & Lispro (HUMALOG 75/25 MIX) (75-25) 100 UNIT/ML Kwikpen Inject 25 Units into the skin 2 (two) times daily with a meal.     metFORMIN (GLUCOPHAGE-XR) 500 MG 24 hr tablet Take 1,000 mg by mouth 2 (two) times daily.     omeprazole (PRILOSEC) 20 MG capsule Take 1 capsule (20 mg total) by mouth daily. 90 capsule 3   potassium chloride (MICRO-K) 10 MEQ CR capsule Take 10 mEq by mouth daily.     torsemide (DEMADEX) 20  MG tablet Take 20 mg by mouth daily.     amLODipine (NORVASC) 10 MG tablet Take 10 mg by mouth every morning.     ferrous sulfate 325 (65 FE) MG EC tablet Take 325 mg by mouth 3 (three) times daily with meals.     losartan (COZAAR) 100 MG tablet Take 100 mg by mouth daily.     No facility-administered medications prior to visit.   No Known Allergies Objective:   Today's Vitals   04/14/23 1348  BP: (!) 170/84  Pulse: 71  Temp: 97.6 F (36.4 C)  TempSrc: Temporal  SpO2: 99%  Weight: (!) 332 lb 9.6 oz (150.9 kg)  Height: 5\' 9"  (1.753 m)   Body mass index is 49.12 kg/m.   General: Well developed, well nourished. No acute distress. Extremities: The lower legs have 4+ edema present, with some woody edema. There are marked   skin changes to both lower legs with superficial erosions in large patches and malodorous discharge. Psych: Alert and oriented. Normal mood and affect.  Health Maintenance Due  Topic Date Due   Medicare Annual Wellness (AWV)  Never done   Pneumonia Vaccine 4+ Years old (1 of 2 - PCV) Never done   OPHTHALMOLOGY EXAM  Never done   HIV Screening  Never done   Hepatitis C Screening  Never done   DTaP/Tdap/Td (1 - Tdap) Never done   Zoster Vaccines- Shingrix (1 of 2) Never done   Lab Results    Latest Ref Rng & Units 03/31/2023   11:18 AM 06/05/2021    1:47 AM 06/05/2021    1:42 AM  CBC  WBC 4.0 - 10.5 K/uL 9.7   8.7   Hemoglobin 13.0 - 17.0 g/dL 9.2  40.9  81.1   Hematocrit 39.0 - 52.0 % 29.1  34.0  34.0   Platelets 150.0 - 400.0 K/uL 259.0   248        Latest Ref Rng & Units 03/31/2023   11:18 AM 06/05/2021    1:47 AM 06/05/2021    1:42 AM  CMP  Glucose 70 - 99 mg/dL 66  93  93   BUN 6 - 23 mg/dL 38  23  22   Creatinine 0.40 - 1.50 mg/dL 9.14  7.82  9.56   Sodium 135 - 145 mEq/L 140  145  141   Potassium 3.5 - 5.1 mEq/L 4.2  3.9  4.0   Chloride 96 - 112 mEq/L 107  108  109   CO2 19 - 32 mEq/L 24   25   Calcium 8.4 - 10.5 mg/dL 8.5   9.0    Total  Protein 6.0 - 8.3 g/dL 7.9   8.1   Total Bilirubin 0.2 - 1.2 mg/dL 0.4   0.7   Alkaline Phos 39 - 117 U/L 73   57   AST 0 - 37 U/L 12   19   ALT 0 - 53 U/L 9   12     Last lipids Lab Results  Component Value Date   CHOL 165 03/31/2023   HDL 30.50 (L) 03/31/2023   LDLDIRECT 106.0 03/31/2023   TRIG 220.0 (H) 03/31/2023   CHOLHDL 5 03/31/2023   Last hemoglobin A1c Lab Results  Component Value Date   HGBA1C 7.6 (H) 03/31/2023   Lab Results  Component Value Date   CRP <1.0 03/31/2023   Lab Results  Component Value Date   ESRSEDRATE >130 (H) 03/31/2023   Lab Results  Component Value Date   MICROALBUR 184.7 (H) 03/31/2023     Assessment & Plan:   Problem List Items Addressed This Visit       Cardiovascular and Mediastinum   Essential hypertension    Blood pressure is elevated, but patient has been off of some meds. Continue carvedilol 25 mg bid, losartan 100 mg daily, and amlodipine 10 mg daily (refill losartan and amlodipine).       Relevant Medications   amLODipine (NORVASC) 10 MG tablet   losartan (COZAAR) 100 MG tablet     Musculoskeletal and Integument   Venous stasis dermatitis - Primary    The lower legs have significant skin changes and I remain worried for cellulitis in light of elevated sed rate. Cleaned the lower leg wounds today and redressed with Telfa pads, gauze, and Coban. I will prescribe a course of Keflex for possible infection. As he cannot get into the Wound Clinic until 9/24, I will go ahead and order Home Health for nursing assistance with dressing changes.      Relevant Medications   cephALEXin (KEFLEX) 500 MG capsule   Other Relevant Orders   Ambulatory referral to Home Health     Genitourinary   Chronic kidney disease, stage 4 (severe) (HCC)    Renal labs show progression to Stage 4 CKD. I will refer to nephrology.      Relevant Orders   Ambulatory referral to Nephrology     Other   Edema of both lower legs    Likely  multifactorial. Vascular assessment pending. He should continue torsemide 20 mg two tabs bid.      Relevant Orders   Ambulatory referral to Home Health   Iron deficiency anemia    Remains anemic. This is likely multifactorial. I will renew his iron supplements.      Relevant Medications   ferrous sulfate 325 (65 FE) MG EC tablet    Return in about 4 weeks (around 05/12/2023) for Reassessment.   [name], MD

## 2023-04-20 ENCOUNTER — Encounter (HOSPITAL_BASED_OUTPATIENT_CLINIC_OR_DEPARTMENT_OTHER): Payer: Medicare HMO | Attending: Internal Medicine | Admitting: General Surgery

## 2023-04-20 DIAGNOSIS — I130 Hypertensive heart and chronic kidney disease with heart failure and stage 1 through stage 4 chronic kidney disease, or unspecified chronic kidney disease: Secondary | ICD-10-CM | POA: Diagnosis not present

## 2023-04-20 DIAGNOSIS — I872 Venous insufficiency (chronic) (peripheral): Secondary | ICD-10-CM | POA: Diagnosis not present

## 2023-04-20 DIAGNOSIS — L97812 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d: Secondary | ICD-10-CM | POA: Insufficient documentation

## 2023-04-20 DIAGNOSIS — E1142 Type 2 diabetes mellitus with diabetic polyneuropathy: Secondary | ICD-10-CM | POA: Diagnosis present

## 2023-04-20 DIAGNOSIS — I890 Lymphedema, not elsewhere classified: Secondary | ICD-10-CM | POA: Insufficient documentation

## 2023-04-20 DIAGNOSIS — D631 Anemia in chronic kidney disease: Secondary | ICD-10-CM | POA: Insufficient documentation

## 2023-04-20 DIAGNOSIS — E11622 Type 2 diabetes mellitus with other skin ulcer: Secondary | ICD-10-CM | POA: Insufficient documentation

## 2023-04-20 DIAGNOSIS — N184 Chronic kidney disease, stage 4 (severe): Secondary | ICD-10-CM | POA: Insufficient documentation

## 2023-04-20 DIAGNOSIS — L97822 Non-pressure chronic ulcer of other part of left lower leg with fat layer exposed: Secondary | ICD-10-CM | POA: Diagnosis not present

## 2023-04-20 DIAGNOSIS — I5022 Chronic systolic (congestive) heart failure: Secondary | ICD-10-CM | POA: Insufficient documentation

## 2023-04-21 DIAGNOSIS — I87312 Chronic venous hypertension (idiopathic) with ulcer of left lower extremity: Secondary | ICD-10-CM | POA: Diagnosis not present

## 2023-04-21 DIAGNOSIS — L97812 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d: Secondary | ICD-10-CM | POA: Diagnosis not present

## 2023-04-21 DIAGNOSIS — I872 Venous insufficiency (chronic) (peripheral): Secondary | ICD-10-CM | POA: Diagnosis not present

## 2023-04-21 NOTE — Progress Notes (Signed)
BRYANN, DEGENHARDT ([id number]) [id number] Page 1 of 11 Visit Report for 04/20/2023 Chief Complaint Document Details Patient Name: Date of Service: Peter Molt RD V. 04/20/2023 10:45 A M Medical Record Number: [id number] Patient Account Number: [id number] Date of Birth/Sex: Treating RN: 06-10-1956 (67 y.o. M) Primary Care Provider: Herbie Drape Other Clinician: Referring Provider: Treating Provider/Extender: Verita Schneiders in Treatment: 0 Information Obtained from: Patient Chief Complaint Patient presents for treatment of open ulcers due to venous insufficiency in the setting of severe lymphedema Electronic Signature(s) Signed: 04/20/2023 12:14:10 PM By: Duanne Guess MD FACS Entered By: Duanne Guess on 04/20/2023 12:14:10 -------------------------------------------------------------------------------- Debridement Details Patient Name: Date of Service: Peter Molt RD V. 04/20/2023 10:45 A M Medical Record Number: [id number] Patient Account Number: [id number] Date of Birth/Sex: Treating RN: Feb 10, 1956 (67 y.o. Dianna Limbo Primary Care Provider: Herbie Drape Other Clinician: Referring Provider: Treating Provider/Extender: Verita Schneiders in Treatment: 0 Debridement Performed for Assessment: Wound #1 Right,Lateral Lower Leg Performed By: Physician Duanne Guess, MD Debridement Type: Debridement Level of Consciousness (Pre-procedure): Awake and Alert Pre-procedure Verification/Time Out Yes - 11:52 Taken: Start Time: 11:52 Pain Control: Lidocaine 4% T opical Solution Percent of Wound Bed Debrided: 100% T Area Debrided (cm): otal 86.35 Tissue and other material debrided: Non-Viable, Slough, Slough Level: Non-Viable Tissue Debridement Description: Selective/Open Wound Instrument: Curette Bleeding: Minimum Hemostasis Achieved: Pressure End Time: 11:57 Procedural Pain: 0 Post Procedural Pain:  0 Response to Treatment: Procedure was tolerated well Level of Consciousness (Post- Awake and Alert procedure): Post Debridement Measurements of Total Wound Length: (cm) 10 Width: (cm) 11 Depth: (cm) 0.1 Volume: (cm) 8.639 Character of Wound/Ulcer Post Debridement: Improved Peter Terry, Peter Terry ([id number]) [id number] Page 2 of 11 Post Procedure Diagnosis Same as Pre-procedure Notes Scribed for Dr. Lady Gary by J.Scotton Electronic Signature(s) Signed: 04/20/2023 12:54:58 PM By: Duanne Guess MD FACS Signed: 04/20/2023 5:11:22 PM By: Karie Schwalbe RN Entered By: Karie Schwalbe on 04/20/2023 12:00:00 -------------------------------------------------------------------------------- Debridement Details Patient Name: Date of Service: Peter Molt RD V. 04/20/2023 10:45 A M Medical Record Number: [id number] Patient Account Number: [id number] Date of Birth/Sex: Treating RN: [birth year] (67 y.o. Dianna Limbo Primary Care Provider: Herbie Drape Other Clinician: Referring Provider: Treating Provider/Extender: Verita Schneiders in Treatment: 0 Debridement Performed for Assessment: Wound #2 Left,Lateral Lower Leg Performed By: Physician Duanne Guess, MD Debridement Type: Debridement Level of Consciousness (Pre-procedure): Awake and Alert Pre-procedure Verification/Time Out Yes - 11:52 Taken: Start Time: 11:52 Pain Control: Lidocaine 4% T opical Solution Percent of Wound Bed Debrided: 100% T Area Debrided (cm): otal 153.07 Tissue and other material debrided: Non-Viable, Slough, Slough Level: Non-Viable Tissue Debridement Description: Selective/Open Wound Instrument: Curette Bleeding: Minimum Hemostasis Achieved: Pressure End Time: 11:57 Procedural Pain: 0 Post Procedural Pain: 0 Response to Treatment: Procedure was tolerated well Level of Consciousness (Post- Awake and Alert procedure): Post Debridement Measurements of Total  Wound Length: (cm) 13 Width: (cm) 15 Depth: (cm) 0.1 Volume: (cm) 15.315 Character of Wound/Ulcer Post Debridement: Improved Post Procedure Diagnosis Same as Pre-procedure Notes Scribed for Dr. Lady Gary by J.Scotton Electronic Signature(s) Signed: 04/20/2023 12:54:58 PM By: Duanne Guess MD FACS Signed: 04/20/2023 5:11:22 PM By: Karie Schwalbe RN Entered By: Karie Schwalbe on 04/20/2023 12:01:17 Peter Terry, Peter Terry ([id number]) [id number] Page 3 of 11 -------------------------------------------------------------------------------- HPI Details Patient Name: Date of Service: Peter Molt RD V. 04/20/2023 10:45 A M Medical Record Number: [id number] Patient Account Number: [id number] Date of Birth/Sex: Treating RN: 23-Apr-1956 (67 y.o. M)  Primary Care Provider: Herbie Drape Other Clinician: Referring Provider: Treating Provider/Extender: Verita Schneiders in Treatment: 0 History of Present Illness HPI Description: ADMISSION 04/20/2023 ***ABIs L:0.85; R:0.96*** This is a 67 year old type II diabetic (last hemoglobin A1c 7.6%) with congestive heart failure, at least stage II lymphedema, peripheral neuropathy, CKD stage IV, and morbid obesity with a BMI of 49.12. He does not wear compression stockings as he is unable to reach his feet. Occasionally his sister helps him and applies some kind of compression wrap to his lower legs. He was referred to the wound care center by his primary care provider for further evaluation and management of his lymphedema skin changes that have resulted in tissue breakdown. Electronic Signature(s) Signed: 04/20/2023 12:19:29 PM By: Duanne Guess MD FACS Entered By: Duanne Guess on 04/20/2023 12:19:29 -------------------------------------------------------------------------------- Physical Exam Details Patient Name: Date of Service: Peter Molt RD V. 04/20/2023 10:45 A M Medical Record Number:  [id number] Patient Account Number: [id number] Date of Birth/Sex: Treating RN: [birth year] (67 y.o. M) Primary Care Provider: Herbie Drape Other Clinician: Referring Provider: Treating Provider/Extender: Verita Schneiders in Treatment: 0 Constitutional Hypertensive, asymptomatic. . . . No acute distress. Respiratory Normal work of breathing on room air. Cardiovascular 3-4+ edema with skin changes consistent with severe stage II-III lymphedema. Notes 04/20/2023: On the lateral aspect of the bilateral lower legs, he has ulceration typical for lymphedema. There is slough accumulation on all of the wound surfaces. There is no malodor or purulent drainage. Electronic Signature(s) Signed: 04/20/2023 12:20:52 PM By: Duanne Guess MD FACS Entered By: Duanne Guess on 04/20/2023 12:20:52 -------------------------------------------------------------------------------- Physician Orders Details Patient Name: Date of Service: Peter Molt RD V. 04/20/2023 10:45 A Judeth Cornfield, Cato Mulligan ([id number]) [id number] Page 4 of 11 Medical Record Number: [id number] Patient Account Number: [id number] Date of Birth/Sex: Treating RN: 15-Apr-1956 (67 y.o. Dianna Limbo Primary Care Provider: Herbie Drape Other Clinician: Referring Provider: Treating Provider/Extender: Verita Schneiders in Treatment: 0 Verbal / Phone Orders: No Diagnosis Coding ICD-10 Coding Code Description [phone] 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d L97.822 Non-pressure chronic ulcer of other part of left lower leg with fat layer exposed E11.622 Type 2 diabetes mellitus with other skin ulcer I89.0 Lymphedema, not elsewhere classified N18.4 Chronic kidney disease, stage 4 (severe) I50.22 Chronic systolic (congestive) heart failure E66.01 Morbid (severe) obesity due to excess calories Follow-up Appointments ppointment in 1  week. - Dr. Lady Gary Room 3 Return A Anesthetic (In clinic) Topical Lidocaine 4% applied to wound bed Bathing/ Shower/ Hygiene May shower with protection but do not get wound dressing(s) wet. Protect dressing(s) with water repellant cover (for example, large plastic bag) or a cast cover and may then take shower. - Use cast protectors to keep the leg wraps wet when taking a shower or bathing. Keep leg wraps DRY! Edema Control - Lymphedema / SCD / Other Bilateral Lower Extremities Elevate legs to the level of the heart or above for 30 minutes daily and/or when sitting for 3-4 times a day throughout the day. Avoid standing for long periods of time. Other Edema Control Orders/Instructions: - will order Farrow compression garments Wound Treatment Wound #1 - Lower Leg Wound Laterality: Right, Lateral Cleanser: Soap and Water 1 x Per Week/30 Days Discharge Instructions: May shower and wash wound with dial antibacterial soap and water prior to dressing change. Cleanser: Vashe 5.8 (oz) 1 x Per Week/30 Days Discharge Instructions: Cleanse the wound with  Vashe prior to applying a clean dressing using gauze sponges, not tissue or cotton balls. Cleanser: Wound Cleanser 1 x Per Week/30 Days Discharge Instructions: Cleanse the wound with wound cleanser prior to applying a clean dressing using gauze sponges, not tissue or cotton balls. Peri-Wound Care: Sween Lotion (Moisturizing lotion) 1 x Per Week/30 Days Discharge Instructions: Apply moisturizing lotion as directed Prim Dressing: Maxorb Extra Ag+ Alginate Dressing, 4x4.75 (in/in) 1 x Per Week/30 Days ary Discharge Instructions: Apply to wound bed as instructed Secondary Dressing: ABD Pad, 8x10 1 x Per Week/30 Days Discharge Instructions: Apply over primary dressing as directed. Compression Wrap: Urgo K2, (equivalent to a 4 layer) two layer compression system, regular 1 x Per Week/30 Days Discharge Instructions: Apply Urgo K2 as directed (alternative to  4 layer compression). Compression Wrap: Stockinette 1 x Per Week/30 Days Discharge Instructions: over leg wraps Compression Stockings: Jobst Farrow Wrap 4000 (DME) Right Leg Compression Amount: 30-40 mmHG Discharge Instructions: Apply Renee Pain daily as instructed. Apply first thing in the morning, remove at night before bed. Wound #2 - Lower Leg Wound Laterality: Left, Lateral Cleanser: Soap and Water 1 x Per Week/30 Days Discharge Instructions: May shower and wash wound with dial antibacterial soap and water prior to dressing change. Cleanser: Vashe 5.8 (oz) 1 x Per Week/30 Days Discharge Instructions: Cleanse the wound with Vashe prior to applying a clean dressing using gauze sponges, not tissue or cotton balls. Cleanser: Wound Cleanser 1 x Per Week/30 Days Discharge Instructions: Cleanse the wound with wound cleanser prior to applying a clean dressing using gauze sponges, not tissue or cotton balls. Peter Terry, Peter Terry ([id number]) [id number] Page 5 of 11 Peri-Wound Care: Sween Lotion (Moisturizing lotion) 1 x Per Week/30 Days Discharge Instructions: Apply moisturizing lotion as directed Prim Dressing: Maxorb Extra Ag+ Alginate Dressing, 4x4.75 (in/in) 1 x Per Week/30 Days ary Discharge Instructions: Apply to wound bed as instructed Secondary Dressing: ABD Pad, 8x10 1 x Per Week/30 Days Discharge Instructions: Apply over primary dressing as directed. Compression Wrap: Urgo K2, (equivalent to a 4 layer) two layer compression system, regular 1 x Per Week/30 Days Discharge Instructions: Apply Urgo K2 as directed (alternative to 4 layer compression). Compression Wrap: Stockinette 1 x Per Week/30 Days Discharge Instructions: over leg wraps Compression Stockings: Jobst Farrow Wrap 4000 (DME) Left Leg Compression Amount: 30-40 mmHG Discharge Instructions: Apply Renee Pain daily as instructed. Apply first thing in the morning, remove at night before bed. Electronic  Signature(s) Signed: 04/20/2023 12:54:58 PM By: Duanne Guess MD FACS Entered By: Duanne Guess on 04/20/2023 12:26:10 -------------------------------------------------------------------------------- Problem List Details Patient Name: Date of Service: Peter Molt RD V. 04/20/2023 10:45 A M Medical Record Number: [id number] Patient Account Number: [id number] Date of Birth/Sex: Treating RN: 1956-05-12 (67 y.o. M) Primary Care Provider: Herbie Drape Other Clinician: Referring Provider: Treating Provider/Extender: Verita Schneiders in Treatment: 0 Active Problems ICD-10 Encounter Code Description Active Date MDM Diagnosis [phone] Non-pressure chronic ulcer of other part of right lower leg with fat layer 04/20/2023 No Yes exposed L97.822 Non-pressure chronic ulcer of other part of left lower leg with fat layer exposed9/05/2023 No Yes I89.0 Lymphedema, not elsewhere classified 04/20/2023 No Yes E11.622 Type 2 diabetes mellitus with other skin ulcer 04/20/2023 No Yes I50.22 Chronic systolic (congestive) heart failure 04/20/2023 No Yes N18.4 Chronic kidney disease, stage 4 (severe) 04/20/2023 No Yes E66.01 Morbid (severe) obesity due to excess calories 04/20/2023 No Yes Peter Terry, Peter Terry ([id number]) [id number] Page 6 of 11 Inactive Problems Resolved  Problems Electronic Signature(s) Signed: 04/20/2023 12:13:28 PM By: Duanne Guess MD FACS Previous Signature: 04/20/2023 11:34:59 AM Version By: Duanne Guess MD FACS Entered By: Duanne Guess on 04/20/2023 12:13:28 -------------------------------------------------------------------------------- Progress Note Details Patient Name: Date of Service: Peter Molt RD V. 04/20/2023 10:45 A M Medical Record Number: [id number] Patient Account Number: [id number] Date of Birth/Sex: Treating RN: [birth year] (67 y.o. M) Primary Care Provider: Herbie Drape Other Clinician: Referring  Provider: Treating Provider/Extender: Verita Schneiders in Treatment: 0 Subjective Chief Complaint Information obtained from Patient Patient presents for treatment of open ulcers due to venous insufficiency in the setting of severe lymphedema History of Present Illness (HPI) ADMISSION 04/20/2023 ***ABIs L:0.85; R:0.96*** This is a 67 year old type II diabetic (last hemoglobin A1c 7.6%) with congestive heart failure, at least stage II lymphedema, peripheral neuropathy, CKD stage IV, and morbid obesity with a BMI of 49.12. He does not wear compression stockings as he is unable to reach his feet. Occasionally his sister helps him and applies some kind of compression wrap to his lower legs. He was referred to the wound care center by his primary care provider for further evaluation and management of his lymphedema skin changes that have resulted in tissue breakdown. Patient History Information obtained from Patient, Chart. Allergies No Known Allergies Family History Unknown History. Social History Former smoker - ended on 08/11/2015, Marital Status - Single, Alcohol Use - Never, Drug Use - No History, Caffeine Use - Daily. Medical History Hematologic/Lymphatic Patient has history of Anemia - iron deficiency Cardiovascular Patient has history of Congestive Heart Failure, Hypertension, 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 Endocrine Patient has history of Type II Diabetes Neurologic Patient has history of Neuropathy Patient is treated with Insulin, Oral Agents. Blood sugar results noted at the following times: Breakfast - 234. Hospitalization/Surgery History - Appendectomy. - Lumbar disc surgery. Medical A Surgical History Notes nd Constitutional Symptoms (General Health) obese Eyes retinal tear Gastrointestinal GERD Genitourinary CKD stage 4 Review of Systems (ROS) Ear/Nose/Mouth/Throat Peter Terry, Peter Terry ([id number])  [id number] Page 7 of 11 Denies complaints or symptoms of Chronic sinus problems or rhinitis. Respiratory Denies complaints or symptoms of Chronic or frequent coughs, Shortness of Breath. Integumentary (Skin) Complains or has symptoms of Wounds. Musculoskeletal Denies complaints or symptoms of Muscle Pain, Muscle Weakness. Psychiatric Denies complaints or symptoms of Claustrophobia. Objective Constitutional Hypertensive, asymptomatic. No acute distress. Vitals Time Taken: 10:59 AM, Height: 69 in, Weight: 310 lbs, BMI: 45.8, Temperature: 97.8 F, Pulse: 81 bpm, Respiratory Rate: 18 breaths/min, Blood Pressure: 162/85 mmHg. Respiratory Normal work of breathing on room air. Cardiovascular 3-4+ edema with skin changes consistent with severe stage II-III lymphedema. General Notes: 04/20/2023: On the lateral aspect of the bilateral lower legs, he has ulceration typical for lymphedema. There is slough accumulation on all of the wound surfaces. There is no malodor or purulent drainage. Integumentary (Hair, Skin) Wound #1 status is Open. Original cause of wound was Gradually Appeared. The date acquired was: 04/16/2022. The wound is located on the Right,Lateral Lower Leg. The wound measures 10cm length x 11cm width x 0.1cm depth; 86.394cm^2 area and 8.639cm^3 volume. There is Fat Layer (Subcutaneous Tissue) exposed. There is no tunneling or undermining noted. There is a medium amount of serosanguineous drainage noted. There is small (1-33%) pink granulation within the wound bed. There is a large (67-100%) amount of necrotic tissue within the wound bed including Eschar and Adherent Slough. The periwound skin appearance had no abnormalities noted for moisture. The periwound skin appearance  exhibited: Scarring, Hemosiderin Staining. Periwound temperature was noted as No Abnormality. Wound #2 status is Open. Original cause of wound was Gradually Appeared. The date acquired was:  04/16/2022. The wound is located on the Left,Lateral Lower Leg. The wound measures 13cm length x 15cm width x 0.1cm depth; 153.153cm^2 area and 15.315cm^3 volume. There is Fat Layer (Subcutaneous Tissue) exposed. There is no tunneling or undermining noted. There is a medium amount of serosanguineous drainage noted. The wound margin is thickened. There is small (1-33%) pink granulation within the wound bed. There is a large (67-100%) amount of necrotic tissue within the wound bed including Eschar and Adherent Slough. The periwound skin appearance had no abnormalities noted for moisture. The periwound skin appearance exhibited: Scarring, Hemosiderin Staining. Periwound temperature was noted as No Abnormality. Assessment Active Problems ICD-10 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d Non-pressure chronic ulcer of other part of left lower leg with fat layer exposed Lymphedema, not elsewhere classified Type 2 diabetes mellitus with other skin ulcer Chronic systolic (congestive) heart failure Chronic kidney disease, stage 4 (severe) Morbid (severe) obesity due to excess calories Procedures Wound #1 Pre-procedure diagnosis of Wound #1 is a Lymphedema located on the Right,Lateral Lower Leg . There was a Selective/Open Wound Non-Viable Tissue Debridement with a total area of 86.35 sq cm performed by Duanne Guess, MD. With the following instrument(s): Curette to remove Non-Viable tissue/material. Material removed includes [facility] after achieving pain control using Lidocaine 4% Topical Solution. No specimens were taken. A time out was conducted at 11:52, prior to the start of the procedure. A Minimum amount of bleeding was controlled with Pressure. The procedure was tolerated well with a pain level of 0 throughout and a pain level of 0 following the procedure. Post Debridement Measurements: 10cm length x 11cm width x 0.1cm depth; 8.639cm^3 volume. Character of  Wound/Ulcer Post Debridement is improved. Post procedure Diagnosis Wound #1: Same as Pre-Procedure General Notes: Scribed for Dr. Lady Gary by J.Scotton. Pre-procedure diagnosis of Wound #1 is a Lymphedema located on the Right,Lateral Lower Leg . There was a Four Layer Compression Therapy Procedure by Karie Schwalbe, RN. Peter Terry, Peter Terry ([id number]) [id number] Page 8 of 11 Post procedure Diagnosis Wound #1: Same as Pre-Procedure Notes: or Urgo K2. Wound #2 Pre-procedure diagnosis of Wound #2 is a Lymphedema located on the Left,Lateral Lower Leg . There was a Selective/Open Wound Non-Viable Tissue Debridement with a total area of 153.07 sq cm performed by Duanne Guess, MD. With the following instrument(s): Curette to remove Non-Viable tissue/material. Material removed includes [facility] after achieving pain control using Lidocaine 4% Topical Solution. No specimens were taken. A time out was conducted at 11:52, prior to the start of the procedure. A Minimum amount of bleeding was controlled with Pressure. The procedure was tolerated well with a pain level of 0 throughout and a pain level of 0 following the procedure. Post Debridement Measurements: 13cm length x 15cm width x 0.1cm depth; 15.315cm^3 volume. Character of Wound/Ulcer Post Debridement is improved. Post procedure Diagnosis Wound #2: Same as Pre-Procedure General Notes: Scribed for Dr. Lady Gary by J.Scotton. Pre-procedure diagnosis of Wound #2 is a Lymphedema located on the Left,Lateral Lower Leg . There was a Four Layer Compression Therapy Procedure by Karie Schwalbe, RN. Post procedure Diagnosis Wound #2: Same as Pre-Procedure Notes: or Urgo K2. Plan Follow-up Appointments: Return Appointment in 1 week. - Dr. Lady Gary Room 3 Anesthetic: (In clinic) Topical Lidocaine 4% applied to wound bed Bathing/ Shower/ Hygiene:  May shower with protection but do not get wound dressing(s) wet. Protect dressing(s) with  water repellant cover (for example, large plastic bag) or a cast cover and may then take shower. - Use cast protectors to keep the leg wraps wet when taking a shower or bathing. Keep leg wraps DRY! Edema Control - Lymphedema / SCD / Other: Elevate legs to the level of the heart or above for 30 minutes daily and/or when sitting for 3-4 times a day throughout the day. Avoid standing for long periods of time. Other Edema Control Orders/Instructions: - will order Farrow compression garments WOUND #1: - Lower Leg Wound Laterality: Right, Lateral Cleanser: Soap and Water 1 x Per Week/30 Days Discharge Instructions: May shower and wash wound with dial antibacterial soap and water prior to dressing change. Cleanser: Vashe 5.8 (oz) 1 x Per Week/30 Days Discharge Instructions: Cleanse the wound with Vashe prior to applying a clean dressing using gauze sponges, not tissue or cotton balls. Cleanser: Wound Cleanser 1 x Per Week/30 Days Discharge Instructions: Cleanse the wound with wound cleanser prior to applying a clean dressing using gauze sponges, not tissue or cotton balls. Peri-Wound Care: Sween Lotion (Moisturizing lotion) 1 x Per Week/30 Days Discharge Instructions: Apply moisturizing lotion as directed Prim Dressing: Maxorb Extra Ag+ Alginate Dressing, 4x4.75 (in/in) 1 x Per Week/30 Days ary Discharge Instructions: Apply to wound bed as instructed Secondary Dressing: ABD Pad, 8x10 1 x Per Week/30 Days Discharge Instructions: Apply over primary dressing as directed. Com pression Wrap: Urgo K2, (equivalent to a 4 layer) two layer compression system, regular 1 x Per Week/30 Days Discharge Instructions: Apply Urgo K2 as directed (alternative to 4 layer compression). Com pression Wrap: Stockinette 1 x Per Week/30 Days Discharge Instructions: over leg wraps Com pression Stockings: Jobst Farrow Wrap 4000 (DME) Compression Amount: 30-40 mmHg (right) Discharge Instructions: Apply Renee Pain daily as  instructed. Apply first thing in the morning, remove at night before bed. WOUND #2: - Lower Leg Wound Laterality: Left, Lateral Cleanser: Soap and Water 1 x Per Week/30 Days Discharge Instructions: May shower and wash wound with dial antibacterial soap and water prior to dressing change. Cleanser: Vashe 5.8 (oz) 1 x Per Week/30 Days Discharge Instructions: Cleanse the wound with Vashe prior to applying a clean dressing using gauze sponges, not tissue or cotton balls. Cleanser: Wound Cleanser 1 x Per Week/30 Days Discharge Instructions: Cleanse the wound with wound cleanser prior to applying a clean dressing using gauze sponges, not tissue or cotton balls. Peri-Wound Care: Sween Lotion (Moisturizing lotion) 1 x Per Week/30 Days Discharge Instructions: Apply moisturizing lotion as directed Prim Dressing: Maxorb Extra Ag+ Alginate Dressing, 4x4.75 (in/in) 1 x Per Week/30 Days ary Discharge Instructions: Apply to wound bed as instructed Secondary Dressing: ABD Pad, 8x10 1 x Per Week/30 Days Discharge Instructions: Apply over primary dressing as directed. Com pression Wrap: Urgo K2, (equivalent to a 4 layer) two layer compression system, regular 1 x Per Week/30 Days Discharge Instructions: Apply Urgo K2 as directed (alternative to 4 layer compression). Com pression Wrap: Stockinette 1 x Per Week/30 Days Discharge Instructions: over leg wraps Com pression Stockings: Jobst Farrow Wrap 4000 (DME) Compression Amount: 30-40 mmHg (left) Discharge Instructions: Apply Renee Pain daily as instructed. Apply first thing in the morning, remove at night before bed. 04/20/2023: This is a 67 year old man with severe lymphedema. He presents to clinic today with lower extremity wounds. On the lateral aspect of the bilateral lower legs, he has ulceration typical for lymphedema.  There is slough accumulation on all of the wound surfaces. There is no malodor or purulent drainage. I used a curette to debride the slough  from the open wound surfaces. We will apply silver alginate and Urgo 4-layer compression equivalent wraps. He definitely needs long-term compression garments, but is unable to reach his feet; perhaps he will be able to use Farrow wraps with some assistive device. He will also probably benefit from lymphedema pumps but has not been in compression therapy for 30 days. I discussed the importance of keeping his legs elevated is much as possible during the day and at night while he is asleep. He will follow-up in 1 week. Peter Terry, Peter Terry ([id number]) [id number] Page 9 of 11 Electronic Signature(s) Signed: 04/20/2023 12:28:11 PM By: Duanne Guess MD FACS Entered By: Duanne Guess on 04/20/2023 12:28:11 -------------------------------------------------------------------------------- HxROS Details Patient Name: Date of Service: Peter Molt RD V. 04/20/2023 10:45 A M Medical Record Number: [id number] Patient Account Number: [id number] Date of Birth/Sex: Treating RN: [birth year] (67 y.o. Peter Terry Primary Care Provider: Herbie Drape Other Clinician: Referring Provider: Treating Provider/Extender: Verita Schneiders in Treatment: 0 Information Obtained From Patient Chart Ear/Nose/Mouth/Throat Complaints and Symptoms: Negative for: Chronic sinus problems or rhinitis Respiratory Complaints and Symptoms: Negative for: Chronic or frequent coughs; Shortness of Breath Integumentary (Skin) Complaints and Symptoms: Positive for: Wounds Musculoskeletal Complaints and Symptoms: Negative for: Muscle Pain; Muscle Weakness Psychiatric Complaints and Symptoms: Negative for: Claustrophobia Constitutional Symptoms (General Health) Medical History: Past Medical History Notes: obese Eyes Medical History: Past Medical History Notes: retinal tear Hematologic/Lymphatic Medical History: Positive for: Anemia - iron  deficiency Cardiovascular Medical History: Positive for: Congestive Heart Failure; Hypertension; 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 Gastrointestinal Medical History: Past Medical History Notes: GERD Peter Terry, Peter Terry ([id number]) [id number] Page 10 of 11 Endocrine Medical History: Positive for: Type II Diabetes Time with diabetes: 10 years Treated with: Insulin, Oral agents Blood sugar testing results: Breakfast: 234 Genitourinary Medical History: Past Medical History Notes: CKD stage 4 Immunological Neurologic Medical History: Positive for: Neuropathy Oncologic Immunizations Pneumococcal Vaccine: Received Pneumococcal Vaccination: No Implantable Devices None Hospitalization / Surgery History Type of Hospitalization/Surgery Appendectomy Lumbar disc surgery Family and Social History Unknown History: Yes; Former smoker - ended on 08/11/2015; Marital Status - Single; Alcohol Use: Never; Drug Use: No History; Caffeine Use: Daily; Financial Concerns: No; Food, Clothing or Shelter Needs: No; Support System Lacking: No; Transportation Concerns: No Psychologist, prison and probation services) Signed: 04/20/2023 12:54:58 PM By: Duanne Guess MD FACS Signed: 04/20/2023 3:31:30 PM By: Samuella Bruin Signed: 04/20/2023 5:11:22 PM By: Karie Schwalbe RN Entered By: Karie Schwalbe on 04/20/2023 11:25:48 -------------------------------------------------------------------------------- SuperBill Details Patient Name: Date of Service: Peter Molt RD V. 04/20/2023 Medical Record Number: [id number] Patient Account Number: [id number] Date of Birth/Sex: Treating RN: 06-21-1956 (67 y.o. M) Primary Care Provider: Herbie Drape Other Clinician: Referring Provider: Treating Provider/Extender: Verita Schneiders in Treatment: 0 Diagnosis Coding ICD-10 Codes Code Description [phone] Non-pressure chronic ulcer of other part of right lower leg with  fat layer exposed L97.822 Non-pressure chronic ulcer of other part of left lower leg with fat layer exposed I89.0 Lymphedema, not elsewhere classified E11.622 Type 2 diabetes mellitus with other skin ulcer I50.22 Chronic systolic (congestive) heart failure Peter Terry, Peter Terry ([id number]) [id number] Page 11 of 11 N18.4 Chronic kidney disease, stage 4 (severe) E66.01 Morbid (severe) obesity due to excess calories Facility Procedures : CPT4 Code: [id number] Description: 99213 - WOUND CARE VISIT-LEV 3 EST PT Modifier:  Quantity: 1 : CPT4 Code: 69629528 Description: 97597 - DEBRIDE WOUND 1ST 20 SQ CM OR < ICD-10 Diagnosis Description L97.812 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 L97.822 Non-pressure chronic ulcer of other part of left lower leg with fat layer  exposed Modifier: d Quantity: 1 : CPT4 Code: 41324401 Description: 97598 - DEBRIDE WOUND EA ADDL 20 SQ CM ICD-10 Diagnosis Description L97.812 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 L97.822 Non-pressure chronic ulcer of other part of left lower leg with fat layer  exposed Modifier: d Quantity: 11 Physician Procedures : CPT4 Code Description Modifier [id number] 99204 - WC PHYS LEVEL 4 - NEW PT 25 ICD-10 Diagnosis Description L97.812 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d L97.822 Non-pressure chronic ulcer of other part of left  lower leg with fat layer exposed I89.0 Lymphedema, not elsewhere classified E11.622 Type 2 diabetes mellitus with other skin ulcer Quantity: 1 : [id number] 97597 - WC PHYS DEBR WO ANESTH 20 SQ CM ICD-10 Diagnosis Description L97.812 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d L97.822 Non-pressure chronic ulcer of other part of left lower leg with fat layer  exposed Quantity: 1 : [id number] 97598 - WC PHYS DEBR WO ANESTH EA ADD 20  CM ICD-10 Diagnosis Description L97.812 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d L97.822 Non-pressure chronic ulcer of other part of left lower leg with fat  layer exposed Quantity: 11 Electronic Signature(s) Signed: 04/20/2023 5:11:22 PM By: Karie Schwalbe RN Signed: 04/21/2023 7:52:58 AM By: Duanne Guess MD FACS Previous Signature: 04/20/2023 12:28:44 PM Version By: Duanne Guess MD FACS Entered By: Karie Schwalbe on 04/20/2023 16:29:44

## 2023-04-21 NOTE — Progress Notes (Signed)
Peter, Terry ([id number]) [id number] Page 1 of 4 Visit Report for 04/20/2023 Abuse Risk Screen Details Patient Name: Date of Service: Peter Terry RD Terry. 04/20/2023 10:45 A M Medical Record Number: [id number] Patient Account Number: [id number] Date of Birth/Sex: Treating RN: [birth year] (67 y.o. Peter Terry Primary Care Peter Terry: Peter Terry Other Clinician: Referring Peter Terry: Treating Peter Terry/Extender: Peter Terry in Treatment: 0 Abuse Risk Screen Items Answer ABUSE RISK SCREEN: Has anyone close to you tried to hurt or harm you recentlyo No Do you feel uncomfortable with anyone in your familyo No Has anyone forced you do things that you didnt want to doo No Electronic Signature(s) Signed: 04/20/2023 5:11:22 PM By: Peter Schwalbe RN Entered By: Peter Terry on 04/20/2023 13:23:35 -------------------------------------------------------------------------------- Activities of Daily Living Details Patient Name: Date of Service: Peter Terry RD Terry. 04/20/2023 10:45 A M Medical Record Number: [id number] Patient Account Number: [id number] Date of Birth/Sex: Treating RN: 1956-06-12 (67 y.o. Peter Terry Primary Care Peter Terry: Peter Terry Other Clinician: Referring Peter Terry: Treating Peter Terry/Extender: Peter Terry in Treatment: 0 Activities of Daily Living Items Answer Activities of Daily Living (Please select one for each item) Drive Automobile Need Assistance T Medications ake Completely Able Use T elephone Completely Able Care for Appearance Completely Able Use T oilet Completely Able Bath / Shower Completely Able Dress Self Completely Able Feed Self Completely Able Walk Completely Able Get In / Out Bed Completely Able Housework Completely Able Prepare Meals Need Assistance Handle Money Completely Able Shop for Self Completely Able Electronic Signature(s) Signed: 04/20/2023  5:11:22 PM By: Peter Schwalbe RN Entered By: Peter Terry on 04/20/2023 13:24:10 Peter Terry ([id number]) [phone] [id number] Page 2 of 4 -------------------------------------------------------------------------------- Education Screening Details Patient Name: Date of Service: Peter Terry RD Terry. 04/20/2023 10:45 A M Medical Record Number: [id number] Patient Account Number: [id number] Date of Birth/Sex: Treating RN: 1955-11-20 (67 y.o. Peter Terry Primary Care Peter Terry: Peter Terry Other Clinician: Referring Peter Terry: Treating Peter Terry/Extender: Peter Terry in Treatment: 0 Primary Learner Assessed: Patient Learning Preferences/Education Level/Primary Language Learning Preference: Explanation, Demonstration, Printed Material Highest Education Level: High School Preferred Language: English Cognitive Barrier Language Barrier: No Translator Needed: No Memory Deficit: No Emotional Barrier: No Cultural/Religious Beliefs Affecting Medical Care: No Physical Barrier Impaired Vision: No Impaired Hearing: No Decreased Hand dexterity: No Knowledge/Comprehension Knowledge Level: High Comprehension Level: High Ability to understand written instructions: High Ability to understand verbal instructions: High Motivation Anxiety Level: Calm Cooperation: Cooperative Education Importance: Acknowledges Need Interest in Health Problems: Asks Questions Perception: Coherent Willingness to Engage in Self-Management High Activities: Readiness to Engage in Self-Management High Activities: Electronic Signature(s) Signed: 04/20/2023 5:11:22 PM By: Peter Schwalbe RN Entered By: Peter Terry on 04/20/2023 13:24:38 -------------------------------------------------------------------------------- Fall Risk Assessment Details Patient Name: Date of Service: Peter Terry RD Terry. 04/20/2023 10:45 A M Medical Record Number: [id number] Patient  Account Number: [id number] Date of Birth/Sex: Treating RN: [birth year] (67 y.o. Peter Terry Primary Care Peter Terry: Peter Terry Other Clinician: Referring Peter Terry: Treating Peter Terry/Extender: Peter Terry in Treatment: 0 Fall Risk Assessment Items Have you had 2 or more falls in the last [address] monthso 0 No Peter Terry, [name] [phone] [id number] Page 3 of 4 Have you had any fall that resulted in injury in the last 12 monthso 0 No FALLS RISK SCREEN History of falling - immediate or within 3 months 0 No Secondary diagnosis (Do you have 2 or more medical diagnoseso)  0 No Ambulatory aid None/bed rest/wheelchair/nurse 0 No Crutches/cane/walker 0 No Furniture 0 No Intravenous therapy Access/Saline/Heparin Lock 0 No Gait/Transferring Normal/ bed rest/ wheelchair 0 No Weak (short steps with or without shuffle, stooped but able to lift head while walking, may seek 0 No support from furniture) Impaired (short steps with shuffle, may have difficulty arising from chair, head down, impaired 0 No balance) Mental Status Oriented to own ability 0 No Electronic Signature(s) Signed: 04/20/2023 5:11:22 PM By: Peter Schwalbe RN Entered By: Peter Terry on 04/20/2023 13:24:47 -------------------------------------------------------------------------------- Foot Assessment Details Patient Name: Date of Service: Peter Terry RD Terry. 04/20/2023 10:45 A M Medical Record Number: [id number] Patient Account Number: [id number] Date of Birth/Sex: Treating RN: 17-Oct-1955 (67 y.o. Peter Terry Primary Care Starlene Consuegra: Peter Terry Other Clinician: Referring Peter Terry: Treating Peter Terry/Extender: Peter Terry in Treatment: 0 Foot Assessment Items Site Locations + = Sensation present, - = Sensation absent, C = Callus, U = Ulcer R = Redness, W = Warmth, M = Maceration, PU = Pre-ulcerative lesion F = Fissure, S =  Swelling, D = Dryness Assessment Right: Left: Other Deformity: No No Prior Foot Ulcer: No No Prior Amputation: No No Charcot Joint: No No Ambulatory Status: Ambulatory Without Help GaitMAHDI, [name] [phone] [id number] Page 4 of 4 Electronic Signature(s) Signed: 04/20/2023 5:11:22 PM By: Peter Schwalbe RN Entered By: Peter Terry on 04/20/2023 13:25:40 -------------------------------------------------------------------------------- Nutrition Risk Screening Details Patient Name: Date of Service: Peter Terry RD Terry. 04/20/2023 10:45 A M Medical Record Number: [id number] Patient Account Number: [id number] Date of Birth/Sex: Treating RN: Jul 21, 1956 (67 y.o. Peter Terry Primary Care Jamille Fisher: Peter Terry Other Clinician: Referring Royale Swamy: Treating Alondria Mousseau/Extender: Peter Terry in Treatment: 0 Height (in): 69 Weight (lbs): 310 Body Mass Index (BMI): 45.8 Nutrition Risk Screening Items Score Screening NUTRITION RISK SCREEN: I have an illness or condition that made me change the kind and/or amount of food I eat 0 No I eat fewer than two meals per day 0 No I eat few fruits and vegetables, or milk products 0 No I have three or more drinks of beer, liquor or wine almost every day 0 No I have tooth or mouth problems that make it hard for me to eat 0 No I don't always have enough money to buy the food I need 0 No I eat alone most of the time 0 No I take three or more different prescribed or over-the-counter drugs a day 0 No Without wanting to, I have lost or gained 10 pounds in the last six months 0 No I am not always physically able to shop, cook and/or feed myself 0 No Nutrition Protocols Good Risk Protocol 0 No interventions needed Moderate Risk Protocol High Risk Proctocol Risk Level: Good Risk Score: 0 Electronic Signature(s) Signed: 04/20/2023 5:11:22 PM By: Peter Schwalbe RN Entered By:  Peter Terry on 04/20/2023 13:24:53

## 2023-04-21 NOTE — Progress Notes (Signed)
ANDREWJOHN, TOLLER ([id number]) [id number] Page 1 of 11 Visit Report for 04/20/2023 Allergy List Details Patient Name: Date of Service: Peter Terry RD V. 04/20/2023 10:45 A M Medical Record Number: [id number] Patient Account Number: [id number] Date of Birth/Sex: Treating RN: 08-25-1955 (67 y.o. Marlan Palau Primary Care Marleen Moret: Herbie Drape Other Clinician: Referring Dellanira Dillow: Treating Peter Terry/Extender: Verita Schneiders in Treatment: 0 Allergies Active Allergies No Known Allergies Allergy Notes Electronic Signature(s) Signed: 04/20/2023 5:11:22 PM By: Karie Schwalbe RN Entered By: Karie Schwalbe on 04/20/2023 08:24:00 -------------------------------------------------------------------------------- Arrival Information Details Patient Name: Date of Service: Peter Terry RD V. 04/20/2023 10:45 A M Medical Record Number: [id number] Patient Account Number: [id number] Date of Birth/Sex: Treating RN: Sep 08, 1955 (67 y.o. M) Primary Care Darnette Lampron: Herbie Drape Other Clinician: Referring Naileah Karg: Treating Peter Terry/Extender: Verita Schneiders in Treatment: 0 Visit Information Patient Arrived: Ambulatory Arrival Time: 10:56 Accompanied By: Self Transfer Assistance: None Patient Identification Verified: Yes Secondary Verification Process Completed: Yes Electronic Signature(s) Signed: 04/20/2023 5:11:22 PM By: Karie Schwalbe RN Previous Signature: 04/20/2023 11:07:25 AM Version By: [place] Scrape Entered By: Karie Schwalbe on 04/20/2023 08:23:04 -------------------------------------------------------------------------------- Clinic Level of Care Assessment Details Patient Name: Date of Service: Peter Terry RD V. 04/20/2023 10:45 A M Medical Record Number: [id number] Patient Account Number: [id number] Date of Birth/Sex: Treating RN: [birth year] (67 y.o. Kohlman, Bianca, Ola V ([id number])  [id number] Page 2 of 11 Primary Care Ulla Mckiernan: Herbie Drape Other Clinician: Referring Tarynn Garling: Treating Peter Terry/Extender: Verita Schneiders in Treatment: 0 Clinic Level of Care Assessment Items TOOL 1 Quantity Score X- 1 0 Use when EandM and Procedure is performed on INITIAL visit ASSESSMENTS - Nursing Assessment / Reassessment X- 1 20 General Physical Exam (combine w/ comprehensive assessment (listed just below) when performed on new pt. evals) X- 1 25 Comprehensive Assessment (HX, ROS, Risk Assessments, Wounds Hx, etc.) ASSESSMENTS - Wound and Skin Assessment / Reassessment []  - 0 Dermatologic / Skin Assessment (not related to wound area) ASSESSMENTS - Ostomy and/or Continence Assessment and Care []  - 0 Incontinence Assessment and Management []  - 0 Ostomy Care Assessment and Management (repouching, etc.) PROCESS - Coordination of Care X - Simple Patient / Family Education for ongoing care 1 15 []  - 0 Complex (extensive) Patient / Family Education for ongoing care X- 1 10 Staff obtains Chiropractor, Records, T Results / Process Orders est X- 1 10 Staff telephones HHA, Nursing Homes / Clarify orders / etc []  - 0 Routine Transfer to another Facility (non-emergent condition) []  - 0 Routine Hospital Admission (non-emergent condition) X- 1 15 New Admissions / Manufacturing engineer / Ordering NPWT Apligraf, etc. , []  - 0 Emergency Hospital Admission (emergent condition) PROCESS - Special Needs []  - 0 Pediatric / Minor Patient Management []  - 0 Isolation Patient Management []  - 0 Hearing / Language / Visual special needs []  - 0 Assessment of Community assistance (transportation, D/C planning, etc.) []  - 0 Additional assistance / Altered mentation []  - 0 Support Surface(s) Assessment (bed, cushion, seat, etc.) INTERVENTIONS - Miscellaneous []  - 0 External ear exam []  - 0 Patient Transfer (multiple staff / Nurse, adult /  Similar devices) []  - 0 Simple Staple / Suture removal (25 or less) []  - 0 Complex Staple / Suture removal (26 or more) []  - 0 Hypo/Hyperglycemic Management (do not check if billed separately) X- 1 15 Ankle / Brachial Index (ABI) - do not check if billed separately Has the patient been seen at the  hospital within the last three years: Yes Total Score: 110 Level Of Care: New/Established - Level 3 Electronic Signature(s) Signed: 04/20/2023 5:11:22 PM By: Karie Schwalbe RN Entered By: Karie Schwalbe on 04/20/2023 13:29:33 Peter Terry ([id number]) [id number] Page 3 of 11 -------------------------------------------------------------------------------- Compression Therapy Details Patient Name: Date of Service: Peter Terry RD V. 04/20/2023 10:45 A M Medical Record Number: [id number] Patient Account Number: [id number] Date of Birth/Sex: Treating RN: 1956/01/13 (67 y.o. Peter Terry Primary Care Mckynna Vanloan: Herbie Drape Other Clinician: Referring Dewitte Vannice: Treating Peter Terry/Extender: Verita Schneiders in Treatment: 0 Compression Therapy Performed for Wound Assessment: Wound #1 Right,Lateral Lower Leg Performed By: Clinician Karie Schwalbe, RN Compression Type: Four Layer Post Procedure Diagnosis Same as Pre-procedure Notes or Urgo K2 Electronic Signature(s) Signed: 04/20/2023 5:11:22 PM By: Karie Schwalbe RN Entered By: Karie Schwalbe on 04/20/2023 09:01:43 -------------------------------------------------------------------------------- Compression Therapy Details Patient Name: Date of Service: Peter Terry RD V. 04/20/2023 10:45 A M Medical Record Number: [id number] Patient Account Number: [id number] Date of Birth/Sex: Treating RN: 14-Nov-1955 (67 y.o. Peter Terry Primary Care Oshay Stranahan: Herbie Drape Other Clinician: Referring Orren Pietsch: Treating Peter Terry/Extender: Verita Schneiders in Treatment:  0 Compression Therapy Performed for Wound Assessment: Wound #2 Left,Lateral Lower Leg Performed By: Clinician Karie Schwalbe, RN Compression Type: Four Layer Post Procedure Diagnosis Same as Pre-procedure Notes or Urgo K2 Electronic Signature(s) Signed: 04/20/2023 5:11:22 PM By: Karie Schwalbe RN Entered By: Karie Schwalbe on 04/20/2023 09:01:43 -------------------------------------------------------------------------------- Encounter Discharge Information Details Patient Name: Date of Service: Peter Terry RD V. 04/20/2023 10:45 A M Medical Record Number: [id number] Patient Account Number: [id number] Date of Birth/Sex: Treating RN: Dec 19, 1955 (67 y.o. Peter Terry Primary Care Edwards Mckelvie: Herbie Drape Other Clinician: Referring Jennier Schissler: Treating Said Rueb/Extender: Verita Schneiders in Treatment: 0 Encounter Discharge Information Items Post Procedure Vitals Discharge Condition: Stable Temperature (F): 97.8 Ambulatory Status: Ambulatory Pulse (bpm): 81 Discharge Destination: Home Respiratory Rate (breaths/min): [address] V ([id number]) [id number] Page 4 of 11 Transportation: Private Auto Blood Pressure (mmHg): 162/85 Accompanied By: self Schedule Follow-up Appointment: Yes Clinical Summary of Care: Patient Declined Electronic Signature(s) Signed: 04/20/2023 5:11:22 PM By: Karie Schwalbe RN Entered By: Karie Schwalbe on 04/20/2023 13:34:40 -------------------------------------------------------------------------------- Lower Extremity Assessment Details Patient Name: Date of Service: Peter Terry RD V. 04/20/2023 10:45 A M Medical Record Number: [id number] Patient Account Number: [id number] Date of Birth/Sex: Treating RN: 01/04/1956 (67 y.o. Peter Terry Primary Care Clevester Helzer: Herbie Drape Other Clinician: Referring Dean Goldner: Treating Kemaya Dorner/Extender: Verita Schneiders in Treatment:  0 Edema Assessment Assessed: Kyra Searles: No] [Right: No] Edema: [Left: Yes] [Right: Yes] Calf Left: Right: Point of Measurement: 38 cm From Medial Instep 61 cm 62 cm Ankle Left: Right: Point of Measurement: 12 cm From Medial Instep 32.5 cm 33 cm Knee To Floor Left: Right: From Medial Instep 48 cm 48 cm Vascular Assessment Pulses: Dorsalis Pedis Palpable: [Left:Yes] [Right:Yes] Doppler Audible: [Left:Yes] [Right:Yes] Extremity colors, hair growth, and conditions: Extremity Color: [Left:Hyperpigmented] [Right:Hyperpigmented] Hair Growth on Extremity: [Left:No] [Right:No] Temperature of Extremity: [Left:Warm] [Right:Warm] Capillary Refill: [Left:< 3 seconds] [Right:< 3 seconds] Dependent Rubor: [Left:No] [Right:Yes] Blanched when Elevated: [Left:No] [Right:No] Lipodermatosclerosis: [Left:No] [Right:No] Blood Pressure: Brachial: [Left:162] [Right:162] Ankle: [Left:Dorsalis Pedis: 138 0.85] [Right:Dorsalis Pedis: 156 0.96] Toe Nail Assessment Left: Right: Thick: Yes Yes Discolored: Yes Yes Deformed: No No Improper Length and Hygiene: No No Electronic Signature(s) Signed: 04/20/2023 5:11:22 PM By: Karie Schwalbe RN Peter Terry, Peter Terry ([id number]) [id number] Page 5 of 11 Entered  By: Karie Schwalbe on 04/20/2023 13:25:46 -------------------------------------------------------------------------------- Multi Wound Chart Details Patient Name: Date of Service: Peter Terry RD V. 04/20/2023 10:45 A M Medical Record Number: [id number] Patient Account Number: [id number] Date of Birth/Sex: Treating RN: 1956-05-13 (67 y.o. M) Primary Care Albie Arizpe: Herbie Drape Other Clinician: Referring Chamia Schmutz: Treating Levern Kalka/Extender: Verita Schneiders in Treatment: 0 Vital Signs Height(in): 69 Pulse(bpm): 81 Weight(lbs): 310 Blood Pressure(mmHg): 162/85 Body Mass Index(BMI): 45.8 Temperature(F): 97.8 Respiratory Rate(breaths/min): 18 [1:Photos:]  [N/A:N/A] Right, Lateral Lower Leg Left, Lateral Lower Leg N/A Wound Location: Gradually Appeared Gradually Appeared N/A Wounding Event: Lymphedema Lymphedema N/A Primary Etiology: Anemia, Congestive Heart Failure, Anemia, Congestive Heart Failure, N/A Comorbid History: Hypertension, Peripheral Venous Hypertension, Peripheral Venous Disease, Type II Diabetes, Disease, Type II Diabetes, Neuropathy Neuropathy 04/16/2022 04/16/2022 N/A Date Acquired: 0 0 N/A Weeks of Treatment: Open Open N/A Wound Status: No No N/A Wound Recurrence: Yes Yes N/A Clustered Wound: 10x11x0.1 13x15x0.1 N/A Measurements L x W x D (cm) 86.394 153.153 N/A A (cm) : rea 8.639 15.315 N/A Volume (cm) : Full Thickness Without Exposed Full Thickness Without Exposed N/A Classification: Support Structures Support Structures Medium Medium N/A Exudate A mount: Serosanguineous Serosanguineous N/A Exudate Type: red, brown red, brown N/A Exudate Color: N/A Thickened N/A Wound Margin: Small (1-33%) Small (1-33%) N/A Granulation A mount: Pink Pink N/A Granulation Quality: Large (67-100%) Large (67-100%) N/A Necrotic A mount: Eschar, Adherent Slough Eschar, Adherent Slough N/A Necrotic Tissue: Fat Layer (Subcutaneous Tissue): Yes Fat Layer (Subcutaneous Tissue): Yes N/A Exposed Structures: Fascia: No Fascia: No Tendon: No Tendon: No Muscle: No Muscle: No Joint: No Joint: No Bone: No Bone: No Small (1-33%) Small (1-33%) N/A Epithelialization: Debridement - Selective/Open Wound Debridement - Selective/Open Wound N/A Debridement: Pre-procedure Verification/Time Out 11:52 11:52 N/A Taken: Lidocaine 4% Topical Solution Lidocaine 4% Topical Solution N/A Terry Control: [facility] N/A Tissue Debrided: Non-Viable Tissue Non-Viable Tissue N/A Level: 86.35 153.07 N/A Debridement A (sq cm): rea Curette Curette N/A Instrument: Minimum Minimum N/A Bleeding: Pressure Pressure N/A Hemostasis A  chieved: 0 0 N/A Procedural PainHERVE, Peter Terry ([id number]) [id number] Page 6 of 11 0 0 N/A Post Procedural Terry: Procedure was tolerated well Procedure was tolerated well N/A Debridement Treatment Response: 10x11x0.1 13x15x0.1 N/A Post Debridement Measurements L x W x D (cm) 8.639 15.315 N/A Post Debridement Volume: (cm) Scarring: Yes Scarring: Yes N/A Periwound Skin Texture: No Abnormalities Noted No Abnormalities Noted N/A Periwound Skin Moisture: Hemosiderin Staining: Yes Hemosiderin Staining: Yes N/A Periwound Skin Color: No Abnormality No Abnormality N/A Temperature: Compression Therapy Compression Therapy N/A Procedures Performed: Debridement Debridement Treatment Notes Electronic Signature(s) Signed: 04/20/2023 12:13:36 PM By: Duanne Guess MD FACS Previous Signature: 04/20/2023 11:35:20 AM Version By: Duanne Guess MD FACS Entered By: Duanne Guess on 04/20/2023 09:13:36 -------------------------------------------------------------------------------- Multi-Disciplinary Care Plan Details Patient Name: Date of Service: Peter Terry RD V. 04/20/2023 10:45 A M Medical Record Number: [id number] Patient Account Number: [id number] Date of Birth/Sex: Treating RN: 1956-06-27 (67 y.o. Peter Terry Primary Care Ndidi Nesby: Herbie Drape Other Clinician: Referring Anniyah Mood: Treating Jatin Naumann/Extender: Verita Schneiders in Treatment: 0 Active Inactive Wound/Skin Impairment Nursing Diagnoses: Impaired tissue integrity Goals: Patient/caregiver will verbalize understanding of skin care regimen Date Initiated: 04/20/2023 Target Resolution Date: 07/10/2023 Goal Status: Active Interventions: Assess ulceration(s) every visit Treatment Activities: Skin care regimen initiated : 04/20/2023 Notes: Electronic Signature(s) Signed: 04/20/2023 5:11:22 PM By: Karie Schwalbe RN Entered By: Karie Schwalbe on 04/20/2023  13:27:00 -------------------------------------------------------------------------------- Terry Assessment Details Patient Name: Date  of Service: Peter Terry RD V. 04/20/2023 10:45 A M Medical Record Number: [id number] Patient Account Number: [id number] Date of Birth/Sex: Treating RN: 1956-03-10 (67 y.o. Peter Terry Primary Care Sederick Jacobsen: Herbie Drape Other Clinician: Chandra Terry ([id number]) [id number] Page 7 of 11 Referring Cassandra Harbold: Treating Caylin Raby/Extender: Verita Schneiders in Treatment: 0 Active Problems Location of Terry Severity and Description of Terry Patient Has Paino Yes Site Locations Terry Location: Generalized Terry With Dressing Change: No Duration of the Terry. Constant / Intermittento Constant Rate the Terry. Current Terry Level: 4 Worst Terry Level: 10 Least Terry Level: 2 Tolerable Terry Level: 4 Character of Terry Describe the Terry: Difficult to Pinpoint Terry Management and Medication Current Terry Management: Medication: Yes Cold Application: No Rest: Yes Massage: No Activity: No T.E.N.S.: No Heat Application: No Leg drop or elevation: No Is the Current Terry Management Adequate: Inadequate How does your wound impact your activities of daily livingo Sleep: No Bathing: No Appetite: No Relationship With Others: No Bladder Continence: No Emotions: No Bowel Continence: No Work: No Toileting: No Drive: No Dressing: No Hobbies: No Electronic Signature(s) Signed: 04/20/2023 5:11:22 PM By: Karie Schwalbe RN Entered By: Karie Schwalbe on 04/20/2023 13:26:25 -------------------------------------------------------------------------------- Patient/Caregiver Education Details Patient Name: Date of Service: Peter Terry RD V. 9/10/2024andnbsp10:45 A M Medical Record Number: [id number] Patient Account Number: [id number] Date of Birth/Gender: Treating RN: 11-06-1955 (67 y.o. Peter Terry Primary Care  Physician: Herbie Drape Other Clinician: Referring Physician: Treating Physician/Extender: Verita Schneiders in Treatment: 0 Education Assessment Education Provided To: Patient Education Topics Provided Peter Terry, [name] [id number] Page 8 of 11 Wound/Skin Impairment: Methods: Explain/Verbal Responses: State content correctly Electronic Signature(s) Signed: 04/20/2023 5:11:22 PM By: Karie Schwalbe RN Entered By: Karie Schwalbe on 04/20/2023 13:27:09 -------------------------------------------------------------------------------- Wound Assessment Details Patient Name: Date of Service: Peter Terry RD V. 04/20/2023 10:45 A M Medical Record Number: [id number] Patient Account Number: [id number] Date of Birth/Sex: Treating RN: [birth year] (67 y.o. Peter Terry Primary Care Saidi Santacroce: Herbie Drape Other Clinician: Referring Shantina Chronister: Treating Valeda Corzine/Extender: Verita Schneiders in Treatment: 0 Wound Status Wound Number: 1 Primary Lymphedema Etiology: Wound Location: Right, Lateral Lower Leg Wound Open Wounding Event: Gradually Appeared Status: Date Acquired: 04/16/2022 Comorbid Anemia, Congestive Heart Failure, Hypertension, Peripheral Weeks Of Treatment: 0 History: Venous Disease, Type II Diabetes, Neuropathy Clustered Wound: Yes Photos Wound Measurements Length: (cm) 10 Width: (cm) 11 Depth: (cm) 0.1 Area: (cm) 86.394 Volume: (cm) 8.639 % Reduction in Area: % Reduction in Volume: Epithelialization: Small (1-33%) Tunneling: No Undermining: No Wound Description Classification: Full Thickness Without Exposed Support Structures Exudate Amount: Medium Exudate Type: Serosanguineous Exudate Color: red, brown Foul Odor After Cleansing: No Slough/Fibrino Yes Wound Bed Granulation Amount: Small (1-33%) Exposed Structure Granulation Quality: Pink Fascia Exposed: No Necrotic Amount: Large  (67-100%) Fat Layer (Subcutaneous Tissue) Exposed: Yes Necrotic Quality: Eschar, Adherent Slough Tendon Exposed: No Muscle Exposed: No Joint Exposed: No Bone Exposed: No Periwound Skin Texture Texture Color No Abnormalities Noted: No No Abnormalities Noted: No Peter Terry, Peter Terry ([id number]) [id number] Page 9 of 11 Scarring: Yes Hemosiderin Staining: Yes Moisture Temperature / Terry No Abnormalities Noted: Yes Temperature: No Abnormality Treatment Notes Wound #1 (Lower Leg) Wound Laterality: Right, Lateral Cleanser Soap and Water Discharge Instruction: May shower and wash wound with dial antibacterial soap and water prior to dressing change. Vashe 5.8 (oz) Discharge Instruction: Cleanse the wound with Vashe prior to applying a clean dressing using gauze sponges, not tissue or  cotton balls. Wound Cleanser Discharge Instruction: Cleanse the wound with wound cleanser prior to applying a clean dressing using gauze sponges, not tissue or cotton balls. Peri-Wound Care Sween Lotion (Moisturizing lotion) Discharge Instruction: Apply moisturizing lotion as directed Topical Primary Dressing Maxorb Extra Ag+ Alginate Dressing, 4x4.75 (in/in) Discharge Instruction: Apply to wound bed as instructed Secondary Dressing ABD Pad, 8x10 Discharge Instruction: Apply over primary dressing as directed. Secured With Compression Wrap Urgo K2, (equivalent to a 4 layer) two layer compression system, regular Discharge Instruction: Apply Urgo K2 as directed (alternative to 4 layer compression). [place] Discharge Instruction: over leg wraps Compression Stockings Jobst Farrow Wrap 4000 Quantity: 1 Right Leg Compression Amount: 30-40 mmHg Discharge Instruction: Apply Peter Terry daily as instructed. Apply first thing in the morning, remove at night before bed. Add-Ons Electronic Signature(s) Signed: 04/20/2023 5:11:22 PM By: Karie Schwalbe RN Entered By: Karie Schwalbe  on 04/20/2023 08:58:06 -------------------------------------------------------------------------------- Wound Assessment Details Patient Name: Date of Service: Peter Terry RD V. 04/20/2023 10:45 A M Medical Record Number: [id number] Patient Account Number: [id number] Date of Birth/Sex: Treating RN: [birth year] (67 y.o. Peter Terry Primary Care Marsena Taff: Herbie Drape Other Clinician: Referring Charis Juliana: Treating Peter Terry/Extender: Verita Schneiders in Treatment: 0 Wound Status Wound Number: 2 Primary Lymphedema Etiology: Wound Location: Left, Lateral Lower Leg Wound Open Wounding Event: Gradually Appeared Status: Date Acquired: 04/16/2022 Comorbid Anemia, Congestive Heart Failure, Hypertension, Peripheral Weeks Of Treatment: 0 History: Venous Disease, Type II Diabetes, Neuropathy Clustered Wound: Peter Terry, Peter Terry ([id number]) [id number] Page 10 of 11 Photos Wound Measurements Length: (cm) 13 Width: (cm) 15 Depth: (cm) 0.1 Area: (cm) 153.153 Volume: (cm) 15.315 % Reduction in Area: % Reduction in Volume: Epithelialization: Small (1-33%) Tunneling: No Undermining: No Wound Description Classification: Full Thickness Without Exposed Support Structures Wound Margin: Thickened Exudate Amount: Medium Exudate Type: Serosanguineous Exudate Color: red, brown Foul Odor After Cleansing: No Slough/Fibrino Yes Wound Bed Granulation Amount: Small (1-33%) Exposed Structure Granulation Quality: Pink Fascia Exposed: No Necrotic Amount: Large (67-100%) Fat Layer (Subcutaneous Tissue) Exposed: Yes Necrotic Quality: Eschar, Adherent Slough Tendon Exposed: No Muscle Exposed: No Joint Exposed: No Bone Exposed: No Periwound Skin Texture Texture Color No Abnormalities Noted: No No Abnormalities Noted: No Scarring: Yes Hemosiderin Staining: Yes Moisture Temperature / Terry No Abnormalities Noted: Yes Temperature: No  Abnormality Treatment Notes Wound #2 (Lower Leg) Wound Laterality: Left, Lateral Cleanser Soap and Water Discharge Instruction: May shower and wash wound with dial antibacterial soap and water prior to dressing change. Vashe 5.8 (oz) Discharge Instruction: Cleanse the wound with Vashe prior to applying a clean dressing using gauze sponges, not tissue or cotton balls. Wound Cleanser Discharge Instruction: Cleanse the wound with wound cleanser prior to applying a clean dressing using gauze sponges, not tissue or cotton balls. Peri-Wound Care Sween Lotion (Moisturizing lotion) Discharge Instruction: Apply moisturizing lotion as directed Topical Primary Dressing Maxorb Extra Ag+ Alginate Dressing, 4x4.75 (in/in) Discharge Instruction: Apply to wound bed as instructed Secondary Dressing ABD Pad, 8x10 Discharge Instruction: Apply over primary dressing as directed. Secured With Peter Terry, Peter Terry ([id number]) [id number] Page 11 of 11 Compression Wrap Urgo K2, (equivalent to a 4 layer) two layer compression system, regular Discharge Instruction: Apply Urgo K2 as directed (alternative to 4 layer compression). [place] Discharge Instruction: over leg wraps Compression Stockings Jobst Farrow Wrap 4000 Quantity: 1 Left Leg Compression Amount: 30-40 mmHg Discharge Instruction: Apply Peter Terry daily as instructed. Apply first thing in the morning, remove at night before bed. Add-Ons Electronic  Signature(s) Signed: 04/20/2023 5:11:22 PM By: Karie Schwalbe RN Entered By: Karie Schwalbe on 04/20/2023 08:41:54 -------------------------------------------------------------------------------- Vitals Details Patient Name: Date of Service: Peter Terry RD V. 04/20/2023 10:45 A M Medical Record Number: [id number] Patient Account Number: [id number] Date of Birth/Sex: Treating RN: [birth year] (67 y.o. Peter Terry Primary Care Aliviah Spain: Herbie Drape Other  Clinician: Referring Jazir Newey: Treating Brandt Chaney/Extender: Verita Schneiders in Treatment: 0 Vital Signs Time Taken: 10:59 Temperature (F): 97.8 Height (in): 69 Pulse (bpm): 81 Weight (lbs): 310 Respiratory Rate (breaths/min): 18 Body Mass Index (BMI): 45.8 Blood Pressure (mmHg): 162/85 Reference Range: 80 - 120 mg / dl Electronic Signature(s) Signed: 04/20/2023 5:11:22 PM By: Karie Schwalbe RN Entered By: Karie Schwalbe on 04/20/2023 08:23:46

## 2023-04-22 ENCOUNTER — Telehealth: Payer: Self-pay | Admitting: Family Medicine

## 2023-04-22 NOTE — Telephone Encounter (Signed)
HH ORDERS   Caller Name: [facility] Agency Name: [facility]  Callback Phone #: [phone]  Service Requested: She went to see pt for his HH fu and realised pt is not home bound. He [facility] T[facility]/20/23 and has a f/u on the 04/28/23

## 2023-04-27 NOTE — Telephone Encounter (Signed)
Lft VM to rtn call. Dm/cma  

## 2023-04-28 ENCOUNTER — Encounter (HOSPITAL_BASED_OUTPATIENT_CLINIC_OR_DEPARTMENT_OTHER): Payer: Medicare HMO | Admitting: General Surgery

## 2023-04-28 DIAGNOSIS — E11622 Type 2 diabetes mellitus with other skin ulcer: Secondary | ICD-10-CM | POA: Diagnosis not present

## 2023-04-28 DIAGNOSIS — I890 Lymphedema, not elsewhere classified: Secondary | ICD-10-CM | POA: Diagnosis not present

## 2023-04-28 NOTE — Progress Notes (Addendum)
04/28/2023 2:31:26 PM By: Tommie Ard RN Entered By: Tommie Ard on 04/28/2023 10:10:42 -------------------------------------------------------------------------------- Patient/Caregiver Education Details Patient Name: Date of Service: Peter Terry RD V. 9/18/2024andnbsp1:00 PM Medical Record Number: [id number] Patient Account Number: [id number] Date of Birth/Gender: Treating RN: 1955-11-16 (67 y.o. [name] Primary Care Physician: Herbie Drape Other Clinician: Referring Physician: Treating Physician/Extender: Verita Schneiders in Treatment: 1 Education Assessment Education Provided To: Patient Education Topics Provided Wound Debridement: Methods: Explain/Verbal Responses: Reinforcements needed, State content correctly Wound/Skin Impairment: Methods: Explain/Verbal Responses: Reinforcements needed, State content correctly Electronic Signature(s) Signed: 04/28/2023 2:31:26 PM By: Tommie Ard RN Entered By: Tommie Ard on 04/28/2023 11:18:54 -------------------------------------------------------------------------------- Wound Assessment Details Patient Name: Date of Service: Peter Terry RD V. 04/28/2023 1:00 PM Medical Record Number: [id number] Patient Account Number: [id number] Date of Birth/Sex: Treating RN: Jun 09, 1956 (67 y.o. [name] Primary Care Jaylen Knope: Herbie Drape Other Clinician: Referring Julieta Rogalski: Treating Victor Langenbach/Extender: Verita Schneiders in Treatment: 1 Wound Status Wound Number: 1 Primary Lymphedema Etiology: Wound Location: Right,  Lateral Lower Leg Wound Open Wounding Event: Gradually Appeared Status: Date Acquired: 04/16/2022 Comorbid Anemia, Congestive Heart Failure, Hypertension, Peripheral Weeks Of Treatment: 1 History: Venous Disease, Type II Diabetes, Neuropathy Clustered Wound: [address] THORTON, CSIZMADIA ([id number]) [id number] Page 8 of 11 Wound Measurements Length: (cm) 11 Width: (cm) 11.5 Depth: (cm) 0.1 Area: (cm) 99.353 Volume: (cm) 9.935 % Reduction in Area: -15% % Reduction in Volume: -15% Epithelialization: Small (1-33%) Tunneling: No Undermining: No Wound Description Classification: Full Thickness Without Exposed Support Structures Exudate Amount: Medium Exudate Type: Serosanguineous Exudate Color: red, brown Foul Odor After Cleansing: No Slough/Fibrino Yes Wound Bed Granulation Amount: Small (1-33%) Exposed Structure Granulation Quality: Pink, Hyper-granulation Fascia Exposed: No Necrotic Amount: Large (67-100%) Fat Layer (Subcutaneous Tissue) Exposed: Yes Necrotic Quality: Eschar, Adherent Slough Tendon Exposed: No Muscle Exposed: No Joint Exposed: No Bone Exposed: No Periwound Skin Texture Texture Color No Abnormalities Noted: No No Abnormalities Noted: No Scarring: Yes Hemosiderin Staining: Yes Moisture Temperature / Pain No Abnormalities Noted: Yes Temperature: No Abnormality Treatment Notes Wound #1 (Lower Leg) Wound Laterality: Right, Lateral Cleanser Soap and Water Discharge Instruction: May shower and wash wound with dial antibacterial soap and water prior to dressing change. Vashe 5.8 (oz) Discharge Instruction: Cleanse the wound with Vashe prior to applying a clean dressing using gauze sponges, not tissue or cotton balls. Wound Cleanser Discharge Instruction: Cleanse the wound with wound cleanser prior to applying a clean dressing using gauze sponges, not tissue or cotton balls. Peri-Wound Care Sween Lotion (Moisturizing  lotion) Discharge Instruction: Apply moisturizing lotion as directed Topical Gentamicin Discharge Instruction: As directed by physician Mupirocin Ointment Discharge Instruction: Apply Mupirocin (Bactroban) as instructed Ketoconazole Cream 2% Discharge Instruction: Apply Ketoconazole as directed Primary Dressing Maxorb Extra Ag+ Alginate Dressing, 4x4.75 (in/in) Discharge Instruction: Apply to wound bed as instructed [address] LIONAL, NICKLAUS ([id number]) [id number] Page 9 of 11 ABD Pad, 8x10 Discharge Instruction: Apply over primary dressing as directed. Secured With Compression Wrap Urgo K2, (equivalent to a 4 layer) two layer compression system, regular Discharge Instruction: Apply Urgo K2 as directed (alternative to 4 layer compression). Stockinette Discharge Instruction: over leg wraps Compression Stockings Jobst Farrow Wrap 4000 Quantity: 1 Right Leg Compression Amount: 30-40 mmHg Discharge Instruction: Apply Renee Pain daily as instructed. Apply first thing in the morning, remove at night before bed. Add-Ons Electronic Signature(s) Signed: 04/28/2023 2:31:26 PM By: Tommie Ard RN Entered By: Tommie Ard on 04/28/2023 10:28:03 --------------------------------------------------------------------------------  61 cm 60.8 cm Ankle Left: Right: Point of Measurement: 12 cm From Medial Instep 26.4 cm 28.5 cm Vascular Assessment Pulses: Dorsalis Pedis Palpable: [Left:Yes] [Right:Yes] Extremity colors, hair growth, and conditions: Extremity Color: [Left:Hyperpigmented] [Right:Hyperpigmented] Hair Growth on Extremity: [Left:No] [Right:No] Temperature of Extremity: [Left:Warm] [Right:Warm] Capillary Refill: [Left:< 3 seconds] [Right:< 3 seconds] Dependent Rubor: [Left:No No] [Right:Yes No] Electronic Signature(s) Signed: 04/28/2023 2:31:26 PM By: Tommie Ard RN Entered By: Tommie Ard on 04/28/2023 10:23:32 -------------------------------------------------------------------------------- Multi Wound Chart Details Patient Name: Date of Service: Peter Terry RD V. 04/28/2023 1:00 PM Medical Record Number: [id number] Patient Account Number: [id number] Date of Birth/Sex: Treating RN: [birth year] (67 y.o. M) Primary Care Tishia Maestre: Herbie Drape Other Clinician: Referring Glendene Wyer: Treating Fardeen Steinberger/Extender: Verita Schneiders in Treatment: 1 Vital  Signs Height(in): 69 Capillary Blood Glucose(mg/dl): 440 Weight(lbs): 102 Pulse(bpm): 87 Body Mass Index(BMI): 45.8 Blood Pressure(mmHg): 195/93 Temperature(F): 97.9 Respiratory Rate(breaths/min): 18 [1:Photos:] [N/A:N/A] Right, Lateral Lower Leg Left, Lateral Lower Leg N/A Wound Location: Gradually Appeared Gradually Appeared N/A Wounding Event: Lymphedema Lymphedema N/A Primary Etiology: Anemia, Congestive Heart Failure, Anemia, Congestive Heart Failure, N/A Comorbid History: Hypertension, Peripheral Venous Hypertension, Peripheral Venous Disease, Type II Diabetes, Disease, Type II Diabetes, Neuropathy Neuropathy 04/16/2022 04/16/2022 N/A Date Acquired: 1 1 N/A Weeks of Treatment: Open Open N/A Wound Status: No No N/A Wound Recurrence: Yes Yes N/A Clustered Wound: 11x11.5x0.1 11.5x13.5x0.1 N/A Measurements L x W x D (cm) 99.353 121.933 N/A A (cm) : rea 9.935 12.193 N/A Volume (cm) : -15.00% 20.40% N/A % Reduction in Area: HAMLET, AZOULAY ([id number]) [id number] Page 4 of 11 -15.00% 20.40% N/A % Reduction in Volume: Full Thickness Without Exposed Full Thickness Without Exposed N/A Classification: Support Structures Support Structures Medium Medium N/A Exudate A mount: Serosanguineous Serosanguineous N/A Exudate Type: red, brown red, brown N/A Exudate Color: N/A Thickened N/A Wound Margin: Small (1-33%) Small (1-33%) N/A Granulation A mount: Pink, Hyper-granulation Pink, Hyper-granulation N/A Granulation Quality: Large (67-100%) Large (67-100%) N/A Necrotic A mount: Eschar, Adherent Slough Eschar, Adherent Slough N/A Necrotic Tissue: Fat Layer (Subcutaneous Tissue): Yes Fat Layer (Subcutaneous Tissue): Yes N/A Exposed Structures: Fascia: No Fascia: No Tendon: No Tendon: No Muscle: No Muscle: No Joint: No Joint: No Bone: No Bone: No Small (1-33%) Small (1-33%) N/A Epithelialization: Debridement - Selective/Open Wound  Debridement - Selective/Open Wound N/A Debridement: Pre-procedure Verification/Time Out 13:40 13:40 N/A Taken: Lidocaine 5% topical ointment Lidocaine 5% topical ointment N/A Pain Control: [employer] N/A Tissue Debrided: Non-Viable Tissue Non-Viable Tissue N/A Level: 103.82 121.87 N/A Debridement A (sq cm): rea Curette Curette N/A Instrument: Minimum Minimum N/A Bleeding: Pressure Pressure N/A Hemostasis A chieved: Procedure was tolerated well Procedure was tolerated well N/A Debridement Treatment Response: 11.5x11.5x0.1 11.5x13.5x0.1 N/A Post Debridement Measurements L x W x D (cm) 10.387 12.193 N/A Post Debridement Volume: (cm) Scarring: Yes Scarring: Yes N/A Periwound Skin Texture: No Abnormalities Noted No Abnormalities Noted N/A Periwound Skin Moisture: Hemosiderin Staining: Yes Hemosiderin Staining: Yes N/A Periwound Skin Color: No Abnormality No Abnormality N/A Temperature: Compression Therapy Compression Therapy N/A Procedures Performed: Debridement Debridement Treatment Notes Wound #1 (Lower Leg) Wound Laterality: Right, Lateral Cleanser Soap and Water Discharge Instruction: May shower and wash wound with dial antibacterial soap and water prior to dressing change. Vashe 5.8 (oz) Discharge Instruction: Cleanse the wound with Vashe prior to applying a clean dressing using gauze sponges, not tissue or cotton balls. Wound Cleanser Discharge Instruction: Cleanse the wound with wound cleanser prior to applying a clean dressing using gauze sponges,  Wound Assessment Details Patient Name: Date of Service: Peter Terry RD V. 04/28/2023 1:00 PM Medical Record Number: [id number] Patient Account Number: [id number] Date of Birth/Sex: Treating RN: 03/29/1956 (67 y.o. [name] Primary Care Alegandro Macnaughton: Herbie Drape Other Clinician: Referring Dmario Russom: Treating Elita Dame/Extender: Verita Schneiders in Treatment: 1 Wound Status Wound Number: 2 Primary Lymphedema Etiology: Wound Location: Left, Lateral Lower Leg Wound Open Wounding Event: Gradually Appeared Status: Date Acquired: 04/16/2022 Comorbid Anemia, Congestive Heart Failure, Hypertension,  Peripheral Weeks Of Treatment: 1 History: Venous Disease, Type II Diabetes, Neuropathy Clustered Wound: Yes Photos Wound Measurements Length: (cm) 11.5 Width: (cm) 13.5 Depth: (cm) 0.1 Area: (cm) 121.933 Volume: (cm) 12.193 % Reduction in Area: 20.4% % Reduction in Volume: 20.4% Epithelialization: Small (1-33%) Tunneling: No Undermining: No Wound Description Classification: Full Thickness Without Exposed Support Structures Wound Margin: Thickened Exudate Amount: Medium Exudate Type: Serosanguineous Falck, Levan V ([id number]) Exudate Color: red, brown Foul Odor After Cleansing: No Slough/Fibrino Yes [phone].pdf Page 10 of 11 Wound Bed Granulation Amount: Small (1-33%) Exposed Structure Granulation Quality: Pink, Hyper-granulation Fascia Exposed: No Necrotic Amount: Large (67-100%) Fat Layer (Subcutaneous Tissue) Exposed: Yes Necrotic Quality: Eschar, Adherent Slough Tendon Exposed: No Muscle Exposed: No Joint Exposed: No Bone Exposed: No Periwound Skin Texture Texture Color No Abnormalities Noted: No No Abnormalities Noted: No Scarring: Yes Hemosiderin Staining: Yes Moisture Temperature / Pain No Abnormalities Noted: Yes Temperature: No Abnormality Treatment Notes Wound #2 (Lower Leg) Wound Laterality: Left, Lateral Cleanser Soap and Water Discharge Instruction: May shower and wash wound with dial antibacterial soap and water prior to dressing change. Vashe 5.8 (oz) Discharge Instruction: Cleanse the wound with Vashe prior to applying a clean dressing using gauze sponges, not tissue or cotton balls. Wound Cleanser Discharge Instruction: Cleanse the wound with wound cleanser prior to applying a clean dressing using gauze sponges, not tissue or cotton balls. Peri-Wound Care Sween Lotion (Moisturizing lotion) Discharge Instruction: Apply moisturizing lotion as directed Topical Gentamicin Discharge Instruction: As directed by  physician Mupirocin Ointment Discharge Instruction: Apply Mupirocin (Bactroban) as instructed Ketoconazole Cream 2% Discharge Instruction: Apply Ketoconazole as directed Primary Dressing Maxorb Extra Ag+ Alginate Dressing, 4x4.75 (in/in) Discharge Instruction: Apply to wound bed as instructed Secondary Dressing ABD Pad, 8x10 Discharge Instruction: Apply over primary dressing as directed. Secured With Compression Wrap Urgo K2, (equivalent to a 4 layer) two layer compression system, regular Discharge Instruction: Apply Urgo K2 as directed (alternative to 4 layer compression). Stockinette Discharge Instruction: over leg wraps Compression Stockings Jobst Farrow Wrap 4000 Quantity: 1 Left Leg Compression Amount: 30-40 mmHg Discharge Instruction: Apply Renee Pain daily as instructed. Apply first thing in the morning, remove at night before bed. Add-Ons Electronic Signature(s) Signed: 04/28/2023 2:31:26 PM By: Tommie Ard RN Entered By: Tommie Ard on 04/28/2023 10:28:46 Peter Terry ([id number]) [id number] Page 11 of 11 -------------------------------------------------------------------------------- Vitals Details Patient Name: Date of Service: Peter Terry RD V. 04/28/2023 1:00 PM Medical Record Number: [id number] Patient Account Number: [id number] Date of Birth/Sex: Treating RN: 1956/03/08 (67 y.o. [name] Primary Care Maycol Hoying: Herbie Drape Other Clinician: Referring Neola Worrall: Treating Jadamarie Butson/Extender: Verita Schneiders in Treatment: 1 Vital Signs Time Taken: 13:10 Temperature (F): 97.9 Height (in): 69 Pulse (bpm): 87 Weight (lbs): 310 Respiratory Rate (breaths/min): 18 Body Mass Index (BMI): 45.8 Blood Pressure (mmHg): 195/93 Capillary Blood Glucose (mg/dl): 237 Reference Range: 80 - 120 mg / dl Electronic Signature(s) Signed: 04/28/2023 2:31:26 PM By: Tommie Ard RN Entered By: Tommie Ard on 04/28/2023  61 cm 60.8 cm Ankle Left: Right: Point of Measurement: 12 cm From Medial Instep 26.4 cm 28.5 cm Vascular Assessment Pulses: Dorsalis Pedis Palpable: [Left:Yes] [Right:Yes] Extremity colors, hair growth, and conditions: Extremity Color: [Left:Hyperpigmented] [Right:Hyperpigmented] Hair Growth on Extremity: [Left:No] [Right:No] Temperature of Extremity: [Left:Warm] [Right:Warm] Capillary Refill: [Left:< 3 seconds] [Right:< 3 seconds] Dependent Rubor: [Left:No No] [Right:Yes No] Electronic Signature(s) Signed: 04/28/2023 2:31:26 PM By: Tommie Ard RN Entered By: Tommie Ard on 04/28/2023 10:23:32 -------------------------------------------------------------------------------- Multi Wound Chart Details Patient Name: Date of Service: Peter Terry RD V. 04/28/2023 1:00 PM Medical Record Number: [id number] Patient Account Number: [id number] Date of Birth/Sex: Treating RN: [birth year] (67 y.o. M) Primary Care Tishia Maestre: Herbie Drape Other Clinician: Referring Glendene Wyer: Treating Fardeen Steinberger/Extender: Verita Schneiders in Treatment: 1 Vital  Signs Height(in): 69 Capillary Blood Glucose(mg/dl): 440 Weight(lbs): 102 Pulse(bpm): 87 Body Mass Index(BMI): 45.8 Blood Pressure(mmHg): 195/93 Temperature(F): 97.9 Respiratory Rate(breaths/min): 18 [1:Photos:] [N/A:N/A] Right, Lateral Lower Leg Left, Lateral Lower Leg N/A Wound Location: Gradually Appeared Gradually Appeared N/A Wounding Event: Lymphedema Lymphedema N/A Primary Etiology: Anemia, Congestive Heart Failure, Anemia, Congestive Heart Failure, N/A Comorbid History: Hypertension, Peripheral Venous Hypertension, Peripheral Venous Disease, Type II Diabetes, Disease, Type II Diabetes, Neuropathy Neuropathy 04/16/2022 04/16/2022 N/A Date Acquired: 1 1 N/A Weeks of Treatment: Open Open N/A Wound Status: No No N/A Wound Recurrence: Yes Yes N/A Clustered Wound: 11x11.5x0.1 11.5x13.5x0.1 N/A Measurements L x W x D (cm) 99.353 121.933 N/A A (cm) : rea 9.935 12.193 N/A Volume (cm) : -15.00% 20.40% N/A % Reduction in Area: HAMLET, AZOULAY ([id number]) [id number] Page 4 of 11 -15.00% 20.40% N/A % Reduction in Volume: Full Thickness Without Exposed Full Thickness Without Exposed N/A Classification: Support Structures Support Structures Medium Medium N/A Exudate A mount: Serosanguineous Serosanguineous N/A Exudate Type: red, brown red, brown N/A Exudate Color: N/A Thickened N/A Wound Margin: Small (1-33%) Small (1-33%) N/A Granulation A mount: Pink, Hyper-granulation Pink, Hyper-granulation N/A Granulation Quality: Large (67-100%) Large (67-100%) N/A Necrotic A mount: Eschar, Adherent Slough Eschar, Adherent Slough N/A Necrotic Tissue: Fat Layer (Subcutaneous Tissue): Yes Fat Layer (Subcutaneous Tissue): Yes N/A Exposed Structures: Fascia: No Fascia: No Tendon: No Tendon: No Muscle: No Muscle: No Joint: No Joint: No Bone: No Bone: No Small (1-33%) Small (1-33%) N/A Epithelialization: Debridement - Selective/Open Wound  Debridement - Selective/Open Wound N/A Debridement: Pre-procedure Verification/Time Out 13:40 13:40 N/A Taken: Lidocaine 5% topical ointment Lidocaine 5% topical ointment N/A Pain Control: [employer] N/A Tissue Debrided: Non-Viable Tissue Non-Viable Tissue N/A Level: 103.82 121.87 N/A Debridement A (sq cm): rea Curette Curette N/A Instrument: Minimum Minimum N/A Bleeding: Pressure Pressure N/A Hemostasis A chieved: Procedure was tolerated well Procedure was tolerated well N/A Debridement Treatment Response: 11.5x11.5x0.1 11.5x13.5x0.1 N/A Post Debridement Measurements L x W x D (cm) 10.387 12.193 N/A Post Debridement Volume: (cm) Scarring: Yes Scarring: Yes N/A Periwound Skin Texture: No Abnormalities Noted No Abnormalities Noted N/A Periwound Skin Moisture: Hemosiderin Staining: Yes Hemosiderin Staining: Yes N/A Periwound Skin Color: No Abnormality No Abnormality N/A Temperature: Compression Therapy Compression Therapy N/A Procedures Performed: Debridement Debridement Treatment Notes Wound #1 (Lower Leg) Wound Laterality: Right, Lateral Cleanser Soap and Water Discharge Instruction: May shower and wash wound with dial antibacterial soap and water prior to dressing change. Vashe 5.8 (oz) Discharge Instruction: Cleanse the wound with Vashe prior to applying a clean dressing using gauze sponges, not tissue or cotton balls. Wound Cleanser Discharge Instruction: Cleanse the wound with wound cleanser prior to applying a clean dressing using gauze sponges,  Wound Assessment Details Patient Name: Date of Service: Peter Terry RD V. 04/28/2023 1:00 PM Medical Record Number: [id number] Patient Account Number: [id number] Date of Birth/Sex: Treating RN: 03/29/1956 (67 y.o. [name] Primary Care Alegandro Macnaughton: Herbie Drape Other Clinician: Referring Dmario Russom: Treating Elita Dame/Extender: Verita Schneiders in Treatment: 1 Wound Status Wound Number: 2 Primary Lymphedema Etiology: Wound Location: Left, Lateral Lower Leg Wound Open Wounding Event: Gradually Appeared Status: Date Acquired: 04/16/2022 Comorbid Anemia, Congestive Heart Failure, Hypertension,  Peripheral Weeks Of Treatment: 1 History: Venous Disease, Type II Diabetes, Neuropathy Clustered Wound: Yes Photos Wound Measurements Length: (cm) 11.5 Width: (cm) 13.5 Depth: (cm) 0.1 Area: (cm) 121.933 Volume: (cm) 12.193 % Reduction in Area: 20.4% % Reduction in Volume: 20.4% Epithelialization: Small (1-33%) Tunneling: No Undermining: No Wound Description Classification: Full Thickness Without Exposed Support Structures Wound Margin: Thickened Exudate Amount: Medium Exudate Type: Serosanguineous Falck, Levan V ([id number]) Exudate Color: red, brown Foul Odor After Cleansing: No Slough/Fibrino Yes [phone].pdf Page 10 of 11 Wound Bed Granulation Amount: Small (1-33%) Exposed Structure Granulation Quality: Pink, Hyper-granulation Fascia Exposed: No Necrotic Amount: Large (67-100%) Fat Layer (Subcutaneous Tissue) Exposed: Yes Necrotic Quality: Eschar, Adherent Slough Tendon Exposed: No Muscle Exposed: No Joint Exposed: No Bone Exposed: No Periwound Skin Texture Texture Color No Abnormalities Noted: No No Abnormalities Noted: No Scarring: Yes Hemosiderin Staining: Yes Moisture Temperature / Pain No Abnormalities Noted: Yes Temperature: No Abnormality Treatment Notes Wound #2 (Lower Leg) Wound Laterality: Left, Lateral Cleanser Soap and Water Discharge Instruction: May shower and wash wound with dial antibacterial soap and water prior to dressing change. Vashe 5.8 (oz) Discharge Instruction: Cleanse the wound with Vashe prior to applying a clean dressing using gauze sponges, not tissue or cotton balls. Wound Cleanser Discharge Instruction: Cleanse the wound with wound cleanser prior to applying a clean dressing using gauze sponges, not tissue or cotton balls. Peri-Wound Care Sween Lotion (Moisturizing lotion) Discharge Instruction: Apply moisturizing lotion as directed Topical Gentamicin Discharge Instruction: As directed by  physician Mupirocin Ointment Discharge Instruction: Apply Mupirocin (Bactroban) as instructed Ketoconazole Cream 2% Discharge Instruction: Apply Ketoconazole as directed Primary Dressing Maxorb Extra Ag+ Alginate Dressing, 4x4.75 (in/in) Discharge Instruction: Apply to wound bed as instructed Secondary Dressing ABD Pad, 8x10 Discharge Instruction: Apply over primary dressing as directed. Secured With Compression Wrap Urgo K2, (equivalent to a 4 layer) two layer compression system, regular Discharge Instruction: Apply Urgo K2 as directed (alternative to 4 layer compression). Stockinette Discharge Instruction: over leg wraps Compression Stockings Jobst Farrow Wrap 4000 Quantity: 1 Left Leg Compression Amount: 30-40 mmHg Discharge Instruction: Apply Renee Pain daily as instructed. Apply first thing in the morning, remove at night before bed. Add-Ons Electronic Signature(s) Signed: 04/28/2023 2:31:26 PM By: Tommie Ard RN Entered By: Tommie Ard on 04/28/2023 10:28:46 Peter Terry ([id number]) [id number] Page 11 of 11 -------------------------------------------------------------------------------- Vitals Details Patient Name: Date of Service: Peter Terry RD V. 04/28/2023 1:00 PM Medical Record Number: [id number] Patient Account Number: [id number] Date of Birth/Sex: Treating RN: 1956/03/08 (67 y.o. [name] Primary Care Maycol Hoying: Herbie Drape Other Clinician: Referring Neola Worrall: Treating Jadamarie Butson/Extender: Verita Schneiders in Treatment: 1 Vital Signs Time Taken: 13:10 Temperature (F): 97.9 Height (in): 69 Pulse (bpm): 87 Weight (lbs): 310 Respiratory Rate (breaths/min): 18 Body Mass Index (BMI): 45.8 Blood Pressure (mmHg): 195/93 Capillary Blood Glucose (mg/dl): 237 Reference Range: 80 - 120 mg / dl Electronic Signature(s) Signed: 04/28/2023 2:31:26 PM By: Tommie Ard RN Entered By: Tommie Ard on 04/28/2023  61 cm 60.8 cm Ankle Left: Right: Point of Measurement: 12 cm From Medial Instep 26.4 cm 28.5 cm Vascular Assessment Pulses: Dorsalis Pedis Palpable: [Left:Yes] [Right:Yes] Extremity colors, hair growth, and conditions: Extremity Color: [Left:Hyperpigmented] [Right:Hyperpigmented] Hair Growth on Extremity: [Left:No] [Right:No] Temperature of Extremity: [Left:Warm] [Right:Warm] Capillary Refill: [Left:< 3 seconds] [Right:< 3 seconds] Dependent Rubor: [Left:No No] [Right:Yes No] Electronic Signature(s) Signed: 04/28/2023 2:31:26 PM By: Tommie Ard RN Entered By: Tommie Ard on 04/28/2023 10:23:32 -------------------------------------------------------------------------------- Multi Wound Chart Details Patient Name: Date of Service: Peter Terry RD V. 04/28/2023 1:00 PM Medical Record Number: [id number] Patient Account Number: [id number] Date of Birth/Sex: Treating RN: [birth year] (67 y.o. M) Primary Care Tishia Maestre: Herbie Drape Other Clinician: Referring Glendene Wyer: Treating Fardeen Steinberger/Extender: Verita Schneiders in Treatment: 1 Vital  Signs Height(in): 69 Capillary Blood Glucose(mg/dl): 440 Weight(lbs): 102 Pulse(bpm): 87 Body Mass Index(BMI): 45.8 Blood Pressure(mmHg): 195/93 Temperature(F): 97.9 Respiratory Rate(breaths/min): 18 [1:Photos:] [N/A:N/A] Right, Lateral Lower Leg Left, Lateral Lower Leg N/A Wound Location: Gradually Appeared Gradually Appeared N/A Wounding Event: Lymphedema Lymphedema N/A Primary Etiology: Anemia, Congestive Heart Failure, Anemia, Congestive Heart Failure, N/A Comorbid History: Hypertension, Peripheral Venous Hypertension, Peripheral Venous Disease, Type II Diabetes, Disease, Type II Diabetes, Neuropathy Neuropathy 04/16/2022 04/16/2022 N/A Date Acquired: 1 1 N/A Weeks of Treatment: Open Open N/A Wound Status: No No N/A Wound Recurrence: Yes Yes N/A Clustered Wound: 11x11.5x0.1 11.5x13.5x0.1 N/A Measurements L x W x D (cm) 99.353 121.933 N/A A (cm) : rea 9.935 12.193 N/A Volume (cm) : -15.00% 20.40% N/A % Reduction in Area: HAMLET, AZOULAY ([id number]) [id number] Page 4 of 11 -15.00% 20.40% N/A % Reduction in Volume: Full Thickness Without Exposed Full Thickness Without Exposed N/A Classification: Support Structures Support Structures Medium Medium N/A Exudate A mount: Serosanguineous Serosanguineous N/A Exudate Type: red, brown red, brown N/A Exudate Color: N/A Thickened N/A Wound Margin: Small (1-33%) Small (1-33%) N/A Granulation A mount: Pink, Hyper-granulation Pink, Hyper-granulation N/A Granulation Quality: Large (67-100%) Large (67-100%) N/A Necrotic A mount: Eschar, Adherent Slough Eschar, Adherent Slough N/A Necrotic Tissue: Fat Layer (Subcutaneous Tissue): Yes Fat Layer (Subcutaneous Tissue): Yes N/A Exposed Structures: Fascia: No Fascia: No Tendon: No Tendon: No Muscle: No Muscle: No Joint: No Joint: No Bone: No Bone: No Small (1-33%) Small (1-33%) N/A Epithelialization: Debridement - Selective/Open Wound  Debridement - Selective/Open Wound N/A Debridement: Pre-procedure Verification/Time Out 13:40 13:40 N/A Taken: Lidocaine 5% topical ointment Lidocaine 5% topical ointment N/A Pain Control: [employer] N/A Tissue Debrided: Non-Viable Tissue Non-Viable Tissue N/A Level: 103.82 121.87 N/A Debridement A (sq cm): rea Curette Curette N/A Instrument: Minimum Minimum N/A Bleeding: Pressure Pressure N/A Hemostasis A chieved: Procedure was tolerated well Procedure was tolerated well N/A Debridement Treatment Response: 11.5x11.5x0.1 11.5x13.5x0.1 N/A Post Debridement Measurements L x W x D (cm) 10.387 12.193 N/A Post Debridement Volume: (cm) Scarring: Yes Scarring: Yes N/A Periwound Skin Texture: No Abnormalities Noted No Abnormalities Noted N/A Periwound Skin Moisture: Hemosiderin Staining: Yes Hemosiderin Staining: Yes N/A Periwound Skin Color: No Abnormality No Abnormality N/A Temperature: Compression Therapy Compression Therapy N/A Procedures Performed: Debridement Debridement Treatment Notes Wound #1 (Lower Leg) Wound Laterality: Right, Lateral Cleanser Soap and Water Discharge Instruction: May shower and wash wound with dial antibacterial soap and water prior to dressing change. Vashe 5.8 (oz) Discharge Instruction: Cleanse the wound with Vashe prior to applying a clean dressing using gauze sponges, not tissue or cotton balls. Wound Cleanser Discharge Instruction: Cleanse the wound with wound cleanser prior to applying a clean dressing using gauze sponges,

## 2023-04-28 NOTE — Progress Notes (Signed)
Peter, Terry ([id number]) [id number] Page 1 of 11 Visit Report for 04/28/2023 Chief Complaint Document Details Patient Name: Date of Service: Peter Terry RD V. 04/28/2023 1:00 PM Medical Record Number: [id number] Patient Account Number: [id number] Date of Birth/Sex: Treating RN: 12-07-1955 (67 y.o. M) Primary Care Provider: Herbie Drape Other Clinician: Referring Provider: Treating Provider/Extender: Verita Schneiders in Treatment: 1 Information Obtained from: Patient Chief Complaint Patient presents for treatment of open ulcers due to venous insufficiency in the setting of severe lymphedema Electronic Signature(s) Signed: 04/28/2023 2:34:22 PM By: Duanne Guess MD FACS Entered By: Duanne Guess on 04/28/2023 11:34:22 -------------------------------------------------------------------------------- Debridement Details Patient Name: Date of Service: Peter Terry RD V. 04/28/2023 1:00 PM Medical Record Number: [id number] Patient Account Number: [id number] Date of Birth/Sex: Treating RN: [birth year] (67 y.o. Peter Terry Primary Care Provider: Herbie Drape Other Clinician: Referring Provider: Treating Provider/Extender: Verita Schneiders in Treatment: 1 Debridement Performed for Assessment: Wound #2 Left,Lateral Lower Leg Performed By: Physician Duanne Guess, MD The following information was scribed by: Tommie Ard The information was scribed for: Duanne Guess Debridement Type: Debridement Level of Consciousness (Pre-procedure): Awake and Alert Pre-procedure Verification/Time Out Yes - 13:40 Taken: Start Time: 13:41 Pain Control: Lidocaine 5% topical ointment Percent of Wound Bed Debrided: 100% T Area Debrided (cm): otal 121.87 Tissue and other material debrided: Non-Viable, Slough, Slough Level: Non-Viable Tissue Debridement Description: Selective/Open Wound Instrument: Curette Bleeding:  Minimum Hemostasis Achieved: Pressure Response to Treatment: Procedure was tolerated well Level of Consciousness (Post- Awake and Alert procedure): Post Debridement Measurements of Total Wound Length: (cm) 11.5 Width: (cm) 13.5 Depth: (cm) 0.1 Volume: (cm) 12.193 Character of Wound/Ulcer Post Debridement: Requires Further Debridement Post Procedure Diagnosis PHELAN, KVAM ([id number]) [id number] Page 2 of 11 Same as Pre-procedure Electronic Signature(s) Signed: 04/28/2023 2:31:26 PM By: Tommie Ard RN Signed: 04/28/2023 4:02:03 PM By: Duanne Guess MD FACS Entered By: Tommie Ard on 04/28/2023 10:43:46 -------------------------------------------------------------------------------- Debridement Details Patient Name: Date of Service: Peter Terry RD V. 04/28/2023 1:00 PM Medical Record Number: [id number] Patient Account Number: [id number] Date of Birth/Sex: Treating RN: 07-03-1956 (67 y.o. Peter Terry Primary Care Provider: Herbie Drape Other Clinician: Referring Provider: Treating Provider/Extender: Verita Schneiders in Treatment: 1 Debridement Performed for Assessment: Wound #1 Right,Lateral Lower Leg Performed By: Physician Duanne Guess, MD The following information was scribed by: Tommie Ard The information was scribed for: Duanne Guess Debridement Type: Debridement Level of Consciousness (Pre-procedure): Awake and Alert Pre-procedure Verification/Time Out Yes - 13:40 Taken: Start Time: 13:41 Pain Control: Lidocaine 5% topical ointment Percent of Wound Bed Debrided: 100% T Area Debrided (cm): otal 103.82 Tissue and other material debrided: Non-Viable, Slough, Slough Level: Non-Viable Tissue Debridement Description: Selective/Open Wound Instrument: Curette Bleeding: Minimum Hemostasis Achieved: Pressure Response to Treatment: Procedure was tolerated well Level of Consciousness (Post- Awake and  Alert procedure): Post Debridement Measurements of Total Wound Length: (cm) 11.5 Width: (cm) 11.5 Depth: (cm) 0.1 Volume: (cm) 10.387 Character of Wound/Ulcer Post Debridement: Requires Further Debridement Post Procedure Diagnosis Same as Pre-procedure Electronic Signature(s) Signed: 04/28/2023 2:31:26 PM By: Tommie Ard RN Signed: 04/28/2023 4:02:03 PM By: Duanne Guess MD FACS Entered By: Tommie Ard on 04/28/2023 10:44:16 -------------------------------------------------------------------------------- HPI Details Patient Name: Date of Service: Peter Terry RD V. 04/28/2023 1:00 PM Medical Record Number: [id number] Patient Account Number: [id number] Date of Birth/Sex: Treating RN: Mar 01, 1956 (67 y.o. M) Primary Care Provider: Herbie Drape Other Clinician: Chandra Terry ([id number]) [id number] Page 3 of  11 Referring Provider: Treating Provider/Extender: Verita Schneiders in Treatment: 1 History of Present Illness HPI Description: ADMISSION 04/20/2023 ***ABIs L:0.85; R:0.96*** This is a 67 year old type II diabetic (last hemoglobin A1c 7.6%) with congestive heart failure, at least stage II lymphedema, peripheral neuropathy, CKD stage IV, and morbid obesity with a BMI of 49.12. He does not wear compression stockings as he is unable to reach his feet. Occasionally his sister helps him and applies some kind of compression wrap to his lower legs. He was referred to the wound care center by his primary care provider for further evaluation and management of his lymphedema skin changes that have resulted in tissue breakdown. 04/28/2023: No significant change to his wounds. There is slough accumulation and a strong odor coming from the sites. Edema control is abysmal. Electronic Signature(s) Signed: 04/28/2023 2:34:58 PM By: Duanne Guess MD FACS Entered By: Duanne Guess on 04/28/2023  11:34:57 -------------------------------------------------------------------------------- Physical Exam Details Patient Name: Date of Service: Peter Terry RD V. 04/28/2023 1:00 PM Medical Record Number: [id number] Patient Account Number: [id number] Date of Birth/Sex: Treating RN: 1956-06-02 (67 y.o. M) Primary Care Provider: Herbie Drape Other Clinician: Referring Provider: Treating Provider/Extender: Verita Schneiders in Treatment: 1 Constitutional Hypertensive, asymptomatic. . . . no acute distress. Respiratory Normal work of breathing on room air. Notes 04/28/2023: No significant change to his wounds. There is slough accumulation and a strong odor coming from the sites. Edema control is abysmal. Electronic Signature(s) Signed: 04/28/2023 2:35:31 PM By: Duanne Guess MD FACS Entered By: Duanne Guess on 04/28/2023 11:35:31 -------------------------------------------------------------------------------- Physician Orders Details Patient Name: Date of Service: Peter Terry RD V. 04/28/2023 1:00 PM Medical Record Number: [id number] Patient Account Number: [id number] Date of Birth/Sex: Treating RN: 08-24-1955 (67 y.o. Peter Terry Primary Care Provider: Herbie Drape Other Clinician: Referring Provider: Treating Provider/Extender: Verita Schneiders in Treatment: 1 Verbal / Phone Orders: No Diagnosis Coding ICD-10 Coding Code Description [phone] 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d L97.822 Non-pressure chronic ulcer of other part of left lower leg with fat layer exposed ZAYDN, MCCANTS ([id number]) [id number] Page 4 of 11 I89.0 Lymphedema, not elsewhere classified E11.622 Type 2 diabetes mellitus with other skin ulcer I50.22 Chronic systolic (congestive) heart failure N18.4 Chronic kidney disease, stage 4 (severe) E66.01 Morbid (severe) obesity due to excess  calories Follow-up Appointments ppointment in 1 week. - Dr. Lady Gary Room 3 Return A Anesthetic (In clinic) Topical Lidocaine 4% applied to wound bed Bathing/ Shower/ Hygiene May shower with protection but do not get wound dressing(s) wet. Protect dressing(s) with water repellant cover (for example, large plastic bag) or a cast cover and may then take shower. - Use cast protectors to keep the leg wraps wet when taking a shower or bathing. Keep leg wraps DRY! Edema Control - Lymphedema / SCD / Other Bilateral Lower Extremities Elevate legs to the level of the heart or above for 30 minutes daily and/or when sitting for 3-4 times a day throughout the day. Avoid standing for long periods of time. Other Edema Control Orders/Instructions: - will order Farrow compression garments Wound Treatment Wound #1 - Lower Leg Wound Laterality: Right, Lateral Cleanser: Soap and Water 1 x Per Week/30 Days Discharge Instructions: May shower and wash wound with dial antibacterial soap and water prior to dressing change. Cleanser: Vashe 5.8 (oz) 1 x Per Week/30 Days Discharge Instructions: Cleanse the wound with Vashe prior to applying a clean dressing  11 Referring Provider: Treating Provider/Extender: Verita Schneiders in Treatment: 1 History of Present Illness HPI Description: ADMISSION 04/20/2023 ***ABIs L:0.85; R:0.96*** This is a 67 year old type II diabetic (last hemoglobin A1c 7.6%) with congestive heart failure, at least stage II lymphedema, peripheral neuropathy, CKD stage IV, and morbid obesity with a BMI of 49.12. He does not wear compression stockings as he is unable to reach his feet. Occasionally his sister helps him and applies some kind of compression wrap to his lower legs. He was referred to the wound care center by his primary care provider for further evaluation and management of his lymphedema skin changes that have resulted in tissue breakdown. 04/28/2023: No significant change to his wounds. There is slough accumulation and a strong odor coming from the sites. Edema control is abysmal. Electronic Signature(s) Signed: 04/28/2023 2:34:58 PM By: Duanne Guess MD FACS Entered By: Duanne Guess on 04/28/2023  11:34:57 -------------------------------------------------------------------------------- Physical Exam Details Patient Name: Date of Service: Peter Terry RD V. 04/28/2023 1:00 PM Medical Record Number: [id number] Patient Account Number: [id number] Date of Birth/Sex: Treating RN: 1956-06-02 (67 y.o. M) Primary Care Provider: Herbie Drape Other Clinician: Referring Provider: Treating Provider/Extender: Verita Schneiders in Treatment: 1 Constitutional Hypertensive, asymptomatic. . . . no acute distress. Respiratory Normal work of breathing on room air. Notes 04/28/2023: No significant change to his wounds. There is slough accumulation and a strong odor coming from the sites. Edema control is abysmal. Electronic Signature(s) Signed: 04/28/2023 2:35:31 PM By: Duanne Guess MD FACS Entered By: Duanne Guess on 04/28/2023 11:35:31 -------------------------------------------------------------------------------- Physician Orders Details Patient Name: Date of Service: Peter Terry RD V. 04/28/2023 1:00 PM Medical Record Number: [id number] Patient Account Number: [id number] Date of Birth/Sex: Treating RN: 08-24-1955 (67 y.o. Peter Terry Primary Care Provider: Herbie Drape Other Clinician: Referring Provider: Treating Provider/Extender: Verita Schneiders in Treatment: 1 Verbal / Phone Orders: No Diagnosis Coding ICD-10 Coding Code Description [phone] 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d L97.822 Non-pressure chronic ulcer of other part of left lower leg with fat layer exposed ZAYDN, MCCANTS ([id number]) [id number] Page 4 of 11 I89.0 Lymphedema, not elsewhere classified E11.622 Type 2 diabetes mellitus with other skin ulcer I50.22 Chronic systolic (congestive) heart failure N18.4 Chronic kidney disease, stage 4 (severe) E66.01 Morbid (severe) obesity due to excess  calories Follow-up Appointments ppointment in 1 week. - Dr. Lady Gary Room 3 Return A Anesthetic (In clinic) Topical Lidocaine 4% applied to wound bed Bathing/ Shower/ Hygiene May shower with protection but do not get wound dressing(s) wet. Protect dressing(s) with water repellant cover (for example, large plastic bag) or a cast cover and may then take shower. - Use cast protectors to keep the leg wraps wet when taking a shower or bathing. Keep leg wraps DRY! Edema Control - Lymphedema / SCD / Other Bilateral Lower Extremities Elevate legs to the level of the heart or above for 30 minutes daily and/or when sitting for 3-4 times a day throughout the day. Avoid standing for long periods of time. Other Edema Control Orders/Instructions: - will order Farrow compression garments Wound Treatment Wound #1 - Lower Leg Wound Laterality: Right, Lateral Cleanser: Soap and Water 1 x Per Week/30 Days Discharge Instructions: May shower and wash wound with dial antibacterial soap and water prior to dressing change. Cleanser: Vashe 5.8 (oz) 1 x Per Week/30 Days Discharge Instructions: Cleanse the wound with Vashe prior to applying a clean dressing  11 Referring Provider: Treating Provider/Extender: Verita Schneiders in Treatment: 1 History of Present Illness HPI Description: ADMISSION 04/20/2023 ***ABIs L:0.85; R:0.96*** This is a 67 year old type II diabetic (last hemoglobin A1c 7.6%) with congestive heart failure, at least stage II lymphedema, peripheral neuropathy, CKD stage IV, and morbid obesity with a BMI of 49.12. He does not wear compression stockings as he is unable to reach his feet. Occasionally his sister helps him and applies some kind of compression wrap to his lower legs. He was referred to the wound care center by his primary care provider for further evaluation and management of his lymphedema skin changes that have resulted in tissue breakdown. 04/28/2023: No significant change to his wounds. There is slough accumulation and a strong odor coming from the sites. Edema control is abysmal. Electronic Signature(s) Signed: 04/28/2023 2:34:58 PM By: Duanne Guess MD FACS Entered By: Duanne Guess on 04/28/2023  11:34:57 -------------------------------------------------------------------------------- Physical Exam Details Patient Name: Date of Service: Peter Terry RD V. 04/28/2023 1:00 PM Medical Record Number: [id number] Patient Account Number: [id number] Date of Birth/Sex: Treating RN: 1956-06-02 (67 y.o. M) Primary Care Provider: Herbie Drape Other Clinician: Referring Provider: Treating Provider/Extender: Verita Schneiders in Treatment: 1 Constitutional Hypertensive, asymptomatic. . . . no acute distress. Respiratory Normal work of breathing on room air. Notes 04/28/2023: No significant change to his wounds. There is slough accumulation and a strong odor coming from the sites. Edema control is abysmal. Electronic Signature(s) Signed: 04/28/2023 2:35:31 PM By: Duanne Guess MD FACS Entered By: Duanne Guess on 04/28/2023 11:35:31 -------------------------------------------------------------------------------- Physician Orders Details Patient Name: Date of Service: Peter Terry RD V. 04/28/2023 1:00 PM Medical Record Number: [id number] Patient Account Number: [id number] Date of Birth/Sex: Treating RN: 08-24-1955 (67 y.o. Peter Terry Primary Care Provider: Herbie Drape Other Clinician: Referring Provider: Treating Provider/Extender: Verita Schneiders in Treatment: 1 Verbal / Phone Orders: No Diagnosis Coding ICD-10 Coding Code Description [phone] 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d L97.822 Non-pressure chronic ulcer of other part of left lower leg with fat layer exposed ZAYDN, MCCANTS ([id number]) [id number] Page 4 of 11 I89.0 Lymphedema, not elsewhere classified E11.622 Type 2 diabetes mellitus with other skin ulcer I50.22 Chronic systolic (congestive) heart failure N18.4 Chronic kidney disease, stage 4 (severe) E66.01 Morbid (severe) obesity due to excess  calories Follow-up Appointments ppointment in 1 week. - Dr. Lady Gary Room 3 Return A Anesthetic (In clinic) Topical Lidocaine 4% applied to wound bed Bathing/ Shower/ Hygiene May shower with protection but do not get wound dressing(s) wet. Protect dressing(s) with water repellant cover (for example, large plastic bag) or a cast cover and may then take shower. - Use cast protectors to keep the leg wraps wet when taking a shower or bathing. Keep leg wraps DRY! Edema Control - Lymphedema / SCD / Other Bilateral Lower Extremities Elevate legs to the level of the heart or above for 30 minutes daily and/or when sitting for 3-4 times a day throughout the day. Avoid standing for long periods of time. Other Edema Control Orders/Instructions: - will order Farrow compression garments Wound Treatment Wound #1 - Lower Leg Wound Laterality: Right, Lateral Cleanser: Soap and Water 1 x Per Week/30 Days Discharge Instructions: May shower and wash wound with dial antibacterial soap and water prior to dressing change. 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/05/2023 No Yes I89.0 Lymphedema, not elsewhere classified 04/20/2023 No Yes E11.622 Type 2 diabetes mellitus with other skin ulcer 04/20/2023 No Yes I50.22 Chronic systolic (congestive) heart failure 04/20/2023 No Yes N18.4 Chronic kidney disease, stage 4 (severe) 04/20/2023 No Yes KRAYTON, HORNEY ([id number])  [id number] Page 6 of 11 E66.01 Morbid (severe) obesity due to excess calories 04/20/2023 No Yes Inactive Problems Resolved Problems Electronic Signature(s) Signed: 04/28/2023 2:34:01 PM By: Duanne Guess MD FACS Entered By: Duanne Guess on 04/28/2023 11:34:01 -------------------------------------------------------------------------------- Progress Note Details Patient Name: Date of Service: Peter Terry RD V. 04/28/2023 1:00 PM Medical Record Number: [id number] Patient Account Number: [id number] Date of Birth/Sex: Treating RN: [birth year] (67 y.o. M) Primary Care Provider: Herbie Drape Other Clinician: Referring Provider: Treating Provider/Extender: Verita Schneiders in Treatment: 1 Subjective Chief Complaint Information obtained from Patient Patient presents for treatment of open ulcers due to venous insufficiency in the setting of severe lymphedema History of Present Illness (HPI) ADMISSION 04/20/2023 ***ABIs L:0.85; R:0.96*** This is a 67 year old type II diabetic (last hemoglobin A1c 7.6%) with congestive heart failure, at least stage II lymphedema, peripheral neuropathy, CKD stage IV, and morbid obesity with a BMI of 49.12. He does not wear compression stockings as he is unable to reach his feet. Occasionally his sister helps him and applies some kind of compression wrap to his lower legs. He was referred to the wound care center by his primary care provider for further evaluation and management of his lymphedema skin changes that have resulted in tissue breakdown. 04/28/2023: No significant change to his wounds. There is slough accumulation and a strong odor coming from the sites. Edema control is abysmal. Patient History Information obtained from Patient, Chart. Family History Unknown History. Social History Former smoker - ended on 08/11/2015, Marital Status - Single, Alcohol Use - Never, Drug Use - No History, Caffeine Use -  Daily. Medical History Hematologic/Lymphatic Patient has history of Anemia - iron deficiency Cardiovascular Patient has history of Congestive Heart Failure, Hypertension, 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 Endocrine Patient has history of Type II Diabetes Neurologic Patient has history of Neuropathy Hospitalization/Surgery History - Appendectomy. - Lumbar disc surgery. Medical A Surgical History Notes nd Constitutional Symptoms (General Health) obese Eyes retinal tear Gastrointestinal GERD Genitourinary CKD stage [address] EKAMJOT, BONIFACE ([id number]) [id number] Page 7 of 11 Objective Constitutional Hypertensive, asymptomatic. no acute distress. Vitals Time Taken: 1:10 PM, Height: 69 in, Weight: 310 lbs, BMI: 45.8, Temperature: 97.9 F, Pulse: 87 bpm, Respiratory Rate: 18 breaths/min, Blood Pressure: 195/93 mmHg, Capillary Blood Glucose: 119 mg/dl. Respiratory Normal work of breathing on room air. General Notes: 04/28/2023: No significant change to his wounds. There is slough accumulation and a strong odor coming from the sites. Edema control is abysmal. Integumentary (Hair, Skin) Wound #1 status is Open. Original cause of wound was Gradually Appeared. The date acquired was: 04/16/2022. The wound has been in treatment 1 weeks. The wound is located on the Right,Lateral Lower Leg. The wound measures 11cm length x 11.5cm width x 0.1cm depth; 99.353cm^2 area and 9.935cm^3 volume. There is Fat Layer (Subcutaneous Tissue) exposed. There is no tunneling or undermining noted. There is a medium amount of serosanguineous drainage noted. There is small (1-33%) pink, hyper - granulation within the wound bed. There is a large (67-100%) amount of necrotic tissue within the wound bed including Eschar and Adherent  with fat layer 04/20/2023 No Yes exposed L97.822 Non-pressure chronic ulcer of other part of left lower leg with fat layer exposed9/05/2023 No Yes I89.0 Lymphedema, not elsewhere classified 04/20/2023 No Yes E11.622 Type 2 diabetes mellitus with other skin ulcer 04/20/2023 No Yes I50.22 Chronic systolic (congestive) heart failure 04/20/2023 No Yes N18.4 Chronic kidney disease, stage 4 (severe) 04/20/2023 No Yes KRAYTON, HORNEY ([id number])  [id number] Page 6 of 11 E66.01 Morbid (severe) obesity due to excess calories 04/20/2023 No Yes Inactive Problems Resolved Problems Electronic Signature(s) Signed: 04/28/2023 2:34:01 PM By: Duanne Guess MD FACS Entered By: Duanne Guess on 04/28/2023 11:34:01 -------------------------------------------------------------------------------- Progress Note Details Patient Name: Date of Service: Peter Terry RD V. 04/28/2023 1:00 PM Medical Record Number: [id number] Patient Account Number: [id number] Date of Birth/Sex: Treating RN: [birth year] (67 y.o. M) Primary Care Provider: Herbie Drape Other Clinician: Referring Provider: Treating Provider/Extender: Verita Schneiders in Treatment: 1 Subjective Chief Complaint Information obtained from Patient Patient presents for treatment of open ulcers due to venous insufficiency in the setting of severe lymphedema History of Present Illness (HPI) ADMISSION 04/20/2023 ***ABIs L:0.85; R:0.96*** This is a 67 year old type II diabetic (last hemoglobin A1c 7.6%) with congestive heart failure, at least stage II lymphedema, peripheral neuropathy, CKD stage IV, and morbid obesity with a BMI of 49.12. He does not wear compression stockings as he is unable to reach his feet. Occasionally his sister helps him and applies some kind of compression wrap to his lower legs. He was referred to the wound care center by his primary care provider for further evaluation and management of his lymphedema skin changes that have resulted in tissue breakdown. 04/28/2023: No significant change to his wounds. There is slough accumulation and a strong odor coming from the sites. Edema control is abysmal. Patient History Information obtained from Patient, Chart. Family History Unknown History. Social History Former smoker - ended on 08/11/2015, Marital Status - Single, Alcohol Use - Never, Drug Use - No History, Caffeine Use -  Daily. Medical History Hematologic/Lymphatic Patient has history of Anemia - iron deficiency Cardiovascular Patient has history of Congestive Heart Failure, Hypertension, 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 Endocrine Patient has history of Type II Diabetes Neurologic Patient has history of Neuropathy Hospitalization/Surgery History - Appendectomy. - Lumbar disc surgery. Medical A Surgical History Notes nd Constitutional Symptoms (General Health) obese Eyes retinal tear Gastrointestinal GERD Genitourinary CKD stage [address] EKAMJOT, BONIFACE ([id number]) [id number] Page 7 of 11 Objective Constitutional Hypertensive, asymptomatic. no acute distress. Vitals Time Taken: 1:10 PM, Height: 69 in, Weight: 310 lbs, BMI: 45.8, Temperature: 97.9 F, Pulse: 87 bpm, Respiratory Rate: 18 breaths/min, Blood Pressure: 195/93 mmHg, Capillary Blood Glucose: 119 mg/dl. Respiratory Normal work of breathing on room air. General Notes: 04/28/2023: No significant change to his wounds. There is slough accumulation and a strong odor coming from the sites. Edema control is abysmal. Integumentary (Hair, Skin) Wound #1 status is Open. Original cause of wound was Gradually Appeared. The date acquired was: 04/16/2022. The wound has been in treatment 1 weeks. The wound is located on the Right,Lateral Lower Leg. The wound measures 11cm length x 11.5cm width x 0.1cm depth; 99.353cm^2 area and 9.935cm^3 volume. There is Fat Layer (Subcutaneous Tissue) exposed. There is no tunneling or undermining noted. There is a medium amount of serosanguineous drainage noted. There is small (1-33%) pink, hyper - granulation within the wound bed. There is a large (67-100%) amount of necrotic tissue within the wound bed including Eschar and Adherent  11 Referring Provider: Treating Provider/Extender: Verita Schneiders in Treatment: 1 History of Present Illness HPI Description: ADMISSION 04/20/2023 ***ABIs L:0.85; R:0.96*** This is a 67 year old type II diabetic (last hemoglobin A1c 7.6%) with congestive heart failure, at least stage II lymphedema, peripheral neuropathy, CKD stage IV, and morbid obesity with a BMI of 49.12. He does not wear compression stockings as he is unable to reach his feet. Occasionally his sister helps him and applies some kind of compression wrap to his lower legs. He was referred to the wound care center by his primary care provider for further evaluation and management of his lymphedema skin changes that have resulted in tissue breakdown. 04/28/2023: No significant change to his wounds. There is slough accumulation and a strong odor coming from the sites. Edema control is abysmal. Electronic Signature(s) Signed: 04/28/2023 2:34:58 PM By: Duanne Guess MD FACS Entered By: Duanne Guess on 04/28/2023  11:34:57 -------------------------------------------------------------------------------- Physical Exam Details Patient Name: Date of Service: Peter Terry RD V. 04/28/2023 1:00 PM Medical Record Number: [id number] Patient Account Number: [id number] Date of Birth/Sex: Treating RN: 1956-06-02 (67 y.o. M) Primary Care Provider: Herbie Drape Other Clinician: Referring Provider: Treating Provider/Extender: Verita Schneiders in Treatment: 1 Constitutional Hypertensive, asymptomatic. . . . no acute distress. Respiratory Normal work of breathing on room air. Notes 04/28/2023: No significant change to his wounds. There is slough accumulation and a strong odor coming from the sites. Edema control is abysmal. Electronic Signature(s) Signed: 04/28/2023 2:35:31 PM By: Duanne Guess MD FACS Entered By: Duanne Guess on 04/28/2023 11:35:31 -------------------------------------------------------------------------------- Physician Orders Details Patient Name: Date of Service: Peter Terry RD V. 04/28/2023 1:00 PM Medical Record Number: [id number] Patient Account Number: [id number] Date of Birth/Sex: Treating RN: 08-24-1955 (67 y.o. Peter Terry Primary Care Provider: Herbie Drape Other Clinician: Referring Provider: Treating Provider/Extender: Verita Schneiders in Treatment: 1 Verbal / Phone Orders: No Diagnosis Coding ICD-10 Coding Code Description [phone] 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d L97.822 Non-pressure chronic ulcer of other part of left lower leg with fat layer exposed ZAYDN, MCCANTS ([id number]) [id number] Page 4 of 11 I89.0 Lymphedema, not elsewhere classified E11.622 Type 2 diabetes mellitus with other skin ulcer I50.22 Chronic systolic (congestive) heart failure N18.4 Chronic kidney disease, stage 4 (severe) E66.01 Morbid (severe) obesity due to excess  calories Follow-up Appointments ppointment in 1 week. - Dr. Lady Gary Room 3 Return A Anesthetic (In clinic) Topical Lidocaine 4% applied to wound bed Bathing/ Shower/ Hygiene May shower with protection but do not get wound dressing(s) wet. Protect dressing(s) with water repellant cover (for example, large plastic bag) or a cast cover and may then take shower. - Use cast protectors to keep the leg wraps wet when taking a shower or bathing. Keep leg wraps DRY! Edema Control - Lymphedema / SCD / Other Bilateral Lower Extremities Elevate legs to the level of the heart or above for 30 minutes daily and/or when sitting for 3-4 times a day throughout the day. Avoid standing for long periods of time. Other Edema Control Orders/Instructions: - will order [place] compression garments Wound Treatment Wound #1 - Lower Leg Wound Laterality: Right, Lateral Cleanser: Soap and Water 1 x Per Week/30 Days Discharge Instructions: May shower and wash wound with dial antibacterial soap and water prior to dressing change. 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/05/2023 No Yes I89.0 Lymphedema, not elsewhere classified 04/20/2023 No Yes E11.622 Type 2 diabetes mellitus with other skin ulcer 04/20/2023 No Yes I50.22 Chronic systolic (congestive) heart failure 04/20/2023 No Yes N18.4 Chronic kidney disease, stage 4 (severe) 04/20/2023 No Yes KRAYTON, HORNEY ([id number])  [id number] Page 6 of 11 E66.01 Morbid (severe) obesity due to excess calories 04/20/2023 No Yes Inactive Problems Resolved Problems Electronic Signature(s) Signed: 04/28/2023 2:34:01 PM By: Duanne Guess MD FACS Entered By: Duanne Guess on 04/28/2023 11:34:01 -------------------------------------------------------------------------------- Progress Note Details Patient Name: Date of Service: Peter Terry RD V. 04/28/2023 1:00 PM Medical Record Number: [id number] Patient Account Number: [id number] Date of Birth/Sex: Treating RN: [birth year] (67 y.o. M) Primary Care Provider: Herbie Drape Other Clinician: Referring Provider: Treating Provider/Extender: Verita Schneiders in Treatment: 1 Subjective Chief Complaint Information obtained from Patient Patient presents for treatment of open ulcers due to venous insufficiency in the setting of severe lymphedema History of Present Illness (HPI) ADMISSION 04/20/2023 ***ABIs L:0.85; R:0.96*** This is a 67 year old type II diabetic (last hemoglobin A1c 7.6%) with congestive heart failure, at least stage II lymphedema, peripheral neuropathy, CKD stage IV, and morbid obesity with a BMI of 49.12. He does not wear compression stockings as he is unable to reach his feet. Occasionally his sister helps him and applies some kind of compression wrap to his lower legs. He was referred to the wound care center by his primary care provider for further evaluation and management of his lymphedema skin changes that have resulted in tissue breakdown. 04/28/2023: No significant change to his wounds. There is slough accumulation and a strong odor coming from the sites. Edema control is abysmal. Patient History Information obtained from Patient, Chart. Family History Unknown History. Social History Former smoker - ended on 08/11/2015, Marital Status - Single, Alcohol Use - Never, Drug Use - No History, Caffeine Use -  Daily. Medical History Hematologic/Lymphatic Patient has history of Anemia - iron deficiency Cardiovascular Patient has history of Congestive Heart Failure, Hypertension, 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 Endocrine Patient has history of Type II Diabetes Neurologic Patient has history of Neuropathy Hospitalization/Surgery History - Appendectomy. - Lumbar disc surgery. Medical A Surgical History Notes nd Constitutional Symptoms (General Health) obese Eyes retinal tear Gastrointestinal GERD Genitourinary CKD stage [address] EKAMJOT, BONIFACE ([id number]) [id number] Page 7 of 11 Objective Constitutional Hypertensive, asymptomatic. no acute distress. Vitals Time Taken: 1:10 PM, Height: 69 in, Weight: 310 lbs, BMI: 45.8, Temperature: 97.9 F, Pulse: 87 bpm, Respiratory Rate: 18 breaths/min, Blood Pressure: 195/93 mmHg, Capillary Blood Glucose: 119 mg/dl. Respiratory Normal work of breathing on room air. General Notes: 04/28/2023: No significant change to his wounds. There is slough accumulation and a strong odor coming from the sites. Edema control is abysmal. Integumentary (Hair, Skin) Wound #1 status is Open. Original cause of wound was Gradually Appeared. The date acquired was: 04/16/2022. The wound has been in treatment 1 weeks. The wound is located on the Right,Lateral Lower Leg. The wound measures 11cm length x 11.5cm width x 0.1cm depth; 99.353cm^2 area and 9.935cm^3 volume. There is Fat Layer (Subcutaneous Tissue) exposed. There is no tunneling or undermining noted. There is a medium amount of serosanguineous drainage noted. There is small (1-33%) pink, hyper - granulation within the wound bed. There is a large (67-100%) amount of necrotic tissue within the wound bed including Eschar and Adherent

## 2023-04-29 DIAGNOSIS — E1165 Type 2 diabetes mellitus with hyperglycemia: Secondary | ICD-10-CM | POA: Diagnosis not present

## 2023-04-29 NOTE — Telephone Encounter (Signed)
Lft VM to rtn call. Dm/cma

## 2023-04-30 NOTE — Telephone Encounter (Signed)
Lft VM to rtn call. Dm/cma

## 2023-05-02 DIAGNOSIS — I358 Other nonrheumatic aortic valve disorders: Secondary | ICD-10-CM | POA: Diagnosis not present

## 2023-05-02 DIAGNOSIS — E782 Mixed hyperlipidemia: Secondary | ICD-10-CM | POA: Diagnosis not present

## 2023-05-02 DIAGNOSIS — R079 Chest pain, unspecified: Secondary | ICD-10-CM | POA: Diagnosis not present

## 2023-05-02 DIAGNOSIS — R0602 Shortness of breath: Secondary | ICD-10-CM | POA: Diagnosis not present

## 2023-05-02 DIAGNOSIS — Z87891 Personal history of nicotine dependence: Secondary | ICD-10-CM | POA: Diagnosis not present

## 2023-05-02 DIAGNOSIS — N184 Chronic kidney disease, stage 4 (severe): Secondary | ICD-10-CM | POA: Diagnosis not present

## 2023-05-02 DIAGNOSIS — N189 Chronic kidney disease, unspecified: Secondary | ICD-10-CM | POA: Diagnosis not present

## 2023-05-02 DIAGNOSIS — I2729 Other secondary pulmonary hypertension: Secondary | ICD-10-CM | POA: Diagnosis not present

## 2023-05-02 DIAGNOSIS — Z6841 Body Mass Index (BMI) 40.0 and over, adult: Secondary | ICD-10-CM | POA: Diagnosis not present

## 2023-05-02 DIAGNOSIS — E1122 Type 2 diabetes mellitus with diabetic chronic kidney disease: Secondary | ICD-10-CM | POA: Diagnosis not present

## 2023-05-02 DIAGNOSIS — Z91199 Patient's noncompliance with other medical treatment and regimen due to unspecified reason: Secondary | ICD-10-CM | POA: Diagnosis not present

## 2023-05-02 DIAGNOSIS — N1832 Chronic kidney disease, stage 3b: Secondary | ICD-10-CM | POA: Diagnosis not present

## 2023-05-02 DIAGNOSIS — N179 Acute kidney failure, unspecified: Secondary | ICD-10-CM | POA: Diagnosis not present

## 2023-05-02 DIAGNOSIS — I5033 Acute on chronic diastolic (congestive) heart failure: Secondary | ICD-10-CM | POA: Diagnosis not present

## 2023-05-02 DIAGNOSIS — I517 Cardiomegaly: Secondary | ICD-10-CM | POA: Diagnosis not present

## 2023-05-02 DIAGNOSIS — I771 Stricture of artery: Secondary | ICD-10-CM | POA: Diagnosis not present

## 2023-05-02 DIAGNOSIS — D631 Anemia in chronic kidney disease: Secondary | ICD-10-CM | POA: Diagnosis not present

## 2023-05-02 DIAGNOSIS — I130 Hypertensive heart and chronic kidney disease with heart failure and stage 1 through stage 4 chronic kidney disease, or unspecified chronic kidney disease: Secondary | ICD-10-CM | POA: Diagnosis not present

## 2023-05-02 DIAGNOSIS — S81809S Unspecified open wound, unspecified lower leg, sequela: Secondary | ICD-10-CM | POA: Diagnosis not present

## 2023-05-02 DIAGNOSIS — I871 Compression of vein: Secondary | ICD-10-CM | POA: Diagnosis not present

## 2023-05-02 DIAGNOSIS — I872 Venous insufficiency (chronic) (peripheral): Secondary | ICD-10-CM | POA: Diagnosis not present

## 2023-05-02 DIAGNOSIS — R0609 Other forms of dyspnea: Secondary | ICD-10-CM

## 2023-05-02 DIAGNOSIS — I509 Heart failure, unspecified: Secondary | ICD-10-CM | POA: Diagnosis not present

## 2023-05-02 DIAGNOSIS — J449 Chronic obstructive pulmonary disease, unspecified: Secondary | ICD-10-CM | POA: Diagnosis not present

## 2023-05-02 DIAGNOSIS — I5031 Acute diastolic (congestive) heart failure: Secondary | ICD-10-CM

## 2023-05-02 DIAGNOSIS — Z7984 Long term (current) use of oral hypoglycemic drugs: Secondary | ICD-10-CM | POA: Diagnosis not present

## 2023-05-02 DIAGNOSIS — Z79899 Other long term (current) drug therapy: Secondary | ICD-10-CM | POA: Diagnosis not present

## 2023-05-02 DIAGNOSIS — S81809A Unspecified open wound, unspecified lower leg, initial encounter: Secondary | ICD-10-CM

## 2023-05-02 DIAGNOSIS — R600 Localized edema: Secondary | ICD-10-CM | POA: Diagnosis not present

## 2023-05-02 DIAGNOSIS — N183 Chronic kidney disease, stage 3 unspecified: Secondary | ICD-10-CM | POA: Diagnosis not present

## 2023-05-02 DIAGNOSIS — Z794 Long term (current) use of insulin: Secondary | ICD-10-CM | POA: Diagnosis not present

## 2023-05-02 DIAGNOSIS — G4733 Obstructive sleep apnea (adult) (pediatric): Secondary | ICD-10-CM | POA: Diagnosis not present

## 2023-05-02 DIAGNOSIS — E1165 Type 2 diabetes mellitus with hyperglycemia: Secondary | ICD-10-CM

## 2023-05-02 HISTORY — DX: Unspecified open wound, unspecified lower leg, initial encounter: S81.809A

## 2023-05-02 HISTORY — DX: Other forms of dyspnea: R06.09

## 2023-05-02 HISTORY — DX: Long term (current) use of insulin: E11.65

## 2023-05-02 HISTORY — DX: Acute diastolic (congestive) heart failure: I50.31

## 2023-05-03 DIAGNOSIS — I509 Heart failure, unspecified: Secondary | ICD-10-CM | POA: Diagnosis not present

## 2023-05-04 ENCOUNTER — Encounter (HOSPITAL_BASED_OUTPATIENT_CLINIC_OR_DEPARTMENT_OTHER): Payer: Medicare HMO | Admitting: Internal Medicine

## 2023-05-04 DIAGNOSIS — I509 Heart failure, unspecified: Secondary | ICD-10-CM | POA: Diagnosis not present

## 2023-05-05 DIAGNOSIS — I509 Heart failure, unspecified: Secondary | ICD-10-CM | POA: Diagnosis not present

## 2023-05-06 DIAGNOSIS — I509 Heart failure, unspecified: Secondary | ICD-10-CM | POA: Diagnosis not present

## 2023-05-07 ENCOUNTER — Ambulatory Visit (HOSPITAL_BASED_OUTPATIENT_CLINIC_OR_DEPARTMENT_OTHER): Payer: Medicare HMO | Admitting: General Surgery

## 2023-05-07 ENCOUNTER — Other Ambulatory Visit: Payer: Self-pay | Admitting: *Deleted

## 2023-05-07 DIAGNOSIS — I872 Venous insufficiency (chronic) (peripheral): Secondary | ICD-10-CM

## 2023-05-10 ENCOUNTER — Other Ambulatory Visit: Payer: Self-pay

## 2023-05-10 DIAGNOSIS — E7800 Pure hypercholesterolemia, unspecified: Secondary | ICD-10-CM | POA: Insufficient documentation

## 2023-05-10 DIAGNOSIS — I10 Essential (primary) hypertension: Secondary | ICD-10-CM | POA: Insufficient documentation

## 2023-05-12 ENCOUNTER — Encounter: Payer: Self-pay | Admitting: Family Medicine

## 2023-05-12 ENCOUNTER — Ambulatory Visit: Payer: Medicare HMO | Admitting: Family Medicine

## 2023-05-12 ENCOUNTER — Encounter (HOSPITAL_BASED_OUTPATIENT_CLINIC_OR_DEPARTMENT_OTHER): Payer: Medicare HMO | Attending: General Surgery | Admitting: General Surgery

## 2023-05-12 VITALS — BP 138/68 | HR 85 | Temp 98.8°F | Ht 69.0 in | Wt 315.4 lb

## 2023-05-12 DIAGNOSIS — Z23 Encounter for immunization: Secondary | ICD-10-CM

## 2023-05-12 DIAGNOSIS — N184 Chronic kidney disease, stage 4 (severe): Secondary | ICD-10-CM | POA: Diagnosis not present

## 2023-05-12 DIAGNOSIS — L97812 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d: Secondary | ICD-10-CM | POA: Diagnosis not present

## 2023-05-12 DIAGNOSIS — I5022 Chronic systolic (congestive) heart failure: Secondary | ICD-10-CM | POA: Insufficient documentation

## 2023-05-12 DIAGNOSIS — D509 Iron deficiency anemia, unspecified: Secondary | ICD-10-CM | POA: Diagnosis not present

## 2023-05-12 DIAGNOSIS — Z6841 Body Mass Index (BMI) 40.0 and over, adult: Secondary | ICD-10-CM | POA: Insufficient documentation

## 2023-05-12 DIAGNOSIS — E11622 Type 2 diabetes mellitus with other skin ulcer: Secondary | ICD-10-CM | POA: Insufficient documentation

## 2023-05-12 DIAGNOSIS — Z794 Long term (current) use of insulin: Secondary | ICD-10-CM

## 2023-05-12 DIAGNOSIS — Z7984 Long term (current) use of oral hypoglycemic drugs: Secondary | ICD-10-CM

## 2023-05-12 DIAGNOSIS — S81809D Unspecified open wound, unspecified lower leg, subsequent encounter: Secondary | ICD-10-CM | POA: Diagnosis not present

## 2023-05-12 DIAGNOSIS — L97822 Non-pressure chronic ulcer of other part of left lower leg with fat layer exposed: Secondary | ICD-10-CM | POA: Diagnosis not present

## 2023-05-12 DIAGNOSIS — I890 Lymphedema, not elsewhere classified: Secondary | ICD-10-CM | POA: Insufficient documentation

## 2023-05-12 DIAGNOSIS — E1122 Type 2 diabetes mellitus with diabetic chronic kidney disease: Secondary | ICD-10-CM | POA: Insufficient documentation

## 2023-05-12 DIAGNOSIS — I10 Essential (primary) hypertension: Secondary | ICD-10-CM | POA: Diagnosis not present

## 2023-05-12 DIAGNOSIS — Z87891 Personal history of nicotine dependence: Secondary | ICD-10-CM | POA: Insufficient documentation

## 2023-05-12 DIAGNOSIS — I130 Hypertensive heart and chronic kidney disease with heart failure and stage 1 through stage 4 chronic kidney disease, or unspecified chronic kidney disease: Secondary | ICD-10-CM | POA: Insufficient documentation

## 2023-05-12 MED ORDER — FERROUS SULFATE 325 (65 FE) MG PO TBEC
325.0000 mg | DELAYED_RELEASE_TABLET | Freq: Three times a day (TID) | ORAL | 3 refills | Status: DC
Start: 2023-05-12 — End: 2023-09-03

## 2023-05-12 MED ORDER — INSULIN LISPRO PROT & LISPRO (75-25 MIX) 100 UNIT/ML KWIKPEN
35.0000 [IU] | PEN_INJECTOR | Freq: Two times a day (BID) | SUBCUTANEOUS | Status: DC
Start: 2023-05-12 — End: 2023-07-13

## 2023-05-12 MED ORDER — TORSEMIDE 20 MG PO TABS
40.0000 mg | ORAL_TABLET | Freq: Two times a day (BID) | ORAL | Status: DC
Start: 2023-05-12 — End: 2023-05-31

## 2023-05-12 NOTE — Assessment & Plan Note (Signed)
I will recheck renal labs today. I will ask the referral team to assist him with a follow-up to see Dr. Janit Pagan (nephrology).

## 2023-05-12 NOTE — Progress Notes (Addendum)
to his diuretic regimen and keeping his legs elevated is much as possible. He was instructed to wear his Fabian November wraps religiously on a daily basis. We will discharge him from the wound care center. He may follow-up as needed. Electronic Signature(s) Signed: 05/12/2023 2:18:23 PM By: Duanne Guess MD FACS Previous Signature: 05/12/2023 2:13:01 PM Version By: Duanne Guess MD FACS Entered By: Duanne Guess on 05/12/2023 14:18:23 -------------------------------------------------------------------------------- HxROS Details Patient Name: Date of Service: Peter Molt RD V. 05/12/2023 12:45 PM Medical Record Number: [id number] Patient Account Number: [id number] Date of Birth/Sex: Treating RN: 1955/11/14 (67 y.o. 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 Gastrointestinal Medical History: Past Medical History Notes: GERD Endocrine Medical History: Positive for: Type II Diabetes Time with diabetes: 10 years Treated with: Insulin, Oral agents Blood sugar testing results: Breakfast: 234 Genitourinary Medical History: Past Medical History Notes: CKD stage 4 Neurologic Medical History: Positive for: Neuropathy Immunizations Pneumococcal Vaccine: Received Pneumococcal Vaccination: No Implantable Devices None Hospitalization / Surgery History Type of Hospitalization/Surgery Appendectomy Lumbar disc surgery Family and Social History Unknown History: Yes; Former smoker - ended on 08/11/2015; Marital Status - Single; Alcohol Use: Never; Drug Use: No History; Caffeine Use: Daily; Financial Concerns: No; Food, Clothing or Shelter Needs: No; Support System Lacking: No; Transportation Concerns: No Psychologist, prison and probation services) Signed: 05/12/2023 4:32:07 PM By: Duanne Guess MD FACS Entered By: Duanne Guess on 05/12/2023 14:11:43 -------------------------------------------------------------------------------- SuperBill Details Patient Name: Date of Service: Peter Molt RD V. 05/12/2023 Medical Record Number: [id number] Patient Account Number: [id number] Date of Birth/Sex: Treating RN: March 04, 1956 (67 y.o. M) Primary Care Provider: Herbie Drape Other Clinician: Referring Provider: Treating Provider/Extender: Verita Schneiders in Treatment: [address], [place] V ([id number]) [id number] Page 7 of 7 Diagnosis Coding ICD-10  Codes Code Description [phone] 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d L97.822 Non-pressure chronic ulcer of other part of left lower leg with fat layer exposed I89.0 Lymphedema, not elsewhere classified E11.622 Type 2 diabetes mellitus with other skin ulcer I50.22 Chronic systolic (congestive) heart failure N18.4 Chronic kidney disease, stage 4 (severe) E66.01 Morbid (severe) obesity due to excess calories Facility Procedures : CPT4 Code: 18841660 Description: 63016 - WOUND CARE VISIT-LEV 2 EST PT Modifier: Quantity: 1 Physician Procedures : CPT4 Code Description Modifier [id number] - WC PHYS LEVEL 3 - EST PT ICD-10 Diagnosis Description L97.812 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d L97.822 Non-pressure chronic ulcer of other part of left  lower leg with fat layer exposed I89.0 Lymphedema, not elsewhere classified E11.622 Type 2 diabetes mellitus with other skin ulcer Quantity: 1 Electronic Signature(s) Signed: 05/17/2023 1:06:58 PM By: Pearletha Alfred Signed: 05/17/2023 1:55:07 PM By: Duanne Guess MD FACS Previous Signature: 05/12/2023 2:18:41 PM Version By: Duanne Guess MD FACS Entered By: Pearletha Alfred on 05/17/2023 13:06:57  ADLER, CHARTRAND ([id number]) [id number] Page 1 of 7 Visit Report for 05/12/2023 Chief Complaint Document Details Patient Name: Date of Service: Peter Molt RD V. 05/12/2023 12:45 PM Medical Record Number: [id number] Patient Account Number: [id number] Date of Birth/Sex: Treating RN: 08-10-1956 (67 y.o. M) Primary Care Provider: Herbie Drape Other Clinician: Referring Provider: Treating Provider/Extender: Verita Schneiders in Treatment: 3 Information Obtained from: Patient Chief Complaint Patient presents for treatment of open ulcers due to venous insufficiency in the setting of severe lymphedema Electronic Signature(s) Signed: 05/12/2023 2:10:52 PM By: Duanne Guess MD FACS Entered By: Duanne Guess on 05/12/2023 14:10:52 -------------------------------------------------------------------------------- HPI Details Patient Name: Date of Service: Peter Molt RD V. 05/12/2023 12:45 PM Medical Record Number: [id number] Patient Account Number: [id number] Date of Birth/Sex: Treating RN: [birth year] (67 y.o. M) Primary Care Provider: Herbie Drape Other Clinician: Referring Provider: Treating Provider/Extender: Verita Schneiders in Treatment: 3 History of Present Illness HPI Description: ADMISSION 04/20/2023 ***ABIs L:0.85; R:0.96*** This is a 67 year old type II diabetic (last hemoglobin A1c 7.6%) with congestive heart failure, at least stage II lymphedema, peripheral neuropathy, CKD stage IV, and morbid obesity with a BMI of 49.12. He does not wear compression stockings as he is unable to reach his feet. Occasionally his sister helps him and applies some kind of compression wrap to his lower legs. He was referred to the wound care center by his primary care provider for further evaluation and management of his lymphedema skin changes that have resulted in tissue breakdown. 04/28/2023: No significant change to his wounds.  There is slough accumulation and a strong odor coming from the sites. Edema control is abysmal. 05/12/2023: Since his last visit, he was hospitalized at [facility] for a CHF exacerbation. His diuretics were adjusted. Today his edema control is good and his wounds are healed. He does have his [place] wraps with him today. Electronic Signature(s) Signed: 05/12/2023 2:11:33 PM By: Duanne Guess MD FACS Entered By: Duanne Guess on 05/12/2023 14:11:33 Physical Exam Details -------------------------------------------------------------------------------- Chandra Batch ([id number]) [id number] Page 2 of 7 Patient Name: Date of Service: Peter Molt RD V. 05/12/2023 12:45 PM Medical Record Number: [id number] Patient Account Number: [id number] Date of Birth/Sex: Treating RN: 13-Dec-1955 (67 y.o. M) Primary Care Provider: Herbie Drape Other Clinician: Referring Provider: Treating Provider/Extender: Verita Schneiders in Treatment: 3 Constitutional Hypertensive, asymptomatic. . . . no acute distress. Respiratory Normal work of breathing on room air. Notes 05/12/2023: Today his edema control is good and his wounds are healed. Electronic Signature(s) Signed: 05/12/2023 2:12:40 PM By: Duanne Guess MD FACS Entered By: Duanne Guess on 05/12/2023 14:12:39 -------------------------------------------------------------------------------- Physician Orders Details Patient Name: Date of Service: Peter Molt RD V. 05/12/2023 12:45 PM Medical Record Number: [id number] Patient Account Number: [id number] Date of Birth/Sex: Treating RN: 07-Aug-1956 (67 y.o. [name] Primary Care Provider: Herbie Drape Other Clinician: Referring Provider: Treating Provider/Extender: Verita Schneiders in Treatment: 3 The following information was scribed by: Tommie Ard The information was scribed for: Duanne Guess Verbal / Phone Orders:  No Diagnosis Coding ICD-10 Coding Code Description [id number] 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d L97.822 Non-pressure chronic ulcer of other part of left lower leg with fat layer exposed I89.0 Lymphedema, not elsewhere classified E11.622 Type 2 diabetes mellitus with other skin ulcer I50.22 Chronic systolic (congestive) heart failure N18.4 Chronic kidney disease, stage 4 (severe) E66.01 Morbid (severe) obesity due to excess calories Discharge  to his diuretic regimen and keeping his legs elevated is much as possible. He was instructed to wear his Fabian November wraps religiously on a daily basis. We will discharge him from the wound care center. He may follow-up as needed. Electronic Signature(s) Signed: 05/12/2023 2:18:23 PM By: Duanne Guess MD FACS Previous Signature: 05/12/2023 2:13:01 PM Version By: Duanne Guess MD FACS Entered By: Duanne Guess on 05/12/2023 14:18:23 -------------------------------------------------------------------------------- HxROS Details Patient Name: Date of Service: Peter Molt RD V. 05/12/2023 12:45 PM Medical Record Number: [id number] Patient Account Number: [id number] Date of Birth/Sex: Treating RN: 1955/11/14 (67 y.o. 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 Gastrointestinal Medical History: Past Medical History Notes: GERD Endocrine Medical History: Positive for: Type II Diabetes Time with diabetes: 10 years Treated with: Insulin, Oral agents Blood sugar testing results: Breakfast: 234 Genitourinary Medical History: Past Medical History Notes: CKD stage 4 Neurologic Medical History: Positive for: Neuropathy Immunizations Pneumococcal Vaccine: Received Pneumococcal Vaccination: No Implantable Devices None Hospitalization / Surgery History Type of Hospitalization/Surgery Appendectomy Lumbar disc surgery Family and Social History Unknown History: Yes; Former smoker - ended on 08/11/2015; Marital Status - Single; Alcohol Use: Never; Drug Use: No History; Caffeine Use: Daily; Financial Concerns: No; Food, Clothing or Shelter Needs: No; Support System Lacking: No; Transportation Concerns: No Psychologist, prison and probation services) Signed: 05/12/2023 4:32:07 PM By: Duanne Guess MD FACS Entered By: Duanne Guess on 05/12/2023 14:11:43 -------------------------------------------------------------------------------- SuperBill Details Patient Name: Date of Service: Peter Molt RD V. 05/12/2023 Medical Record Number: [id number] Patient Account Number: [id number] Date of Birth/Sex: Treating RN: March 04, 1956 (67 y.o. M) Primary Care Provider: Herbie Drape Other Clinician: Referring Provider: Treating Provider/Extender: Verita Schneiders in Treatment: [address], [place] V ([id number]) [id number] Page 7 of 7 Diagnosis Coding ICD-10  Codes Code Description [phone] 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d L97.822 Non-pressure chronic ulcer of other part of left lower leg with fat layer exposed I89.0 Lymphedema, not elsewhere classified E11.622 Type 2 diabetes mellitus with other skin ulcer I50.22 Chronic systolic (congestive) heart failure N18.4 Chronic kidney disease, stage 4 (severe) E66.01 Morbid (severe) obesity due to excess calories Facility Procedures : CPT4 Code: 18841660 Description: 63016 - WOUND CARE VISIT-LEV 2 EST PT Modifier: Quantity: 1 Physician Procedures : CPT4 Code Description Modifier [id number] - WC PHYS LEVEL 3 - EST PT ICD-10 Diagnosis Description L97.812 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d L97.822 Non-pressure chronic ulcer of other part of left  lower leg with fat layer exposed I89.0 Lymphedema, not elsewhere classified E11.622 Type 2 diabetes mellitus with other skin ulcer Quantity: 1 Electronic Signature(s) Signed: 05/17/2023 1:06:58 PM By: Pearletha Alfred Signed: 05/17/2023 1:55:07 PM By: Duanne Guess MD FACS Previous Signature: 05/12/2023 2:18:41 PM Version By: Duanne Guess MD FACS Entered By: Pearletha Alfred on 05/17/2023 13:06:57  to his diuretic regimen and keeping his legs elevated is much as possible. He was instructed to wear his Fabian November wraps religiously on a daily basis. We will discharge him from the wound care center. He may follow-up as needed. Electronic Signature(s) Signed: 05/12/2023 2:18:23 PM By: Duanne Guess MD FACS Previous Signature: 05/12/2023 2:13:01 PM Version By: Duanne Guess MD FACS Entered By: Duanne Guess on 05/12/2023 14:18:23 -------------------------------------------------------------------------------- HxROS Details Patient Name: Date of Service: Peter Molt RD V. 05/12/2023 12:45 PM Medical Record Number: [id number] Patient Account Number: [id number] Date of Birth/Sex: Treating RN: 1955/11/14 (67 y.o. 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 Gastrointestinal Medical History: Past Medical History Notes: GERD Endocrine Medical History: Positive for: Type II Diabetes Time with diabetes: 10 years Treated with: Insulin, Oral agents Blood sugar testing results: Breakfast: 234 Genitourinary Medical History: Past Medical History Notes: CKD stage 4 Neurologic Medical History: Positive for: Neuropathy Immunizations Pneumococcal Vaccine: Received Pneumococcal Vaccination: No Implantable Devices None Hospitalization / Surgery History Type of Hospitalization/Surgery Appendectomy Lumbar disc surgery Family and Social History Unknown History: Yes; Former smoker - ended on 08/11/2015; Marital Status - Single; Alcohol Use: Never; Drug Use: No History; Caffeine Use: Daily; Financial Concerns: No; Food, Clothing or Shelter Needs: No; Support System Lacking: No; Transportation Concerns: No Psychologist, prison and probation services) Signed: 05/12/2023 4:32:07 PM By: Duanne Guess MD FACS Entered By: Duanne Guess on 05/12/2023 14:11:43 -------------------------------------------------------------------------------- SuperBill Details Patient Name: Date of Service: Peter Molt RD V. 05/12/2023 Medical Record Number: [id number] Patient Account Number: [id number] Date of Birth/Sex: Treating RN: March 04, 1956 (67 y.o. M) Primary Care Provider: Herbie Drape Other Clinician: Referring Provider: Treating Provider/Extender: Verita Schneiders in Treatment: [address], [place] V ([id number]) [id number] Page 7 of 7 Diagnosis Coding ICD-10  Codes Code Description [phone] 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d L97.822 Non-pressure chronic ulcer of other part of left lower leg with fat layer exposed I89.0 Lymphedema, not elsewhere classified E11.622 Type 2 diabetes mellitus with other skin ulcer I50.22 Chronic systolic (congestive) heart failure N18.4 Chronic kidney disease, stage 4 (severe) E66.01 Morbid (severe) obesity due to excess calories Facility Procedures : CPT4 Code: 18841660 Description: 63016 - WOUND CARE VISIT-LEV 2 EST PT Modifier: Quantity: 1 Physician Procedures : CPT4 Code Description Modifier [id number] 99213 - WC PHYS LEVEL 3 - EST PT ICD-10 Diagnosis Description L97.812 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d L97.822 Non-pressure chronic ulcer of other part of left  lower leg with fat layer exposed I89.0 Lymphedema, not elsewhere classified E11.622 Type 2 diabetes mellitus with other skin ulcer Quantity: 1 Electronic Signature(s) Signed: 05/17/2023 1:06:58 PM By: Pearletha Alfred Signed: 05/17/2023 1:55:07 PM By: Duanne Guess MD FACS Previous Signature: 05/12/2023 2:18:41 PM Version By: Duanne Guess MD FACS Entered By: Pearletha Alfred on 05/17/2023 13:06:57

## 2023-05-12 NOTE — Progress Notes (Addendum)
SOCTT, OBROCHTA ([id number]) [id number] Page 1 of 10 Visit Report for 05/12/2023 Arrival Information Details Patient Name: Date of Service: Peter Terry RD V. 05/12/2023 12:45 PM Medical Record Number: [id number] Patient Account Number: [id number] Date of Birth/Sex: Treating RN: June 23, 1956 (67 y.o. Peter Terry Primary Care Peter Terry: Peter Terry Other Clinician: Referring Peter Terry: Treating Nami Strawder/Extender: Verita Schneiders in Treatment: 3 Visit Information History Since Last Visit Added or deleted any medications: Yes Patient Arrived: Ambulatory Any new allergies or adverse reactions: No Arrival Time: 12:47 Had a fall or experienced change in No Accompanied By: self activities of daily living that may affect Transfer Assistance: None risk of falls: Patient Identification Verified: Yes Signs or symptoms of abuse/neglect since last visito No Secondary Verification Process Completed: Yes Hospitalized since last visit: Yes Implantable device outside of the clinic excluding No cellular tissue based products placed in the center since last visit: Has Dressing in Place as Prescribed: No Has Compression in Place as Prescribed: Yes Pain Present Now: No Electronic Signature(s) Signed: 05/12/2023 1:39:50 PM By: Tommie Ard RN Entered By: Tommie Ard on 05/12/2023 10:04:23 -------------------------------------------------------------------------------- Clinic Level of Care Assessment Details Patient Name: Date of Service: Peter Terry RD V. 05/12/2023 12:45 PM Medical Record Number: [id number] Patient Account Number: [id number] Date of Birth/Sex: Treating RN: 10-09-1955 (67 y.o. M) Primary Care Carlisa Eble: Peter Terry Other Clinician: Referring Peter Terry: Treating Peter Terry/Extender: Verita Schneiders in Treatment: 3 Clinic Level of Care Assessment Items TOOL 4 Quantity Score []  - 0 Use when only an EandM is  performed on FOLLOW-UP visit ASSESSMENTS - Nursing Assessment / Reassessment X- 1 10 Reassessment of Co-morbidities (includes updates in patient status) X- 1 5 Reassessment of Adherence to Treatment Plan ASSESSMENTS - Wound and Skin A ssessment / Reassessment X - Simple Wound Assessment / Reassessment - one wound 1 5 []  - 0 Complex Wound Assessment / Reassessment - multiple wounds []  - 0 Dermatologic / Skin Assessment (not related to wound area) ASSESSMENTS - Focused Assessment X- 1 5 Circumferential Edema Measurements - multi extremities []  - 0 Nutritional Assessment / Counseling / Intervention Peter, Terry ([id number]) [id number] Page 2 of 10 []  - 0 Lower Extremity Assessment (monofilament, tuning fork, pulses) []  - 0 Peripheral Arterial Disease Assessment (using hand held doppler) ASSESSMENTS - Ostomy and/or Continence Assessment and Care []  - 0 Incontinence Assessment and Management []  - 0 Ostomy Care Assessment and Management (repouching, etc.) PROCESS - Coordination of Care []  - 0 Simple Patient / Family Education for ongoing care []  - 0 Complex (extensive) Patient / Family Education for ongoing care []  - 0 Staff obtains Chiropractor, Records, T Results / Process Orders est []  - 0 Staff telephones HHA, Nursing Homes / Clarify orders / etc []  - 0 Routine Transfer to another Facility (non-emergent condition) []  - 0 Routine Hospital Admission (non-emergent condition) []  - 0 New Admissions / Manufacturing engineer / Ordering NPWT Apligraf, etc. , []  - 0 Emergency Hospital Admission (emergent condition) X- 1 10 Simple Discharge Coordination []  - 0 Complex (extensive) Discharge Coordination PROCESS - Special Needs []  - 0 Pediatric / Minor Patient Management []  - 0 Isolation Patient Management []  - 0 Hearing / Language / Visual special needs []  - 0 Assessment of Community assistance (transportation, D/C planning, etc.) []  -  0 Additional assistance / Altered mentation []  - 0 Support Surface(s) Assessment (bed, cushion, seat, etc.) INTERVENTIONS - Wound Cleansing / Measurement []  - 0 Simple Wound Cleansing - one wound []  -  Mass Index(BMI): 45.8 Blood Pressure(mmHg): 172/74 Temperature(F): 98.2 Respiratory Rate(breaths/min): 20 [1:Photos:] [N/A:N/A] Right, Lateral Lower Leg Left, Lateral Lower Leg N/A Wound Location: Gradually Appeared Gradually Appeared N/A Wounding Event: Lymphedema Lymphedema N/A Primary Etiology: Anemia, Congestive Heart Failure, Anemia, Congestive Heart Failure, N/A Comorbid History: Hypertension, Peripheral Venous Hypertension, Peripheral Venous Disease, Type II Diabetes, Disease, Type II Diabetes, Neuropathy Neuropathy 04/16/2022 04/16/2022 N/A Date Acquired: 3 3 N/A Weeks of Treatment: Healed - Epithelialized Healed - Epithelialized N/A Wound Status: No No N/A Wound Recurrence: Yes Yes N/A Clustered Wound: 0x0x0 0x0x0 N/A Measurements L x W x D (cm) 0 0 N/A A (cm) : rea 0 0 N/A Volume (cm) : 100.00% 100.00% N/A % Reduction in Area: 100.00% 100.00% N/A % Reduction in Volume: Full Thickness Without Exposed Full Thickness Without Exposed N/A Classification: Support Structures Support Structures None Present None Present N/A Exudate A mount: N/A Thickened N/A Wound Margin: Large (67-100%) Small (1-33%) N/A Granulation Amount: Pink, Hyper-granulation Pink,  Hyper-granulation N/A Granulation Quality: None Present (0%) Large (67-100%) N/A Necrotic Amount: N/A Eschar N/A Necrotic Tissue: Fat Layer (Subcutaneous Tissue): Yes Fat Layer (Subcutaneous Tissue): Yes N/A Exposed Structures: OATHER, Peter Terry ([id number]) [id number] Page 5 of 10 Fascia: No Fascia: No Tendon: No Tendon: No Muscle: No Muscle: No Joint: No Joint: No Bone: No Bone: No Small (1-33%) Small (1-33%) N/A Epithelialization: Scarring: Yes Scarring: Yes N/A Periwound Skin Texture: No Abnormalities Noted No Abnormalities Noted N/A Periwound Skin Moisture: Hemosiderin Staining: Yes Hemosiderin Staining: Yes N/A Periwound Skin Color: No Abnormality No Abnormality N/A Temperature: Treatment Notes Wound #1 (Lower Leg) Wound Laterality: Right, Lateral Cleanser Peri-Wound Care Topical Primary Dressing Secondary Dressing Secured With Compression Wrap Compression Stockings Add-Ons Wound #2 (Lower Leg) Wound Laterality: Left, Lateral Cleanser Peri-Wound Care Topical Primary Dressing Secondary Dressing Secured With Compression Wrap Compression Stockings Add-Ons Electronic Signature(s) Signed: 05/12/2023 2:10:46 PM By: Duanne Guess MD FACS Entered By: Duanne Guess on 05/12/2023 11:10:46 -------------------------------------------------------------------------------- Multi-Disciplinary Care Plan Details Patient Name: Date of Service: Peter Terry RD V. 05/12/2023 12:45 PM Medical Record Number: [id number] Patient Account Number: [id number] Date of Birth/Sex: Treating RN: [birth year] (67 y.o. Peter Terry Primary Care Sharna Gabrys: Peter Terry Other Clinician: Referring Ester Hilley: Treating Ananth Fiallos/Extender: Verita Schneiders in Treatment: 3 Active Inactive Electronic Signature(s) KIT, DELUCA ([id number]) [id number] Page 6 of 10 Signed: 05/12/2023 1:31:05 PM By: Tommie Ard  RN Entered By: Tommie Ard on 05/12/2023 10:31:05 -------------------------------------------------------------------------------- Pain Assessment Details Patient Name: Date of Service: Peter Terry RD V. 05/12/2023 12:45 PM Medical Record Number: [id number] Patient Account Number: [id number] Date of Birth/Sex: Treating RN: 1955-10-27 (67 y.o. Peter Terry Primary Care Julion Gatt: Peter Terry Other Clinician: Referring Beryle Zeitz: Treating Dawon Troop/Extender: Verita Schneiders in Treatment: 3 Active Problems Location of Pain Severity and Description of Pain Patient Has Paino No Site Locations Pain Management and Medication Current Pain Management: Electronic Signature(s) Signed: 05/12/2023 1:39:50 PM By: Tommie Ard RN Entered By: Tommie Ard on 05/12/2023 09:49:11 -------------------------------------------------------------------------------- Patient/Caregiver Education Details Patient Name: Date of Service: Peter Terry RD V. 10/2/2024andnbsp12:45 PM Medical Record Number: [id number] Patient Account Number: [id number] Date of Birth/Gender: Treating RN: 1956/02/07 (67 y.o. Peter Terry Primary Care Physician: Peter Terry Other Clinician: Referring Physician: Treating Physician/Extender: Verita Schneiders in Treatment: 3 Education Assessment Education Provided To: Patient ALEXANDE, WILLEVER ([id number]) [id number] Page 7 of 10 Education Topics Provided Discharge Packet: Methods: Explain/Verbal Responses: State content correctly Electronic Signature(s) Signed: 05/12/2023 1:39:50 PM By:  SOCTT, OBROCHTA ([id number]) [id number] Page 1 of 10 Visit Report for 05/12/2023 Arrival Information Details Patient Name: Date of Service: Peter Terry RD V. 05/12/2023 12:45 PM Medical Record Number: [id number] Patient Account Number: [id number] Date of Birth/Sex: Treating RN: June 23, 1956 (67 y.o. Peter Terry Primary Care Peter Terry: Peter Terry Other Clinician: Referring Peter Terry: Treating Nami Strawder/Extender: Verita Schneiders in Treatment: 3 Visit Information History Since Last Visit Added or deleted any medications: Yes Patient Arrived: Ambulatory Any new allergies or adverse reactions: No Arrival Time: 12:47 Had a fall or experienced change in No Accompanied By: self activities of daily living that may affect Transfer Assistance: None risk of falls: Patient Identification Verified: Yes Signs or symptoms of abuse/neglect since last visito No Secondary Verification Process Completed: Yes Hospitalized since last visit: Yes Implantable device outside of the clinic excluding No cellular tissue based products placed in the center since last visit: Has Dressing in Place as Prescribed: No Has Compression in Place as Prescribed: Yes Pain Present Now: No Electronic Signature(s) Signed: 05/12/2023 1:39:50 PM By: Tommie Ard RN Entered By: Tommie Ard on 05/12/2023 10:04:23 -------------------------------------------------------------------------------- Clinic Level of Care Assessment Details Patient Name: Date of Service: Peter Terry RD V. 05/12/2023 12:45 PM Medical Record Number: [id number] Patient Account Number: [id number] Date of Birth/Sex: Treating RN: 10-09-1955 (67 y.o. M) Primary Care Carlisa Eble: Peter Terry Other Clinician: Referring Peter Terry: Treating Peter Terry/Extender: Verita Schneiders in Treatment: 3 Clinic Level of Care Assessment Items TOOL 4 Quantity Score []  - 0 Use when only an EandM is  performed on FOLLOW-UP visit ASSESSMENTS - Nursing Assessment / Reassessment X- 1 10 Reassessment of Co-morbidities (includes updates in patient status) X- 1 5 Reassessment of Adherence to Treatment Plan ASSESSMENTS - Wound and Skin A ssessment / Reassessment X - Simple Wound Assessment / Reassessment - one wound 1 5 []  - 0 Complex Wound Assessment / Reassessment - multiple wounds []  - 0 Dermatologic / Skin Assessment (not related to wound area) ASSESSMENTS - Focused Assessment X- 1 5 Circumferential Edema Measurements - multi extremities []  - 0 Nutritional Assessment / Counseling / Intervention Peter, Terry ([id number]) [id number] Page 2 of 10 []  - 0 Lower Extremity Assessment (monofilament, tuning fork, pulses) []  - 0 Peripheral Arterial Disease Assessment (using hand held doppler) ASSESSMENTS - Ostomy and/or Continence Assessment and Care []  - 0 Incontinence Assessment and Management []  - 0 Ostomy Care Assessment and Management (repouching, etc.) PROCESS - Coordination of Care []  - 0 Simple Patient / Family Education for ongoing care []  - 0 Complex (extensive) Patient / Family Education for ongoing care []  - 0 Staff obtains Chiropractor, Records, T Results / Process Orders est []  - 0 Staff telephones HHA, Nursing Homes / Clarify orders / etc []  - 0 Routine Transfer to another Facility (non-emergent condition) []  - 0 Routine Hospital Admission (non-emergent condition) []  - 0 New Admissions / Manufacturing engineer / Ordering NPWT Apligraf, etc. , []  - 0 Emergency Hospital Admission (emergent condition) X- 1 10 Simple Discharge Coordination []  - 0 Complex (extensive) Discharge Coordination PROCESS - Special Needs []  - 0 Pediatric / Minor Patient Management []  - 0 Isolation Patient Management []  - 0 Hearing / Language / Visual special needs []  - 0 Assessment of Community assistance (transportation, D/C planning, etc.) []  -  0 Additional assistance / Altered mentation []  - 0 Support Surface(s) Assessment (bed, cushion, seat, etc.) INTERVENTIONS - Wound Cleansing / Measurement []  - 0 Simple Wound Cleansing - one wound []  -  0 Complex Wound Cleansing - multiple wounds X- 1 5 Wound Imaging (photographs - any number of wounds) []  - 0 Wound Tracing (instead of photographs) []  - 0 Simple Wound Measurement - one wound []  - 0 Complex Wound Measurement - multiple wounds INTERVENTIONS - Wound Dressings []  - 0 Small Wound Dressing one or multiple wounds []  - 0 Medium Wound Dressing one or multiple wounds []  - 0 Large Wound Dressing one or multiple wounds []  - 0 Application of Medications - topical []  - 0 Application of Medications - injection INTERVENTIONS - Miscellaneous []  - 0 External ear exam []  - 0 Specimen Collection (cultures, biopsies, blood, body fluids, etc.) []  - 0 Specimen(s) / Culture(s) sent or taken to Lab for analysis []  - 0 Patient Transfer (multiple staff / Nurse, adult / Similar devices) []  - 0 Simple Staple / Suture removal (25 or less) []  - 0 Complex Staple / Suture removal (26 or more) []  - 0 Hypo / Hyperglycemic Management (close monitor of Blood Glucose) KENICHI, TREU V ([id number]) [id number] Page 3 of 10 []  - 0 Ankle / Brachial Index (ABI) - do not check if billed separately X- 1 5 Vital Signs Has the patient been seen at the hospital within the last three years: Yes Total Score: 45 Level Of Care: New/Established - Level 2 Electronic Signature(s) Signed: 06/09/2023 3:29:13 PM By: Pearletha Alfred Entered By: Pearletha Alfred on 05/17/2023 10:01:24 -------------------------------------------------------------------------------- Encounter Discharge Information Details Patient Name: Date of Service: Peter Terry RD V. 05/12/2023 12:45 PM Medical Record Number: [id number] Patient Account Number: [id number] Date of Birth/Sex: Treating RN: May 06, 1956 (67  y.o. Peter Terry Primary Care Iyad Deroo: Peter Terry Other Clinician: Referring Jethro Radke: Treating Almadelia Looman/Extender: Verita Schneiders in Treatment: 3 Encounter Discharge Information Items Discharge Condition: Stable Ambulatory Status: Ambulatory Discharge Destination: Home Transportation: Private Auto Accompanied By: self Schedule Follow-up Appointment: Yes Clinical Summary of Care: Electronic Signature(s) Signed: 05/12/2023 1:32:06 PM By: Tommie Ard RN Entered By: Tommie Ard on 05/12/2023 10:32:06 -------------------------------------------------------------------------------- Lower Extremity Assessment Details Patient Name: Date of Service: Peter Terry RD V. 05/12/2023 12:45 PM Medical Record Number: [id number] Patient Account Number: [id number] Date of Birth/Sex: Treating RN: [birth year] (67 y.o. Peter Terry Primary Care Tiegan Jambor: Peter Terry Other Clinician: Referring Lynnix Schoneman: Treating Ejay Lashley/Extender: Verita Schneiders in Treatment: 3 Edema Assessment Assessed: Kyra Searles: No] [Right: No] Edema: [Left: Yes] [Right: Yes] Calf Left: Right: Point of Measurement: 38 cm From Medial Instep 49.5 cm 50 cm Ankle Left: Right: Point of Measurement: 12 cm From Medial Instep 25 cm 28.5 cm Vascular Assessment Left: [[id number] Page 4 of 10Right:] Pulses: Dorsalis Pedis Palpable: [[id number] Page 4 of 10Yes Yes] Extremity colors, hair growth, and conditions: Extremity Color: [[id number].pdf Page 4 of 10Hyperpigmented Hyperpigmented] Hair Growth on Extremity: [[id number] Page 4 of 10No No] Temperature of Extremity: [[id number].pdf Page 4 of 10Warm Warm] Capillary Refill: [id number] Page 4 of 10< 3 seconds < 3 seconds] Dependent Rubor: [[id number]  Page 4 of 10No Yes No No] Electronic Signature(s) Signed: 05/12/2023 1:39:50 PM By: Tommie Ard RN Entered By: Tommie Ard on 05/12/2023 09:55:13 -------------------------------------------------------------------------------- Multi Wound Chart Details Patient Name: Date of Service: Peter Terry RD V. 05/12/2023 12:45 PM Medical Record Number: [id number] Patient Account Number: [id number] Date of Birth/Sex: Treating RN: [birth year] (67 y.o. M) Primary Care Harshita Bernales: Peter Terry Other Clinician: Referring Londynn Sonoda: Treating Kyllian Clingerman/Extender: Verita Schneiders in Treatment: 3 Vital Signs Height(in): 69 Capillary Blood Glucose(mg/dl): 973 Weight(lbs): 532 Pulse(bpm): 88 Body  0 Complex Wound Cleansing - multiple wounds X- 1 5 Wound Imaging (photographs - any number of wounds) []  - 0 Wound Tracing (instead of photographs) []  - 0 Simple Wound Measurement - one wound []  - 0 Complex Wound Measurement - multiple wounds INTERVENTIONS - Wound Dressings []  - 0 Small Wound Dressing one or multiple wounds []  - 0 Medium Wound Dressing one or multiple wounds []  - 0 Large Wound Dressing one or multiple wounds []  - 0 Application of Medications - topical []  - 0 Application of Medications - injection INTERVENTIONS - Miscellaneous []  - 0 External ear exam []  - 0 Specimen Collection (cultures, biopsies, blood, body fluids, etc.) []  - 0 Specimen(s) / Culture(s) sent or taken to Lab for analysis []  - 0 Patient Transfer (multiple staff / Nurse, adult / Similar devices) []  - 0 Simple Staple / Suture removal (25 or less) []  - 0 Complex Staple / Suture removal (26 or more) []  - 0 Hypo / Hyperglycemic Management (close monitor of Blood Glucose) KENICHI, TREU V ([id number]) [id number] Page 3 of 10 []  - 0 Ankle / Brachial Index (ABI) - do not check if billed separately X- 1 5 Vital Signs Has the patient been seen at the hospital within the last three years: Yes Total Score: 45 Level Of Care: New/Established - Level 2 Electronic Signature(s) Signed: 06/09/2023 3:29:13 PM By: Pearletha Alfred Entered By: Pearletha Alfred on 05/17/2023 10:01:24 -------------------------------------------------------------------------------- Encounter Discharge Information Details Patient Name: Date of Service: Peter Terry RD V. 05/12/2023 12:45 PM Medical Record Number: [id number] Patient Account Number: [id number] Date of Birth/Sex: Treating RN: May 06, 1956 (67  y.o. Peter Terry Primary Care Iyad Deroo: Peter Terry Other Clinician: Referring Jethro Radke: Treating Almadelia Looman/Extender: Verita Schneiders in Treatment: 3 Encounter Discharge Information Items Discharge Condition: Stable Ambulatory Status: Ambulatory Discharge Destination: Home Transportation: Private Auto Accompanied By: self Schedule Follow-up Appointment: Yes Clinical Summary of Care: Electronic Signature(s) Signed: 05/12/2023 1:32:06 PM By: Tommie Ard RN Entered By: Tommie Ard on 05/12/2023 10:32:06 -------------------------------------------------------------------------------- Lower Extremity Assessment Details Patient Name: Date of Service: Peter Terry RD V. 05/12/2023 12:45 PM Medical Record Number: [id number] Patient Account Number: [id number] Date of Birth/Sex: Treating RN: [birth year] (67 y.o. Peter Terry Primary Care Tiegan Jambor: Peter Terry Other Clinician: Referring Lynnix Schoneman: Treating Ejay Lashley/Extender: Verita Schneiders in Treatment: 3 Edema Assessment Assessed: Kyra Searles: No] [Right: No] Edema: [Left: Yes] [Right: Yes] Calf Left: Right: Point of Measurement: 38 cm From Medial Instep 49.5 cm 50 cm Ankle Left: Right: Point of Measurement: 12 cm From Medial Instep 25 cm 28.5 cm Vascular Assessment Left: [[id number] Page 4 of 10Right:] Pulses: Dorsalis Pedis Palpable: [id number] Page 4 of 10Yes Yes] Extremity colors, hair growth, and conditions: Extremity Color: [[id number] Page 4 of 10Hyperpigmented Hyperpigmented] Hair Growth on Extremity: [[id number] Page 4 of 10No No] Temperature of Extremity: [[id number].pdf Page 4 of 10Warm Warm] Capillary Refill: [id number].pdf Page 4 of 10< 3 seconds < 3 seconds] Dependent Rubor: [[id number].pdf  Page 4 of 10No Yes No No] Electronic Signature(s) Signed: 05/12/2023 1:39:50 PM By: Tommie Ard RN Entered By: Tommie Ard on 05/12/2023 09:55:13 -------------------------------------------------------------------------------- Multi Wound Chart Details Patient Name: Date of Service: Peter Terry RD V. 05/12/2023 12:45 PM Medical Record Number: [id number] Patient Account Number: [id number] Date of Birth/Sex: Treating RN: [birth year] (67 y.o. M) Primary Care Harshita Bernales: Peter Terry Other Clinician: Referring Londynn Sonoda: Treating Kyllian Clingerman/Extender: Verita Schneiders in Treatment: 3 Vital Signs Height(in): 69 Capillary Blood Glucose(mg/dl): 973 Weight(lbs): 532 Pulse(bpm): 88 Body

## 2023-05-12 NOTE — Assessment & Plan Note (Signed)
Metformin stopped in hospital due to concerns about CKD. Continue Insulin glargine (Basaglar) insulin 35 units twice daily. I recommend he increase his Humalog 75/25 to 35 units twice daily.

## 2023-05-12 NOTE — Assessment & Plan Note (Signed)
By history this is improved with more intensive inpatient care. Patient was reluctant to remove lower leg wraps today.

## 2023-05-12 NOTE — Assessment & Plan Note (Signed)
Compensated. Significant lower leg edema is likely multifactorial. Weight is down 17 lbs. Emphasized the importance of weighting daily and reaching out if weight increased by 4-5 lbs in 3-4 days. Continue carvedilol 25 mg bid and torsemide 20 mg daily 2 tabs twice daily. Follow-up with cardiology tomorrow as scheduled.

## 2023-05-12 NOTE — Assessment & Plan Note (Signed)
Losartan was stopped in the hospital. Blood pressure is in acceptable control. Continue carvedilol 25 mg bid, and amlodipine 10 mg daily.

## 2023-05-12 NOTE — Assessment & Plan Note (Signed)
I will renew his iron supplements. Recheck CBC today.

## 2023-05-12 NOTE — Progress Notes (Signed)
[facility] PRIMARY CARE [facility] PRIMARY Trecia Rogers [facility] [address] [place] [address] Dept: [phone] Dept Fax: [phone]  Hospital Follow-up Visit  Subjective:    Patient ID: Peter Terry, male    DOB: March 16, 1956, 67 y.o..   MRN: [id number]  Chief Complaint  Patient presents with   Hospitalization Follow-up    Hospital f/u for CHF.    History of Present Illness:  Patient is in today for follow-up form his recent hospitalization. Peter Terry was admitted at [facility] from 9/22-9/30/2024 with an acute exacerbation of chronic HFpEF. He notes this ended up being a positive thing, as it has allowed for him to have more intensive treatment of his lower leg wounds, which he states are now mostly healed. He feels his breathing is improved as long as he paces himself.  Past Medical History: Patient Active Problem List   Diagnosis Date Noted   Acute heart failure with preserved ejection fraction (HFpEF) (HCC) 05/02/2023   Dyspnea on exertion 05/02/2023   Multiple open wounds of lower extremity 05/02/2023   Type 2 diabetes mellitus with hyperglycemia, with long-term current use of insulin (HCC) 05/02/2023   Chronic kidney disease, stage 4 (severe) (HCC) 03/31/2023   Obstructive sleep apnea 03/31/2023   GERD (gastroesophageal reflux disease) 03/31/2023   Edema of both lower legs 03/31/2023   Venous stasis dermatitis 03/31/2023   Tinea pedis 03/31/2023   Heart Failure with Preserved EF (HCC) 03/31/2023   Diabetic peripheral neuropathy (HCC) 03/31/2023   Mixed hyperlipidemia 03/30/2023   Essential hypertension 03/30/2023   COPD (chronic obstructive pulmonary disease) (HCC) 03/30/2023   Type 2 diabetes mellitus with stage 4 chronic kidney disease, with long-term current use of insulin (HCC) 11/04/2022   Hypomagnesemia 11/04/2022   Iron deficiency anemia 03/31/2022   Insulin long-term use (HCC) 08/21/2020   Morbid obesity with BMI of 45.0-49.9, adult (HCC) 07/25/2020    Retinal tear 02/20/2013   Past Surgical History:  Procedure Laterality Date   APPENDECTOMY     LUMBAR DISC SURGERY     L4-L5   Family History  Problem Relation Age of Onset   Cancer Mother    Diabetes Mother    Kidney disease Mother    Heart disease Father    Cancer Father        Colon   Kidney disease Father    Diabetes Father    Diabetes Brother    Heart disease Paternal Aunt    Outpatient Medications Prior to Visit  Medication Sig Dispense Refill   albuterol (VENTOLIN HFA) 108 (90 Base) MCG/ACT inhaler Inhale 2 puffs into the lungs every 4 (four) hours as needed for wheezing or shortness of breath.     amLODipine (NORVASC) 10 MG tablet Take 1 tablet (10 mg total) by mouth every morning. 90 tablet 3   atorvastatin (LIPITOR) 80 MG tablet Take 80 mg by mouth at bedtime.     carvedilol (COREG) 25 MG tablet Take 25 mg by mouth 2 (two) times daily.     hydrALAZINE (APRESOLINE) 100 MG tablet Take 100 mg by mouth 3 (three) times daily.     Insulin Glargine (BASAGLAR KWIKPEN) 100 UNIT/ML Inject 35 Units into the skin 2 (two) times daily.     Insulin Lispro Prot & Lispro (HUMALOG 75/25 MIX) (75-25) 100 UNIT/ML Kwikpen Inject 25 Units into the skin 2 (two) times daily with a meal.     isosorbide mononitrate (IMDUR) 60 MG 24 hr tablet Take 60 mg by mouth daily.  losartan (COZAAR) 100 MG tablet Take 1 tablet (100 mg total) by mouth daily. 90 tablet 3   omeprazole (PRILOSEC) 20 MG capsule Take 1 capsule (20 mg total) by mouth daily. 90 capsule 3   potassium chloride (MICRO-K) 10 MEQ CR capsule Take 10 mEq by mouth daily.     torsemide (DEMADEX) 20 MG tablet Take 20 mg by mouth daily.     ferrous sulfate 325 (65 FE) MG EC tablet Take 1 tablet (325 mg total) by mouth 3 (three) times daily with meals. 180 tablet 3   metFORMIN (GLUCOPHAGE-XR) 500 MG 24 hr tablet Take 1,000 mg by mouth 2 (two) times daily.     cephALEXin (KEFLEX) 500 MG capsule Take 1 capsule (500 mg total) by mouth 4 (four)  times daily. 28 capsule 0   No facility-administered medications prior to visit.   No Known Allergies   Objective:   Today's Vitals   05/12/23 1408  BP: 138/68  Pulse: 85  Temp: 98.8 F (37.1 C)  TempSrc: Temporal  SpO2: 97%  Weight: (!) 315 lb 6.4 oz (143.1 kg)  Height: 5\' 9"  (1.753 m)   Body mass index is 46.58 kg/m.   General: Well developed, well nourished. No acute distress. Lungs: Distant breath sounds. No wheezing, rales or rhonchi. CV: RRR without murmurs or rubs. Pulses 2+ bilaterally. Psych: Alert and oriented. Normal mood and affect.  Health Maintenance Due  Topic Date Due   Medicare Annual Wellness (AWV)  Never done   Pneumonia Vaccine 39+ Years old (1 of 2 - PCV) Never done   OPHTHALMOLOGY EXAM  Never done   HIV Screening  Never done   Hepatitis C Screening  Never done   DTaP/Tdap/Td (1 - Tdap) Never done   Zoster Vaccines- Shingrix (1 of 2) Never done     Assessment & Plan:   Problem List Items Addressed This Visit       Cardiovascular and Mediastinum   Essential hypertension    Losartan was stopped in the hospital. Blood pressure is in acceptable control. Continue carvedilol 25 mg bid, and amlodipine 10 mg daily.        Relevant Medications   torsemide (DEMADEX) 20 MG tablet   Other Relevant Orders   Comprehensive metabolic panel   Heart Failure with Preserved EF (HCC) - Primary    Compensated. Significant lower leg edema is likely multifactorial. Weight is down 17 lbs. Emphasized the importance of weighting daily and reaching out if weight increased by 4-5 lbs in 3-4 days. Continue carvedilol 25 mg bid and torsemide 20 mg daily 2 tabs twice daily. Follow-up with cardiology tomorrow as scheduled.      Relevant Medications   torsemide (DEMADEX) 20 MG tablet   Other Relevant Orders   Comprehensive metabolic panel   CBC   Magnesium     Endocrine   Type 2 diabetes mellitus with stage 4 chronic kidney disease, with long-term current use of  insulin (HCC)    Metformin stopped in hospital due to concerns about CKD. Continue Insulin glargine (Basaglar) insulin 35 units twice daily. I recommend he increase his Humalog 75/25 to 35 units twice daily.      Relevant Medications   Insulin Lispro Prot & Lispro (HUMALOG MIX 75/25 KWIKPEN) (75-25) 100 UNIT/ML Kwikpen   Other Relevant Orders   Comprehensive metabolic panel     Genitourinary   Chronic kidney disease, stage 4 (severe) (HCC)    I will recheck renal labs today. I will ask the  referral team to assist him with a follow-up to see Dr. Janit Pagan (nephrology).      Relevant Orders   Comprehensive metabolic panel   Ambulatory referral to Nephrology     Other   Multiple open wounds of lower extremity (Chronic)    By history this is improved with more intensive inpatient care. Patient was reluctant to remove lower leg wraps today.      Iron deficiency anemia    I will renew his iron supplements. Recheck CBC today.      Relevant Medications   ferrous sulfate 325 (65 FE) MG EC tablet   Other Visit Diagnoses     Need for pneumococcal 20-valent conjugate vaccination       Relevant Orders   Pneumococcal conjugate vaccine 20-valent (Completed)       Return in about 4 weeks (around 06/09/2023) for Reassessment.   [name], MD

## 2023-05-13 ENCOUNTER — Ambulatory Visit: Payer: Medicare HMO

## 2023-05-13 ENCOUNTER — Telehealth: Payer: Self-pay

## 2023-05-13 VITALS — BP 156/74 | HR 72 | Ht 69.6 in | Wt 316.0 lb

## 2023-05-13 DIAGNOSIS — N184 Chronic kidney disease, stage 4 (severe): Secondary | ICD-10-CM

## 2023-05-13 DIAGNOSIS — I10 Essential (primary) hypertension: Secondary | ICD-10-CM | POA: Diagnosis not present

## 2023-05-13 DIAGNOSIS — I5022 Chronic systolic (congestive) heart failure: Secondary | ICD-10-CM

## 2023-05-13 LAB — CBC
HCT: 27.8 % — ABNORMAL LOW (ref 39.0–52.0)
Hemoglobin: 8.8 g/dL — ABNORMAL LOW (ref 13.0–17.0)
MCHC: 31.5 g/dL (ref 30.0–36.0)
MCV: 76.3 fL — ABNORMAL LOW (ref 78.0–100.0)
Platelets: 249 10*3/uL (ref 150.0–400.0)
RBC: 3.64 Mil/uL — ABNORMAL LOW (ref 4.22–5.81)
RDW: 18.6 % — ABNORMAL HIGH (ref 11.5–15.5)
WBC: 9.3 10*3/uL (ref 4.0–10.5)

## 2023-05-13 LAB — COMPREHENSIVE METABOLIC PANEL
ALT: 15 U/L (ref 0–53)
AST: 14 U/L (ref 0–37)
Albumin: 3.2 g/dL — ABNORMAL LOW (ref 3.5–5.2)
Alkaline Phosphatase: 84 U/L (ref 39–117)
BUN: 84 mg/dL — ABNORMAL HIGH (ref 6–23)
CO2: 25 meq/L (ref 19–32)
Calcium: 8.3 mg/dL — ABNORMAL LOW (ref 8.4–10.5)
Chloride: 102 meq/L (ref 96–112)
Creatinine, Ser: 4.37 mg/dL — ABNORMAL HIGH (ref 0.40–1.50)
GFR: 13.47 mL/min — CL (ref >60.00)
Glucose, Bld: 127 mg/dL — ABNORMAL HIGH (ref 70–99)
Potassium: 4 meq/L (ref 3.5–5.1)
Sodium: 138 meq/L (ref 135–145)
Total Bilirubin: 0.4 mg/dL (ref 0.2–1.2)
Total Protein: 7.6 g/dL (ref 6.0–8.3)

## 2023-05-13 LAB — MAGNESIUM: Magnesium: 2.1 mg/dL (ref 1.5–2.5)

## 2023-05-13 NOTE — Assessment & Plan Note (Addendum)
Hypervolemic, compensated, NYHA class III Weight up by 3 pounds compared to his discharge date 05-07-2023. Feels he might not be diuresing as much with current dose of torsemide 40 mg in the morning and 40 mg in the evening.  Unclear if he is currently taking losartan but mentions this was discontinued at discharge from the hospital.  On amlodipine, carvedilol, hydralazine and isosorbide mononitrate.  Complicated situation poor candidate for any kind of further escalation of heart failure preserved ejection fraction medication therapy due to renal function.

## 2023-05-13 NOTE — Assessment & Plan Note (Signed)
Suboptimal control.  Medication list still has losartan but he mentions this was discontinued at the hospital. Remains on carvedilol and amlodipine along with hydralazine, isosorbide.  Continue current medications.

## 2023-05-13 NOTE — Assessment & Plan Note (Signed)
Currently with worsening renal function, at high risk for metabolic derangement.  He has establish care with nephrologist Dr.Nwobu during inpatient stay and is pending follow-up with him. Recommended to give their office a call right away.  Discussed with him potential requirement for hemodialysis if he is having recurrent acute kidney injuries and not adequate dialysis to maintain optimal fluid status with his heart failure.

## 2023-05-13 NOTE — Patient Instructions (Signed)
Medication Instructions:  Your physician recommends that you continue on your current medications as directed. Please refer to the Current Medication list given to you today.  *If you need a refill on your cardiac medications before your next appointment, please call your pharmacy*   Lab Work: None ordered If you have labs (blood work) drawn today and your tests are completely normal, you will receive your results only by: MyChart Message (if you have MyChart) OR A paper copy in the mail If you have any lab test that is abnormal or we need to change your treatment, we will call you to review the results.   Testing/Procedures: None ordered   Follow-Up: At [facility], you and your health needs are our priority.  As part of our continuing mission to provide you with exceptional heart care, we have created designated Provider Care Teams.  These Care Teams include your primary Cardiologist (physician) and Advanced Practice Providers (APPs -  Physician Assistants and Nurse Practitioners) who all work together to provide you with the care you need, when you need it.  We recommend signing up for the patient portal called "MyChart".  Sign up information is provided on this After Visit Summary.  MyChart is used to connect with patients for Virtual Visits (Telemedicine).  Patients are able to view lab/test results, encounter notes, upcoming appointments, etc.  Non-urgent messages can be sent to your provider as well.   To learn more about what you can do with MyChart, go to [email or web address].    Your next appointment:   1 month(s)  The format for your next appointment:   In Person  Provider:   Huntley Dec, MD    Other Instructions none  Important Information About Sugar

## 2023-05-13 NOTE — Progress Notes (Signed)
Cardiology Consultation:    Date:  05/13/2023   ID:  Peter Terry, DOB [birth year], MRN [id number]  PCP:  [name], MD  Cardiologist:  Marlyn Corporal Satrina Magallanes, MD   Referring MD: [name], MD  Nephorlogist: Dr Janit Pagan  No chief complaint on file. Heart failure with preserved ejection fraction   ASSESSMENT AND PLAN:   Mr Peter Terry 67 year old with morbid obesity, hypertension, diabetes mellitus, CKD stage IIIb obstructive sleep apnea intolerant to CPAP, bilateral lower extremity venous stasis with chronic ulcers, hyperlipidemia, chronic heart failure with preserved ejection fraction and recent acute flareup related admission to [facility], now here to establish care.  With 2 to 3 pound weight gain since his discharge on September 27, creatinine worsening from discharge day levels of 3.37, yesterday on follow-up labs increased up to 4.37.   Problem List Items Addressed This Visit     Essential hypertension - Primary    Suboptimal control.  Medication list still has losartan but he mentions this was discontinued at the hospital. Remains on carvedilol and amlodipine along with hydralazine, isosorbide.  Continue current medications.      Relevant Orders   EKG 12-Lead (Completed)   Chronic kidney disease, stage 4 (severe) (HCC)    Currently with worsening renal function, at high risk for metabolic derangement.  He has establish care with nephrologist Dr.Nwobu during inpatient stay and is pending follow-up with him. Recommended to give their office a call right away.  Discussed with him potential requirement for hemodialysis if he is having recurrent acute kidney injuries and not adequate dialysis to maintain optimal fluid status with his heart failure.      Heart Failure with Preserved EF (HCC)    Hypervolemic, compensated, NYHA class III Weight up by 3 pounds compared to his discharge date 05-07-2023. Feels he might not be diuresing as much with  current dose of torsemide 40 mg in the morning and 40 mg in the evening.  Unclear if he is currently taking losartan but mentions this was discontinued at discharge from the hospital.  On amlodipine, carvedilol, hydralazine and isosorbide mononitrate.  Complicated situation poor candidate for any kind of further escalation of heart failure preserved ejection fraction medication therapy due to renal function.      Return to clinic in 1 month.   History of Present Illness:    Peter Terry is a 67 y.o. male who is being seen today for the evaluation of heart failure at the request of Rudd, Bertram Millard, MD.  Has history of chronic heart failure with preserved ejection fraction, hypertension, type 2 diabetes, CKD stage IIIb with baseline creatinine around 2, COPD, obstructive sleep apnea noncompliant with CPAP, morbid obesity, GERD, bilateral lower extremity venous stasis and chronic ulcers, hyperlipidemia,  Was recently admitted at [facility] from 05-02-2023 through 05-10-2023 for acute heart failure with preserved ejection fraction.  Was admitted for progressively worsening shortness of breath, weight gain.  Troponin was mildly elevated at 35.  BNP was noted to be within normal limits, duplex to the lower extremities negative for DVT, echocardiogram noted LVEF 60 to 65%, improved with IV Lasix for diuresis and nephrology had seen him as inpatient.  Subsequently had follow-up visit with his PCP and now here to establish care.  Mentions no prior visits with cardiologist. Lives at home with his father who is in his 89s.  He himself does not have any children.  Has chronic shortness  of breath.  Since discharge from the hospital he is able to keep up with his day-to-day activities. Mentions he has been taking torsemide 40 mg in the morning and 40 mg in the evening doses tolerating well. Mentions his discharge weight from the hospital was 313 pounds today he weighs around  316 pounds.  Mentions good compliance with medications.  Missed this morning's dose of isosorbide mononitrate as he forgot after sitting at the side to be taken few minutes later.  Denies any chest pain.  Has been sleeping in a recliner. Does not feel like he is urinating as much with his current dose of torsemide 8 40 mg once twice daily [is taking 2 pills of 20 mg in the morning and 2 pills in the evening].  Denies any blood in urine or stools.  EKG in the clinic today shows sinus rhythm heart rate 72/min with no significant ST-T segment changes.   Last blood work from 05-12-2023 noted magnesium 2.1, sodium 138, potassium 4, BUN 84, creatinine 4.37 [in comparison BUN was 52 and creatinine 3.37 on discharge from [facility] on 05-07-2023] Calcium 8.3  Last echocardiogram available to review 05-04-2023 at [facility] reported moderate LVH, EF 60 to 65%, mildly dilated left atrium,    Past Medical History:  Diagnosis Date   Acute heart failure with preserved ejection fraction (HFpEF) (HCC) 05/02/2023   CHF (congestive heart failure) (HCC)    Chronic kidney disease, stage 4 (severe) (HCC) 03/31/2023   COPD (chronic obstructive pulmonary disease) (HCC) 03/30/2023   Diabetic peripheral neuropathy (HCC) 03/31/2023   Dyspnea on exertion 05/02/2023   Edema of both lower legs 03/31/2023   Essential hypertension 03/30/2023   GERD (gastroesophageal reflux disease) 03/31/2023   Heart Failure with Preserved EF (HCC) 03/31/2023   High cholesterol    Hypertension    Hypomagnesemia 11/04/2022   Insulin long-term use (HCC) 08/21/2020   Iron deficiency anemia 03/31/2022   Mixed hyperlipidemia 03/30/2023   Morbid obesity with BMI of 45.0-49.9, adult (HCC) 07/25/2020   Multiple open wounds of lower extremity 05/02/2023   Obstructive sleep apnea 03/31/2023   Retinal tear 02/20/2013   Tinea pedis 03/31/2023   Type 2 diabetes mellitus with hyperglycemia,  with long-term current use of insulin (HCC) 05/02/2023   Type 2 diabetes mellitus with stage 4 chronic kidney disease, with long-term current use of insulin (HCC) 11/04/2022   Venous stasis dermatitis 03/31/2023    Past Surgical History:  Procedure Laterality Date   APPENDECTOMY     LUMBAR DISC SURGERY     L4-L5    Current Medications: Current Meds  Medication Sig   albuterol (VENTOLIN HFA) 108 (90 Base) MCG/ACT inhaler Inhale 2 puffs into the lungs every 4 (four) hours as needed for wheezing or shortness of breath.   amLODipine (NORVASC) 10 MG tablet Take 1 tablet (10 mg total) by mouth every morning.   atorvastatin (LIPITOR) 80 MG tablet Take 80 mg by mouth at bedtime.   carvedilol (COREG) 25 MG tablet Take 25 mg by mouth 2 (two) times daily.   ferrous sulfate 325 (65 FE) MG EC tablet Take 1 tablet (325 mg total) by mouth 3 (three) times daily with meals.   hydrALAZINE (APRESOLINE) 100 MG tablet Take 100 mg by mouth 3 (three) times daily.   Insulin Glargine (BASAGLAR KWIKPEN) 100 UNIT/ML Inject 35 Units into the skin 2 (two) times daily.   Insulin Lispro Prot & Lispro (HUMALOG MIX 75/25 KWIKPEN) (75-25)  100 UNIT/ML Kwikpen Inject 35 Units into the skin in the morning and at bedtime.   isosorbide mononitrate (IMDUR) 60 MG 24 hr tablet Take 60 mg by mouth daily.   losartan (COZAAR) 100 MG tablet Take 1 tablet (100 mg total) by mouth daily.   omeprazole (PRILOSEC) 20 MG capsule Take 1 capsule (20 mg total) by mouth daily.   potassium chloride (MICRO-K) 10 MEQ CR capsule Take 10 mEq by mouth daily.   torsemide (DEMADEX) 20 MG tablet Take 2 tablets (40 mg total) by mouth 2 (two) times daily.     Allergies:   Patient has no known allergies.   Social History   Socioeconomic History   Marital status: Married    Spouse name: Not on file   Number of children: 1   Years of education: Not on file   Highest education level: GED or equivalent  Occupational History   Occupation: Air cabin crew  Delivery  Tobacco Use   Smoking status: Every Day    Current packs/day: 1.00    Types: Cigarettes   Smokeless tobacco: Not on file  Vaping Use   Vaping status: Never Used  Substance and Sexual Activity   Alcohol use: No   Drug use: No   Sexual activity: Not Currently  Other Topics Concern   Not on file  Social History Narrative   Not on file   Social Determinants of Health   Financial Resource Strain: Not on file  Food Insecurity: Medium Risk (05/02/2023)   Received from [facility]   Hunger Vital Sign    Worried About Running Out of Food in the Last Year: Sometimes true    Ran Out of Food in the Last Year: Sometimes true  Transportation Needs: No Transportation Needs (05/02/2023)   Received from [facility]    In the past 12 months, has lack of reliable transportation kept you from medical appointments, meetings, work or from getting things needed for daily living? : No  Physical Activity: Insufficiently Active (04/10/2023)   Exercise Vital Sign    Days of Exercise per Week: 2 days    Minutes of Exercise per Session: 10 min  Stress: No Stress Concern Present (04/10/2023)   [employer] of Occupational Health - Occupational Stress Questionnaire    Feeling of Stress : Only a little  Social Connections: Socially Isolated (04/10/2023)   Social Connection and Isolation Panel [NHANES]    Frequency of Communication with Friends and Family: Once a week    Frequency of Social Gatherings with Friends and Family: Never    Attends Religious Services: Never    Diplomatic Services operational officer: No    Attends Engineer, structural: Not on file    Marital Status: Never married     Family History: The patient's family history includes Cancer in his father and mother; Diabetes in his brother, father, and mother; Heart disease in his father and paternal aunt; Kidney disease in his father and mother. ROS:   Please see the history of present illness.     All 14 point review of systems negative except as described per history of present illness.  EKGs/Labs/Other Studies Reviewed:    The following studies were reviewed today:   EKG:  EKG Interpretation Date/Time:  Thursday May 13 2023 13:12:00 EDT Ventricular Rate:  72 PR Interval:  186 QRS Duration:  94 QT Interval:  406 QTC Calculation: 444 R Axis:   42  Text Interpretation: Normal sinus rhythm Normal  ECG When compared with ECG of 05-Jun-2021 02:07, PREVIOUS ECG IS PRESENT Confirmed by Huntley Dec reddy [phone]) on 05/13/2023 1:26:23 PM    Recent Labs: 05/12/2023: ALT 15; BUN 84; Creatinine, Ser 4.37; Hemoglobin 8.8 Repeated and verified X2.; Magnesium 2.1; Platelets 249.0; Potassium 4.0; Sodium 138  Recent Lipid Panel    Component Value Date/Time   CHOL 165 03/31/2023 1118   TRIG 220.0 (H) 03/31/2023 1118   HDL 30.50 (L) 03/31/2023 1118   CHOLHDL 5 03/31/2023 1118   VLDL 44.0 (H) 03/31/2023 1118   LDLDIRECT 106.0 03/31/2023 1118    Physical Exam:    VS:  BP (!) 156/74   Pulse 72   Ht 5' 9.6" (1.768 m)   Wt (!) 316 lb 0.6 oz (143.4 kg)   SpO2 96%   BMI 45.87 kg/m     Wt Readings from Last 3 Encounters:  05/13/23 (!) 316 lb 0.6 oz (143.4 kg)  05/12/23 (!) 315 lb 6.4 oz (143.1 kg)  04/14/23 (!) 332 lb 9.6 oz (150.9 kg)     GENERAL:  Well nourished, well developed in no acute distress NECK: No JVD; No carotid bruits CARDIAC: RRR, S1 and S2 present, no murmurs, no rubs, no gallops CHEST:  Clear to auscultation without rales, wheezing or rhonchi  Extremities: Bilateral 2+ pitting pedal edema with compression stockings on.  NEUROLOGIC:  Alert and oriented x 3  Medication Adjustments/Labs and Tests Ordered: Current medicines are reviewed at length with the patient today.  Concerns regarding medicines are outlined above.  Orders Placed This Encounter  Procedures   EKG 12-Lead   No orders of the defined types were placed in this  encounter.   Signed, Avyan Livesay reddy Narely Nobles, MD, MPH, [facility]. 05/13/2023 1:56 PM    [facility]

## 2023-05-13 NOTE — Telephone Encounter (Signed)
CRITICAL VALUE STICKER  CRITICAL VALUE: GFR 13.47  RECEIVER (on-site recipient of call): Arvil Persons  DATE & TIME NOTIFIED: 05/13/23 @ 1120  MESSENGER (representative from lab): Orlean Patten  MD NOTIFIED: Dr. Veto Kemps  TIME OF NOTIFICATION: 1123  RESPONSE:  MD notified for review.

## 2023-05-18 ENCOUNTER — Encounter: Payer: Self-pay | Admitting: Physician Assistant

## 2023-05-18 ENCOUNTER — Ambulatory Visit: Payer: Medicare HMO | Admitting: Physician Assistant

## 2023-05-18 ENCOUNTER — Ambulatory Visit (HOSPITAL_COMMUNITY)
Admission: RE | Admit: 2023-05-18 | Discharge: 2023-05-18 | Disposition: A | Discharge status: Home / Self Care | Payer: Medicare HMO | Source: Ambulatory Visit | Attending: Vascular Surgery | Admitting: Vascular Surgery

## 2023-05-18 VITALS — BP 155/74 | HR 74 | Temp 98.7°F | Resp 22 | Ht 69.0 in | Wt 323.7 lb

## 2023-05-18 DIAGNOSIS — I872 Venous insufficiency (chronic) (peripheral): Secondary | ICD-10-CM | POA: Insufficient documentation

## 2023-05-18 DIAGNOSIS — E1122 Type 2 diabetes mellitus with diabetic chronic kidney disease: Secondary | ICD-10-CM | POA: Diagnosis not present

## 2023-05-18 DIAGNOSIS — N184 Chronic kidney disease, stage 4 (severe): Secondary | ICD-10-CM | POA: Diagnosis not present

## 2023-05-18 DIAGNOSIS — I129 Hypertensive chronic kidney disease with stage 1 through stage 4 chronic kidney disease, or unspecified chronic kidney disease: Secondary | ICD-10-CM | POA: Diagnosis not present

## 2023-05-18 DIAGNOSIS — I890 Lymphedema, not elsewhere classified: Secondary | ICD-10-CM | POA: Diagnosis not present

## 2023-05-18 DIAGNOSIS — Z794 Long term (current) use of insulin: Secondary | ICD-10-CM | POA: Diagnosis not present

## 2023-05-18 NOTE — Progress Notes (Signed)
[facility] [place]   Reason for referral: Swollen B leg/ankle and feet  History of Present Illness  Peter Terry is a 67 y.o. male who presents with chief complaint: swollen leg.  Patient notes, onset of swelling >12 months ago, associated with prolonged sitting inability to lay supine, CHF and obesity.  The patient has had no history of DVT, no history of varicose vein, positive history of venous stasis ulcers, positive history of  Lymphedema and positve history of skin changes in lower legs.  There is unkown family history of venous disorders.  The patient has used compression stockings in the 3 days.  He is unable to lay supine and spends most of his time sitting in a recliner as well as sleeps in a recliner.  He lives a sedentary lifestyle due to SOB at rest with significant CHF.  He currently does not have LE open wounds.  He has been fopllowed by the [facility] wound center. For over 3 weeks.     Past medical history morbid obesity, hypertension, diabetes mellitus, CKD stage IV obstructive sleep apnea intolerant to CPAP, bilateral lower extremity venous stasis with chronic ulcers, hyperlipidemia, chronic heart failure with preserved ejection fraction and recent acute flareup related admission to [facility], now here to establish care.  He has establish care with nephrologist Dr.Nwobu.      Past Medical History:  Diagnosis Date   Acute heart failure with preserved ejection fraction (HFpEF) (HCC) 05/02/2023   CHF (congestive heart failure) (HCC)    Chronic kidney disease, stage 4 (severe) (HCC) 03/31/2023   COPD (chronic obstructive pulmonary disease) (HCC) 03/30/2023   Diabetic peripheral neuropathy (HCC) 03/31/2023   Dyspnea on exertion 05/02/2023   Edema of both lower legs 03/31/2023   Essential hypertension 03/30/2023   GERD (gastroesophageal reflux disease) 03/31/2023   Heart Failure with Preserved EF (HCC) 03/31/2023   High cholesterol     Hypertension    Hypomagnesemia 11/04/2022   Insulin long-term use (HCC) 08/21/2020   Iron deficiency anemia 03/31/2022   Mixed hyperlipidemia 03/30/2023   Morbid obesity with BMI of 45.0-49.9, adult (HCC) 07/25/2020   Multiple open wounds of lower extremity 05/02/2023   Obstructive sleep apnea 03/31/2023   Retinal tear 02/20/2013   Tinea pedis 03/31/2023   Type 2 diabetes mellitus with hyperglycemia, with long-term current use of insulin (HCC) 05/02/2023   Type 2 diabetes mellitus with stage 4 chronic kidney disease, with long-term current use of insulin (HCC) 11/04/2022   Venous stasis dermatitis 03/31/2023    Past Surgical History:  Procedure Laterality Date   APPENDECTOMY     LUMBAR DISC SURGERY     L4-L5    Social History   Socioeconomic History   Marital status: Single    Spouse name: Not on file   Number of children: 1   Years of education: Not on file   Highest education level: GED or equivalent  Occupational History   Occupation: Air cabin crew Delivery  Tobacco Use   Smoking status: Former    Types: Cigarettes   Smokeless tobacco: Not on file  Vaping Use   Vaping status: Never Used  Substance and Sexual Activity   Alcohol use: No   Drug use: No   Sexual activity: Not Currently  Other Topics Concern   Not on file  Social History Narrative   Not on file   Social Determinants of Health   Financial Resource Strain: Not on file  Food Insecurity: Medium  Risk (05/02/2023)   Received from [facility]   Hunger Vital Sign    Worried About Running Out of Food in the Last Year: Sometimes true    Ran Out of Food in the Last Year: Sometimes true  Transportation Needs: No Transportation Needs (05/02/2023)   Received from [facility]   Transportation    In the past 12 months, has lack of reliable transportation kept you from medical appointments, meetings, work or from getting things needed for daily living? : No  Physical Activity: Insufficiently Active  (04/10/2023)   Exercise Vital Sign    Days of Exercise per Week: 2 days    Minutes of Exercise per Session: 10 min  Stress: No Stress Concern Present (04/10/2023)   [employer] of Occupational Health - Occupational Stress Questionnaire    Feeling of Stress : Only a little  Social Connections: Socially Isolated (04/10/2023)   Social Connection and Isolation Panel [NHANES]    Frequency of Communication with Friends and Family: Once a week    Frequency of Social Gatherings with Friends and Family: Never    Attends Religious Services: Never    Diplomatic Services operational officer: No    Attends Engineer, structural: Not on file    Marital Status: Never married  Catering manager Violence: Not on file    Family History  Problem Relation Age of Onset   Cancer Mother    Diabetes Mother    Kidney disease Mother    Heart disease Father    Cancer Father        Colon   Kidney disease Father    Diabetes Father    Diabetes Brother    Heart disease Paternal Aunt     Current Outpatient Medications on File Prior to Visit  Medication Sig Dispense Refill   albuterol (VENTOLIN HFA) 108 (90 Base) MCG/ACT inhaler Inhale 2 puffs into the lungs every 4 (four) hours as needed for wheezing or shortness of breath.     amLODipine (NORVASC) 10 MG tablet Take 1 tablet (10 mg total) by mouth every morning. 90 tablet 3   atorvastatin (LIPITOR) 80 MG tablet Take 80 mg by mouth at bedtime.     carvedilol (COREG) 25 MG tablet Take 25 mg by mouth 2 (two) times daily.     ferrous sulfate 325 (65 FE) MG EC tablet Take 1 tablet (325 mg total) by mouth 3 (three) times daily with meals. 180 tablet 3   hydrALAZINE (APRESOLINE) 100 MG tablet Take 100 mg by mouth 3 (three) times daily.     Insulin Glargine (BASAGLAR KWIKPEN) 100 UNIT/ML Inject 35 Units into the skin 2 (two) times daily.     Insulin Lispro Prot & Lispro (HUMALOG MIX 75/25 KWIKPEN) (75-25) 100 UNIT/ML Kwikpen Inject 35 Units into the skin  in the morning and at bedtime.     isosorbide mononitrate (IMDUR) 60 MG 24 hr tablet Take 60 mg by mouth daily.     losartan (COZAAR) 100 MG tablet Take 1 tablet (100 mg total) by mouth daily. 90 tablet 3   omeprazole (PRILOSEC) 20 MG capsule Take 1 capsule (20 mg total) by mouth daily. 90 capsule 3   potassium chloride (MICRO-K) 10 MEQ CR capsule Take 10 mEq by mouth daily.     torsemide (DEMADEX) 20 MG tablet Take 2 tablets (40 mg total) by mouth 2 (two) times daily.     No current facility-administered medications on file prior to visit.    Allergies  as of 05/18/2023   (No Known Allergies)     ROS:   General:  No weight loss, Fever, chills  HEENT: No recent headaches, no nasal bleeding, no visual changes, no sore throat  Neurologic: No dizziness, blackouts, seizures. No recent symptoms of stroke or mini- stroke. No recent episodes of slurred speech, or temporary blindness.  Cardiac: No recent episodes of chest pain/pressure, positive shortness of breath at rest.  positve shortness of breath with exertion.  Denies history of atrial fibrillation or irregular heartbeat  Vascular: No history of rest pain in feet.  No history of claudication.  positive history of non-healing ulcer, No history of DVT   Pulmonary: No home oxygen, no productive cough, no hemoptysis,  No asthma or wheezing  Musculoskeletal:  [ ]  Arthritis, [ ]  Low back pain,  [ ]  Joint pain  Hematologic:No history of hypercoagulable state.  No history of easy bleeding.  No history of anemia  Gastrointestinal: No hematochezia or melena,  No gastroesophageal reflux, no trouble swallowing  Urinary: [ x] chronic Kidney disease, [ ]  on HD - [ ]  MWF or [ ]  TTHS, [ ]  Burning with urination, [ ]  Frequent urination, [ ]  Difficulty urinating;   Skin: No rashes  Psychological: No history of anxiety,  No history of depression  Physical Examination  Vitals:   05/18/23 1226  BP: (!) 155/74  Pulse: 74  Resp: (!) 22  Temp:  98.7 F (37.1 C)  TempSrc: Temporal  SpO2: 94%  Weight: (!) 323 lb 11.2 oz (146.8 kg)  Height: 5\' 9"  (1.753 m)    Body mass index is 47.8 kg/m.  General:  Alert and oriented, no acute distress HEENT: Normal Neck: No bruit or JVD Pulmonary: Clear to auscultation bilaterally, distant breath sounds Cardiac: Regular Rate and Rhythm without murmur, distance heart sounds Abdomen: Soft, non-tender, non-distended, no mass, no scars Skin: No rash Extremity Pulses:  radial pulses are palpable, doppler AT/DP signals B LE Musculoskeletal: significant edema B LE  Neurologic: Upper and lower extremity motor grossly intact  and symmetric          Mid calf measure 49.0 cm right and 49.0 cm left    DATA: +--------------+---------+------+-----------+------------+------------+  LEFT         Reflux NoRefluxReflux TimeDiameter cmsComments                              Yes                                       +--------------+---------+------+-----------+------------+------------+  CFV                    yes   >1 second                           +--------------+---------+------+-----------+------------+------------+  FV mid        no                                                  +--------------+---------+------+-----------+------------+------------+  Popliteal    no                                                  +--------------+---------+------+-----------+------------+------------+  GSV at [facility] [place]              yes    >500 ms      0.79    roleaux flow  +--------------+---------+------+-----------+------------+------------+  GSV prox thigh          yes    >500 ms      0.69                  +--------------+---------+------+-----------+------------+------------+  GSV mid thigh no                            0.54                  +--------------+---------+------+-----------+------------+------------+  GSV dist thighno                             0.46                  +--------------+---------+------+-----------+------------+------------+  GSV at knee   no                            0.44                  +--------------+---------+------+-----------+------------+------------+  GSV prox calf no                            0.33                  +--------------+---------+------+-----------+------------+------------+  GSV mid calf  no                            0.30                  +--------------+---------+------+-----------+------------+------------+  SSV Pop Fossa no                            0.55                  +--------------+---------+------+-----------+------------+------------+  SSV prox calf no                            0.45                  +--------------+---------+------+-----------+------------+------------+  SSV mid calf  no                            0.37                  +--------------+---------+------+-----------+------------+------------+  AASV O        no                            0.37                  +--------------+---------+------+-----------+------------+------------+  AASV         no                                    NV            +--------------+---------+------+-----------+------------+------------+  Summary:  Left:  - No evidence of obvious deep vein thrombosis seen in the left lower  extremity, from the common femoral through the popliteal veins. Patient  unable to tolerate compressions.  - No evidence of superficial venous thrombosis in the left lower  extremity.  - No evidence of superficial venous reflux seen in the left short  saphenous vein.  - Venous reflux is noted in the left common femoral vein.  - Venous reflux is noted in the left sapheno-femoral junction.  - Venous reflux is noted in the left greater saphenous vein in the thigh.  - No reflux visualized in the AASV.   ABIs L:0.85; R:0.96 studied at the wound  clinic  Assessment/Plan: Minimal venous reflux deep CFV, SFJ and proximal thigh GSV, no DVT, no varicose veins He has lymphedema b LE with swelling in the foot dorsum and toes.  Thicken skin changes, hyperpigmentation and hyperkeratosis.  He is currently trying to use both knee high and thigh high compression to control the edema.   Mid calf measure 49.0 cm right and 49.0 cm left.  He will f/u in 4-5 weeks to be reassessed and new measurements taken.  Plan will be to order BioTAB lymphedema pumps.  He will continue to try and use the compression socks daily and attempt elevation as he tolerates, as well as improve activity. I think the pumps will help him due to the fact he is unable to lay supine and get his legs above the level of his heart.      Mosetta Pigeon PA-C [facility] [place] Office: [phone]  MD in clinic [place]

## 2023-05-28 ENCOUNTER — Ambulatory Visit: Payer: Medicare HMO | Admitting: Family Medicine

## 2023-05-29 DIAGNOSIS — E1165 Type 2 diabetes mellitus with hyperglycemia: Secondary | ICD-10-CM | POA: Diagnosis not present

## 2023-05-31 ENCOUNTER — Ambulatory Visit: Payer: Medicare HMO | Admitting: Family Medicine

## 2023-05-31 ENCOUNTER — Encounter: Payer: Self-pay | Admitting: Family Medicine

## 2023-05-31 VITALS — BP 170/72 | HR 77 | Temp 99.0°F | Ht 69.0 in | Wt 339.6 lb

## 2023-05-31 DIAGNOSIS — I10 Essential (primary) hypertension: Secondary | ICD-10-CM

## 2023-05-31 DIAGNOSIS — I5031 Acute diastolic (congestive) heart failure: Secondary | ICD-10-CM

## 2023-05-31 DIAGNOSIS — M25551 Pain in right hip: Secondary | ICD-10-CM | POA: Diagnosis not present

## 2023-05-31 MED ORDER — TORSEMIDE 20 MG PO TABS
60.0000 mg | ORAL_TABLET | Freq: Two times a day (BID) | ORAL | 11 refills | Status: DC
Start: 2023-05-31 — End: 2023-06-10

## 2023-05-31 MED ORDER — POTASSIUM CHLORIDE ER 10 MEQ PO CPCR
10.0000 meq | ORAL_CAPSULE | Freq: Every day | ORAL | 3 refills | Status: DC
Start: 2023-05-31 — End: 2023-09-03

## 2023-05-31 MED ORDER — TRAMADOL HCL 50 MG PO TABS
50.0000 mg | ORAL_TABLET | Freq: Two times a day (BID) | ORAL | 0 refills | Status: AC | PRN
Start: 2023-05-31 — End: 2023-06-14

## 2023-05-31 NOTE — Assessment & Plan Note (Signed)
Losartan was stopped in the hospital. Blood pressure is high today. he has not taken his noontime hydralazine dose yet. Continue carvedilol 25 mg bid, amlodipine 10 mg daily and hydralazine 100 mg TID. If BP remains high at next appointment, I would consider restarting his losartan.

## 2023-05-31 NOTE — Patient Instructions (Signed)
Increase torsemide 20 mg to taking three tablets twice a day. Weight daily and record weight MA will call you next Monday (10/28) to check on weights.

## 2023-05-31 NOTE — Assessment & Plan Note (Signed)
Fluid retention is increased currently. Weight is up 22 lbs. since Oct. 2. Reviewed nephrology note. Although there has been hesitancy about increasing his diuretic dose, nephrology recommended increasing to 60 mg dose if edema worsened. I recommend Peter Terry increase his torsemide to 60 mg bid. He should weigh daily and record weights. I will have my MA call him in 1 week to reassess his weight and I will see him in 2 weeks.

## 2023-05-31 NOTE — Progress Notes (Signed)
[facility] [address] [place] [address] Dept: [phone] Dept Fax: [phone]  Office Visit  Subjective:    Patient ID: Peter Terry, male    DOB: 1956/03/24, 67 y.o..   MRN: [id number]  Chief Complaint  Patient presents with   Leg Pain    C/o having hip/leg/LRQ pain x 1 week.  No OTC meds taken   also getting SOB and weight is up 16 lbs.    History of Present Illness:  Patient is in today complaining of right hip/upper leg pain over the past week. He notes this hurts whether he is sitting or standing. He finds it particularly bothers him after he has been driving for a while. He is unable to lie flat int he bed due to his heart failure. He is not aware of any particular injury.  Peter Terry was admitted at [facility] from 9/22-9/30/2024 with an acute exacerbation of chronic HFpEF. He notes he does weight himself daily, but had not been looking at his weights. He notes he became depressed seeign how this was going up. Since his last visit with me, he has been seen by Peter Terry (cardiology), Peter Terry (vascular) and Peter Terry (nephrology).  Past Medical History: Patient Active Problem List   Diagnosis Date Noted   Acute heart failure with preserved ejection fraction (HFpEF) (HCC) 05/02/2023   Dyspnea on exertion 05/02/2023   Multiple open wounds of lower extremity 05/02/2023   Type 2 diabetes mellitus with hyperglycemia, with long-term current use of insulin (HCC) 05/02/2023   Chronic kidney disease, stage 4 (severe) (HCC) 03/31/2023   Obstructive sleep apnea 03/31/2023   GERD (gastroesophageal reflux disease) 03/31/2023   Edema of both lower legs 03/31/2023   Venous stasis dermatitis 03/31/2023   Tinea pedis 03/31/2023   Heart Failure with Preserved EF (HCC) 03/31/2023   Diabetic peripheral neuropathy (HCC) 03/31/2023   Mixed hyperlipidemia 03/30/2023   Essential hypertension 03/30/2023   COPD (chronic  obstructive pulmonary disease) (HCC) 03/30/2023   Type 2 diabetes mellitus with stage 4 chronic kidney disease, with long-term current use of insulin (HCC) 11/04/2022   Hypomagnesemia 11/04/2022   Iron deficiency anemia 03/31/2022   Insulin long-term use (HCC) 08/21/2020   Morbid obesity with BMI of 45.0-49.9, adult (HCC) 07/25/2020   Retinal tear 02/20/2013   Past Surgical History:  Procedure Laterality Date   APPENDECTOMY     LUMBAR DISC SURGERY     L4-L5   Family History  Problem Relation Age of Onset   Cancer Mother    Diabetes Mother    Kidney disease Mother    Heart disease Father    Cancer Father        Colon   Kidney disease Father    Diabetes Father    Diabetes Brother    Heart disease Paternal Aunt    Outpatient Medications Prior to Visit  Medication Sig Dispense Refill   albuterol (VENTOLIN HFA) 108 (90 Base) MCG/ACT inhaler Inhale 2 puffs into the lungs every 4 (four) hours as needed for wheezing or shortness of breath.     amLODipine (NORVASC) 10 MG tablet Take 1 tablet (10 mg total) by mouth every morning. 90 tablet 3   atorvastatin (LIPITOR) 80 MG tablet Take 80 mg by mouth at bedtime.     carvedilol (COREG) 25 MG tablet Take 25 mg by mouth 2 (two) times daily.     ferrous sulfate 325 (65 FE) MG EC tablet Take 1 tablet (325  mg total) by mouth 3 (three) times daily with meals. 180 tablet 3   hydrALAZINE (APRESOLINE) 100 MG tablet Take 100 mg by mouth 3 (three) times daily.     Insulin Glargine (BASAGLAR KWIKPEN) 100 UNIT/ML Inject 35 Units into the skin 2 (two) times daily.     Insulin Lispro Prot & Lispro (HUMALOG MIX 75/25 KWIKPEN) (75-25) 100 UNIT/ML Kwikpen Inject 35 Units into the skin in the morning and at bedtime.     isosorbide mononitrate (IMDUR) 60 MG 24 hr tablet Take 60 mg by mouth daily.     losartan (COZAAR) 100 MG tablet Take 1 tablet (100 mg total) by mouth daily. 90 tablet 3   omeprazole (PRILOSEC) 20 MG capsule Take 1 capsule (20 mg total) by  mouth daily. 90 capsule 3   potassium chloride (MICRO-K) 10 MEQ CR capsule Take 10 mEq by mouth daily.     torsemide (DEMADEX) 20 MG tablet Take 2 tablets (40 mg total) by mouth 2 (two) times daily.     No facility-administered medications prior to visit.   No Known Allergies   Objective:   Today's Vitals   05/31/23 1315  BP: (!) 170/72  Pulse: 77  Temp: 99 F (37.2 C)  TempSrc: Temporal  SpO2: 99%  Weight: (!) 339 lb 9.6 oz (154 kg)  Height: 5\' 9"  (1.753 m)   Body mass index is 50.15 kg/m.   General: Well developed, well nourished. No acute distress. Extremities: ROM limited by obesity/edema. Pain indicated along the inguinal ligament and l;eft upper leg area. No   obvious swelling noted in the inguinal canal. There is 4+ edema of both lwoer legs. Neuro: CN II-XII intact. Normal sensation and DTR bilaterally. Psych: Alert and oriented. Normal mood and affect.  Health Maintenance Due  Topic Date Due   Medicare Annual Wellness (AWV)  Never done   OPHTHALMOLOGY EXAM  Never done   HIV Screening  Never done   Hepatitis C Screening  Never done   DTaP/Tdap/Td (1 - Tdap) Never done   Zoster Vaccines- Shingrix (1 of 2) Never done     Assessment & Plan:   Problem List Items Addressed This Visit       Cardiovascular and Mediastinum   Acute heart failure with preserved ejection fraction (HFpEF) (HCC) - Primary    Fluid retention is increased currently. Weight is up 22 lbs. since Oct. 2. Reviewed nephrology note. Although there has been hesitancy about increasing his diuretic dose, nephrology recommended increasing to 60 mg dose if edema worsened. I recommend Peter Terry increase his torsemide to 60 mg bid. He should weigh daily and record weights. I will have my MA call him in 1 week to reassess his weight and I will see him in 2 weeks.      Relevant Medications   potassium chloride (MICRO-K) 10 MEQ CR capsule   torsemide (DEMADEX) 20 MG tablet   Essential hypertension     Losartan was stopped in the hospital. Blood pressure is high today. he has not taken his noontime hydralazine dose yet. Continue carvedilol 25 mg bid, amlodipine 10 mg daily and hydralazine 100 mg TID. If BP remains high at next appointment, I would consider restarting his losartan.        Relevant Medications   torsemide (DEMADEX) 20 MG tablet   Other Visit Diagnoses     Acute right hip pain       Etiology is unclear. I will check an x-ray of the right hip  joint. If negative, consider an ultrasound or CTs can to assess further. Rx for tramadol for now.   Relevant Medications   traMADol (ULTRAM) 50 MG tablet   Other Relevant Orders   DG Hip Unilat W OR W/O Pelvis 2-3 Views Right       Return in about 2 weeks (around 06/14/2023).   [name], MD

## 2023-06-08 ENCOUNTER — Telehealth: Payer: Self-pay

## 2023-06-08 NOTE — Telephone Encounter (Signed)
Left VM to rtn call to check to see what his weight is at per recommendation of provider.   Will try to call again tomorrow. Dm/cma

## 2023-06-09 ENCOUNTER — Ambulatory Visit: Payer: Medicare HMO | Admitting: Family Medicine

## 2023-06-09 NOTE — Telephone Encounter (Signed)
Noted. Dm/cma  

## 2023-06-09 NOTE — Telephone Encounter (Signed)
Spoke to patient and his weight has been anywhere form 335-340.    Please review and advise. Dm/cma

## 2023-06-10 ENCOUNTER — Ambulatory Visit: Payer: Medicare HMO

## 2023-06-10 VITALS — BP 167/74 | HR 73 | Ht 69.0 in | Wt 339.0 lb

## 2023-06-10 DIAGNOSIS — N184 Chronic kidney disease, stage 4 (severe): Secondary | ICD-10-CM | POA: Diagnosis not present

## 2023-06-10 DIAGNOSIS — I5031 Acute diastolic (congestive) heart failure: Secondary | ICD-10-CM

## 2023-06-10 MED ORDER — METOLAZONE 5 MG PO TABS: ORAL_TABLET | ORAL | 0 refills | Status: DC

## 2023-06-10 MED ORDER — BUMETANIDE 2 MG PO TABS: 2.0000 mg | ORAL_TABLET | Freq: Every day | ORAL | 3 refills | Status: DC

## 2023-06-10 NOTE — Assessment & Plan Note (Signed)
Hypervolemic, NYHA class III Weight gain over 23 pounds since last visit. Does not see significant diuresis with his current dose of torsemide 60 mg twice daily for over the past week.  Recommended with deterioration in his condition and inadequate diuresis he might need IV diuretic regimen to help with improved urine output while carefully monitoring his ins and outs and electrolytes given his declining renal function.  He was adamant about not going to the ER as he has huge bills to pay. We discussed high risk for mortality.   He does not want to go to the ER. He wants to manage this outpatient. Will discontinue torsemide. Start Bumex 2 mg twice daily Will also start metolazone 5 mg to be used every other day for the next 2 doses [1 dose today 30 minutes prior to the afternoon dose of Bumex, second dose on Saturday prior to one of the doses of Bumex]. To return for blood work basic metabolic panel on Monday.  Keep salt intake below 2 g/day and fluid intake below 1 and half liter per day. Continue with amlodipine 10 mg once daily, carvedilol 25 mg twice daily Hydralazine 100 mg 3 times daily, isosorbide mononitrate 60 mg once daily.  Stressed to him if breathing is not improving or if things are worsening in terms of his symptoms he should get to the nearest ER right away.  He attributes part of his symptoms to wheezing which he in fact is at this time however I told him this is only part of the problem and encouraged him to use his albuterol as needed.

## 2023-06-10 NOTE — Patient Instructions (Signed)
Medication Instructions:  Stop Torsemide  Start Bumetanide (Bumex) 2 mg twice a day  Start Metolazone (Zaroxolyn) 5 mg,  Take 1 tablet by mouth 30 minutes before taking your Bumex today 06/10/23. Take your next dose on Saturday 06/11/23, 30 minutes before taking your Bumex. DO NOT take any more pills after this dose until your next appointment   *If you need a refill on your cardiac medications before your next appointment, please call your pharmacy*   Lab Work: BMET, MAGNESIUM to be drawn on Monday, November 4. Lab is open from 8 AM to 4 PM, Closed for lunch from 11:30 AM to 1 PM   If you have labs (blood work) drawn today and your tests are completely normal, you will receive your results only by: MyChart Message (if you have MyChart) OR A paper copy in the mail If you have any lab test that is abnormal or we need to change your treatment, we will call you to review the results.   Testing/Procedures: None ordered    Follow-Up: At [facility], you and your health needs are our priority.  As part of our continuing mission to provide you with exceptional heart care, we have created designated Provider Care Teams.  These Care Teams include your primary Cardiologist (physician) and Advanced Practice Providers (APPs -  Physician Assistants and Nurse Practitioners) who all work together to provide you with the care you need, when you need it.  We recommend signing up for the patient portal called "MyChart".  Sign up information is provided on this After Visit Summary.  MyChart is used to connect with patients for Virtual Visits (Telemedicine).  Patients are able to view lab/test results, encounter notes, upcoming appointments, etc.  Non-urgent messages can be sent to your provider as well.   To learn more about what you can do with MyChart, go to [email or web address].    Your next appointment:   2 week(s)  Provider:   Huntley Dec, MD    Other Instructions

## 2023-06-10 NOTE — Assessment & Plan Note (Signed)
With worsening renal function, he has not had any lab work done since October 2  He did have nephrology visit October 8,. Was recommended follow-up in couple months.  I think he needs further close monitoring with nephrologist. Given his weight gain I am titrating up the dose of his diuretics as above switching him to Bumex and couple doses of metolazone every other day for the next 4 days. Will recheck labs on Monday.

## 2023-06-10 NOTE — Progress Notes (Signed)
Cardiology Consultation:    Date:  06/10/2023   ID:  Peter Terry, DOB [birth year], MRN [id number]  PCP:  [name], MD  Cardiologist:  Marlyn Corporal Laiyah Exline, MD   Referring MD: [name], MD  Nephrologist:Dr Nwobu; Recently had f/u with Dr Lequita Halt at [facility] on 05-18-2023  No chief complaint on file.    ASSESSMENT AND PLAN:   Mr Mcgougan 67 year old male patient with history of chronic diastolic heart failure with preserved ejection fraction, morbid obesity, hypertension, diabetes mellitus, hyperlipidemia, CKD stage IV, obstructive sleep apnea intolerant to CPAP, chronic venous insufficiency with bilateral lower extremity ulcers, here for follow-up visit  Problem List Items Addressed This Visit     Chronic kidney disease, stage 4 (severe) (HCC)    With worsening renal function, he has not had any lab work done since October 2  He did have nephrology visit October 8,. Was recommended follow-up in couple months.  I think he needs further close monitoring with nephrologist. Given his weight gain I am titrating up the dose of his diuretics as above switching him to Bumex and couple doses of metolazone every other day for the next 4 days. Will recheck labs on Monday.       Acute heart failure with preserved ejection fraction (HFpEF) (HCC) - Primary    Hypervolemic, NYHA class III Weight gain over 23 pounds since last visit. Does not see significant diuresis with his current dose of torsemide 60 mg twice daily for over the past week.  Recommended with deterioration in his condition and inadequate diuresis he might need IV diuretic regimen to help with improved urine output while carefully monitoring his ins and outs and electrolytes given his declining renal function.  He was adamant about not going to the ER as he has huge bills to pay. We discussed high risk for mortality.   He does not want to go to the ER. He wants to manage this  outpatient. Will discontinue torsemide. Start Bumex 2 mg twice daily Will also start metolazone 5 mg to be used every other day for the next 2 doses [1 dose today 30 minutes prior to the afternoon dose of Bumex, second dose on Saturday prior to one of the doses of Bumex]. To return for blood work basic metabolic panel on Monday.  Keep salt intake below 2 g/day and fluid intake below 1 and half liter per day. Continue with amlodipine 10 mg once daily, carvedilol 25 mg twice daily Hydralazine 100 mg 3 times daily, isosorbide mononitrate 60 mg once daily.  Stressed to him if breathing is not improving or if things are worsening in terms of his symptoms he should get to the nearest ER right away.  He attributes part of his symptoms to wheezing which he in fact is at this time however I told him this is only part of the problem and encouraged him to use his albuterol as needed.       Return to clinic next in 2 weeks.   History of Present Illness:    Peter Terry is a 67 y.o. male who is being seen today for follow-up visit.  PCP is Rudd, Bertram Millard, MD. Last visit with [place] was 05-13-2023.  Has history of chronic heart failure with preserved ejection fraction, morbid obesity, diabetes mellitus, hypertension, CKD stage 4, obstructive sleep apnea intolerant to CPAP, bilateral chronic v enous stasis with ulcers, hyperlipidemia.  Lives with his father who is in his  80s at home. Discharge weight from the hospital was 313 pounds on 05-10-2023. Last office visit weight was 316 pounds Weight today in the office 339 pounds.  He mentions his weight has been increasing. His torsemide dose was recently increased to 60 mg twice daily but he feels despite this dose over the past 10 days his urine output has not significantly increased.  He feels short of breath with minimal effort.  Sleeps reclined in the chair. Denies any chest pain Last echocardiogram for comparison is from September 2024 at [facility] while admitted for acute CHF, noted EF 60 to 65%, moderate LVH, mildly dilated left atrium  Last blood work to review is still from May 12, 2023 with sodium 138, potassium 4, BUN 84, creatinine 4.37 In comparison BUN was 52 and creatinine 3.37 on discharge from [facility] 05-07-2023.  Past Medical History:  Diagnosis Date   Acute heart failure with preserved ejection fraction (HFpEF) (HCC) 05/02/2023   CHF (congestive heart failure) (HCC)    Chronic kidney disease, stage 4 (severe) (HCC) 03/31/2023   COPD (chronic obstructive pulmonary disease) (HCC) 03/30/2023   Diabetic peripheral neuropathy (HCC) 03/31/2023   Dyspnea on exertion 05/02/2023   Edema of both lower legs 03/31/2023   Essential hypertension 03/30/2023   GERD (gastroesophageal reflux disease) 03/31/2023   Heart Failure with Preserved EF (HCC) 03/31/2023   High cholesterol    Hypertension    Hypomagnesemia 11/04/2022   Insulin long-term use (HCC) 08/21/2020   Iron deficiency anemia 03/31/2022   Mixed hyperlipidemia 03/30/2023   Morbid obesity with BMI of 45.0-49.9, adult (HCC) 07/25/2020   Multiple open wounds of lower extremity 05/02/2023   Obstructive sleep apnea 03/31/2023   Retinal tear 02/20/2013   Tinea pedis 03/31/2023   Type 2 diabetes mellitus with hyperglycemia, with long-term current use of insulin (HCC) 05/02/2023   Type 2 diabetes mellitus with stage 4 chronic kidney disease, with long-term current use of insulin (HCC) 11/04/2022   Venous stasis dermatitis 03/31/2023    Past Surgical History:  Procedure Laterality Date   APPENDECTOMY     LUMBAR DISC SURGERY     L4-L5    Current Medications: Current Meds  Medication Sig   albuterol (VENTOLIN HFA) 108 (90 Base) MCG/ACT inhaler Inhale 2 puffs into the lungs every 4 (four) hours as needed for wheezing or shortness of breath.   amLODipine (NORVASC) 10 MG tablet Take 1 tablet (10 mg total) by mouth every  morning.   atorvastatin (LIPITOR) 80 MG tablet Take 80 mg by mouth at bedtime.   carvedilol (COREG) 25 MG tablet Take 25 mg by mouth 2 (two) times daily.   ferrous sulfate 325 (65 FE) MG EC tablet Take 1 tablet (325 mg total) by mouth 3 (three) times daily with meals.   hydrALAZINE (APRESOLINE) 100 MG tablet Take 100 mg by mouth 3 (three) times daily.   Insulin Glargine (BASAGLAR KWIKPEN) 100 UNIT/ML Inject 35 Units into the skin 2 (two) times daily.   Insulin Lispro Prot & Lispro (HUMALOG MIX 75/25 KWIKPEN) (75-25) 100 UNIT/ML Kwikpen Inject 35 Units into the skin in the morning and at bedtime.   isosorbide mononitrate (IMDUR) 60 MG 24 hr tablet Take 60 mg by mouth daily.   omeprazole (PRILOSEC) 20 MG capsule Take 1 capsule (20 mg total) by mouth daily.   potassium chloride (MICRO-K) 10 MEQ CR capsule Take 1 capsule (10 mEq total) by mouth daily.   [DISCONTINUED] losartan (COZAAR)  100 MG tablet Take 1 tablet (100 mg total) by mouth daily.   [DISCONTINUED] torsemide (DEMADEX) 20 MG tablet Take 3 tablets (60 mg total) by mouth 2 (two) times daily.     Allergies:   Patient has no known allergies.   Social History   Socioeconomic History   Marital status: Single    Spouse name: Not on file   Number of children: 1   Years of education: Not on file   Highest education level: GED or equivalent  Occupational History   Occupation: Air cabin crew Delivery  Tobacco Use   Smoking status: Former    Types: Cigarettes   Smokeless tobacco: Not on file  Vaping Use   Vaping status: Never Used  Substance and Sexual Activity   Alcohol use: No   Drug use: No   Sexual activity: Not Currently  Other Topics Concern   Not on file  Social History Narrative   Not on file   Social Determinants of Health   Financial Resource Strain: Low Risk  (05/26/2023)   Overall Financial Resource Strain (CARDIA)    Difficulty of Paying Living Expenses: Not very hard  Food Insecurity: Food Insecurity Present  (05/26/2023)   Hunger Vital Sign    Worried About Running Out of Food in the Last Year: Sometimes true    Ran Out of Food in the Last Year: Sometimes true  Transportation Needs: No Transportation Needs (05/26/2023)   PRAPARE - Administrator, Civil Service (Medical): No    Lack of Transportation (Non-Medical): No  Physical Activity: Insufficiently Active (05/26/2023)   Exercise Vital Sign    Days of Exercise per Week: 2 days    Minutes of Exercise per Session: 20 min  Stress: Stress Concern Present (05/26/2023)   [employer] of Occupational Health - Occupational Stress Questionnaire    Feeling of Stress : To some extent  Social Connections: Unknown (05/26/2023)   Social Connection and Isolation Panel [NHANES]    Frequency of Communication with Friends and Family: Twice a week    Frequency of Social Gatherings with Friends and Family: Never    Attends Religious Services: Never    Diplomatic Services operational officer: Patient declined    Attends Engineer, structural: Not on file    Marital Status: Never married  Recent Concern: Social Connections - Socially Isolated (04/10/2023)   Social Connection and Isolation Panel [NHANES]    Frequency of Communication with Friends and Family: Once a week    Frequency of Social Gatherings with Friends and Family: Never    Attends Religious Services: Never    Diplomatic Services operational officer: No    Attends Engineer, structural: Not on file    Marital Status: Never married     Family History: The patient's family history includes Cancer in his father and mother; Diabetes in his brother, father, and mother; Heart disease in his father and paternal aunt; Kidney disease in his father and mother. ROS:   Please see the history of present illness.    All 14 point review of systems negative except as described per history of present illness.  EKGs/Labs/Other Studies Reviewed:    The following studies were  reviewed today:   EKG:       Recent Labs: 05/12/2023: ALT 15; BUN 84; Creatinine, Ser 4.37; Hemoglobin 8.8 Repeated and verified X2.; Magnesium 2.1; Platelets 249.0; Potassium 4.0; Sodium 138  Recent Lipid Panel    Component Value Date/Time  CHOL 165 03/31/2023 1118   TRIG 220.0 (H) 03/31/2023 1118   HDL 30.50 (L) 03/31/2023 1118   CHOLHDL 5 03/31/2023 1118   VLDL 44.0 (H) 03/31/2023 1118   LDLDIRECT 106.0 03/31/2023 1118    Physical Exam:    VS:  BP (!) 167/74   Pulse 73   Ht 5\' 9"  (1.753 m)   Wt (!) 339 lb (153.8 kg)   SpO2 94%   BMI 50.06 kg/m     Wt Readings from Last 3 Encounters:  06/10/23 (!) 339 lb (153.8 kg)  05/31/23 (!) 339 lb 9.6 oz (154 kg)  05/18/23 (!) 323 lb 11.2 oz (146.8 kg)     GENERAL:  Well nourished, mildly tachypneic.   NECK: No JVD; No carotid bruits CARDIAC: S1, S2 present, regular rate and rhythm. CHEST:  C bilateral mild wheezing, reduced air entry, basilar crackles present. Extremities: Marked bilateral pitting pedal edema extending up to the thighs, dressing of bilateral lower extremities from knees to the ankles. NEUROLOGIC:  Alert and oriented x 3  Medication Adjustments/Labs and Tests Ordered: Current medicines are reviewed at length with the patient today.  Concerns regarding medicines are outlined above.  No orders of the defined types were placed in this encounter.  No orders of the defined types were placed in this encounter.   Signed, Kaiden Pech reddy Eudelia Hiltunen, MD, MPH, [facility]. 06/10/2023 1:46 PM     [facility]

## 2023-06-14 ENCOUNTER — Encounter: Payer: Self-pay | Admitting: Family Medicine

## 2023-06-14 ENCOUNTER — Ambulatory Visit (INDEPENDENT_AMBULATORY_CARE_PROVIDER_SITE_OTHER): Payer: Medicare HMO | Admitting: Family Medicine

## 2023-06-14 VITALS — BP 146/70 | HR 91 | Temp 98.6°F | Ht 69.0 in | Wt 343.6 lb

## 2023-06-14 DIAGNOSIS — I10 Essential (primary) hypertension: Secondary | ICD-10-CM

## 2023-06-14 DIAGNOSIS — N184 Chronic kidney disease, stage 4 (severe): Secondary | ICD-10-CM

## 2023-06-14 DIAGNOSIS — I5031 Acute diastolic (congestive) heart failure: Secondary | ICD-10-CM | POA: Diagnosis not present

## 2023-06-14 DIAGNOSIS — Z794 Long term (current) use of insulin: Secondary | ICD-10-CM | POA: Diagnosis not present

## 2023-06-14 DIAGNOSIS — I5022 Chronic systolic (congestive) heart failure: Secondary | ICD-10-CM

## 2023-06-14 NOTE — Assessment & Plan Note (Signed)
Last eGFR was < 15, consistent with Stage V CKD (kidney failure). Today's results are pending. Not scheduled to see nephrology for 10 weeks. I will reach out to nephrology with my concern for waiting that long.

## 2023-06-14 NOTE — Assessment & Plan Note (Signed)
Losartan was stopped in the hospital. The systolic blood pressure remains high today. Continue carvedilol 25 mg bid, amlodipine 10 mg daily and hydralazine 100 mg TID. I would consider restarting losartan, but want to check with cardiology and nephrology regarding this.

## 2023-06-14 NOTE — Progress Notes (Signed)
[facility] [address] [place] [address] Dept: [phone] Dept Fax: [phone]  Chronic Care Office Visit  Subjective:    Patient ID: Peter Terry, male    DOB: [birth year], 67 y.o..   MRN: [id number]  Chief Complaint  Patient presents with   Follow-up    2 week f/u.  Having SOB with activity, and weight has increased 4 lbs.     History of Present Illness:  Patient is in today for reassessment of chronic medical issues.  Mr. Ridling has a history of heart failure with preserved ejection fraction. His last admission for an acute exacerbation of his heart failure was in late Sept. His discharge weight was 314 lbs. Over the past month, he has experienced progressive weight gain. He saw his cardiologist, Dr. Vincent Gros, on 10/31. He is currently on carvedilol 25 mg bid for heart failure management. He is on amlodipine 5 mg daily and hydralazine 100 mg TID for BP management. His losartan has been on hold since his discharge. Mr. Bruna also has chronic kidney disease with worsened GFR when using diuretics. Due to increasing weight, Dr. Vincent Gros switched him from torsemide to bumetanide 2 mg daily along with metolazone 5 mg every other day. Mr. Kolakowski tells me he had blood drawn this morning. He notes he was told by cardiology that he should follow-up closely with nephrology. However, he is not scheduled to see his nephrologist, Dr. Lequita Halt, until late Jan.  Past Medical History: Patient Active Problem List   Diagnosis Date Noted   Acute heart failure with preserved ejection fraction (HFpEF) (HCC) 05/02/2023   Dyspnea on exertion 05/02/2023   Multiple open wounds of lower extremity 05/02/2023   Type 2 diabetes mellitus with hyperglycemia, with long-term current use of insulin (HCC) 05/02/2023   Chronic kidney disease, stage 4 (severe) (HCC) 03/31/2023   Obstructive sleep apnea 03/31/2023   GERD (gastroesophageal reflux disease)  03/31/2023   Edema of both lower legs 03/31/2023   Venous stasis dermatitis 03/31/2023   Tinea pedis 03/31/2023   Heart Failure with Preserved EF (HCC) 03/31/2023   Diabetic peripheral neuropathy (HCC) 03/31/2023   Mixed hyperlipidemia 03/30/2023   Essential hypertension 03/30/2023   COPD (chronic obstructive pulmonary disease) (HCC) 03/30/2023   Type 2 diabetes mellitus with stage 4 chronic kidney disease, with long-term current use of insulin (HCC) 11/04/2022   Hypomagnesemia 11/04/2022   Iron deficiency anemia 03/31/2022   Insulin long-term use (HCC) 08/21/2020   Morbid obesity with BMI of 45.0-49.9, adult (HCC) 07/25/2020   Retinal tear 02/20/2013   Past Surgical History:  Procedure Laterality Date   APPENDECTOMY     LUMBAR DISC SURGERY     L4-L5   Family History  Problem Relation Age of Onset   Cancer Mother    Diabetes Mother    Kidney disease Mother    Heart disease Father    Cancer Father        Colon   Kidney disease Father    Diabetes Father    Diabetes Brother    Heart disease Paternal Aunt    Outpatient Medications Prior to Visit  Medication Sig Dispense Refill   albuterol (VENTOLIN HFA) 108 (90 Base) MCG/ACT inhaler Inhale 2 puffs into the lungs every 4 (four) hours as needed for wheezing or shortness of breath.     amLODipine (NORVASC) 10 MG tablet Take 1 tablet (10 mg total) by mouth every morning. 90 tablet 3   atorvastatin (LIPITOR) 80 MG  tablet Take 80 mg by mouth at bedtime.     bumetanide (BUMEX) 2 MG tablet Take 1 tablet (2 mg total) by mouth daily. 180 tablet 3   carvedilol (COREG) 25 MG tablet Take 25 mg by mouth 2 (two) times daily.     ferrous sulfate 325 (65 FE) MG EC tablet Take 1 tablet (325 mg total) by mouth 3 (three) times daily with meals. 180 tablet 3   hydrALAZINE (APRESOLINE) 100 MG tablet Take 100 mg by mouth 3 (three) times daily.     Insulin Glargine (BASAGLAR KWIKPEN) 100 UNIT/ML Inject 35 Units into the skin 2 (two) times daily.      Insulin Lispro Prot & Lispro (HUMALOG MIX 75/25 KWIKPEN) (75-25) 100 UNIT/ML Kwikpen Inject 35 Units into the skin in the morning and at bedtime.     isosorbide mononitrate (IMDUR) 60 MG 24 hr tablet Take 60 mg by mouth daily.     metolazone (ZAROXOLYN) 5 MG tablet Take 1 tablet by mouth 30 minutes before taking your Bumex today 06/10/23. Take your next dose on Saturday 06/11/23, 30 minutes before taking your Bumex. DO NOT take any more pills after this dose until your next appointment 7 tablet 0   omeprazole (PRILOSEC) 20 MG capsule Take 1 capsule (20 mg total) by mouth daily. 90 capsule 3   potassium chloride (MICRO-K) 10 MEQ CR capsule Take 1 capsule (10 mEq total) by mouth daily. 90 capsule 3   traMADol (ULTRAM) 50 MG tablet Take 1 tablet (50 mg total) by mouth 2 (two) times daily as needed for up to 14 days. 28 tablet 0   No facility-administered medications prior to visit.   No Known Allergies Objective:   Today's Vitals   06/14/23 1311  BP: (!) 146/70  Pulse: 91  Temp: 98.6 F (37 C)  TempSrc: Temporal  SpO2: 98%  Weight: (!) 343 lb 9.6 oz (155.9 kg)  Height: 5\' 9"  (1.753 m)   Body mass index is 50.74 kg/m.   General: Well developed, well nourished. No acute distress. Lungs: Bilateral expiratory wheeze with distant breath sounds. Tachypnea with exertion. CV: RRR without murmurs or rubs. Pulses 2+ bilaterally. Extremities: 4+ lower leg edema. Psych: Alert and oriented. Normal mood and affect.  Health Maintenance Due  Topic Date Due   OPHTHALMOLOGY EXAM  Never done   HIV Screening  Never done   Hepatitis C Screening  Never done   DTaP/Tdap/Td (1 - Tdap) Never done   Zoster Vaccines- Shingrix (1 of 2) Never done     Assessment & Plan:   Problem List Items Addressed This Visit       Cardiovascular and Mediastinum   Essential hypertension    Losartan was stopped in the hospital. The systolic blood pressure remains high today. Continue carvedilol 25 mg bid, amlodipine  10 mg daily and hydralazine 100 mg TID. I would consider restarting losartan, but want to check with cardiology and nephrology regarding this.       Heart Failure with Preserved EF (HCC) - Primary    Decompensated with 35 lbs of weight gain in the past month. Notes some initial diuresis when switched to Bumex and metolazone. However, now he is back to gaining weight again (Up 4 lbs since last seen by cardiology last week). Dr. Vincent Gros had offered admission, but Mr. Nile declined due to concerns for his medical bills. He is scheduled to see Dr. Vincent Gros in 3 weeks. I have urged him to reconsider recommendation for hospitalization.  Genitourinary   Chronic kidney disease, stage 4 (severe) (HCC)    Last eGFR was < 15, consistent with Stage V CKD (kidney failure). Today's results are pending. Not scheduled to see nephrology for 10 weeks. I will reach out to nephrology with my concern for waiting that long.       Return in about 4 weeks (around 07/12/2023) for Reassessment.   [name], MD

## 2023-06-14 NOTE — Assessment & Plan Note (Signed)
Decompensated with 35 lbs of weight gain in the past month. Notes some initial diuresis when switched to Bumex and metolazone. However, now he is back to gaining weight again (Up 4 lbs since last seen by cardiology last week). Dr. Vincent Gros had offered admission, but Mr. Remer declined due to concerns for his medical bills. He is scheduled to see Dr. Vincent Gros in 3 weeks. I have urged him to reconsider recommendation for hospitalization.

## 2023-06-15 LAB — BASIC METABOLIC PANEL
BUN/Creatinine Ratio: 14 (ref 10–24)
BUN: 47 mg/dL — ABNORMAL HIGH (ref 8–27)
CO2: 18 mmol/L — ABNORMAL LOW (ref 20–29)
Calcium: 8.2 mg/dL — ABNORMAL LOW (ref 8.6–10.2)
Chloride: 101 mmol/L (ref 96–106)
Creatinine, Ser: 3.35 mg/dL — ABNORMAL HIGH (ref 0.76–1.27)
Glucose: 191 mg/dL — ABNORMAL HIGH (ref 70–99)
Potassium: 4.2 mmol/L (ref 3.5–5.2)
Sodium: 141 mmol/L (ref 134–144)
eGFR: 20 mL/min/{1.73_m2} — ABNORMAL LOW (ref >59)

## 2023-06-15 LAB — MAGNESIUM: Magnesium: 2.1 mg/dL (ref 1.6–2.3)

## 2023-06-18 ENCOUNTER — Ambulatory Visit: Payer: Medicare HMO | Admitting: Physician Assistant

## 2023-06-18 VITALS — BP 155/73 | HR 75 | Temp 98.2°F | Ht 69.0 in | Wt 344.6 lb

## 2023-06-18 DIAGNOSIS — I890 Lymphedema, not elsewhere classified: Secondary | ICD-10-CM | POA: Diagnosis not present

## 2023-06-18 DIAGNOSIS — I872 Venous insufficiency (chronic) (peripheral): Secondary | ICD-10-CM

## 2023-06-18 NOTE — Progress Notes (Signed)
Office Note     CC:  follow up Requesting Provider:  [name], MD  HPI: Peter Terry is a 67 y.o. (1955/10/28) male who presents for reevaluation of bilateral lower extremities with lymphedema.  Patient was last seen in the office on 05/18/2023 due to lymphedema affecting both legs.  Since office visit he has not noticed any changes with his symptoms.  He continues to wear compression on a regular basis.  He also elevates his legs during the day.  He tries to walk and be more active however is limited by his breathing and COPD and CHF.  He has no history of DVT.  He has been hospitalized for venous ulceration infections in the past.  He does not currently have any venous ulcers.  He is a former smoker.   Past Medical History:  Diagnosis Date   Acute heart failure with preserved ejection fraction (HFpEF) (HCC) 05/02/2023   CHF (congestive heart failure) (HCC)    Chronic kidney disease, stage 4 (severe) (HCC) 03/31/2023   COPD (chronic obstructive pulmonary disease) (HCC) 03/30/2023   Diabetic peripheral neuropathy (HCC) 03/31/2023   Dyspnea on exertion 05/02/2023   Edema of both lower legs 03/31/2023   Essential hypertension 03/30/2023   GERD (gastroesophageal reflux disease) 03/31/2023   Heart Failure with Preserved EF (HCC) 03/31/2023   High cholesterol    Hypertension    Hypomagnesemia 11/04/2022   Insulin long-term use (HCC) 08/21/2020   Iron deficiency anemia 03/31/2022   Mixed hyperlipidemia 03/30/2023   Morbid obesity with BMI of 45.0-49.9, adult (HCC) 07/25/2020   Multiple open wounds of lower extremity 05/02/2023   Obstructive sleep apnea 03/31/2023   Retinal tear 02/20/2013   Tinea pedis 03/31/2023   Type 2 diabetes mellitus with hyperglycemia, with long-term current use of insulin (HCC) 05/02/2023   Type 2 diabetes mellitus with stage 4 chronic kidney disease, with long-term current use of insulin (HCC) 11/04/2022   Venous stasis dermatitis 03/31/2023    Past  Surgical History:  Procedure Laterality Date   APPENDECTOMY     LUMBAR DISC SURGERY     L4-L5    Social History   Socioeconomic History   Marital status: Single    Spouse name: Not on file   Number of children: 1   Years of education: Not on file   Highest education level: GED or equivalent  Occupational History   Occupation: Air cabin crew Delivery  Tobacco Use   Smoking status: Former    Types: Cigarettes   Smokeless tobacco: Not on file  Vaping Use   Vaping status: Never Used  Substance and Sexual Activity   Alcohol use: No   Drug use: No   Sexual activity: Not Currently  Other Topics Concern   Not on file  Social History Narrative   Not on file   Social Determinants of Health   Financial Resource Strain: Low Risk  (05/26/2023)   Overall Financial Resource Strain (CARDIA)    Difficulty of Paying Living Expenses: Not very hard  Food Insecurity: Food Insecurity Present (05/26/2023)   Hunger Vital Sign    Worried About Radiation protection practitioner of Food in the Last Year: Sometimes true    Ran Out of Food in the Last Year: Sometimes true  Transportation Needs: No Transportation Needs (05/26/2023)   PRAPARE - Administrator, Civil Service (Medical): No    Lack of Transportation (Non-Medical): No  Physical Activity: Insufficiently Active (05/26/2023)   Exercise Vital Sign    Days of  Exercise per Week: 2 days    Minutes of Exercise per Session: 20 min  Stress: Stress Concern Present (05/26/2023)   [employer] of Occupational Health - Occupational Stress Questionnaire    Feeling of Stress : To some extent  Social Connections: Unknown (05/26/2023)   Social Connection and Isolation Panel [NHANES]    Frequency of Communication with Friends and Family: Twice a week    Frequency of Social Gatherings with Friends and Family: Never    Attends Religious Services: Never    Diplomatic Services operational officer: Patient declined    Attends Engineer, structural: Not  on file    Marital Status: Never married  Recent Concern: Social Connections - Socially Isolated (04/10/2023)   Social Connection and Isolation Panel [NHANES]    Frequency of Communication with Friends and Family: Once a week    Frequency of Social Gatherings with Friends and Family: Never    Attends Religious Services: Never    Diplomatic Services operational officer: No    Attends Engineer, structural: Not on file    Marital Status: Never married  Catering manager Violence: Not on file    Family History  Problem Relation Age of Onset   Cancer Mother    Diabetes Mother    Kidney disease Mother    Heart disease Father    Cancer Father        Colon   Kidney disease Father    Diabetes Father    Diabetes Brother    Heart disease Paternal Aunt     Current Outpatient Medications  Medication Sig Dispense Refill   albuterol (VENTOLIN HFA) 108 (90 Base) MCG/ACT inhaler Inhale 2 puffs into the lungs every 4 (four) hours as needed for wheezing or shortness of breath.     amLODipine (NORVASC) 10 MG tablet Take 1 tablet (10 mg total) by mouth every morning. 90 tablet 3   atorvastatin (LIPITOR) 80 MG tablet Take 80 mg by mouth at bedtime.     bumetanide (BUMEX) 2 MG tablet Take 1 tablet (2 mg total) by mouth daily. 180 tablet 3   carvedilol (COREG) 25 MG tablet Take 25 mg by mouth 2 (two) times daily.     ferrous sulfate 325 (65 FE) MG EC tablet Take 1 tablet (325 mg total) by mouth 3 (three) times daily with meals. 180 tablet 3   hydrALAZINE (APRESOLINE) 100 MG tablet Take 100 mg by mouth 3 (three) times daily.     Insulin Glargine (BASAGLAR KWIKPEN) 100 UNIT/ML Inject 35 Units into the skin 2 (two) times daily.     Insulin Lispro Prot & Lispro (HUMALOG MIX 75/25 KWIKPEN) (75-25) 100 UNIT/ML Kwikpen Inject 35 Units into the skin in the morning and at bedtime.     isosorbide mononitrate (IMDUR) 60 MG 24 hr tablet Take 60 mg by mouth daily.     metolazone (ZAROXOLYN) 5 MG tablet Take  1 tablet by mouth 30 minutes before taking your Bumex today 06/10/23. Take your next dose on Saturday 06/11/23, 30 minutes before taking your Bumex. DO NOT take any more pills after this dose until your next appointment 7 tablet 0   omeprazole (PRILOSEC) 20 MG capsule Take 1 capsule (20 mg total) by mouth daily. 90 capsule 3   potassium chloride (MICRO-K) 10 MEQ CR capsule Take 1 capsule (10 mEq total) by mouth daily. 90 capsule 3   No current facility-administered medications for this visit.    No Known Allergies  REVIEW OF SYSTEMS:   [X]  denotes positive finding, [ ]  denotes negative finding Cardiac  Comments:  Chest pain or chest pressure:    Shortness of breath upon exertion:    Short of breath when lying flat:    Irregular heart rhythm:        Vascular    Pain in calf, thigh, or hip brought on by ambulation:    Pain in feet at night that wakes you up from your sleep:     Blood clot in your veins:    Leg swelling:         Pulmonary    Oxygen at home:    Productive cough:     Wheezing:         Neurologic    Sudden weakness in arms or legs:     Sudden numbness in arms or legs:     Sudden onset of difficulty speaking or slurred speech:    Temporary loss of vision in one eye:     Problems with dizziness:         Gastrointestinal    Blood in stool:     Vomited blood:         Genitourinary    Burning when urinating:     Blood in urine:        Psychiatric    Major depression:         Hematologic    Bleeding problems:    Problems with blood clotting too easily:        Skin    Rashes or ulcers:        Constitutional    Fever or chills:      PHYSICAL EXAMINATION:  Vitals:   06/18/23 1002  BP: (!) 155/73  Pulse: 75  Temp: 98.2 F (36.8 C)  SpO2: 96%  Weight: (!) 344 lb 9.6 oz (156.3 kg)  Height: 5\' 9"  (1.753 m)    General:  WDWN in NAD; vital signs documented above Gait: Not observed HENT: WNL, normocephalic Pulmonary: normal non-labored breathing ,  without Rales, rhonchi,  wheezing Cardiac: regular HR Abdomen: soft, NT, no masses Skin: without rashes Vascular Exam/Pulses: Dopplerable DP pulses Extremities: Hyperkeratosis, hyperpigmentation, thickened skin of bilateral lower legs with edema of the feet and toes Musculoskeletal: no muscle wasting or atrophy  Neurologic: A&O X 3 Psychiatric:  The pt has Normal affect.         ASSESSMENT/PLAN:: 67 y.o. male here for follow up for reevaluation of bilateral lower extremities due to lymphedema  Mr. Peter Terry is a 67 year old male with known lymphedema bilateral lower extremities.  He currently is without any open wounds however previously was hospitalized for IV antibiotics to treat infections related to ulcerations of his lower legs.  He tries to wear compression on a regular basis and elevate his legs above the level of his heart.  He also tries to increase his activity level and mobility however is limited by his shortness of breath related to COPD and CHF.  I believe he will be best served with a referral to a lymphedema clinic to help manage his symptoms.  Nothing further to offer from a vascular surgery standpoint.  He will follow-up with [place] on an as-needed basis.   Emilie Rutter, PA-C [facility] [phone]  Clinic MD:   Hetty Blend

## 2023-07-01 DIAGNOSIS — I509 Heart failure, unspecified: Secondary | ICD-10-CM | POA: Insufficient documentation

## 2023-07-05 ENCOUNTER — Ambulatory Visit: Payer: Medicare HMO

## 2023-07-05 VITALS — BP 144/62 | HR 68 | Ht 69.0 in | Wt 343.0 lb

## 2023-07-05 DIAGNOSIS — I5031 Acute diastolic (congestive) heart failure: Secondary | ICD-10-CM

## 2023-07-05 DIAGNOSIS — E1165 Type 2 diabetes mellitus with hyperglycemia: Secondary | ICD-10-CM | POA: Diagnosis not present

## 2023-07-05 MED ORDER — METOLAZONE 2.5 MG PO TABS: 2.5000 mg | ORAL_TABLET | ORAL | 3 refills | Status: DC

## 2023-07-05 NOTE — Patient Instructions (Addendum)
Medication Instructions:   Your physician has recommended you make the following change in your medication:   Stop taking Metolazone 5 mg.  Start taking Metolazone 2.5 every other day.  *If you need a refill on your cardiac medications before your next appointment, please call your pharmacy*   Lab Work: Your physician recommends that you return for lab work in: 2 weeks for a BMP.  [facility] lab is located on the 3rd floor, [address]. Hours are M-F 8 am-4 pm, closed 11:30 am-1:00 pm. You do not need an appointment.  If you have labs (blood work) drawn today and your tests are completely normal, you will receive your results only by: MyChart Message (if you have MyChart) OR A paper copy in the mail If you have any lab test that is abnormal or we need to change your treatment, we will call you to review the results.   Testing/Procedures: None ordered   Follow-Up: At [facility], you and your health needs are our priority.  As part of our continuing mission to provide you with exceptional heart care, we have created designated Provider Care Teams.  These Care Teams include your primary Cardiologist (physician) and Advanced Practice Providers (APPs -  Physician Assistants and Nurse Practitioners) who all work together to provide you with the care you need, when you need it.  We recommend signing up for the patient portal called "MyChart".  Sign up information is provided on this After Visit Summary.  MyChart is used to connect with patients for Virtual Visits (Telemedicine).  Patients are able to view lab/test results, encounter notes, upcoming appointments, etc.  Non-urgent messages can be sent to your provider as well.   To learn more about what you can do with MyChart, go to [email or web address].    Your next appointment:   1 month(s)  Provider:   Huntley Dec, MD

## 2023-07-05 NOTE — Progress Notes (Signed)
Cardiology Consultation:    Date:  07/05/2023   ID:  Peter Terry, DOB Jun 23, 1956, MRN [id number]  PCP:  [name], MD  Cardiologist:  Marlyn Corporal Ella Golomb, MD   Referring MD: [name], MD  Nephrologist:Dr Nwobu; Recently had f/u with Dr Lequita Halt at [facility] on 05-18-2023   No chief complaint on file.    ASSESSMENT AND PLAN:   Mr. Beltre 67 year old male with history of chronic heart failure with preserved ejection fraction, morbid obesity, hypertension, diabetes mellitus, hyperlipidemia, CKD stage IV, obstructive sleep apnea intolerant to use CPAP, chronic venous insufficiency, bilateral lower extremity ulcers, was escalated on loop diuretics at last visit along with addition of metolazone for few days.  Now here for follow-up visit.  Problem List Items Addressed This Visit   None     History of Present Illness:    Peter Terry is a 67 y.o. male who is being seen today for follow-up visit, last visit was 06/10/2023.  Prior to that was seen for initial consult on 05/13/2023 PCP is Rudd, Bertram Millard, MD.   Has history of chronic heart failure with preserved ejection fraction, morbid obesity, diabetes mellitus, hypertension, CKD stage IV, obstructive sleep apnea intolerant to CPAP, bilateral chronic venous stasis with ulcers, hyperlipidemia.  Discharge weight from hospital on 05/10/2023 was 313 pounds. Last office visit his weight was 339 pounds. Today weight was 343 pounds.  At last visit with ongoing weight gain we recommended going to the ER which she declined. We bumped up the diuretic regimen to Bumex 2 mg twice daily and metolazone 5 mg every other day for 5 days after which to continue with Bumex. We restated salt and fluid restriction to be continued and stressed on importance to follow-up with nephrologist at an earlier time given his intolerance to higher doses of diuretics and potential need to be current for renal replacement  therapy.  He mentions he continue taking metolazone every other day for 2 weeks until the medications lasted.  He noted a weight drop by over 20 pounds and he was breathing much better.  Since he ran out of metolazone he gained all of that weight back over the last 2 weeks.  Denies any further change in his symptoms from baseline continues to be short of breath with minimal effort.  Denies any chest pain or palpitations at rest.  Continues to live with his 30 year old father at home.  His sister comes in to help with his dad.  Blood work done 06/14/2023 noted magnesium 2.1 BMP with sodium 141, potassium 4.2, BUN 47, creatinine 3.35, EGFR 20 This showed stable renal function.   Past Medical History:  Diagnosis Date   Acute heart failure with preserved ejection fraction (HFpEF) (HCC) 05/02/2023   CHF (congestive heart failure) (HCC)    Chronic kidney disease, stage 4 (severe) (HCC) 03/31/2023   COPD (chronic obstructive pulmonary disease) (HCC) 03/30/2023   Diabetic peripheral neuropathy (HCC) 03/31/2023   Dyspnea on exertion 05/02/2023   Edema of both lower legs 03/31/2023   Essential hypertension 03/30/2023   GERD (gastroesophageal reflux disease) 03/31/2023   Heart Failure with Preserved EF (HCC) 03/31/2023   Hypomagnesemia 11/04/2022   Insulin long-term use (HCC) 08/21/2020   Iron deficiency anemia 03/31/2022   Mixed hyperlipidemia 03/30/2023   Morbid obesity with BMI of 45.0-49.9, adult (HCC) 07/25/2020   Multiple open wounds of lower extremity 05/02/2023   Obstructive sleep apnea 03/31/2023   Retinal tear 02/20/2013  Tinea pedis 03/31/2023   Type 2 diabetes mellitus with hyperglycemia, with long-term current use of insulin (HCC) 05/02/2023   Type 2 diabetes mellitus with stage 4 chronic kidney disease, with long-term current use of insulin (HCC) 11/04/2022   Venous stasis dermatitis 03/31/2023    Past Surgical History:  Procedure Laterality Date   APPENDECTOMY     LUMBAR  DISC SURGERY     L4-L5    Current Medications: Current Meds  Medication Sig   albuterol (VENTOLIN HFA) 108 (90 Base) MCG/ACT inhaler Inhale 2 puffs into the lungs every 4 (four) hours as needed for wheezing or shortness of breath.   amLODipine (NORVASC) 10 MG tablet Take 1 tablet (10 mg total) by mouth every morning.   atorvastatin (LIPITOR) 80 MG tablet Take 80 mg by mouth at bedtime.   bumetanide (BUMEX) 2 MG tablet Take 1 tablet (2 mg total) by mouth daily.   carvedilol (COREG) 25 MG tablet Take 25 mg by mouth 2 (two) times daily.   ferrous sulfate 325 (65 FE) MG EC tablet Take 1 tablet (325 mg total) by mouth 3 (three) times daily with meals.   hydrALAZINE (APRESOLINE) 100 MG tablet Take 100 mg by mouth 3 (three) times daily.   Insulin Glargine (BASAGLAR KWIKPEN) 100 UNIT/ML Inject 35 Units into the skin 2 (two) times daily.   Insulin Lispro Prot & Lispro (HUMALOG MIX 75/25 KWIKPEN) (75-25) 100 UNIT/ML Kwikpen Inject 35 Units into the skin in the morning and at bedtime.   isosorbide mononitrate (IMDUR) 60 MG 24 hr tablet Take 60 mg by mouth daily.   metolazone (ZAROXOLYN) 5 MG tablet Take 1 tablet by mouth 30 minutes before taking your Bumex today 06/10/23. Take your next dose on Saturday 06/11/23, 30 minutes before taking your Bumex. DO NOT take any more pills after this dose until your next appointment   omeprazole (PRILOSEC) 20 MG capsule Take 1 capsule (20 mg total) by mouth daily.   potassium chloride (MICRO-K) 10 MEQ CR capsule Take 1 capsule (10 mEq total) by mouth daily.     Allergies:   Patient has no known allergies.   Social History   Socioeconomic History   Marital status: Single    Spouse name: Not on file   Number of children: 1   Years of education: Not on file   Highest education level: GED or equivalent  Occupational History   Occupation: Air cabin crew Delivery  Tobacco Use   Smoking status: Former    Types: Cigarettes   Smokeless tobacco: Not on file  Vaping Use    Vaping status: Never Used  Substance and Sexual Activity   Alcohol use: No   Drug use: No   Sexual activity: Not Currently  Other Topics Concern   Not on file  Social History Narrative   Not on file   Social Determinants of Health   Financial Resource Strain: Low Risk  (05/26/2023)   Overall Financial Resource Strain (CARDIA)    Difficulty of Paying Living Expenses: Not very hard  Food Insecurity: Food Insecurity Present (05/26/2023)   Hunger Vital Sign    Worried About Running Out of Food in the Last Year: Sometimes true    Ran Out of Food in the Last Year: Sometimes true  Transportation Needs: No Transportation Needs (05/26/2023)   PRAPARE - Administrator, Civil Service (Medical): No    Lack of Transportation (Non-Medical): No  Physical Activity: Insufficiently Active (05/26/2023)   Exercise Vital Sign  Days of Exercise per Week: 2 days    Minutes of Exercise per Session: 20 min  Stress: Stress Concern Present (05/26/2023)   [employer] of Occupational Health - Occupational Stress Questionnaire    Feeling of Stress : To some extent  Social Connections: Unknown (05/26/2023)   Social Connection and Isolation Panel [NHANES]    Frequency of Communication with Friends and Family: Twice a week    Frequency of Social Gatherings with Friends and Family: Never    Attends Religious Services: Never    Diplomatic Services operational officer: Patient declined    Attends Engineer, structural: Not on file    Marital Status: Never married  Recent Concern: Social Connections - Socially Isolated (04/10/2023)   Social Connection and Isolation Panel [NHANES]    Frequency of Communication with Friends and Family: Once a week    Frequency of Social Gatherings with Friends and Family: Never    Attends Religious Services: Never    Diplomatic Services operational officer: No    Attends Engineer, structural: Not on file    Marital Status: Never married      Family History: The patient's family history includes Cancer in his father and mother; Diabetes in his brother, father, and mother; Heart disease in his father and paternal aunt; Kidney disease in his father and mother. ROS:   Please see the history of present illness.    All 14 point review of systems negative except as described per history of present illness.  EKGs/Labs/Other Studies Reviewed:    The following studies were reviewed today:   EKG:       Recent Labs: 05/12/2023: ALT 15; Hemoglobin 8.8 Repeated and verified X2.; Platelets 249.0 06/14/2023: BUN 47; Creatinine, Ser 3.35; Magnesium 2.1; Potassium 4.2; Sodium 141  Recent Lipid Panel    Component Value Date/Time   CHOL 165 03/31/2023 1118   TRIG 220.0 (H) 03/31/2023 1118   HDL 30.50 (L) 03/31/2023 1118   CHOLHDL 5 03/31/2023 1118   VLDL 44.0 (H) 03/31/2023 1118   LDLDIRECT 106.0 03/31/2023 1118    Physical Exam:    VS:  BP (!) 150/66   Pulse 68   Ht 5\' 9"  (1.753 m)   Wt (!) 343 lb (155.6 kg)   SpO2 96%   BMI 50.65 kg/m     Wt Readings from Last 3 Encounters:  07/05/23 (!) 343 lb (155.6 kg)  06/18/23 (!) 344 lb 9.6 oz (156.3 kg)  06/14/23 (!) 343 lb 9.6 oz (155.9 kg)     GENERAL:  Well nourished, well developed in no acute distress NECK: No JVD; No carotid bruits CARDIAC: RRR, S1 and S2 present, no murmurs, no rubs, no gallops CHEST: Mild bilateral wheezing with reduced air entry.  Mild basilar crackles present. Extremities: Marked bilateral pedal edema extending up to the thighs.  Pulses bilaterally symmetric with radial 2+ and dorsalis pedis 1+ NEUROLOGIC:  Alert and oriented x 3  Medication Adjustments/Labs and Tests Ordered: Current medicines are reviewed at length with the patient today.  Concerns regarding medicines are outlined above.  No orders of the defined types were placed in this encounter.  No orders of the defined types were placed in this encounter.   Signed, Cecille Amsterdam, MD, MPH, [facility]. 07/05/2023 3:05 PM    [facility]

## 2023-07-05 NOTE — Assessment & Plan Note (Signed)
Hypervolemic, NYHA class III. Weight remains persistently elevated at 343 pounds today. Mention he did go down close to 20 pounds on the metolazone dose every other day for 2 weeks after his last visit with [place]. He has regained all that weight in the past 2 weeks.  Mentions he has been sticking with his dietary salt and fluid restrictions.  Advised him to continue with Bumex 2 mg once a day. Will start metolazone 2.5 mg to be taken every other day 30 minutes prior to his Bumex dose.  Will obtain BMP to be done in 2 weeks.  Follow-up with [place] in the office in 4 weeks. No other changes made to the medications today. Reviewed once again option to get admitted and inpatient IV diuretics to aggressively address his weight gain and revisit plan with nephrologist.  He declined. Advised him to go to the ER if his symptoms are acutely worsening.

## 2023-07-13 ENCOUNTER — Other Ambulatory Visit: Payer: Self-pay | Admitting: Family Medicine

## 2023-07-13 ENCOUNTER — Ambulatory Visit: Payer: Medicare HMO | Admitting: Family Medicine

## 2023-07-13 ENCOUNTER — Encounter: Payer: Self-pay | Admitting: Family Medicine

## 2023-07-13 VITALS — BP 162/80 | HR 70 | Temp 98.6°F | Ht 69.0 in | Wt 345.4 lb

## 2023-07-13 DIAGNOSIS — E1122 Type 2 diabetes mellitus with diabetic chronic kidney disease: Secondary | ICD-10-CM

## 2023-07-13 DIAGNOSIS — I5022 Chronic systolic (congestive) heart failure: Secondary | ICD-10-CM

## 2023-07-13 DIAGNOSIS — I872 Venous insufficiency (chronic) (peripheral): Secondary | ICD-10-CM

## 2023-07-13 DIAGNOSIS — I10 Essential (primary) hypertension: Secondary | ICD-10-CM

## 2023-07-13 DIAGNOSIS — Z6841 Body Mass Index (BMI) 40.0 and over, adult: Secondary | ICD-10-CM | POA: Diagnosis not present

## 2023-07-13 DIAGNOSIS — R600 Localized edema: Secondary | ICD-10-CM | POA: Diagnosis not present

## 2023-07-13 DIAGNOSIS — Z7984 Long term (current) use of oral hypoglycemic drugs: Secondary | ICD-10-CM

## 2023-07-13 DIAGNOSIS — L97221 Non-pressure chronic ulcer of left calf limited to breakdown of skin: Secondary | ICD-10-CM | POA: Diagnosis not present

## 2023-07-13 DIAGNOSIS — M25551 Pain in right hip: Secondary | ICD-10-CM

## 2023-07-13 DIAGNOSIS — G8929 Other chronic pain: Secondary | ICD-10-CM

## 2023-07-13 DIAGNOSIS — N184 Chronic kidney disease, stage 4 (severe): Secondary | ICD-10-CM

## 2023-07-13 DIAGNOSIS — Z794 Long term (current) use of insulin: Secondary | ICD-10-CM | POA: Diagnosis not present

## 2023-07-13 HISTORY — DX: Other chronic pain: G89.29

## 2023-07-13 HISTORY — DX: Venous insufficiency (chronic) (peripheral): I87.2

## 2023-07-13 MED ORDER — INSULIN LISPRO PROT & LISPRO (75-25 MIX) 100 UNIT/ML KWIKPEN
40.0000 [IU] | PEN_INJECTOR | Freq: Two times a day (BID) | SUBCUTANEOUS | 1 refills | Status: DC
Start: 2023-07-13 — End: 2023-07-13

## 2023-07-13 MED ORDER — TRAMADOL HCL 50 MG PO TABS
50.0000 mg | ORAL_TABLET | Freq: Three times a day (TID) | ORAL | 2 refills | Status: DC | PRN
Start: 2023-07-13 — End: 2023-12-22

## 2023-07-13 MED ORDER — BASAGLAR KWIKPEN 100 UNIT/ML ~~LOC~~ SOPN: 40.0000 [IU] | PEN_INJECTOR | Freq: Two times a day (BID) | SUBCUTANEOUS | 2 refills | Status: DC

## 2023-07-13 NOTE — Assessment & Plan Note (Signed)
Last eGFR was ~ 20, consistent with Stage 4 to 5 CKD (kidney failure). Nephrology follow-up pending for next month.

## 2023-07-13 NOTE — Assessment & Plan Note (Signed)
His current weight significantly impacts his health. I would strongly consider starting him on Mounjaro.

## 2023-07-13 NOTE — Assessment & Plan Note (Signed)
Blood pressure is high today. Continue carvedilol 25 mg bid, amlodipine 10 mg daily and hydralazine 100 mg TID. Patient is engaged with cardiology and nephrology. I will have him follow-up with both as scheduled.

## 2023-07-13 NOTE — Assessment & Plan Note (Signed)
Dressed wound. I encouraged him to do his best with routine wound care and keeping legs elevated.

## 2023-07-13 NOTE — Assessment & Plan Note (Signed)
The lower legs have significant skin changes. I cleaned the lower leg wounds today and redressed with Telfa pads, gauze, Coban, and ACE wraps. Vascular has referred him for lymphedema clinic. I encouraged him to follow through with this.

## 2023-07-13 NOTE — Assessment & Plan Note (Signed)
Metformin stopped in hospital due to concerns about CKD. Based on CGM data, I will increase his insulin glargine (Basaglar) insulin to 40 units twice daily and Humalog 75/25 to 40 units twice daily.

## 2023-07-13 NOTE — Assessment & Plan Note (Signed)
Likely multifactorial. Continue bumetanide and metolazone.

## 2023-07-13 NOTE — Assessment & Plan Note (Signed)
I cautioned him about NSAID use in light of his CHF. I will prescribe tramadol for pain management.

## 2023-07-13 NOTE — Assessment & Plan Note (Signed)
Partially compensated, but with some volume overload. He is working with cardiology and nephrology concerning management. Continue carvedilol 25 mg bid, bumetanide (Bumex) 2 mg daily, and metolazone 2.5 mg every other day.

## 2023-07-13 NOTE — Progress Notes (Signed)
[facility] [address] [place] [address] Dept: [phone] Dept Fax: [phone]  Chronic Care Office Visit  Subjective:    Patient ID: Peter Terry, male    DOB: 1955-11-05, 67 y.o..   MRN: [id number]  Chief Complaint  Patient presents with   Follow-up    4 week f/u, C/O Rt leg weeping again.    History of Present Illness:  Patient is in today for reassessment of chronic medical issues.  Peter Terry has a history of heart failure with preserved ejection fraction. His last admission for an acute exacerbation of his heart failure was in late Sept. He has had weight gain since discharge, but this has been stable over the past month. He is currently on carvedilol 25 mg bid and bumetanide (Bumex) 2 mg daily for heart failure management. He uses metolazone 2.5 mg every other day to assist with fluid retention/ peripheral edema. He is on amlodipine 10 mg daily and hydralazine 100 mg TID and for BP management. His losartan has been on hold since his discharge. Peter Terry also has chronic kidney disease with worsened GFR when using diuretics.  Peter Terry has chronic lower leg edema and chronic venous stasis changes. He notes he has had drainage on the right lower leg, so is back to putting dressings on this. He struggles with caring for this due to an inability to reach his legs well.  Peter Terry continues to have some right hip pain, which esp. gives him difficulty getting up. He admits that he has used some ibuprofen when he can't tolerate the pain any longer.   Peter Terry has a Type 2 diabetes diagnosed 20+ years ago. He is currently managed on metformin 1,000 mg bid, Basaglar insulin 35 units twice daily and Humalog 75/25 35 units twice daily.  His CGM shows a 14-day average blood sugar of 224.  Past Medical History: Patient Active Problem List   Diagnosis Date Noted   CHF (congestive heart failure) (HCC)    Acute heart failure with  preserved ejection fraction (HFpEF) (HCC) 05/02/2023   Dyspnea on exertion 05/02/2023   Multiple open wounds of lower extremity 05/02/2023   Type 2 diabetes mellitus with hyperglycemia, with long-term current use of insulin (HCC) 05/02/2023   Chronic kidney disease, stage 4 (severe) (HCC) 03/31/2023   Obstructive sleep apnea 03/31/2023   GERD (gastroesophageal reflux disease) 03/31/2023   Edema of both lower legs 03/31/2023   Venous stasis dermatitis 03/31/2023   Tinea pedis 03/31/2023   Heart Failure with Preserved EF (HCC) 03/31/2023   Diabetic peripheral neuropathy (HCC) 03/31/2023   Mixed hyperlipidemia 03/30/2023   Essential hypertension 03/30/2023   COPD (chronic obstructive pulmonary disease) (HCC) 03/30/2023   Type 2 diabetes mellitus with stage 4 chronic kidney disease, with long-term current use of insulin (HCC) 11/04/2022   Hypomagnesemia 11/04/2022   Iron deficiency anemia 03/31/2022   Insulin long-term use (HCC) 08/21/2020   Morbid obesity with body mass index (BMI) of 50.0 to 59.9 in adult [facility]) 07/25/2020   Retinal tear 02/20/2013   Past Surgical History:  Procedure Laterality Date   APPENDECTOMY     LUMBAR DISC SURGERY     L4-L5   Family History  Problem Relation Age of Onset   Cancer Mother    Diabetes Mother    Kidney disease Mother    Heart disease Father    Cancer Father        Colon   Kidney disease Father  Diabetes Father    Diabetes Brother    Heart disease Paternal Aunt    Outpatient Medications Prior to Visit  Medication Sig Dispense Refill   albuterol (VENTOLIN HFA) 108 (90 Base) MCG/ACT inhaler Inhale 2 puffs into the lungs every 4 (four) hours as needed for wheezing or shortness of breath.     amLODipine (NORVASC) 10 MG tablet Take 1 tablet (10 mg total) by mouth every morning. 90 tablet 3   atorvastatin (LIPITOR) 80 MG tablet Take 80 mg by mouth at bedtime.     bumetanide (BUMEX) 2 MG tablet Take 1 tablet (2 mg total) by mouth daily. 180  tablet 3   carvedilol (COREG) 25 MG tablet Take 25 mg by mouth 2 (two) times daily.     ferrous sulfate 325 (65 FE) MG EC tablet Take 1 tablet (325 mg total) by mouth 3 (three) times daily with meals. 180 tablet 3   hydrALAZINE (APRESOLINE) 100 MG tablet Take 100 mg by mouth 3 (three) times daily.     isosorbide mononitrate (IMDUR) 60 MG 24 hr tablet Take 60 mg by mouth daily.     losartan (COZAAR) 100 MG tablet Take 100 mg by mouth daily.     metolazone (ZAROXOLYN) 2.5 MG tablet Take 1 tablet (2.5 mg total) by mouth every other day. 45 tablet 3   omeprazole (PRILOSEC) 20 MG capsule Take 1 capsule (20 mg total) by mouth daily. 90 capsule 3   potassium chloride (MICRO-K) 10 MEQ CR capsule Take 1 capsule (10 mEq total) by mouth daily. 90 capsule 3   Insulin Glargine (BASAGLAR KWIKPEN) 100 UNIT/ML Inject 35 Units into the skin 2 (two) times daily.     Insulin Lispro Prot & Lispro (HUMALOG MIX 75/25 KWIKPEN) (75-25) 100 UNIT/ML Kwikpen Inject 35 Units into the skin in the morning and at bedtime.     No facility-administered medications prior to visit.   No Known Allergies Objective:   Today's Vitals   07/13/23 1320  BP: (!) 162/80  Pulse: 70  Temp: 98.6 F (37 C)  TempSrc: Temporal  SpO2: 98%  Weight: (!) 345 lb 6.4 oz (156.7 kg)  Height: 5\' 9"  (1.753 m)   Body mass index is 51.01 kg/m.   General: Well developed, well nourished. No acute distress. Lungs: Clear to auscultation bilaterally. No wheezing, rales or rhonchi. Extremities: There is 4+ woody edema of both lower legs with chronic venous stasis changes of the skin. There are   two superficial ulcerations to the posterior left calf. The right lower calf ahs some mild serous drainage, but no open  wounds. Psych: Alert and oriented. Normal mood and affect.  Health Maintenance Due  Topic Date Due   OPHTHALMOLOGY EXAM  Never done   HIV Screening  Never done   Hepatitis C Screening  Never done   DTaP/Tdap/Td (1 - Tdap) Never  done   Zoster Vaccines- Shingrix (1 of 2) Never done     Assessment & Plan:   Problem List Items Addressed This Visit       Cardiovascular and Mediastinum   Essential hypertension    Blood pressure is high today. Continue carvedilol 25 mg bid, amlodipine 10 mg daily and hydralazine 100 mg TID. Patient is engaged with cardiology and nephrology. I will have him follow-up with both as scheduled.      Relevant Medications   losartan (COZAAR) 100 MG tablet   Heart Failure with Preserved EF (HCC) - Primary    Partially compensated, but  with some volume overload. He is working with cardiology and nephrology concerning management. Continue carvedilol 25 mg bid, bumetanide (Bumex) 2 mg daily, and metolazone 2.5 mg every other day.      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 Based on CGM data, I will increase his insulin glargine (Basaglar) insulin to 40 units twice daily and Humalog 75/25 to 40 units twice daily.      Relevant Medications   losartan (COZAAR) 100 MG tablet   Insulin Glargine (BASAGLAR KWIKPEN) 100 UNIT/ML   Insulin Lispro Prot & Lispro (HUMALOG MIX 75/25 KWIKPEN) (75-25) 100 UNIT/ML Kwikpen     Musculoskeletal and Integument   Venous stasis dermatitis    The lower legs have significant skin changes. I cleaned the lower leg wounds today and redressed with Telfa pads, gauze, Coban, and ACE wraps. Vascular has referred him for lymphedema clinic. I encouraged him to follow through with this.      Venous stasis ulcer of left calf limited to breakdown of skin without varicose veins (HCC)    Dressed wound. I encouraged him to do his best with routine wound care and keeping legs elevated.        Genitourinary   Chronic kidney disease, stage 4 (severe) (HCC)    Last eGFR was ~ 20,  consistent with Stage 4 to 5 CKD (kidney failure). Nephrology follow-up pending for next month.        Other   Chronic right hip pain    I cautioned him about NSAID use in light of his CHF. I will prescribe tramadol for pain management.      Relevant Medications   traMADol (ULTRAM) 50 MG tablet   Edema of both lower legs    Likely multifactorial. Continue bumetanide and metolazone.      Morbid obesity with body mass index (BMI) of 50.0 to 59.9 in adult [facility])    His current weight significantly impacts his health. I would strongly consider starting him on Mounjaro.      Relevant Medications   Insulin Glargine (BASAGLAR KWIKPEN) 100 UNIT/ML   Insulin Lispro Prot & Lispro (HUMALOG MIX 75/25 KWIKPEN) (75-25) 100 UNIT/ML Kwikpen    Return in about 2 months (around 09/13/2023).   [name], MD

## 2023-07-25 ENCOUNTER — Other Ambulatory Visit: Payer: Self-pay | Admitting: Family Medicine

## 2023-08-04 DIAGNOSIS — E1165 Type 2 diabetes mellitus with hyperglycemia: Secondary | ICD-10-CM | POA: Diagnosis not present

## 2023-08-06 ENCOUNTER — Telehealth: Payer: Self-pay | Admitting: Family Medicine

## 2023-08-06 NOTE — Telephone Encounter (Unsigned)
Copied from CRM [id number]. Topic: Clinical - Home Health Verbal Orders >> Aug 06, 2023  3:58 PM Steele Sizer wrote: Caller/Agency: Merita Norton from [facility]  Callback Number: [phone] Service Requested: Diabetic Supplies  Frequency: N/A Any new concerns about the patient? No  Merita Norton would like a callback to regarding the the status for the equipment

## 2023-08-09 NOTE — Telephone Encounter (Signed)
Form signed and faxed to [phone]. Dm/cma

## 2023-08-09 NOTE — Telephone Encounter (Signed)
Form just reached my desk.  Will have provider review/sign and then get faxed back if appropriate. Dm/cma

## 2023-08-23 ENCOUNTER — Ambulatory Visit: Payer: Medicare HMO

## 2023-08-23 VITALS — BP 126/62 | HR 76 | Ht 69.6 in | Wt 337.1 lb

## 2023-08-23 DIAGNOSIS — I5032 Chronic diastolic (congestive) heart failure: Secondary | ICD-10-CM

## 2023-08-23 NOTE — Patient Instructions (Signed)
 Medication Instructions:  Your physician recommends that you continue on your current medications as directed. Please refer to the Current Medication list given to you today.  *If you need a refill on your cardiac medications before your next appointment, please call your pharmacy*   Lab Work: None Ordered If you have labs (blood work) drawn today and your tests are completely normal, you will receive your results only by: MyChart Message (if you have MyChart) OR A paper copy in the mail If you have any lab test that is abnormal or we need to change your treatment, we will call you to review the results.   Testing/Procedures: None Ordered   Follow-Up: At [facility], you and your health needs are our priority.  As part of our continuing mission to provide you with exceptional heart care, we have created designated Provider Care Teams.  These Care Teams include your primary Cardiologist (physician) and Advanced Practice Providers (APPs -  Physician Assistants and Nurse Practitioners) who all work together to provide you with the care you need, when you need it.  We recommend signing up for the patient portal called "MyChart".  Sign up information is provided on this After Visit Summary.  MyChart is used to connect with patients for Virtual Visits (Telemedicine).  Patients are able to view lab/test results, encounter notes, upcoming appointments, etc.  Non-urgent messages can be sent to your provider as well.   To learn more about what you can do with MyChart, go to [email or web address].    Your next appointment:   2 month follow up

## 2023-08-23 NOTE — Progress Notes (Signed)
 Cardiology Consultation:    Date:  08/23/2023   ID:  Peter Terry, DOB 1956/03/30, MRN [id number]  PCP:  Thedora Garnette HERO, MD  Cardiologist:  Alean SAUNDERS Nahjae Hoeg, MD   Referring MD: Thedora Garnette HERO, MD  Nephrologist:Dr Lufadeju at [facility]   No chief complaint on file.    ASSESSMENT AND PLAN:   [name] 68 year old with history of chronic diastolic heart failure with preserved ejection fraction, morbid obesity, hypertension, diabetes mellitus, hyperlipidemia, CKD stage IV, obstructive sleep apnea unable to tolerate CPAP, chronic venous insufficiency with bilateral lower extremit y edema with difficult fluid status management in the setting of CKD, and escalating need for diuretic therapy.  Problem List Items Addressed This Visit     CHF (congestive heart failure) (HCC) - Primary   Hypervolemic, NYHA class III.  Mentions he has been sticking with his dietary salt and fluid restrictions. Has been difficult for fluid management, slow but downward trend of weight is somewhat encouraging.  Advised him to continue with Bumex  2 mg once a day. Continue metolazone  2.5 mg to be taken every other day 30 minutes prior to his Bumex  dose.   Follow-up with us  in the office in 6-8 weeks. Advised him to go to the ER if his symptoms are acutely worsening.  If he continues to remain with weight about 330 pounds, may benefit with inpatient diuresis with IV loop diuretics to bring him back to his ideal fluid status prior to discharge.        History of Present Illness:    Peter Terry is a 68 y.o. male who is being seen today for follow-up visit. PCP is Rudd, Garnette HERO, MD. Next visit with nephrologist is pending for January 23 Last visit with us  in the office was 07/05/2023.  History of chronic diastolic heart failure with preserved ejection fraction, morbid obesity, hypertension, diabetes mellitus, hyperlipidemia, CKD stage IV, obstructive sleep apnea  intolerant to CPAP, chronic venous insufficiency with bilateral lower extremity ulcers.  Pleasant gentleman here for the visit by himself.  Lives with his father who is in his 33s. Has been delivering newspapers for job.  His ideal dry weight appears to be close to 313 pounds. Last office visit on 07/05/2023 weight was 343 pounds. Today weight is down to 337 pounds.  I recommended him to switch metolazone  dose to 2.5 mg every other day and continue with Bumex  2 mg daily.  He has been doing this.  Mentions his weight has come down a little bit. Continues to be out of breath with minimal effort.  Denies chest pain.  Denies palpitations.  Denies syncopal episodes. Bilateral lower extremity edema which he thinks is slightly worse than before.  Blood work BMP from 07/05/2023 not completed. Advised him to get blood work completed.    Past Medical History:  Diagnosis Date   Acute heart failure with preserved ejection fraction (HFpEF) (HCC) 05/02/2023   CHF (congestive heart failure) (HCC)    Chronic kidney disease, stage 4 (severe) (HCC) 03/31/2023   Chronic right hip pain 07/13/2023   COPD (chronic obstructive pulmonary disease) (HCC) 03/30/2023   Diabetic peripheral neuropathy (HCC) 03/31/2023   Dyspnea on exertion 05/02/2023   Edema of both lower legs 03/31/2023   Essential hypertension 03/30/2023   GERD (gastroesophageal reflux disease) 03/31/2023   Heart Failure with Preserved EF (HCC) 03/31/2023   Hypomagnesemia 11/04/2022   Insulin  long-term use (HCC) 08/21/2020   Iron  deficiency anemia 03/31/2022   Mixed  hyperlipidemia 03/30/2023   Morbid obesity with BMI of 45.0-49.9, adult (HCC) 07/25/2020   Morbid obesity with body mass index (BMI) of 50.0 to 59.9 in adult [facility]) 07/25/2020   Multiple open wounds of lower extremity 05/02/2023   Obstructive sleep apnea 03/31/2023   Retinal tear 02/20/2013   Tinea pedis 03/31/2023   Type 2 diabetes mellitus with hyperglycemia, with  long-term current use of insulin  (HCC) 05/02/2023   Type 2 diabetes mellitus with stage 4 chronic kidney disease, with long-term current use of insulin  (HCC) 11/04/2022   Venous stasis dermatitis 03/31/2023   Venous stasis ulcer of left calf limited to breakdown of skin without varicose veins (HCC) 07/13/2023    Past Surgical History:  Procedure Laterality Date   APPENDECTOMY     LUMBAR DISC SURGERY     L4-L5    Current Medications: Current Meds  Medication Sig   albuterol  (VENTOLIN  HFA) 108 (90 Base) MCG/ACT inhaler Inhale 2 puffs into the lungs every 4 (four) hours as needed for wheezing or shortness of breath.   amLODipine  (NORVASC ) 10 MG tablet Take 1 tablet (10 mg total) by mouth every morning.   atorvastatin  (LIPITOR) 80 MG tablet Take 80 mg by mouth at bedtime.   bumetanide  (BUMEX ) 2 MG tablet Take 1 tablet (2 mg total) by mouth daily.   carvedilol  (COREG ) 25 MG tablet Take 25 mg by mouth 2 (two) times daily.   ferrous sulfate  325 (65 FE) MG EC tablet Take 1 tablet (325 mg total) by mouth 3 (three) times daily with meals.   hydrALAZINE  (APRESOLINE ) 100 MG tablet Take 100 mg by mouth 3 (three) times daily.   insulin  aspart protamine - aspart (NOVOLOG  MIX 70/30 FLEXPEN) (70-30) 100 UNIT/ML FlexPen Inject 40 Units into the skin 2 (two) times daily with a meal.   Insulin  Glargine (BASAGLAR  KWIKPEN) 100 UNIT/ML INJECT 40 UNITS INTO THE SKIN TWICE A DAY   isosorbide  mononitrate (IMDUR ) 60 MG 24 hr tablet Take 60 mg by mouth daily.   losartan  (COZAAR ) 100 MG tablet Take 100 mg by mouth daily.   metolazone  (ZAROXOLYN ) 2.5 MG tablet Take 1 tablet (2.5 mg total) by mouth every other day.   omeprazole  (PRILOSEC) 20 MG capsule Take 1 capsule (20 mg total) by mouth daily.   potassium chloride  (MICRO-K ) 10 MEQ CR capsule Take 1 capsule (10 mEq total) by mouth daily.   traMADol  (ULTRAM ) 50 MG tablet Take 1 tablet (50 mg total) by mouth every 8 (eight) hours as needed.     Allergies:    Patient has no known allergies.   Social History   Socioeconomic History   Marital status: Single    Spouse name: Not on file   Number of children: 1   Years of education: Not on file   Highest education level: GED or equivalent  Occupational History   Occupation: [employer]  Tobacco Use   Smoking status: Former    Types: Cigarettes   Smokeless tobacco: Not on file  Vaping Use   Vaping status: Never Used  Substance and Sexual Activity   Alcohol use: No   Drug use: No   Sexual activity: Not Currently  Other Topics Concern   Not on file  Social History Narrative   Not on file   Social Drivers of Health   Financial Resource Strain: Low Risk  (05/26/2023)   Overall Financial Resource Strain (CARDIA)    Difficulty of Paying Living Expenses: Not very hard  Food Insecurity: Food Insecurity Present (  05/26/2023)   Hunger Vital Sign    Worried About Running Out of Food in the Last Year: Sometimes true    Ran Out of Food in the Last Year: Sometimes true  Transportation Needs: No Transportation Needs (05/26/2023)   PRAPARE - Administrator, Civil Service (Medical): No    Lack of Transportation (Non-Medical): No  Physical Activity: Insufficiently Active (05/26/2023)   Exercise Vital Sign    Days of Exercise per Week: 2 days    Minutes of Exercise per Session: 20 min  Stress: Stress Concern Present (05/26/2023)   [employer] of Occupational Health - Occupational Stress Questionnaire    Feeling of Stress : To some extent  Social Connections: Unknown (05/26/2023)   Social Connection and Isolation Panel [NHANES]    Frequency of Communication with Friends and Family: Twice a week    Frequency of Social Gatherings with Friends and Family: Never    Attends Religious Services: Never    Diplomatic Services Operational Officer: Patient declined    Attends Engineer, Structural: Not on file    Marital Status: Never married  Recent Concern: Social  Connections - Socially Isolated (04/10/2023)   Social Connection and Isolation Panel [NHANES]    Frequency of Communication with Friends and Family: Once a week    Frequency of Social Gatherings with Friends and Family: Never    Attends Religious Services: Never    Diplomatic Services Operational Officer: No    Attends Engineer, Structural: Not on file    Marital Status: Never married     Family History: The patient's family history includes Cancer in his father and mother; Diabetes in his brother, father, and mother; Heart disease in his father and paternal aunt; Kidney disease in his father and mother. ROS:   Please see the history of present illness.    All 14 point review of systems negative except as described per history of present illness.  EKGs/Labs/Other Studies Reviewed:    The following studies were reviewed today:   EKG:       Recent Labs: 05/12/2023: ALT 15; Hemoglobin 8.8 Repeated and verified X2.; Platelets 249.0 06/14/2023: BUN 47; Creatinine, Ser 3.35; Magnesium 2.1; Potassium 4.2; Sodium 141  Recent Lipid Panel    Component Value Date/Time   CHOL 165 03/31/2023 1118   TRIG 220.0 (H) 03/31/2023 1118   HDL 30.50 (L) 03/31/2023 1118   CHOLHDL 5 03/31/2023 1118   VLDL 44.0 (H) 03/31/2023 1118   LDLDIRECT 106.0 03/31/2023 1118    Physical Exam:    VS:  BP 126/62   Pulse 76   Ht 5' 9.6 (1.768 m)   Wt (!) 337 lb 1.3 oz (152.9 kg)   SpO2 97%   BMI 48.92 kg/m     Wt Readings from Last 3 Encounters:  08/23/23 (!) 337 lb 1.3 oz (152.9 kg)  07/13/23 (!) 345 lb 6.4 oz (156.7 kg)  07/05/23 (!) 343 lb (155.6 kg)     GENERAL:  Well nourished, well developed in no acute distress NECK: No JVD; No carotid bruits CARDIAC: RRR, S1 and S2 present, no murmurs, no rubs, no gallops CHEST:  Clear to auscultation without rales, wheezing or rhonchi  ABDOMEN: Soft, non-tender, non-distended Extremities: No pitting pedal edema. Pulses bilaterally symmetric with  radial 2+ and dorsalis pedis 2+ NEUROLOGIC:  Alert and oriented x 3  Medication Adjustments/Labs and Tests Ordered: Current medicines are reviewed at length with the patient today.  Concerns  regarding medicines are outlined above.  No orders of the defined types were placed in this encounter.  No orders of the defined types were placed in this encounter.   Signed, Alean jess Kobus, MD, MPH, [facility]. 08/23/2023 2:08 PM    [facility]

## 2023-08-23 NOTE — Assessment & Plan Note (Addendum)
 Hypervolemic, NYHA class III.  Mentions he has been sticking with his dietary salt and fluid restrictions. Has been difficult for fluid management, slow but downward trend of weight is somewhat encouraging.  Advised him to continue with Bumex  2 mg once a day. Continue metolazone  2.5 mg to be taken every other day 30 minutes prior to his Bumex  dose.   Follow-up with us  in the office in 6-8 weeks. Advised him to go to the ER if his symptoms are acutely worsening.  If he continues to remain with weight about 330 pounds, may benefit with inpatient diuresis with IV loop diuretics to bring him back to his ideal fluid status prior to discharge.

## 2023-08-25 ENCOUNTER — Encounter (HOSPITAL_BASED_OUTPATIENT_CLINIC_OR_DEPARTMENT_OTHER): Payer: Self-pay | Admitting: Emergency Medicine

## 2023-08-25 ENCOUNTER — Inpatient Hospital Stay (HOSPITAL_BASED_OUTPATIENT_CLINIC_OR_DEPARTMENT_OTHER)
Admission: EM | Admit: 2023-08-25 | Discharge: 2023-09-03 | DRG: 291 | Disposition: A | Discharge status: Home / Self Care | Payer: Medicare HMO | Attending: Internal Medicine | Admitting: Internal Medicine

## 2023-08-25 ENCOUNTER — Other Ambulatory Visit: Payer: Self-pay

## 2023-08-25 ENCOUNTER — Emergency Department (HOSPITAL_BASED_OUTPATIENT_CLINIC_OR_DEPARTMENT_OTHER): Payer: Medicare HMO

## 2023-08-25 DIAGNOSIS — Z87891 Personal history of nicotine dependence: Secondary | ICD-10-CM

## 2023-08-25 DIAGNOSIS — Z8249 Family history of ischemic heart disease and other diseases of the circulatory system: Secondary | ICD-10-CM

## 2023-08-25 DIAGNOSIS — E1140 Type 2 diabetes mellitus with diabetic neuropathy, unspecified: Secondary | ICD-10-CM

## 2023-08-25 DIAGNOSIS — R21 Rash and other nonspecific skin eruption: Secondary | ICD-10-CM | POA: Diagnosis present

## 2023-08-25 DIAGNOSIS — I10 Essential (primary) hypertension: Secondary | ICD-10-CM | POA: Diagnosis present

## 2023-08-25 DIAGNOSIS — D631 Anemia in chronic kidney disease: Secondary | ICD-10-CM | POA: Diagnosis present

## 2023-08-25 DIAGNOSIS — E8809 Other disorders of plasma-protein metabolism, not elsewhere classified: Secondary | ICD-10-CM | POA: Diagnosis present

## 2023-08-25 DIAGNOSIS — R000 Tachycardia, unspecified: Secondary | ICD-10-CM | POA: Diagnosis present

## 2023-08-25 DIAGNOSIS — I509 Heart failure, unspecified: Secondary | ICD-10-CM | POA: Diagnosis not present

## 2023-08-25 DIAGNOSIS — I2510 Atherosclerotic heart disease of native coronary artery without angina pectoris: Secondary | ICD-10-CM | POA: Diagnosis present

## 2023-08-25 DIAGNOSIS — R0602 Shortness of breath: Secondary | ICD-10-CM | POA: Diagnosis not present

## 2023-08-25 DIAGNOSIS — E1142 Type 2 diabetes mellitus with diabetic polyneuropathy: Secondary | ICD-10-CM | POA: Diagnosis present

## 2023-08-25 DIAGNOSIS — K5900 Constipation, unspecified: Secondary | ICD-10-CM | POA: Diagnosis present

## 2023-08-25 DIAGNOSIS — I5033 Acute on chronic diastolic (congestive) heart failure: Secondary | ICD-10-CM

## 2023-08-25 DIAGNOSIS — I130 Hypertensive heart and chronic kidney disease with heart failure and stage 1 through stage 4 chronic kidney disease, or unspecified chronic kidney disease: Secondary | ICD-10-CM | POA: Diagnosis not present

## 2023-08-25 DIAGNOSIS — G4733 Obstructive sleep apnea (adult) (pediatric): Secondary | ICD-10-CM | POA: Diagnosis present

## 2023-08-25 DIAGNOSIS — I110 Hypertensive heart disease with heart failure: Secondary | ICD-10-CM | POA: Diagnosis not present

## 2023-08-25 DIAGNOSIS — E66813 Obesity, class 3: Secondary | ICD-10-CM

## 2023-08-25 DIAGNOSIS — R0989 Other specified symptoms and signs involving the circulatory and respiratory systems: Secondary | ICD-10-CM | POA: Diagnosis not present

## 2023-08-25 DIAGNOSIS — Z1152 Encounter for screening for COVID-19: Secondary | ICD-10-CM

## 2023-08-25 DIAGNOSIS — D509 Iron deficiency anemia, unspecified: Secondary | ICD-10-CM | POA: Diagnosis present

## 2023-08-25 DIAGNOSIS — G8929 Other chronic pain: Secondary | ICD-10-CM | POA: Diagnosis present

## 2023-08-25 DIAGNOSIS — Z6841 Body Mass Index (BMI) 40.0 and over, adult: Secondary | ICD-10-CM

## 2023-08-25 DIAGNOSIS — L859 Epidermal thickening, unspecified: Secondary | ICD-10-CM | POA: Diagnosis present

## 2023-08-25 DIAGNOSIS — K219 Gastro-esophageal reflux disease without esophagitis: Secondary | ICD-10-CM | POA: Diagnosis present

## 2023-08-25 DIAGNOSIS — E1165 Type 2 diabetes mellitus with hyperglycemia: Secondary | ICD-10-CM | POA: Diagnosis present

## 2023-08-25 DIAGNOSIS — Z833 Family history of diabetes mellitus: Secondary | ICD-10-CM

## 2023-08-25 DIAGNOSIS — E785 Hyperlipidemia, unspecified: Secondary | ICD-10-CM

## 2023-08-25 DIAGNOSIS — Z79899 Other long term (current) drug therapy: Secondary | ICD-10-CM

## 2023-08-25 DIAGNOSIS — N179 Acute kidney failure, unspecified: Secondary | ICD-10-CM

## 2023-08-25 DIAGNOSIS — D638 Anemia in other chronic diseases classified elsewhere: Secondary | ICD-10-CM

## 2023-08-25 DIAGNOSIS — Z841 Family history of disorders of kidney and ureter: Secondary | ICD-10-CM

## 2023-08-25 DIAGNOSIS — E876 Hypokalemia: Secondary | ICD-10-CM | POA: Diagnosis not present

## 2023-08-25 DIAGNOSIS — M25551 Pain in right hip: Secondary | ICD-10-CM | POA: Diagnosis present

## 2023-08-25 DIAGNOSIS — I700 Atherosclerosis of aorta: Secondary | ICD-10-CM | POA: Diagnosis not present

## 2023-08-25 DIAGNOSIS — N184 Chronic kidney disease, stage 4 (severe): Secondary | ICD-10-CM

## 2023-08-25 DIAGNOSIS — E782 Mixed hyperlipidemia: Secondary | ICD-10-CM | POA: Diagnosis present

## 2023-08-25 DIAGNOSIS — I517 Cardiomegaly: Secondary | ICD-10-CM | POA: Diagnosis not present

## 2023-08-25 DIAGNOSIS — E1169 Type 2 diabetes mellitus with other specified complication: Secondary | ICD-10-CM | POA: Diagnosis present

## 2023-08-25 DIAGNOSIS — L03115 Cellulitis of right lower limb: Secondary | ICD-10-CM

## 2023-08-25 DIAGNOSIS — E1151 Type 2 diabetes mellitus with diabetic peripheral angiopathy without gangrene: Secondary | ICD-10-CM | POA: Diagnosis present

## 2023-08-25 DIAGNOSIS — E1122 Type 2 diabetes mellitus with diabetic chronic kidney disease: Secondary | ICD-10-CM

## 2023-08-25 DIAGNOSIS — I872 Venous insufficiency (chronic) (peripheral): Secondary | ICD-10-CM | POA: Diagnosis present

## 2023-08-25 DIAGNOSIS — J449 Chronic obstructive pulmonary disease, unspecified: Secondary | ICD-10-CM | POA: Diagnosis present

## 2023-08-25 DIAGNOSIS — Z794 Long term (current) use of insulin: Secondary | ICD-10-CM

## 2023-08-25 HISTORY — DX: Acute on chronic diastolic (congestive) heart failure: I50.33

## 2023-08-25 LAB — URINALYSIS, ROUTINE W REFLEX MICROSCOPIC
Bilirubin Urine: NEGATIVE
Glucose, UA: 100 mg/dL — AB
Ketones, ur: NEGATIVE mg/dL
Leukocytes,Ua: NEGATIVE
Nitrite: NEGATIVE
Protein, ur: >=300 mg/dL — AB
Specific Gravity, Urine: 1.015 (ref 1.005–1.030)
pH: 6.5 (ref 5.0–8.0)

## 2023-08-25 LAB — CBC WITH DIFFERENTIAL/PLATELET
Abs Immature Granulocytes: 0.13 10*3/uL — ABNORMAL HIGH (ref 0.00–0.07)
Basophils Absolute: 0.1 10*3/uL (ref 0.0–0.1)
Basophils Relative: 0 %
Eosinophils Absolute: 0 10*3/uL (ref 0.0–0.5)
Eosinophils Relative: 0 %
HCT: 28.5 % — ABNORMAL LOW (ref 39.0–52.0)
Hemoglobin: 9.2 g/dL — ABNORMAL LOW (ref 13.0–17.0)
Immature Granulocytes: 1 %
Lymphocytes Relative: 4 %
Lymphs Abs: 0.8 10*3/uL (ref 0.7–4.0)
MCH: 24.3 pg — ABNORMAL LOW (ref 26.0–34.0)
MCHC: 32.3 g/dL (ref 30.0–36.0)
MCV: 75.4 fL — ABNORMAL LOW (ref 80.0–100.0)
Monocytes Absolute: 0.6 10*3/uL (ref 0.1–1.0)
Monocytes Relative: 3 %
Neutro Abs: 17.3 10*3/uL — ABNORMAL HIGH (ref 1.7–7.7)
Neutrophils Relative %: 92 %
Platelets: 214 10*3/uL (ref 150–400)
RBC: 3.78 MIL/uL — ABNORMAL LOW (ref 4.22–5.81)
RDW: 16.3 % — ABNORMAL HIGH (ref 11.5–15.5)
WBC: 18.9 10*3/uL — ABNORMAL HIGH (ref 4.0–10.5)
nRBC: 0 % (ref 0.0–0.2)

## 2023-08-25 LAB — BASIC METABOLIC PANEL
Anion gap: 10 (ref 5–15)
BUN: 64 mg/dL — ABNORMAL HIGH (ref 8–23)
CO2: 23 mmol/L (ref 22–32)
Calcium: 8.4 mg/dL — ABNORMAL LOW (ref 8.9–10.3)
Chloride: 104 mmol/L (ref 98–111)
Creatinine, Ser: 3.96 mg/dL — ABNORMAL HIGH (ref 0.61–1.24)
GFR, Estimated: 16 mL/min — ABNORMAL LOW (ref >60)
Glucose, Bld: 288 mg/dL — ABNORMAL HIGH (ref 70–99)
Potassium: 3.8 mmol/L (ref 3.5–5.1)
Sodium: 137 mmol/L (ref 135–145)

## 2023-08-25 LAB — MAGNESIUM: Magnesium: 1.7 mg/dL (ref 1.7–2.4)

## 2023-08-25 LAB — URINALYSIS, MICROSCOPIC (REFLEX): Squamous Epithelial / HPF: NONE SEEN /[HPF] (ref 0–5)

## 2023-08-25 LAB — TROPONIN I (HIGH SENSITIVITY)
Troponin I (High Sensitivity): 46 ng/L — ABNORMAL HIGH (ref <18)
Troponin I (High Sensitivity): 51 ng/L — ABNORMAL HIGH (ref <18)

## 2023-08-25 LAB — CBG MONITORING, ED: Glucose-Capillary: 183 mg/dL — ABNORMAL HIGH (ref 70–99)

## 2023-08-25 LAB — RESP PANEL BY RT-PCR (RSV, FLU A&B, COVID)  RVPGX2
Influenza A by PCR: NEGATIVE
Influenza B by PCR: NEGATIVE
Resp Syncytial Virus by PCR: NEGATIVE
SARS Coronavirus 2 by RT PCR: NEGATIVE

## 2023-08-25 LAB — BRAIN NATRIURETIC PEPTIDE: B Natriuretic Peptide: 106.6 pg/mL — ABNORMAL HIGH (ref 0.0–100.0)

## 2023-08-25 MED ORDER — ISOSORBIDE MONONITRATE ER 60 MG PO TB24
60.0000 mg | ORAL_TABLET | Freq: Every day | ORAL | Status: DC
  Administered 2023-08-27 – 2023-08-28 (×2): 60 mg via ORAL
  Filled 2023-08-25 (×3): qty 1

## 2023-08-25 MED ORDER — CARVEDILOL 25 MG PO TABS
25.0000 mg | ORAL_TABLET | Freq: Two times a day (BID) | ORAL | Status: DC
  Administered 2023-08-25 – 2023-09-03 (×18): 25 mg via ORAL
  Filled 2023-08-25 (×17): qty 1
  Filled 2023-08-25 (×2): qty 2

## 2023-08-25 MED ORDER — INSULIN GLARGINE-YFGN 100 UNIT/ML ~~LOC~~ SOLN
40.0000 [IU] | Freq: Two times a day (BID) | SUBCUTANEOUS | Status: DC
Start: 2023-08-25 — End: 2023-08-26
  Administered 2023-08-25 – 2023-08-26 (×2): 40 [IU] via SUBCUTANEOUS
  Filled 2023-08-25 (×2): qty 400
  Filled 2023-08-25: qty 0.4
  Filled 2023-08-25: qty 400

## 2023-08-25 MED ORDER — AMLODIPINE BESYLATE 10 MG PO TABS
10.0000 mg | ORAL_TABLET | Freq: Every morning | ORAL | Status: DC
  Administered 2023-08-26 – 2023-08-27 (×2): 10 mg via ORAL
  Filled 2023-08-25: qty 1
  Filled 2023-08-25: qty 2

## 2023-08-25 MED ORDER — PANTOPRAZOLE SODIUM 40 MG PO TBEC
40.0000 mg | DELAYED_RELEASE_TABLET | Freq: Every day | ORAL | Status: DC
  Administered 2023-08-26 – 2023-09-03 (×9): 40 mg via ORAL
  Filled 2023-08-25 (×9): qty 1

## 2023-08-25 MED ORDER — ALBUTEROL SULFATE (2.5 MG/3ML) 0.083% IN NEBU: 2.5000 mg | INHALATION_SOLUTION | RESPIRATORY_TRACT | Status: DC | PRN

## 2023-08-25 MED ORDER — HYDRALAZINE HCL 25 MG PO TABS
100.0000 mg | ORAL_TABLET | Freq: Three times a day (TID) | ORAL | Status: DC
  Administered 2023-08-25 – 2023-08-28 (×8): 100 mg via ORAL
  Filled 2023-08-25 (×7): qty 4

## 2023-08-25 MED ORDER — ALBUTEROL SULFATE HFA 108 (90 BASE) MCG/ACT IN AERS: 2.0000 | INHALATION_SPRAY | RESPIRATORY_TRACT | Status: DC | PRN

## 2023-08-25 MED ORDER — POTASSIUM CHLORIDE CRYS ER 10 MEQ PO TBCR
10.0000 meq | EXTENDED_RELEASE_TABLET | Freq: Every day | ORAL | Status: DC
  Administered 2023-08-26 – 2023-08-27 (×2): 10 meq via ORAL
  Filled 2023-08-25: qty 1

## 2023-08-25 MED ORDER — FUROSEMIDE 10 MG/ML IJ SOLN
40.0000 mg | Freq: Once | INTRAMUSCULAR | Status: AC
  Administered 2023-08-25: 40 mg via INTRAVENOUS
  Filled 2023-08-25: qty 4

## 2023-08-25 MED ORDER — ATORVASTATIN CALCIUM 80 MG PO TABS
80.0000 mg | ORAL_TABLET | Freq: Every day | ORAL | Status: DC
  Administered 2023-08-25 – 2023-09-02 (×9): 80 mg via ORAL
  Filled 2023-08-25: qty 1
  Filled 2023-08-25: qty 2
  Filled 2023-08-25 (×7): qty 1

## 2023-08-25 MED ORDER — LOSARTAN POTASSIUM 50 MG PO TABS
100.0000 mg | ORAL_TABLET | Freq: Every day | ORAL | Status: DC
  Administered 2023-08-26 – 2023-08-27 (×2): 100 mg via ORAL
  Filled 2023-08-25: qty 2
  Filled 2023-08-25: qty 4

## 2023-08-25 MED ORDER — TRAMADOL HCL 50 MG PO TABS
50.0000 mg | ORAL_TABLET | Freq: Three times a day (TID) | ORAL | Status: DC | PRN
  Administered 2023-08-25 – 2023-09-01 (×8): 50 mg via ORAL
  Filled 2023-08-25 (×10): qty 1

## 2023-08-25 MED ORDER — FUROSEMIDE 10 MG/ML IJ SOLN
40.0000 mg | Freq: Two times a day (BID) | INTRAMUSCULAR | Status: DC
  Administered 2023-08-26 – 2023-08-27 (×3): 40 mg via INTRAVENOUS
  Filled 2023-08-25 (×3): qty 4

## 2023-08-25 MED ORDER — INSULIN ASPART PROT & ASPART (70-30 MIX) 100 UNIT/ML ~~LOC~~ SUSP
40.0000 [IU] | Freq: Two times a day (BID) | SUBCUTANEOUS | Status: DC
Start: 2023-08-26 — End: 2023-08-26
  Administered 2023-08-26: 40 [IU] via SUBCUTANEOUS

## 2023-08-25 MED ORDER — FERROUS SULFATE 325 (65 FE) MG PO TABS
325.0000 mg | ORAL_TABLET | Freq: Three times a day (TID) | ORAL | Status: DC
  Administered 2023-08-26 – 2023-09-03 (×25): 325 mg via ORAL
  Filled 2023-08-25 (×25): qty 1

## 2023-08-25 NOTE — ED Notes (Signed)
 Pt. Given dinner tolerated well

## 2023-08-25 NOTE — ED Notes (Signed)
 ED Provider at bedside.

## 2023-08-25 NOTE — ED Notes (Signed)
 X-ray at bedside

## 2023-08-25 NOTE — ED Triage Notes (Signed)
 Pt with SHOB since last night; hx of CHF; only able to speak a 1-2 words at a time in triage

## 2023-08-25 NOTE — ED Provider Notes (Addendum)
 [place] [facility] AT [facility] Provider Note   CSN: [id number] Arrival date & time: 08/25/23  1945     History  Chief Complaint  Patient presents with   Shortness of Breath    Peter Terry is a 68 y.o. male.  Pt is a 68 yo male with pmhx significant for CHF, morbid obesity, htn, dm, hld, ckd, and sleep apnea.  Pt presents to the ED today with sob since yesterday.  He did not take his diuretics today because he was too tired.  He did see his cardiologist, Dr. Ronell Coe, on 1/13 who noted that his weight was up.  He told him to continue with bumex  once a day and take metolazone  2.5 mg every other day 30 minutes prior to his bumex .  He did note that pt may need hospital admission with iv diuretics.  Pt denies f/c.          Home Medications Prior to Admission medications   Medication Sig Start Date End Date Taking? Authorizing Provider  albuterol  (VENTOLIN  HFA) 108 (90 Base) MCG/ACT inhaler Inhale 2 puffs into the lungs every 4 (four) hours as needed for wheezing or shortness of breath.    [provider]  amLODipine  (NORVASC ) 10 MG tablet Take 1 tablet (10 mg total) by mouth every morning. 04/14/23   Graig Lawyer, MD  atorvastatin  (LIPITOR) 80 MG tablet Take 80 mg by mouth at bedtime. 05/07/21   [provider]  bumetanide  (BUMEX ) 2 MG tablet Take 1 tablet (2 mg total) by mouth daily. 06/10/23   Madireddy, Daymon Evans, MD  carvedilol  (COREG ) 25 MG tablet Take 25 mg by mouth 2 (two) times daily. 05/07/21   [provider]  ferrous sulfate  325 (65 FE) MG EC tablet Take 1 tablet (325 mg total) by mouth 3 (three) times daily with meals. 05/12/23   Graig Lawyer, MD  hydrALAZINE  (APRESOLINE ) 100 MG tablet Take 100 mg by mouth 3 (three) times daily. 05/08/23   [provider]  insulin  aspart protamine - aspart (NOVOLOG  MIX 70/30 FLEXPEN) (70-30) 100 UNIT/ML FlexPen Inject 40 Units into the skin 2 (two) times daily with a meal.  07/13/23   Graig Lawyer, MD  Insulin  Glargine (BASAGLAR  KWIKPEN) 100 UNIT/ML INJECT 40 UNITS INTO THE SKIN TWICE A DAY 07/26/23   Graig Lawyer, MD  isosorbide  mononitrate (IMDUR ) 60 MG 24 hr tablet Take 60 mg by mouth daily. 05/08/23   [provider]  losartan  (COZAAR ) 100 MG tablet Take 100 mg by mouth daily. 07/09/23   [provider]  metolazone  (ZAROXOLYN ) 2.5 MG tablet Take 1 tablet (2.5 mg total) by mouth every other day. 07/05/23   Madireddy, Daymon Evans, MD  omeprazole  (PRILOSEC) 20 MG capsule Take 1 capsule (20 mg total) by mouth daily. 03/31/23   Graig Lawyer, MD  potassium chloride  (MICRO-K ) 10 MEQ CR capsule Take 1 capsule (10 mEq total) by mouth daily. 05/31/23   Graig Lawyer, MD  traMADol  (ULTRAM ) 50 MG tablet Take 1 tablet (50 mg total) by mouth every 8 (eight) hours as needed. 07/13/23   Graig Lawyer, MD      Allergies    Patient has no known allergies.    Review of Systems   Review of Systems  Respiratory:  Positive for shortness of breath.   All other systems reviewed and are negative.   Physical Exam Updated Vital Signs BP (!) 154/84   Pulse (!) 102  Temp 99.1 F (37.3 C) (Oral)   Resp (!) 22   Ht 5\' 9"  (1.753 m)   Wt (!) 152.9 kg   SpO2 94%   BMI 49.78 kg/m  Physical Exam Vitals and nursing note reviewed.  Constitutional:      Appearance: He is well-developed. He is obese.  HENT:     Head: Normocephalic and atraumatic.     Mouth/Throat:     Mouth: Mucous membranes are moist.     Pharynx: Oropharynx is clear.  Eyes:     Extraocular Movements: Extraocular movements intact.     Pupils: Pupils are equal, round, and reactive to light.  Cardiovascular:     Rate and Rhythm: Regular rhythm. Tachycardia present.  Pulmonary:     Effort: Tachypnea present.     Breath sounds: Rhonchi present.  Abdominal:     General: Bowel sounds are normal.     Palpations: Abdomen is soft.  Musculoskeletal:        General: Normal range of  motion.     Cervical back: Normal range of motion and neck supple.     Right lower leg: Edema present.     Left lower leg: Edema present.  Skin:    General: Skin is warm.     Capillary Refill: Capillary refill takes less than 2 seconds.  Neurological:     General: No focal deficit present.     Mental Status: He is alert and oriented to person, place, and time.  Psychiatric:        Mood and Affect: Mood normal.        Behavior: Behavior normal.     ED Results / Procedures / Treatments   Labs (all labs ordered are listed, but only abnormal results are displayed) Labs Reviewed  BASIC METABOLIC PANEL - Abnormal; Notable for the following components:      Result Value   Glucose, Bld 288 (*)    BUN 64 (*)    Creatinine, Ser 3.96 (*)    Calcium  8.4 (*)    GFR, Estimated 16 (*)    All other components within normal limits  CBC WITH DIFFERENTIAL/PLATELET - Abnormal; Notable for the following components:   WBC 18.9 (*)    RBC 3.78 (*)    Hemoglobin 9.2 (*)    HCT 28.5 (*)    MCV 75.4 (*)    MCH 24.3 (*)    RDW 16.3 (*)    Neutro Abs 17.3 (*)    Abs Immature Granulocytes 0.13 (*)    All other components within normal limits  BRAIN NATRIURETIC PEPTIDE - Abnormal; Notable for the following components:   B Natriuretic Peptide 106.6 (*)    All other components within normal limits  TROPONIN I (HIGH SENSITIVITY) - Abnormal; Notable for the following components:   Troponin I (High Sensitivity) 46 (*)    All other components within normal limits  RESP PANEL BY RT-PCR (RSV, FLU A&B, COVID)  [id number]  MAGNESIUM  URINALYSIS, ROUTINE W REFLEX MICROSCOPIC  TROPONIN I (HIGH SENSITIVITY)    EKG EKG Interpretation Date/Time:  Wednesday August 25 2023 20:02:38 EST Ventricular Rate:  107 PR Interval:  194 QRS Duration:  98 QT Interval:  349 QTC Calculation: 466 R Axis:   63  Text Interpretation: Sinus tachycardia Since last tracing rate faster Confirmed by Sueellen Emery [id number]) on  08/25/2023 9:23:05 PM  Radiology DG Chest Port 1 View Result Date: 08/25/2023 CLINICAL DATA:  Shortness of breath. EXAM: PORTABLE CHEST 1 VIEW COMPARISON:  05/02/2023 FINDINGS: Mild cardiomegaly. Unchanged mediastinal contours. Aortic atherosclerosis. Vascular congestion. No focal airspace disease. No large pleural effusion. No pneumothorax. On limited assessment, no acute osseous findings. IMPRESSION: Mild cardiomegaly with vascular congestion. Electronically Signed   By: Chadwick Colonel M.D.   On: 08/25/2023 20:41    Procedures Procedures    Medications Ordered in ED Medications  furosemide  (LASIX ) injection 40 mg (40 mg Intravenous Given 08/25/23 2038)    ED Course/ Medical Decision Making/ A&P                                 Medical Decision Making Amount and/or Complexity of Data Reviewed Labs: ordered. Radiology: ordered.  Risk OTC drugs. Prescription drug management. Decision regarding hospitalization.   This patient presents to the ED for concern of sob, this involves an extensive number of treatment options, and is a complaint that carries with it a high risk of complications and morbidity.  The differential diagnosis includes chf, pna, covid/flu/rsv   Co morbidities that complicate the patient evaluation  CHF, morbid obesity, htn, dm, hld, ckd, and sleep apnea   Additional history obtained:  Additional history obtained from epic chart review    Lab Tests:  I Ordered, and personally interpreted labs.  The pertinent results include:  cbc with wbc elevated at 18.9 (9.3 in October), hgb low at 9.2 (chronic), bnp 106.6, bmp with glucose elevated at 288, bun elevated at 64 and cr elevated at 3.96 (chronic), mg nl, trop sl elevated at 46; covid/flu/rsv neg   Imaging Studies ordered:  I ordered imaging studies including cxr  I independently visualized and interpreted imaging which showed Mild cardiomegaly with vascular congestion.  I agree with the radiologist  interpretation   Cardiac Monitoring:  The patient was maintained on a cardiac monitor.  I personally viewed and interpreted the cardiac monitored which showed an underlying rhythm of: st   Medicines ordered and prescription drug management:  I ordered medication including lasix   for chf  Reevaluation of the patient after these medicines showed that the patient improved I have reviewed the patients home medicines and have made adjustments as needed  Critical Interventions:  lasix    Consultations Obtained:  I requested consultation with the hospitalist (Dr. Michell Ahumada),  and discussed lab and imaging findings as well as pertinent plan -she will admit   Problem List / ED Course:  CHF exacerbation:  pt's weight is up and he is in CHF.  He has diuresed with 40 lasix  iv, but needs more.  He is less sob with sitting, but is sob with movement.  Trop is slightly elevated, but he has not cp.  There are no beds tonight, so I have ordered his routine meds + diet.  I held off on his oral diuretics b/c we are giving him iv.   Reevaluation:  After the interventions noted above, I reevaluated the patient and found that they have :improved   Social Determinants of Health:  Lives at home   Dispostion:  After consideration of the diagnostic results and the patients response to treatment, I feel that the patent would benefit from admission.          Final Clinical Impression(s) / ED Diagnoses Final diagnoses:  Acute on chronic congestive heart failure, unspecified heart failure type (HCC)    Rx / DC Orders ED Discharge Orders     None         Zorah Backes,  Concha Deed, MD 08/25/23 2231    Sueellen Emery, MD 08/25/23 2234

## 2023-08-26 DIAGNOSIS — N179 Acute kidney failure, unspecified: Secondary | ICD-10-CM

## 2023-08-26 DIAGNOSIS — I5033 Acute on chronic diastolic (congestive) heart failure: Secondary | ICD-10-CM

## 2023-08-26 DIAGNOSIS — I509 Heart failure, unspecified: Secondary | ICD-10-CM | POA: Diagnosis not present

## 2023-08-26 DIAGNOSIS — N184 Chronic kidney disease, stage 4 (severe): Secondary | ICD-10-CM

## 2023-08-26 DIAGNOSIS — Z794 Long term (current) use of insulin: Secondary | ICD-10-CM

## 2023-08-26 DIAGNOSIS — J449 Chronic obstructive pulmonary disease, unspecified: Secondary | ICD-10-CM | POA: Diagnosis not present

## 2023-08-26 DIAGNOSIS — I2510 Atherosclerotic heart disease of native coronary artery without angina pectoris: Secondary | ICD-10-CM | POA: Diagnosis not present

## 2023-08-26 DIAGNOSIS — I130 Hypertensive heart and chronic kidney disease with heart failure and stage 1 through stage 4 chronic kidney disease, or unspecified chronic kidney disease: Secondary | ICD-10-CM | POA: Diagnosis not present

## 2023-08-26 DIAGNOSIS — Z6841 Body Mass Index (BMI) 40.0 and over, adult: Secondary | ICD-10-CM | POA: Diagnosis not present

## 2023-08-26 DIAGNOSIS — K219 Gastro-esophageal reflux disease without esophagitis: Secondary | ICD-10-CM | POA: Diagnosis not present

## 2023-08-26 DIAGNOSIS — Z833 Family history of diabetes mellitus: Secondary | ICD-10-CM | POA: Diagnosis not present

## 2023-08-26 DIAGNOSIS — I10 Essential (primary) hypertension: Secondary | ICD-10-CM

## 2023-08-26 DIAGNOSIS — E1122 Type 2 diabetes mellitus with diabetic chronic kidney disease: Secondary | ICD-10-CM

## 2023-08-26 DIAGNOSIS — M799 Soft tissue disorder, unspecified: Secondary | ICD-10-CM | POA: Diagnosis not present

## 2023-08-26 DIAGNOSIS — M25461 Effusion, right knee: Secondary | ICD-10-CM | POA: Diagnosis not present

## 2023-08-26 DIAGNOSIS — E782 Mixed hyperlipidemia: Secondary | ICD-10-CM

## 2023-08-26 DIAGNOSIS — R600 Localized edema: Secondary | ICD-10-CM | POA: Diagnosis not present

## 2023-08-26 DIAGNOSIS — Z1152 Encounter for screening for COVID-19: Secondary | ICD-10-CM | POA: Diagnosis not present

## 2023-08-26 DIAGNOSIS — E785 Hyperlipidemia, unspecified: Secondary | ICD-10-CM | POA: Diagnosis not present

## 2023-08-26 DIAGNOSIS — E8809 Other disorders of plasma-protein metabolism, not elsewhere classified: Secondary | ICD-10-CM | POA: Diagnosis not present

## 2023-08-26 DIAGNOSIS — G4733 Obstructive sleep apnea (adult) (pediatric): Secondary | ICD-10-CM

## 2023-08-26 DIAGNOSIS — D649 Anemia, unspecified: Secondary | ICD-10-CM | POA: Diagnosis not present

## 2023-08-26 DIAGNOSIS — L03115 Cellulitis of right lower limb: Secondary | ICD-10-CM | POA: Diagnosis not present

## 2023-08-26 DIAGNOSIS — E1165 Type 2 diabetes mellitus with hyperglycemia: Secondary | ICD-10-CM | POA: Diagnosis not present

## 2023-08-26 DIAGNOSIS — D631 Anemia in chronic kidney disease: Secondary | ICD-10-CM | POA: Diagnosis not present

## 2023-08-26 DIAGNOSIS — I5031 Acute diastolic (congestive) heart failure: Secondary | ICD-10-CM | POA: Diagnosis not present

## 2023-08-26 DIAGNOSIS — Z8249 Family history of ischemic heart disease and other diseases of the circulatory system: Secondary | ICD-10-CM | POA: Diagnosis not present

## 2023-08-26 DIAGNOSIS — R0602 Shortness of breath: Secondary | ICD-10-CM | POA: Diagnosis not present

## 2023-08-26 DIAGNOSIS — E1151 Type 2 diabetes mellitus with diabetic peripheral angiopathy without gangrene: Secondary | ICD-10-CM | POA: Diagnosis not present

## 2023-08-26 DIAGNOSIS — I5030 Unspecified diastolic (congestive) heart failure: Secondary | ICD-10-CM | POA: Diagnosis not present

## 2023-08-26 DIAGNOSIS — Z79899 Other long term (current) drug therapy: Secondary | ICD-10-CM | POA: Diagnosis not present

## 2023-08-26 DIAGNOSIS — E1142 Type 2 diabetes mellitus with diabetic polyneuropathy: Secondary | ICD-10-CM | POA: Diagnosis not present

## 2023-08-26 DIAGNOSIS — D638 Anemia in other chronic diseases classified elsewhere: Secondary | ICD-10-CM | POA: Diagnosis not present

## 2023-08-26 DIAGNOSIS — E1169 Type 2 diabetes mellitus with other specified complication: Secondary | ICD-10-CM | POA: Diagnosis not present

## 2023-08-26 DIAGNOSIS — E66813 Obesity, class 3: Secondary | ICD-10-CM | POA: Diagnosis not present

## 2023-08-26 DIAGNOSIS — E876 Hypokalemia: Secondary | ICD-10-CM | POA: Diagnosis not present

## 2023-08-26 HISTORY — DX: Acute kidney failure, unspecified: N17.9

## 2023-08-26 LAB — COMPREHENSIVE METABOLIC PANEL
ALT: 14 U/L (ref 0–44)
AST: 18 U/L (ref 15–41)
Albumin: 2.3 g/dL — ABNORMAL LOW (ref 3.5–5.0)
Alkaline Phosphatase: 71 U/L (ref 38–126)
Anion gap: 10 (ref 5–15)
BUN: 59 mg/dL — ABNORMAL HIGH (ref 8–23)
CO2: 25 mmol/L (ref 22–32)
Calcium: 8.5 mg/dL — ABNORMAL LOW (ref 8.9–10.3)
Chloride: 106 mmol/L (ref 98–111)
Creatinine, Ser: 4.06 mg/dL — ABNORMAL HIGH (ref 0.61–1.24)
GFR, Estimated: 16 mL/min — ABNORMAL LOW (ref >60)
Glucose, Bld: 103 mg/dL — ABNORMAL HIGH (ref 70–99)
Potassium: 3.4 mmol/L — ABNORMAL LOW (ref 3.5–5.1)
Sodium: 141 mmol/L (ref 135–145)
Total Bilirubin: 0.7 mg/dL (ref 0.0–1.2)
Total Protein: 7.7 g/dL (ref 6.5–8.1)

## 2023-08-26 LAB — GLUCOSE, CAPILLARY
Glucose-Capillary: 91 mg/dL (ref 70–99)
Glucose-Capillary: 144 mg/dL — ABNORMAL HIGH (ref 70–99)

## 2023-08-26 LAB — SEDIMENTATION RATE: Sed Rate: >140 mm/h — ABNORMAL HIGH (ref 0–16)

## 2023-08-26 LAB — CBC
HCT: 26.2 % — ABNORMAL LOW (ref 39.0–52.0)
Hemoglobin: 8.4 g/dL — ABNORMAL LOW (ref 13.0–17.0)
MCH: 24.3 pg — ABNORMAL LOW (ref 26.0–34.0)
MCHC: 32.1 g/dL (ref 30.0–36.0)
MCV: 75.9 fL — ABNORMAL LOW (ref 80.0–100.0)
Platelets: 197 10*3/uL (ref 150–400)
RBC: 3.45 MIL/uL — ABNORMAL LOW (ref 4.22–5.81)
RDW: 16.3 % — ABNORMAL HIGH (ref 11.5–15.5)
WBC: 10.6 10*3/uL — ABNORMAL HIGH (ref 4.0–10.5)
nRBC: 0 % (ref 0.0–0.2)

## 2023-08-26 LAB — CBG MONITORING, ED
Glucose-Capillary: 145 mg/dL — ABNORMAL HIGH (ref 70–99)
Glucose-Capillary: 154 mg/dL — ABNORMAL HIGH (ref 70–99)

## 2023-08-26 LAB — C-REACTIVE PROTEIN: CRP: 16.2 mg/dL — ABNORMAL HIGH (ref <1.0)

## 2023-08-26 LAB — HIV ANTIBODY (ROUTINE TESTING W REFLEX): HIV Screen 4th Generation wRfx: NONREACTIVE

## 2023-08-26 LAB — MAGNESIUM: Magnesium: 1.8 mg/dL (ref 1.7–2.4)

## 2023-08-26 MED ORDER — INSULIN ASPART 100 UNIT/ML IJ SOLN
0.0000 [IU] | Freq: Every day | INTRAMUSCULAR | Status: DC
  Administered 2023-08-28 – 2023-09-02 (×5): 2 [IU] via SUBCUTANEOUS

## 2023-08-26 MED ORDER — INSULIN GLARGINE-YFGN 100 UNIT/ML ~~LOC~~ SOLN
30.0000 [IU] | Freq: Two times a day (BID) | SUBCUTANEOUS | Status: DC
  Administered 2023-08-26 – 2023-08-27 (×2): 30 [IU] via SUBCUTANEOUS
  Filled 2023-08-26 (×3): qty 0.3

## 2023-08-26 MED ORDER — ENOXAPARIN SODIUM 80 MG/0.8ML IJ SOSY: 70.0000 mg | PREFILLED_SYRINGE | INTRAMUSCULAR | Status: DC

## 2023-08-26 MED ORDER — METRONIDAZOLE 500 MG/100ML IV SOLN
500.0000 mg | Freq: Two times a day (BID) | INTRAVENOUS | Status: DC
  Administered 2023-08-26 – 2023-08-31 (×10): 500 mg via INTRAVENOUS
  Filled 2023-08-26 (×13): qty 100

## 2023-08-26 MED ORDER — ENOXAPARIN SODIUM 30 MG/0.3ML IJ SOSY
30.0000 mg | PREFILLED_SYRINGE | INTRAMUSCULAR | Status: DC
  Administered 2023-08-26: 30 mg via SUBCUTANEOUS
  Filled 2023-08-26: qty 0.3

## 2023-08-26 MED ORDER — ACETAMINOPHEN 325 MG PO TABS
650.0000 mg | ORAL_TABLET | Freq: Four times a day (QID) | ORAL | Status: DC | PRN
  Administered 2023-08-31: 650 mg via ORAL
  Filled 2023-08-26: qty 2

## 2023-08-26 MED ORDER — SODIUM CHLORIDE 0.9 % IV SOLN
2.0000 g | INTRAVENOUS | Status: DC
  Administered 2023-08-26 – 2023-08-30 (×5): 2 g via INTRAVENOUS
  Filled 2023-08-26 (×8): qty 20

## 2023-08-26 MED ORDER — SODIUM CHLORIDE 0.9% FLUSH
3.0000 mL | Freq: Two times a day (BID) | INTRAVENOUS | Status: DC
  Administered 2023-08-26 – 2023-09-03 (×16): 3 mL via INTRAVENOUS

## 2023-08-26 MED ORDER — INSULIN ASPART 100 UNIT/ML IJ SOLN
0.0000 [IU] | Freq: Three times a day (TID) | INTRAMUSCULAR | Status: DC
  Administered 2023-08-26: 3 [IU] via SUBCUTANEOUS
  Administered 2023-08-26 – 2023-08-27 (×2): 2 [IU] via SUBCUTANEOUS
  Administered 2023-08-27: 3 [IU] via SUBCUTANEOUS
  Administered 2023-08-28: 5 [IU] via SUBCUTANEOUS
  Administered 2023-08-28 – 2023-08-29 (×4): 3 [IU] via SUBCUTANEOUS
  Administered 2023-08-29: 8 [IU] via SUBCUTANEOUS
  Administered 2023-08-30: 3 [IU] via SUBCUTANEOUS
  Administered 2023-08-30 – 2023-08-31 (×3): 5 [IU] via SUBCUTANEOUS
  Administered 2023-08-31: 3 [IU] via SUBCUTANEOUS
  Administered 2023-08-31: 5 [IU] via SUBCUTANEOUS
  Administered 2023-09-01: 3 [IU] via SUBCUTANEOUS
  Administered 2023-09-01: 2 [IU] via SUBCUTANEOUS
  Administered 2023-09-01: 5 [IU] via SUBCUTANEOUS
  Administered 2023-09-02: 2 [IU] via SUBCUTANEOUS
  Administered 2023-09-02: 8 [IU] via SUBCUTANEOUS
  Administered 2023-09-02 – 2023-09-03 (×2): 5 [IU] via SUBCUTANEOUS

## 2023-08-26 MED ORDER — INSULIN ASPART PROT & ASPART (70-30 MIX) 100 UNIT/ML ~~LOC~~ SUSP
30.0000 [IU] | Freq: Two times a day (BID) | SUBCUTANEOUS | Status: DC
Start: 2023-08-26 — End: 2023-08-27
  Administered 2023-08-26: 30 [IU] via SUBCUTANEOUS
  Filled 2023-08-26: qty 10

## 2023-08-26 MED ORDER — POLYETHYLENE GLYCOL 3350 17 G PO PACK: 17.0000 g | PACK | Freq: Every day | ORAL | Status: DC | PRN

## 2023-08-26 MED ORDER — ACETAMINOPHEN 650 MG RE SUPP: 650.0000 mg | Freq: Four times a day (QID) | RECTAL | Status: DC | PRN

## 2023-08-26 NOTE — ED Notes (Signed)
Called [facility] for Transport @11 :09.  Spoke with [employer]

## 2023-08-26 NOTE — Progress Notes (Signed)
OT Cancellation Note  Patient Details Name: Peter Terry MRN: [id number] DOB: [birth year]   Cancelled Treatment:    Reason Eval/Treat Not Completed: Fatigue/lethargy limiting ability to participate (Attempted to engage with pt in therapy, he reports being too tired and fatigue to engage and would like to rest. OT respectful of pt wishes, OT to follow-up with pt as able)  08/26/2023  AB, OTR/L  Acute Rehabilitation Services  Office: [phone]   Tristan Schroeder 08/26/2023, 3:57 PM

## 2023-08-26 NOTE — Plan of Care (Signed)
  Problem: Education: Goal: Ability to describe self-care measures that may prevent or decrease complications (Diabetes Survival Skills Education) will improve Outcome: Progressing Goal: Individualized Educational Video(s) Outcome: Progressing   Problem: Coping: Goal: Ability to adjust to condition or change in health will improve Outcome: Progressing   Problem: Fluid Volume: Goal: Ability to maintain a balanced intake and output will improve Outcome: Progressing   Problem: Health Behavior/Discharge Planning: Goal: Ability to identify and utilize available resources and services will improve Outcome: Progressing Goal: Ability to manage health-related needs will improve Outcome: Progressing   Problem: Metabolic: Goal: Ability to maintain appropriate glucose levels will improve Outcome: Progressing   Problem: Nutritional: Goal: Maintenance of adequate nutrition will improve Outcome: Progressing Goal: Progress toward achieving an optimal weight will improve Outcome: Progressing   Problem: Skin Integrity: Goal: Risk for impaired skin integrity will decrease Outcome: Progressing   Problem: Tissue Perfusion: Goal: Adequacy of tissue perfusion will improve Outcome: Progressing   Problem: Education: Goal: Knowledge of General Education information will improve Description: Including pain rating scale, medication(s)/side effects and non-pharmacologic comfort measures Outcome: Progressing   Problem: Health Behavior/Discharge Planning: Goal: Ability to manage health-related needs will improve Outcome: Progressing   Problem: Clinical Measurements: Goal: Ability to maintain clinical measurements within normal limits will improve Outcome: Progressing Goal: Will remain free from infection Outcome: Progressing Goal: Diagnostic test results will improve Outcome: Progressing Goal: Respiratory complications will improve Outcome: Progressing Goal: Cardiovascular complication will  be avoided Outcome: Progressing   Problem: Activity: Goal: Risk for activity intolerance will decrease Outcome: Progressing   Problem: Nutrition: Goal: Adequate nutrition will be maintained Outcome: Progressing   Problem: Coping: Goal: Level of anxiety will decrease Outcome: Progressing   Problem: Elimination: Goal: Will not experience complications related to bowel motility Outcome: Progressing Goal: Will not experience complications related to urinary retention Outcome: Progressing   Problem: Pain Managment: Goal: General experience of comfort will improve and/or be controlled Outcome: Progressing   Problem: Safety: Goal: Ability to remain free from injury will improve Outcome: Progressing   Problem: Skin Integrity: Goal: Risk for impaired skin integrity will decrease Outcome: Progressing   Problem: Education: Goal: Ability to demonstrate management of disease process will improve Outcome: Progressing Goal: Ability to verbalize understanding of medication therapies will improve Outcome: Progressing Goal: Individualized Educational Video(s) Outcome: Progressing   Problem: Activity: Goal: Capacity to carry out activities will improve Outcome: Progressing   Problem: Cardiac: Goal: Ability to achieve and maintain adequate cardiopulmonary perfusion will improve Outcome: Progressing   Problem: Education: Goal: Ability to demonstrate management of disease process will improve Outcome: Progressing Goal: Ability to verbalize understanding of medication therapies will improve Outcome: Progressing Goal: Individualized Educational Video(s) Outcome: Progressing   Problem: Activity: Goal: Capacity to carry out activities will improve Outcome: Progressing   Problem: Cardiac: Goal: Ability to achieve and maintain adequate cardiopulmonary perfusion will improve Outcome: Progressing

## 2023-08-26 NOTE — ED Notes (Signed)
Pt has 1.5L of urine in foley bag (condom cath)

## 2023-08-26 NOTE — Plan of Care (Signed)
  Problem: Education: Goal: Ability to describe self-care measures that may prevent or decrease complications (Diabetes Survival Skills Education) will improve Outcome: Progressing Goal: Individualized Educational Video(s) Outcome: Progressing   

## 2023-08-26 NOTE — H&P (Signed)
History and Physical   Peter Terry:[id number] DOB: 1956-01-11 DOA: 08/25/2023  PCP: [name], MD   Patient coming from: Home  Chief Complaint: Shortness of breath  HPI: Peter Terry is a 68 y.o. male with medical history significant of chronic diastolic CHF, COPD, hypertension, hyperlipidemia, diabetes, CKD 4, GERD, obesity, OSA presenting with shortness of breath.  Patient presented to [facility] ED yesterday after 1 day of shortness of breath.  Had not taken diuretic yesterday secondary to feeling tired.  Patient has known CHF with ongoing volume overload.  Patient had follow-up with her cardiologist on 1/13 and weight had decreased to 337 from 345 on 12/3.  The note states that this is still elevated from his suspected drug rate of 313.  He was recommended to continue on with Bumex daily and metolazone every other day with suspicion that if he remains greater than 330 he would likely need inpatient IV diuresis.  Does report chronic wounds with worsening of the right lower extremity.  Patient denies fever, chills, chest pain, abdominal pain, constipation, diarrhea, nausea, vomiting.  ED Course: Vital signs in the ED notable for blood pressure in the 120s to 170 systolic, heart rate in the 70s to 100s, saturating well on room air.  Lab workup included BMP with BUN 64, creatinine 3.96 from 3.49-month ago and 2.74 months ago, glucose 288.  CBC showed leukocytosis to 18.9, hemoglobin stable at 9.2.  BNP borderline at 106 but this is in the setting of BMI of 50.  Troponin flat at 46 and then 51 on repeat.  Respiratory panel for flu COVID and RSV negative.  Urinalysis with hemoglobin, glucose, protein, rare bacteria.  Chest x-ray consistent with cardiomegaly and vascular congestion.  Magnesium normal.  Patient received home medications and IV Lasix in the ED.  Review of Systems: As per HPI otherwise all other systems reviewed and are negative.  Past Medical History:  Diagnosis Date    Acute heart failure with preserved ejection fraction (HFpEF) (HCC) 05/02/2023   CHF (congestive heart failure) (HCC)    Chronic kidney disease, stage 4 (severe) (HCC) 03/31/2023   Chronic right hip pain 07/13/2023   COPD (chronic obstructive pulmonary disease) (HCC) 03/30/2023   Diabetic peripheral neuropathy (HCC) 03/31/2023   Dyspnea on exertion 05/02/2023   Edema of both lower legs 03/31/2023   Essential hypertension 03/30/2023   GERD (gastroesophageal reflux disease) 03/31/2023   Heart Failure with Preserved EF (HCC) 03/31/2023   Hypomagnesemia 11/04/2022   Insulin long-term use (HCC) 08/21/2020   Iron deficiency anemia 03/31/2022   Mixed hyperlipidemia 03/30/2023   Morbid obesity with BMI of 45.0-49.9, adult (HCC) 07/25/2020   Morbid obesity with body mass index (BMI) of 50.0 to 59.9 in adult [facility]) 07/25/2020   Multiple open wounds of lower extremity 05/02/2023   Obstructive sleep apnea 03/31/2023   Retinal tear 02/20/2013   Tinea pedis 03/31/2023   Type 2 diabetes mellitus with hyperglycemia, with long-term current use of insulin (HCC) 05/02/2023   Type 2 diabetes mellitus with stage 4 chronic kidney disease, with long-term current use of insulin (HCC) 11/04/2022   Venous stasis dermatitis 03/31/2023   Venous stasis ulcer of left calf limited to breakdown of skin without varicose veins (HCC) 07/13/2023    Past Surgical History:  Procedure Laterality Date   APPENDECTOMY     LUMBAR DISC SURGERY     L4-L5    Social History  reports that he has quit smoking. His smoking use included cigarettes. He  does not have any smokeless tobacco history on file. He reports that he does not drink alcohol and does not use drugs.  No Known Allergies  Family History  Problem Relation Age of Onset   Cancer Mother    Diabetes Mother    Kidney disease Mother    Heart disease Father    Cancer Father        Colon   Kidney disease Father    Diabetes Father    Diabetes Brother    Heart  disease Paternal Aunt   Reviewed on admission  Prior to Admission medications   Medication Sig Start Date End Date Taking? Authorizing Provider  albuterol (VENTOLIN HFA) 108 (90 Base) MCG/ACT inhaler Inhale 2 puffs into the lungs every 4 (four) hours as needed for wheezing or shortness of breath.    [provider]  amLODipine (NORVASC) 10 MG tablet Take 1 tablet (10 mg total) by mouth every morning. 04/14/23   [name], MD  atorvastatin (LIPITOR) 80 MG tablet Take 80 mg by mouth at bedtime. 05/07/21   [provider]  bumetanide (BUMEX) 2 MG tablet Take 1 tablet (2 mg total) by mouth daily. 06/10/23   Madireddy, Marlyn Corporal, MD  carvedilol (COREG) 25 MG tablet Take 25 mg by mouth 2 (two) times daily. 05/07/21   [provider]  ferrous sulfate 325 (65 FE) MG EC tablet Take 1 tablet (325 mg total) by mouth 3 (three) times daily with meals. 05/12/23   [name], MD  hydrALAZINE (APRESOLINE) 100 MG tablet Take 100 mg by mouth 3 (three) times daily. 05/08/23   [provider]  insulin aspart protamine - aspart (NOVOLOG MIX 70/30 FLEXPEN) (70-30) 100 UNIT/ML FlexPen Inject 40 Units into the skin 2 (two) times daily with a meal. 07/13/23   [name], MD  Insulin Glargine [facility] KWIKPEN) 100 UNIT/ML INJECT 40 UNITS INTO THE SKIN TWICE A DAY 07/26/23   [name], MD  isosorbide mononitrate (IMDUR) 60 MG 24 hr tablet Take 60 mg by mouth daily. 05/08/23   [provider]  losartan (COZAAR) 100 MG tablet Take 100 mg by mouth daily. 07/09/23   [provider]  metolazone (ZAROXOLYN) 2.5 MG tablet Take 1 tablet (2.5 mg total) by mouth every other day. 07/05/23   Madireddy, Marlyn Corporal, MD  omeprazole (PRILOSEC) 20 MG capsule Take 1 capsule (20 mg total) by mouth daily. 03/31/23   [name], MD  potassium chloride (MICRO-K) 10 MEQ CR capsule Take 1 capsule (10 mEq total) by mouth daily. 05/31/23   [name], MD  traMADol  (ULTRAM) 50 MG tablet Take 1 tablet (50 mg total) by mouth every 8 (eight) hours as needed. 07/13/23   [name], MD    Physical Exam: Vitals:   08/26/23 0934 08/26/23 0937 08/26/23 1000 08/26/23 1130  BP: (!) 156/87 (!) 156/87 (!) 149/78 121/62  Pulse:  79 82 71  Resp:  16 17 15   Temp:  98.7 F (37.1 C)    TempSrc:  Oral    SpO2:  95% 97% 95%  Weight:      Height:        Physical Exam Constitutional:      General: He is not in acute distress.    Appearance: Normal appearance. He is obese.  HENT:     Head: Normocephalic and atraumatic.     Mouth/Throat:     Mouth: Mucous membranes are moist.  Pharynx: Oropharynx is clear.  Eyes:     Extraocular Movements: Extraocular movements intact.     Pupils: Pupils are equal, round, and reactive to light.  Cardiovascular:     Rate and Rhythm: Normal rate and regular rhythm.     Pulses: Normal pulses.     Heart sounds: Normal heart sounds.  Pulmonary:     Effort: Pulmonary effort is normal. No respiratory distress.     Breath sounds: Decreased breath sounds and rales (Trace (Distant)) present.  Abdominal:     General: Bowel sounds are normal. There is no distension.     Palpations: Abdomen is soft.     Tenderness: There is no abdominal tenderness.  Musculoskeletal:        General: No swelling or deformity.     Right lower leg: Edema present.     Left lower leg: Edema present.  Skin:    General: Skin is warm and dry.     Comments: Bilateral lower extremity wounds.  Right greater than left with some surrounding erythema and warmth in setting of diffuse lower extremity edema.  Neurological:     General: No focal deficit present.     Mental Status: Mental status is at baseline.    Labs on Admission: I have personally reviewed following labs and imaging studies  CBC: Recent Labs  Lab 08/25/23 2021  WBC 18.9*  NEUTROABS 17.3*  HGB 9.2*  HCT 28.5*  MCV 75.4*  PLT 214    Basic Metabolic Panel: Recent Labs  Lab  08/25/23 2021  NA 137  K 3.8  CL 104  CO2 23  GLUCOSE 288*  BUN 64*  CREATININE 3.96*  CALCIUM 8.4*  MG 1.7    GFR: Estimated Creatinine Clearance: 27.3 mL/min (A) (by C-G formula based on SCr of 3.96 mg/dL (H)).  Liver Function Tests: No results for input(s): "AST", "ALT", "ALKPHOS", "BILITOT", "PROT", "ALBUMIN" in the last 168 hours.  Urine analysis:    Component Value Date/Time   COLORURINE YELLOW 08/25/2023 2252   APPEARANCEUR CLEAR 08/25/2023 2252   LABSPEC 1.015 08/25/2023 2252   PHURINE 6.5 08/25/2023 2252   GLUCOSEU 100 (A) 08/25/2023 2252   HGBUR TRACE (A) 08/25/2023 2252   BILIRUBINUR NEGATIVE 08/25/2023 2252   KETONESUR NEGATIVE 08/25/2023 2252   PROTEINUR >=300 (A) 08/25/2023 2252   NITRITE NEGATIVE 08/25/2023 2252   LEUKOCYTESUR NEGATIVE 08/25/2023 2252    Radiological Exams on Admission: DG Chest Port 1 View Result Date: 08/25/2023 CLINICAL DATA:  Shortness of breath. EXAM: PORTABLE CHEST 1 VIEW COMPARISON:  05/02/2023 FINDINGS: Mild cardiomegaly. Unchanged mediastinal contours. Aortic atherosclerosis. Vascular congestion. No focal airspace disease. No large pleural effusion. No pneumothorax. On limited assessment, no acute osseous findings. IMPRESSION: Mild cardiomegaly with vascular congestion. Electronically Signed   By: Narda Rutherford M.D.   On: 08/25/2023 20:41    EKG: Independently reviewed.  Sinus tachycardia at 107 beats minute.  Low voltage multiple leads.  Significant baseline wander multiple leads.  Nonspecific T wave changes.  Assessment/Plan Principal Problem:   Acute exacerbation of CHF (congestive heart failure) (HCC) Active Problems:   Mixed hyperlipidemia   Essential hypertension   COPD (chronic obstructive pulmonary disease) (HCC)   Type 2 diabetes mellitus with stage 4 chronic kidney disease, with long-term current use of insulin (HCC)   Chronic kidney disease, stage 4 (severe) (HCC)   Obstructive sleep apnea   GERD  (gastroesophageal reflux disease)   Acute on chronic diastolic CHF Volume overload > Last echo was in  September of last year through [facility] which showed EF 60-65%, normal RV function. > Dry weight is believed to be around 313 pounds.  Most recent weight was 337 on 1/13 at cardiology visit.  This was down from weight of 345 on 12/30. > Has been continued on daily Bumex and every other day metolazone, if weight remains over 330 plan was to admit for IV diuresis. > BNP mildly elevated at 106 but this is likely falsely low in the setting of BMI 50.  Troponin flat at 46, 51.  Magnesium normal.  Chest x-ray with cardiomegaly and vascular congestion. > With worsening shortness of breath and his history patient was accepted for admission for IV diuresis and has received initial doses in the ED.  Good output per EDP on 40 mg IV Lasix. - Accepted to progressive unit overnight, will continue to monitor on progressive unit today but likely can transition to telemetry tomorrow pending response to initial treatment - Continue with Lasix 40 mg IV twice daily - Strict I's and O's daily weights - Continue home carvedilol, Imdur, valsartan - Recent echo 4 months ago, will hold off on repeat for now - Magnesium normal in ED - Trend renal function and electrolytes  Leukocytosis Extremity wounds > Incidentally noted to have leukocytosis to 18.9 in the ED. > Chest x-ray most consistent with cardiomegaly and vascular congestion and not pneumonia.  Urinalysis with rare bacteria only and no leukocytes. > Has chronic lower extremity wounds.  These have been waxing waning for about a year he says.  Right lower draining wound is especially worse with significant skin changes and softening centrally with some surrounding erythema and warmth.  Foul odor also noted on exam of this wound of the right lower extremity.  Has followed with wound care in the past, cannot remember ever requiring antibiotics for it. > No  obvious trigger for reactive leukocytosis.  Will cover for wound infection/cellulitis for now and repeat CBC. - Will repeat CBC now - WOC consult - Cover with ceftriaxone and Flagyl for now - Trend fever curve and WBC - ESR, CRP  COPD - Continue as needed albuterol  Hypertension - Continue home amlodipine, carvedilol, hydralazine, Imdur, valsartan - Lasix as above  Hyperlipidemia - Continue home atorvastatin  Diabetes > 40 units long-acting twice daily, 40 units 70/30 insulin twice daily. - 30 units long-acting twice daily, 40 units 70/30 insulin twice daily - SSI  AKI on CKD 4 > Creatinine elevated to 3.96.  Chart review shows creatinine around 2.74 months ago and 3.40-month ago. > Presumably secondary to volume overload, will continue to monitor response to diuresis. - Trend renal function and electrolytes no  GERD - Continue PPI  Obesity - Noted  OSA - Intolerant to CPAP  DVT prophylaxis: Lovenox Code Status:   Full Family Communication:  None on admission  Disposition Plan:   Patient is from:  Home  Anticipated DC to:  Home  Anticipated DC date:  2 to 5 days  Anticipated DC barriers: None  Consults called:  None Admission status:  Inpatient, progressive  Severity of Illness: The appropriate patient status for this patient is INPATIENT. Inpatient status is judged to be reasonable and necessary in order to provide the required intensity of service to ensure the patient's safety. The patient's presenting symptoms, physical exam findings, and initial radiographic and laboratory data in the context of their chronic comorbidities is felt to place them at high risk for further clinical deterioration. Furthermore, it is  not anticipated that the patient will be medically stable for discharge from the hospital within 2 midnights of admission.   * I certify that at the point of admission it is my clinical judgment that the patient will require inpatient hospital care  spanning beyond 2 midnights from the point of admission due to high intensity of service, high risk for further deterioration and high frequency of surveillance required.Synetta Fail MD [facility]  How to contact the [facility] Attending or Consulting provider 7A - 7P or covering provider during after hours 7P -7A, for this patient?   Check the care team in [facility] and look for a) attending/consulting [facility] provider listed and b) the [facility] team listed Log into [email or web address] and use [facility]'s universal password to access. If you do not have the password, please contact the hospital operator. Locate the [facility] provider you are looking for under [facility] and page to a number that you can be directly reached. If you still have difficulty reaching the provider, please page the [facility] (Director on Call) for the Hospitalists listed on amion for assistance.  08/26/2023, 1:41 PM

## 2023-08-27 ENCOUNTER — Inpatient Hospital Stay (HOSPITAL_COMMUNITY): Payer: Medicare HMO

## 2023-08-27 DIAGNOSIS — I5033 Acute on chronic diastolic (congestive) heart failure: Secondary | ICD-10-CM | POA: Diagnosis not present

## 2023-08-27 DIAGNOSIS — I5031 Acute diastolic (congestive) heart failure: Secondary | ICD-10-CM

## 2023-08-27 LAB — ECHOCARDIOGRAM COMPLETE
Area-P 1/2: 2.65 cm2
Height: 69 in
S' Lateral: 4.2 cm
Weight: 5171.11 [oz_av]

## 2023-08-27 LAB — PROTEIN / CREATININE RATIO, URINE
Creatinine, Urine: 72 mg/dL
Protein Creatinine Ratio: 1.44 mg/mg{creat} — ABNORMAL HIGH (ref 0.00–0.15)
Total Protein, Urine: 104 mg/dL

## 2023-08-27 LAB — CBC
HCT: 25.7 % — ABNORMAL LOW (ref 39.0–52.0)
Hemoglobin: 8.3 g/dL — ABNORMAL LOW (ref 13.0–17.0)
MCH: 24.3 pg — ABNORMAL LOW (ref 26.0–34.0)
MCHC: 32.3 g/dL (ref 30.0–36.0)
MCV: 75.4 fL — ABNORMAL LOW (ref 80.0–100.0)
Platelets: 200 10*3/uL (ref 150–400)
RBC: 3.41 MIL/uL — ABNORMAL LOW (ref 4.22–5.81)
RDW: 16.3 % — ABNORMAL HIGH (ref 11.5–15.5)
WBC: 7.8 10*3/uL (ref 4.0–10.5)
nRBC: 0 % (ref 0.0–0.2)

## 2023-08-27 LAB — COMPREHENSIVE METABOLIC PANEL
ALT: 17 U/L (ref 0–44)
AST: 20 U/L (ref 15–41)
Albumin: 2.1 g/dL — ABNORMAL LOW (ref 3.5–5.0)
Alkaline Phosphatase: 67 U/L (ref 38–126)
Anion gap: 12 (ref 5–15)
BUN: 65 mg/dL — ABNORMAL HIGH (ref 8–23)
CO2: 23 mmol/L (ref 22–32)
Calcium: 7.9 mg/dL — ABNORMAL LOW (ref 8.9–10.3)
Chloride: 103 mmol/L (ref 98–111)
Creatinine, Ser: 4.37 mg/dL — ABNORMAL HIGH (ref 0.61–1.24)
GFR, Estimated: 14 mL/min — ABNORMAL LOW (ref >60)
Glucose, Bld: 137 mg/dL — ABNORMAL HIGH (ref 70–99)
Potassium: 3.2 mmol/L — ABNORMAL LOW (ref 3.5–5.1)
Sodium: 138 mmol/L (ref 135–145)
Total Bilirubin: 0.7 mg/dL (ref 0.0–1.2)
Total Protein: 7.1 g/dL (ref 6.5–8.1)

## 2023-08-27 LAB — GLUCOSE, CAPILLARY
Glucose-Capillary: 119 mg/dL — ABNORMAL HIGH (ref 70–99)
Glucose-Capillary: 140 mg/dL — ABNORMAL HIGH (ref 70–99)
Glucose-Capillary: 177 mg/dL — ABNORMAL HIGH (ref 70–99)
Glucose-Capillary: 227 mg/dL — ABNORMAL HIGH (ref 70–99)

## 2023-08-27 LAB — FERRITIN: Ferritin: 179 ng/mL (ref 24–336)

## 2023-08-27 LAB — SODIUM, URINE, RANDOM: Sodium, Ur: 94 mmol/L

## 2023-08-27 LAB — IRON AND TIBC
Iron: 19 ug/dL — ABNORMAL LOW (ref 45–182)
Saturation Ratios: 7 % — ABNORMAL LOW (ref 17.9–39.5)
TIBC: 265 ug/dL (ref 250–450)
UIBC: 246 ug/dL

## 2023-08-27 MED ORDER — PERFLUTREN LIPID MICROSPHERE
1.0000 mL | INTRAVENOUS | Status: AC | PRN
Start: 2023-08-27 — End: 2023-08-27
  Administered 2023-08-27: 2 mL via INTRAVENOUS

## 2023-08-27 MED ORDER — INSULIN ASPART PROT & ASPART (70-30 MIX) 100 UNIT/ML ~~LOC~~ SUSP: 20.0000 [IU] | Freq: Two times a day (BID) | SUBCUTANEOUS | Status: DC

## 2023-08-27 MED ORDER — ENOXAPARIN SODIUM 40 MG/0.4ML IJ SOSY
40.0000 mg | PREFILLED_SYRINGE | Freq: Every day | INTRAMUSCULAR | Status: DC
  Administered 2023-08-27 – 2023-08-29 (×3): 40 mg via SUBCUTANEOUS
  Filled 2023-08-27 (×3): qty 0.4

## 2023-08-27 MED ORDER — FUROSEMIDE 10 MG/ML IJ SOLN: 80.0000 mg | Freq: Two times a day (BID) | INTRAMUSCULAR | Status: DC

## 2023-08-27 MED ORDER — POTASSIUM CHLORIDE CRYS ER 20 MEQ PO TBCR
40.0000 meq | EXTENDED_RELEASE_TABLET | Freq: Once | ORAL | Status: AC
  Administered 2023-08-27: 40 meq via ORAL
  Filled 2023-08-27: qty 2

## 2023-08-27 MED ORDER — FUROSEMIDE 10 MG/ML IJ SOLN
120.0000 mg | Freq: Three times a day (TID) | INTRAVENOUS | Status: DC
  Administered 2023-08-27 – 2023-08-30 (×10): 120 mg via INTRAVENOUS
  Filled 2023-08-27: qty 2
  Filled 2023-08-27: qty 12
  Filled 2023-08-27 (×2): qty 10
  Filled 2023-08-27: qty 2
  Filled 2023-08-27 (×3): qty 10
  Filled 2023-08-27 (×2): qty 12
  Filled 2023-08-27: qty 10
  Filled 2023-08-27: qty 12
  Filled 2023-08-27: qty 2
  Filled 2023-08-27: qty 12

## 2023-08-27 MED ORDER — INSULIN GLARGINE-YFGN 100 UNIT/ML ~~LOC~~ SOLN
25.0000 [IU] | Freq: Two times a day (BID) | SUBCUTANEOUS | Status: DC
  Administered 2023-08-27 – 2023-08-30 (×6): 25 [IU] via SUBCUTANEOUS
  Filled 2023-08-27 (×7): qty 0.25

## 2023-08-27 NOTE — Consult Note (Addendum)
Nephrology Consult   Requesting provider: Zannie Cove Service requesting consult: hospitalist Reason for consult: AKI on CKD IV   Assessment/Recommendations: Peter Terry is a/an 68 y.o. male with a past medical history HFpEF, HTN, HLD, COPD, DM 2, GERD, obesity, CKD 4 who present w/ heart Failure exacerbation, CAD by AKI   AKI on CKD 4: Baseline creatinine around 3.5.  Appears massively volume overloaded on exam.Will intensify diuretic regimen. Likely cardiorenal AKI -obtain Urine Na and RUS -diuresis as below -hold amlodipine given borderline low BPs -Continue to monitor daily Cr, Dose meds for GFR -Monitor Daily I/Os, Daily weight  -Maintain MAP>65 for optimal renal perfusion.  -Avoid nephrotoxic medications including NSAIDs -Use synthetic opioids (Fentanyl/Dilaudid) if needed -Currently no indication for HD  Acute heart failure exacerbation:, CAD by advanced CKD.  Increase Lasix 220 mg 3 times a day.  Consider the addition of metolazone tomorrow  Leukocytosis/lower extremity wounds: Wound care involved.  Management per primary team.  On antibiotics  Hypertension: Blood pressure on the lower side.  Continue Hydral/Imdur, carvedilol.  Stop amlodipine for now  Hyperlipidemia: On atorvastatin  Uncontrolled Diabetes Mellitus Type 2 with Hyperglycemia: Management per primary   Hypokalemia: replace prn  Anemia: Likely multifactorial.  Transfuse if needed will obtain iron studies   Recommendations conveyed to primary service.    Darnell Level [facility] 08/27/2023 1:48 PM   _____________________________________________________________________________________ CC: Shortness of breath  History of Present Illness: Peter Terry is a/an 68 y.o. male with a past medical history of HFpEF, HTN, HLD, COPD, DM 2, GERD, obesity, CKD 4 who presents with Shortness of breath.  Patient states his had worsening shortness of breath for 2-3 days prior to arrival.   He  admits to not taking very good care of himself at home.  He takes care of his father at home and neglects most of his care.  He has a hard time following up her providers consistently.  He knows he is supposed to be watching his weight closely at home but does not.  He does feel like he has had worsening lower extremity edema as well as total body volume overload for several days prior to arrival.  The shortness of breath he was experiencing worsened which led to him coming to the hospital yesterday.He denies fevers, chills, nausea, vomiting, diarrhea.  Did have some chest pain.  Also has had worsening wound on his lower extremity. Workup in the ER demonstrated persistent volume overload.  Patient continues to feel short of breath today. He follows with Dr. Lequita Halt and BL crt around 3.5. Crt 4.4 today   Medications:  Current Facility-Administered Medications  Medication Dose Route Frequency Provider Last Rate Last Admin   acetaminophen (TYLENOL) tablet 650 mg  650 mg Oral Q6H PRN Synetta Fail, MD       Or   acetaminophen (TYLENOL) suppository 650 mg  650 mg Rectal Q6H PRN Synetta Fail, MD       albuterol (PROVENTIL) (2.5 MG/3ML) 0.083% nebulizer solution 2.5 mg  2.5 mg Inhalation Q4H PRN Synetta Fail, MD       atorvastatin (LIPITOR) tablet 80 mg  80 mg Oral QHS Synetta Fail, MD   80 mg at 08/26/23 2203   carvedilol (COREG) tablet 25 mg  25 mg Oral BID Synetta Fail, MD   25 mg at 08/27/23 0957   cefTRIAXone (ROCEPHIN) 2 g in sodium chloride 0.9 % 100 mL IVPB  2 g Intravenous Q24H Alinda Money,  Cecille Po, MD 200 mL/hr at 08/26/23 1542 2 g at 08/26/23 1542   And   metroNIDAZOLE (FLAGYL) IVPB 500 mg  500 mg Intravenous Q12H Synetta Fail, MD   Stopped at 08/27/23 0426   enoxaparin (LOVENOX) injection 40 mg  40 mg Subcutaneous QHS Silvana Newness, RPH       ferrous sulfate tablet 325 mg  325 mg Oral TID WC Synetta Fail, MD   325 mg at 08/27/23 1337    furosemide (LASIX) 120 mg in dextrose 5 % 50 mL IVPB  120 mg Intravenous TID Darnell Level, MD       hydrALAZINE (APRESOLINE) tablet 100 mg  100 mg Oral TID Synetta Fail, MD   100 mg at 08/27/23 0957   insulin aspart (novoLOG) injection 0-15 Units  0-15 Units Subcutaneous TID WC Synetta Fail, MD   2 Units at 08/27/23 1336   insulin aspart (novoLOG) injection 0-5 Units  0-5 Units Subcutaneous QHS Synetta Fail, MD       insulin glargine-yfgn [facility]) injection 25 Units  25 Units Subcutaneous BID Zannie Cove, MD       isosorbide mononitrate (IMDUR) 24 hr tablet 60 mg  60 mg Oral Daily Synetta Fail, MD   60 mg at 08/27/23 0957   pantoprazole (PROTONIX) EC tablet 40 mg  40 mg Oral Daily Synetta Fail, MD   40 mg at 08/27/23 0957   polyethylene glycol (MIRALAX / GLYCOLAX) packet 17 g  17 g Oral Daily PRN Synetta Fail, MD       sodium chloride flush (NS) 0.9 % injection 3 mL  3 mL Intravenous Q12H Synetta Fail, MD   3 mL at 08/27/23 1007   traMADol (ULTRAM) tablet 50 mg  50 mg Oral Q8H PRN Synetta Fail, MD   50 mg at 08/26/23 1018     ALLERGIES Patient has no known allergies.  MEDICAL HISTORY Past Medical History:  Diagnosis Date   Acute heart failure with preserved ejection fraction (HFpEF) (HCC) 05/02/2023   CHF (congestive heart failure) (HCC)    Chronic kidney disease, stage 4 (severe) (HCC) 03/31/2023   Chronic right hip pain 07/13/2023   COPD (chronic obstructive pulmonary disease) (HCC) 03/30/2023   Diabetic peripheral neuropathy (HCC) 03/31/2023   Dyspnea on exertion 05/02/2023   Edema of both lower legs 03/31/2023   Essential hypertension 03/30/2023   GERD (gastroesophageal reflux disease) 03/31/2023   Heart Failure with Preserved EF (HCC) 03/31/2023   Hypomagnesemia 11/04/2022   Insulin long-term use (HCC) 08/21/2020   Iron deficiency anemia 03/31/2022   Mixed hyperlipidemia 03/30/2023   Morbid obesity with BMI of  45.0-49.9, adult (HCC) 07/25/2020   Morbid obesity with body mass index (BMI) of 50.0 to 59.9 in adult [facility]) 07/25/2020   Multiple open wounds of lower extremity 05/02/2023   Obstructive sleep apnea 03/31/2023   Retinal tear 02/20/2013   Tinea pedis 03/31/2023   Type 2 diabetes mellitus with hyperglycemia, with long-term current use of insulin (HCC) 05/02/2023   Type 2 diabetes mellitus with stage 4 chronic kidney disease, with long-term current use of insulin (HCC) 11/04/2022   Venous stasis dermatitis 03/31/2023   Venous stasis ulcer of left calf limited to breakdown of skin without varicose veins (HCC) 07/13/2023     SOCIAL HISTORY Social History   Socioeconomic History   Marital status: Single    Spouse name: Not on file   Number of children: 1   Years  of education: Not on file   Highest education level: GED or equivalent  Occupational History   Occupation: Air cabin crew Delivery  Tobacco Use   Smoking status: Former    Types: Cigarettes   Smokeless tobacco: Not on file  Vaping Use   Vaping status: Never Used  Substance and Sexual Activity   Alcohol use: No   Drug use: No   Sexual activity: Not Currently  Other Topics Concern   Not on file  Social History Narrative   Not on file   Social Drivers of Health   Financial Resource Strain: Low Risk  (05/26/2023)   Overall Financial Resource Strain (CARDIA)    Difficulty of Paying Living Expenses: Not very hard  Food Insecurity: No Food Insecurity (08/26/2023)   Hunger Vital Sign    Worried About Running Out of Food in the Last Year: Never true    Ran Out of Food in the Last Year: Never true  Transportation Needs: No Transportation Needs (08/26/2023)   PRAPARE - Administrator, Civil Service (Medical): No    Lack of Transportation (Non-Medical): No  Physical Activity: Insufficiently Active (05/26/2023)   Exercise Vital Sign    Days of Exercise per Week: 2 days    Minutes of Exercise per Session: 20 min   Stress: Stress Concern Present (05/26/2023)   [employer] of Occupational Health - Occupational Stress Questionnaire    Feeling of Stress : To some extent  Social Connections: Socially Isolated (08/26/2023)   Social Connection and Isolation Panel [NHANES]    Frequency of Communication with Friends and Family: More than three times a week    Frequency of Social Gatherings with Friends and Family: Three times a week    Attends Religious Services: Never    Active Member of Clubs or Organizations: No    Attends Banker Meetings: Never    Marital Status: Divorced  Catering manager Violence: Not At Risk (08/26/2023)   Humiliation, Afraid, Rape, and Kick questionnaire    Fear of Current or Ex-Partner: No    Emotionally Abused: No    Physically Abused: No    Sexually Abused: No     FAMILY HISTORY Family History  Problem Relation Age of Onset   Cancer Mother    Diabetes Mother    Kidney disease Mother    Heart disease Father    Cancer Father        Colon   Kidney disease Father    Diabetes Father    Diabetes Brother    Heart disease Paternal Aunt       Review of Systems: 12 systems reviewed Otherwise as per HPI, all other systems reviewed and negative  Physical Exam: Vitals:   08/27/23 0819 08/27/23 1257  BP: (!) 153/77 (!) 121/58  Pulse: 79 75  Resp: 18 (!) 23  Temp: 98.6 F (37 C) 98.7 F (37.1 C)  SpO2:     Total I/O In: 480 [P.O.:480] Out: 1300 [Urine:1300]  Intake/Output Summary (Last 24 hours) at 08/27/2023 1348 Last data filed at 08/27/2023 1305 Gross per 24 hour  Intake 1260 ml  Output 2700 ml  Net -1440 ml   General: Obese, no distress HEENT: anicteric sclera, oropharynx clear without lesions CV: Normal rate, no rub, 2+ pitting edema to bilateral lower extremities Lungs: Thank crackles present, distant lung sounds, mild increased work of breathing Abd: soft, non-tender, Moderately distended Skin: no visible lesions or rashes, Wound  present on left lower extremity Psych: alert, engaged, appropriate  mood and affect Musculoskeletal: no obvious deformities Neuro: normal speech, no gross focal deficits   Test Results Reviewed Lab Results  Component Value Date   NA 138 08/27/2023   K 3.2 (L) 08/27/2023   CL 103 08/27/2023   CO2 23 08/27/2023   BUN 65 (H) 08/27/2023   CREATININE 4.37 (H) 08/27/2023   GFR 13.47 (LL) 05/12/2023   CALCIUM 7.9 (L) 08/27/2023   ALBUMIN 2.1 (L) 08/27/2023    CBC Recent Labs  Lab 08/25/23 2021 08/26/23 1553 08/27/23 0514  WBC 18.9* 10.6* 7.8  NEUTROABS 17.3*  --   --   HGB 9.2* 8.4* 8.3*  HCT 28.5* 26.2* 25.7*  MCV 75.4* 75.9* 75.4*  PLT 214 197 200    I have reviewed all relevant outside healthcare records related to the patient's current hospitalization

## 2023-08-27 NOTE — Progress Notes (Signed)
PROGRESS NOTE    Peter Terry  CWC:[id number] DOB: 1956/05/06 DOA: 08/25/2023 PCP: [name], MD  67/M w chronic diastolic CHF, COPD, hypertension, hyperlipidemia, diabetes, CKD 4, GERD, obesity, OSA presented to the ED with progressive dyspnea, followed by cardiology and nephrology, recently maintained on Bumex with metolazone every other day -Also has chronic wounds for over a year, worse in the right lower extremity -In the ED tachycardic, BUN 64, creatinine 4, WBC of 18.9, hemoglobin 9.2, troponin 46, 51, chest x-ray with cardiomegaly and pulmonary vascular congestion  Subjective: -Feels fair, no events overnight, right lower leg wound just dressed by wound RN  Assessment and Plan:  Acute on chronic diastolic CHF Volume overload, severe hypoalbuminemia -Last echo 9/24 at [facility] showed EF 60-65%, normal RV function. -Significantly volume overloaded and complicated by AKI/CKD 4 -Continue IV Lasix increased dose to 80 Mg twice daily -Continue amlodipine, Coreg, hydralazine -Discontinue losartan  Leukocytosis Extremity wounds -Chronic wounds worse on right lower extremity for> 1 year -Followed by vascular felt to be venous insufficiency wounds -With drainage, erythema warmth and foul smell -Started on ceftriaxone and Flagyl on admission yesterday -ESR and CRP are significantly elevated raising concern for deeper infection -Will obtain MRI without contrast -Wound care following  AKI on CKD 4 -Baseline creatinine fluctuating from 2.7-3.3 range, now 4.3 -Cardiorenal, discontinue losartan, avoid hypotension -Check renal ultrasound -Will request nephrology input as well   COPD - Continue as needed albuterol   Hypertension - Continue amlodipine, carvedilol, hydralazine, Imdur,  - Lasix as above   Hyperlipidemia - Continue atorvastatin   Diabetes -Decrease insulin dose, on 70/30 along with glargine    GERD - Continue PPI   Obesity - Noted   OSA - Intolerant  to CPAP   DVT prophylaxis:      Lovenox Code Status:              Full Family Communication:       None on admission  Disposition Plan:   Consultants:    Procedures:   Antimicrobials:    Objective: Vitals:   08/26/23 2018 08/27/23 0053 08/27/23 0326 08/27/23 0819  BP: (!) 125/57 134/65 (!) 115/57 (!) 153/77  Pulse: 78 79 78 79  Resp: 20 16 18 18   Temp: 98.9 F (37.2 C) 98.6 F (37 C) 99.2 F (37.3 C) 98.6 F (37 C)  TempSrc: Oral Oral Oral Axillary  SpO2: 94% 96% 94%   Weight:   (!) 146.6 kg   Height:        Intake/Output Summary (Last 24 hours) at 08/27/2023 0958 Last data filed at 08/27/2023 7616 Gross per 24 hour  Intake 1260 ml  Output 2000 ml  Net -740 ml   Filed Weights   08/25/23 1953 08/26/23 1345 08/27/23 0326  Weight: (!) 152.9 kg (!) 145.9 kg (!) 146.6 kg    Examination:  General exam: Obese chronically ill male sitting up in bed, AAOx3 HEENT: Positive JVD CVS: S1-S2, regular rhythm Lungs: Decreased breath sounds to bases Abdomen: Soft, obese, nontender, bowel sounds present Extremities: 1-2+ edema, bilateral lower extremity wounds, large ulcer on the right lower leg Psychiatry:  Mood & affect appropriate.     Data Reviewed:   CBC: Recent Labs  Lab 08/25/23 2021 08/26/23 1553 08/27/23 0514  WBC 18.9* 10.6* 7.8  NEUTROABS 17.3*  --   --   HGB 9.2* 8.4* 8.3*  HCT 28.5* 26.2* 25.7*  MCV 75.4* 75.9* 75.4*  PLT 214 197 200   Basic Metabolic  Panel: Recent Labs  Lab 08/25/23 2021 08/26/23 1553 08/27/23 0514  NA 137 141 138  K 3.8 3.4* 3.2*  CL 104 106 103  CO2 23 25 23   GLUCOSE 288* 103* 137*  BUN 64* 59* 65*  CREATININE 3.96* 4.06* 4.37*  CALCIUM 8.4* 8.5* 7.9*  MG 1.7 1.8  --    GFR: Estimated Creatinine Clearance: 24.1 mL/min (A) (by C-G formula based on SCr of 4.37 mg/dL (H)). Liver Function Tests: Recent Labs  Lab 08/26/23 1553 08/27/23 0514  AST 18 20  ALT 14 17  ALKPHOS 71 67  BILITOT 0.7 0.7  PROT 7.7 7.1   ALBUMIN 2.3* 2.1*   No results for input(s): "LIPASE", "AMYLASE" in the last 168 hours. No results for input(s): "AMMONIA" in the last 168 hours. Coagulation Profile: No results for input(s): "INR", "PROTIME" in the last 168 hours. Cardiac Enzymes: No results for input(s): "CKTOTAL", "CKMB", "CKMBINDEX", "TROPONINI" in the last 168 hours. BNP (last 3 results) No results for input(s): "PROBNP" in the last 8760 hours. HbA1C: No results for input(s): "HGBA1C" in the last 72 hours. CBG: Recent Labs  Lab 08/26/23 0924 08/26/23 1157 08/26/23 1619 08/26/23 2135 08/27/23 0731  GLUCAP 145* 154* 91 144* 119*   Lipid Profile: No results for input(s): "CHOL", "HDL", "LDLCALC", "TRIG", "CHOLHDL", "LDLDIRECT" in the last 72 hours. Thyroid Function Tests: No results for input(s): "TSH", "T4TOTAL", "FREET4", "T3FREE", "THYROIDAB" in the last 72 hours. Anemia Panel: No results for input(s): "VITAMINB12", "FOLATE", "FERRITIN", "TIBC", "IRON", "RETICCTPCT" in the last 72 hours. Urine analysis:    Component Value Date/Time   COLORURINE YELLOW 08/25/2023 2252   APPEARANCEUR CLEAR 08/25/2023 2252   LABSPEC 1.015 08/25/2023 2252   PHURINE 6.5 08/25/2023 2252   GLUCOSEU 100 (A) 08/25/2023 2252   HGBUR TRACE (A) 08/25/2023 2252   BILIRUBINUR NEGATIVE 08/25/2023 2252   KETONESUR NEGATIVE 08/25/2023 2252   PROTEINUR >=300 (A) 08/25/2023 2252   NITRITE NEGATIVE 08/25/2023 2252   LEUKOCYTESUR NEGATIVE 08/25/2023 2252   Sepsis Labs: @LABRCNTIP (procalcitonin:4,lacticidven:4)  ) Recent Results (from the past 240 hours)  Resp panel by RT-PCR (RSV, Flu A&B, Covid) Anterior Nasal Swab     Status: None   Collection Time: 08/25/23  8:21 PM   Specimen: Anterior Nasal Swab  Result Value Ref Range Status   SARS Coronavirus 2 by RT PCR NEGATIVE NEGATIVE Final    Comment: (NOTE) SARS-CoV-2 target nucleic acids are NOT DETECTED.  The SARS-CoV-2 RNA is generally detectable in upper  respiratory specimens during the acute phase of infection. The lowest concentration of SARS-CoV-2 viral copies this assay can detect is 138 copies/mL. A negative result does not preclude SARS-Cov-2 infection and should not be used as the sole basis for treatment or other patient management decisions. A negative result may occur with  improper specimen collection/handling, submission of specimen other than nasopharyngeal swab, presence of viral mutation(s) within the areas targeted by this assay, and inadequate number of viral copies(<138 copies/mL). A negative result must be combined with clinical observations, patient history, and epidemiological information. The expected result is Negative.  Fact Sheet for Patients:  BloggerCourse.com  Fact Sheet for Healthcare Providers:  SeriousBroker.it  This test is no t yet approved or cleared by the [place] FDA and  has been authorized for detection and/or diagnosis of SARS-CoV-2 by FDA under an Emergency Use Authorization (EUA). This EUA will remain  in effect (meaning this test can be used) for the duration of the COVID-19 declaration under Section 564(b)(1) of the  Act, 21 U.S.C.section 360bbb-3(b)(1), unless the authorization is terminated  or revoked sooner.       Influenza A by PCR NEGATIVE NEGATIVE Final   Influenza B by PCR NEGATIVE NEGATIVE Final    Comment: (NOTE) The Xpert Xpress SARS-CoV-2/FLU/RSV plus assay is intended as an aid in the diagnosis of influenza from Nasopharyngeal swab specimens and should not be used as a sole basis for treatment. Nasal washings and aspirates are unacceptable for Xpert Xpress SARS-CoV-2/FLU/RSV testing.  Fact Sheet for Patients: BloggerCourse.com  Fact Sheet for Healthcare Providers: SeriousBroker.it  This test is not yet approved or cleared by the [place] FDA and has been  authorized for detection and/or diagnosis of SARS-CoV-2 by FDA under an Emergency Use Authorization (EUA). This EUA will remain in effect (meaning this test can be used) for the duration of the COVID-19 declaration under Section 564(b)(1) of the Act, 21 U.S.C. section 360bbb-3(b)(1), unless the authorization is terminated or revoked.     Resp Syncytial Virus by PCR NEGATIVE NEGATIVE Final    Comment: (NOTE) Fact Sheet for Patients: BloggerCourse.com  Fact Sheet for Healthcare Providers: SeriousBroker.it  This test is not yet approved or cleared by the [place] FDA and has been authorized for detection and/or diagnosis of SARS-CoV-2 by FDA under an Emergency Use Authorization (EUA). This EUA will remain in effect (meaning this test can be used) for the duration of the COVID-19 declaration under Section 564(b)(1) of the Act, 21 U.S.C. section 360bbb-3(b)(1), unless the authorization is terminated or revoked.  Performed at [facility], [address] [place], [place] [address]      Radiology Studies: DG Chest [place] 1 View Result Date: 08/25/2023 CLINICAL DATA:  Shortness of breath. EXAM: PORTABLE CHEST 1 VIEW COMPARISON:  05/02/2023 FINDINGS: Mild cardiomegaly. Unchanged mediastinal contours. Aortic atherosclerosis. Vascular congestion. No focal airspace disease. No large pleural effusion. No pneumothorax. On limited assessment, no acute osseous findings. IMPRESSION: Mild cardiomegaly with vascular congestion. Electronically Signed   By: Narda Rutherford M.D.   On: 08/25/2023 20:41     Scheduled Meds:  amLODipine  10 mg Oral q morning   atorvastatin  80 mg Oral QHS   carvedilol  25 mg Oral BID   enoxaparin (LOVENOX) injection  70 mg Subcutaneous Q24H   ferrous sulfate  325 mg Oral TID WC   furosemide  40 mg Intravenous BID   hydrALAZINE  100 mg Oral TID   insulin aspart  0-15 Units Subcutaneous TID WC   insulin  aspart  0-5 Units Subcutaneous QHS   insulin aspart protamine- aspart  30 Units Subcutaneous BID WC   insulin glargine-yfgn  30 Units Subcutaneous BID   isosorbide mononitrate  60 mg Oral Daily   losartan  100 mg Oral Daily   pantoprazole  40 mg Oral Daily   potassium chloride  10 mEq Oral Daily   sodium chloride flush  3 mL Intravenous Q12H   Continuous Infusions:  cefTRIAXone (ROCEPHIN)  IV 2 g (08/26/23 1542)   And   metronidazole Stopped (08/27/23 0426)     LOS: 1 day    Time spent:    Zannie Cove, MD [facility]   08/27/2023, 9:58 AM

## 2023-08-27 NOTE — Progress Notes (Signed)
  Echocardiogram 2D Echocardiogram has been performed.  Leda Roys RDCS 08/27/2023, 11:49 AM

## 2023-08-27 NOTE — Progress Notes (Signed)
Heart Failure Navigator Progress Note  Assessed for Heart & Vascular TOC clinic readiness.   Patient with history of diastolic CHF with LVEF 60-65%. Has AKI on CKD IV. No HF TOC per Dr. Jomarie Longs.   Navigator available for reassessment of patient.   Sharen Hones, PharmD, BCPS Heart Failure Stewardship Pharmacist Phone [phone]

## 2023-08-27 NOTE — Progress Notes (Signed)
PT Cancellation Note  Patient Details Name: Peter Terry MRN: [id number] DOB: [birth year]   Cancelled Treatment:    Reason Eval/Treat Not Completed: Patient at procedure or test/unavailable (MRI). Will follow up for PT Evaluation as schedule permits.  Ina Homes, PT, DPT Acute Rehabilitation Services  Personal: Secure Chat Rehab Office: [phone]  Malachy Chamber 08/27/2023, 12:01 PM

## 2023-08-27 NOTE — Evaluation (Signed)
Occupational Therapy Evaluation Patient Details Name: Peter Terry MRN: [id number] DOB: October 01, 1955 Today's Date: 08/27/2023   History of Present Illness 68 y.o. male admitted 08/25/23 with SOB, worsening chronic LE wounds. Workup for CHF. PMH includes CHF, COPD, HTN, DM, CKD 4, OSA, obesity.   Clinical Impression   Pt presents with decline in function and safety with ADLs and ADL mobility with impaired strength, balance and endurance; pt with SOB with minimal activity with O2 SATs decreasing to 8^ after sitting EOB. Pt instructed on pursed lip deep breathing and quickly recovered to 92% SATs. PTA pt lived with his father in a 1 story home with 2 STE. Pt reports that he is Ind with ADLs/selfcare, IADLs, drives, works full time at night. Pt reports that previously, he gets SOB at times and requires more time to complete ADLs; pt reports that he has a pulse ox at home and that he uses no AD for mobility. Pt currently requires Sup with increase time and effort to sit EOB, mod A with LB ADLs and min A with mobility/transfers using RW. Recommend f/u HH OT (depending on acute stay progress) once d/c home, howveve rot states that he will "think about it". Pt would benefit from acute OT services to address impairments to maximize level of function and safety      If plan is discharge home, recommend the following: A lot of help with bathing/dressing/bathroom;A little help with walking and/or transfers;Assistance with cooking/housework;Help with stairs or ramp for entrance;Assist for transportation    Functional Status Assessment  Patient has had a recent decline in their functional status and demonstrates the ability to make significant improvements in function in a reasonable and predictable amount of time.  Equipment Recommendations  Tub/shower bench    Recommendations for Other Services       Precautions / Restrictions Precautions Precautions: Fall;Other (comment) Precaution Comments: watch O2  SATs Restrictions Weight Bearing Restrictions Per Provider Order: No      Mobility Bed Mobility Overal bed mobility: Needs Assistance Bed Mobility: Supine to Sit, Sit to Supine     Supine to sit: Supervision, HOB elevated, Used rails Sit to supine: Supervision   General bed mobility comments: increased time and effort, no physical assist    Transfers Overall transfer level: Needs assistance Equipment used: Rolling walker (2 wheels) Transfers: Sit to/from Stand Sit to Stand: Min assist           General transfer comment: pt fatigues easily, was able to side step along EOB      Balance Overall balance assessment: Mild deficits observed, not formally tested                                         ADL either performed or assessed with clinical judgement   ADL Overall ADL's : Needs assistance/impaired Eating/Feeding: Independent;Sitting   Grooming: Wash/dry hands;Wash/dry face;Supervision/safety;Sitting   Upper Body Bathing: Supervision/ safety;Sitting   Lower Body Bathing: Moderate assistance   Upper Body Dressing : Supervision/safety;Sitting   Lower Body Dressing: Moderate assistance   Toilet Transfer: Minimal assistance;Rolling walker (2 wheels);Stand-pivot   Toileting- Clothing Manipulation and Hygiene: Minimal assistance;Sit to/from stand       Functional mobility during ADLs: Minimal assistance;Rolling walker (2 wheels)       Vision Baseline Vision/History: 1 Wears glasses Ability to See in Adequate Light: 0 Adequate Patient Visual Report: No change from baseline  Perception         Praxis         Pertinent Vitals/Pain Pain Assessment Pain Assessment: No/denies pain     Extremity/Trunk Assessment Upper Extremity Assessment Upper Extremity Assessment: Generalized weakness   Lower Extremity Assessment Lower Extremity Assessment: Defer to PT evaluation   Cervical / Trunk Assessment Cervical / Trunk Assessment:  Other exceptions Cervical / Trunk Exceptions: body habitus   Communication Communication Communication: No apparent difficulties   Cognition Arousal: Alert Behavior During Therapy: WFL for tasks assessed/performed, Flat affect Overall Cognitive Status: Within Functional Limits for tasks assessed                                       General Comments       Exercises     Shoulder Instructions      Home Living Family/patient expects to be discharged to:: Private residence Living Arrangements: Parent Available Help at Discharge: Family Type of Home: House Home Access: Stairs to enter Secretary/administrator of Steps: 2 Entrance Stairs-Rails: Right;Left Home Layout: One level     Bathroom Shower/Tub: Chief Strategy Officer: Standard Bathroom Accessibility: No   Home Equipment: Other (comment)   Additional Comments: pt reports that he has a pulse ox at home      Prior Functioning/Environment               Mobility Comments: Pt reports that he uses no AD ADLs Comments: Pt reports that he is Ind with ADLs/selfcare, IADLs, drives, works full time at night. Pt reports that he gets SOB at times and requires more time to complete ADLs.        OT Problem List: Decreased strength;Impaired balance (sitting and/or standing);Decreased activity tolerance;Decreased knowledge of use of DME or AE      OT Treatment/Interventions: Self-care/ADL training;DME and/or AE instruction;Therapeutic activities;Patient/family education;Energy conservation    OT Goals(Current goals can be found in the care plan section) Acute Rehab OT Goals Patient Stated Goal: go home OT Goal Formulation: With patient Time For Goal Achievement: 08/27/23 Potential to Achieve Goals: Good ADL Goals Pt Will Perform Grooming: with contact guard assist;with supervision;standing Pt Will Perform Upper Body Bathing: with set-up;with modified independence Pt Will Perform Lower Body  Bathing: with min assist;with contact guard assist;sitting/lateral leans Pt Will Perform Upper Body Dressing: with set-up;with modified independence Pt Will Perform Lower Body Dressing: with min assist;with contact guard assist Pt Will Transfer to Toilet: with contact guard assist;with supervision;ambulating Pt Will Perform Toileting - Clothing Manipulation and hygiene: with contact guard assist;with supervision;sit to/from stand Pt Will Perform Tub/Shower Transfer: with min assist;with contact guard assist;ambulating;tub bench;3 in 1;grab bars;rolling walker Additional ADL Goal #1: Pt wi;; verbalize and demo 3 energy conservation strategies for ADLs and ADL mobility  OT Frequency: Min 2X/week    Co-evaluation              AM-PAC OT "6 Clicks" Daily Activity     Outcome Measure Help from another person eating meals?: None Help from another person taking care of personal grooming?: A Little Help from another person toileting, which includes using toliet, bedpan, or urinal?: A Little Help from another person bathing (including washing, rinsing, drying)?: A Lot Help from another person to put on and taking off regular upper body clothing?: A Little Help from another person to put on and taking off regular lower body clothing?: A  Lot 6 Click Score: 17   End of Session Equipment Utilized During Treatment: Gait belt;Rolling walker (2 wheels) Nurse Communication: Mobility status  Activity Tolerance: Patient limited by fatigue Patient left: in bed  OT Visit Diagnosis: Muscle weakness (generalized) (M62.81)                Time: 8295-6213 OT Time Calculation (min): 24 min Charges:  OT General Charges $OT Visit: 1 Visit OT Evaluation $OT Eval Moderate Complexity: 1 Mod OT Treatments $Therapeutic Activity: 8-22 mins    Galen Manila 08/27/2023, 2:12 PM

## 2023-08-27 NOTE — Consult Note (Signed)
WOC Nurse Consult Note: patient with longstanding history of lymphedema; has been followed in [facility] for wounds related to venous insufficiency, was last seen there 05/2023 at which time wounds were healed; patient saw vascular surgery 06/2023; says he is unable to wear compression stockings as they prescribed as he can not get them on  Reason for Consult: RLE wound  Wound type: full thickness r/t venous insufficiency  Pressure Injury POA:  Measurement: 10 cm x 5 cm total area of hyperkeratotic tissue that is weeping; has full thickness opening 1 cm x 1 cm x 0.1 cm at most distal portion of that wound and 0.5 cm x 0.5 cm x 0.1 cm opening right above heel Wound bed: pink moist  Drainage (amount, consistency, odor) weeping serosanguinous fluid  Periwound: chronic edema, dry scaly skin  Dressing procedure/placement/frequency: Cleanse R lower leg with Vashe wound cleanser [employer] [place] [phone]), apply silver hydrofiber [employer] [place] [phone]) to R lateral leg wound daily, cover with ABD and secure with Kerlix roll gauze beginning just above toes and ending right below knee.  Apply Ace bandage to area to secure dressing and apply light compression.  Patient should keep legs elevated as much as possible.   I assessed the left leg, dry hyperkeratotic skin but no open weeping wounds noted.  Patient is using Eucerin on that leg which I recommended he continue.    Patient would likely benefit from follow-up with wound care center at discharge.    POC discussed with bedside nurse. WOC team will not follow. Re-consult if further needs arise.   Thank you,    Priscella Mann MSN, RN-BC, [employer] [phone]

## 2023-08-28 DIAGNOSIS — I5033 Acute on chronic diastolic (congestive) heart failure: Secondary | ICD-10-CM | POA: Diagnosis not present

## 2023-08-28 LAB — COMPREHENSIVE METABOLIC PANEL
ALT: 17 U/L (ref 0–44)
AST: 20 U/L (ref 15–41)
Albumin: 2.1 g/dL — ABNORMAL LOW (ref 3.5–5.0)
Alkaline Phosphatase: 65 U/L (ref 38–126)
Anion gap: 14 (ref 5–15)
BUN: 67 mg/dL — ABNORMAL HIGH (ref 8–23)
CO2: 23 mmol/L (ref 22–32)
Calcium: 8 mg/dL — ABNORMAL LOW (ref 8.9–10.3)
Chloride: 101 mmol/L (ref 98–111)
Creatinine, Ser: 4.36 mg/dL — ABNORMAL HIGH (ref 0.61–1.24)
GFR, Estimated: 14 mL/min — ABNORMAL LOW (ref >60)
Glucose, Bld: 163 mg/dL — ABNORMAL HIGH (ref 70–99)
Potassium: 3.3 mmol/L — ABNORMAL LOW (ref 3.5–5.1)
Sodium: 138 mmol/L (ref 135–145)
Total Bilirubin: 0.4 mg/dL (ref 0.0–1.2)
Total Protein: 7.2 g/dL (ref 6.5–8.1)

## 2023-08-28 LAB — CBC
HCT: 25.4 % — ABNORMAL LOW (ref 39.0–52.0)
Hemoglobin: 8.2 g/dL — ABNORMAL LOW (ref 13.0–17.0)
MCH: 24.1 pg — ABNORMAL LOW (ref 26.0–34.0)
MCHC: 32.3 g/dL (ref 30.0–36.0)
MCV: 74.7 fL — ABNORMAL LOW (ref 80.0–100.0)
Platelets: 196 10*3/uL (ref 150–400)
RBC: 3.4 MIL/uL — ABNORMAL LOW (ref 4.22–5.81)
RDW: 16.1 % — ABNORMAL HIGH (ref 11.5–15.5)
WBC: 8.3 10*3/uL (ref 4.0–10.5)
nRBC: 0 % (ref 0.0–0.2)

## 2023-08-28 LAB — GLUCOSE, CAPILLARY
Glucose-Capillary: 177 mg/dL — ABNORMAL HIGH (ref 70–99)
Glucose-Capillary: 185 mg/dL — ABNORMAL HIGH (ref 70–99)
Glucose-Capillary: 219 mg/dL — ABNORMAL HIGH (ref 70–99)
Glucose-Capillary: 231 mg/dL — ABNORMAL HIGH (ref 70–99)

## 2023-08-28 MED ORDER — ISOSORBIDE MONONITRATE ER 30 MG PO TB24
30.0000 mg | ORAL_TABLET | Freq: Every day | ORAL | Status: DC
  Administered 2023-08-29 – 2023-09-03 (×6): 30 mg via ORAL
  Filled 2023-08-28 (×6): qty 1

## 2023-08-28 MED ORDER — HYDRALAZINE HCL 50 MG PO TABS
75.0000 mg | ORAL_TABLET | Freq: Three times a day (TID) | ORAL | Status: DC
  Administered 2023-08-28 – 2023-09-03 (×18): 75 mg via ORAL
  Filled 2023-08-28 (×18): qty 1

## 2023-08-28 MED ORDER — POTASSIUM CHLORIDE CRYS ER 20 MEQ PO TBCR
40.0000 meq | EXTENDED_RELEASE_TABLET | Freq: Two times a day (BID) | ORAL | Status: AC
  Administered 2023-08-28 (×2): 40 meq via ORAL
  Filled 2023-08-28 (×2): qty 2

## 2023-08-28 MED ORDER — POTASSIUM CHLORIDE CRYS ER 20 MEQ PO TBCR
40.0000 meq | EXTENDED_RELEASE_TABLET | Freq: Once | ORAL | Status: AC
  Administered 2023-08-28: 40 meq via ORAL
  Filled 2023-08-28: qty 2

## 2023-08-28 MED ORDER — SODIUM CHLORIDE 0.9 % IV SOLN
200.0000 mg | INTRAVENOUS | Status: AC
  Administered 2023-08-28 – 2023-09-01 (×5): 200 mg via INTRAVENOUS
  Filled 2023-08-28 (×5): qty 10

## 2023-08-28 MED ORDER — SPIRONOLACTONE 25 MG PO TABS
25.0000 mg | ORAL_TABLET | Freq: Every day | ORAL | Status: DC
  Administered 2023-08-28 – 2023-08-31 (×4): 25 mg via ORAL
  Filled 2023-08-28 (×4): qty 1

## 2023-08-28 NOTE — Progress Notes (Signed)
PROGRESS NOTE    Peter Terry  OZH:[id number] DOB: [birth year] DOA: 08/25/2023 PCP: [name], MD  67/M w chronic diastolic CHF, COPD, hypertension, hyperlipidemia, diabetes, CKD 4, GERD, obesity, OSA presented to the ED with progressive dyspnea, followed by cardiology and nephrology, recently maintained on Bumex with metolazone every other day -Also has chronic wounds for over a year, worse in the right lower extremity -In the ED tachycardic, BUN 64, creatinine 4, WBC of 18.9, hemoglobin 9.2, troponin 46, 51, chest x-ray with cardiomegaly and pulmonary vascular congestion  Subjective: -Feels a little better overall, breathing is improving  Assessment and Plan:  Acute on chronic diastolic CHF Volume overload, severe hypoalbuminemia -Last echo 9/24 at [facility] showed EF 60-65%, normal RV function. -volume overloaded and complicated by AKI/CKD 4 -Diuresed more yesterday, appreciate nephrology input, 4.3 L negative, weight down 19 LB, creatinine largely stable at 4.3 -Continue IV Lasix, Coreg, decrease hydralazine, stop amlodipine -Discontinue losartan -Increase activity, PT OT  Right lower extremity cellulitis, superficial wounds -Chronic wounds worse on right lower extremity for> 1 year -Followed by vascular felt to be venous insufficiency wounds -With mild drainage, erythema warmth and foul smell -Started on ceftriaxone and Flagyl on admission, day 2 -ESR and CRP were significantly elevated, fortunately MRI was reassuring without deep infection -Continue wound care  AKI on CKD 4 -Baseline creatinine fluctuating from 2.7-3.3 range, now 4.3 -Cardiorenal, discontinued losartan, avoid hypotension -Renal ultrasound without hydronephrosis   COPD - Continue PRN albuterol   Hypertension - Continue carvedilol, hydralazine, Imdur,  - Lasix as above   Hyperlipidemia - Continue atorvastatin   Diabetes -Decrease insulin dose, continue glargine 25 units    GERD - Continue  PPI   Obesity - Noted   OSA - Intolerant to CPAP   DVT prophylaxis:      Lovenox Code Status:              Full Family Communication:       None on admission  Disposition Plan:   Consultants:    Procedures:   Antimicrobials:    Objective: Vitals:   08/27/23 2355 08/28/23 0455 08/28/23 0740 08/28/23 1108  BP: (!) 142/71 (!) 140/71  (!) 142/77  Pulse: 80 73 72 71  Resp: 19 18  18   Temp: 98.4 F (36.9 C) 98.6 F (37 C)  98.5 F (36.9 C)  TempSrc: Oral Oral  Oral  SpO2: 97% 95%  100%  Weight:  (!) 144.3 kg    Height:        Intake/Output Summary (Last 24 hours) at 08/28/2023 1117 Last data filed at 08/28/2023 1107 Gross per 24 hour  Intake 884 ml  Output 4950 ml  Net -4066 ml   Filed Weights   08/26/23 1345 08/27/23 0326 08/28/23 0455  Weight: (!) 145.9 kg (!) 146.6 kg (!) 144.3 kg    Examination:  General exam: Obese chronically ill male sitting up in bed, AAOx3 HEENT: Positive JVD CVS: S1-S2, regular rhythm Lungs: Decreased breath sounds to bases Abdomen: Soft, obese, nontender, bowel sounds present Extremities: 1-2+ edema, superficial lower extremity wounds, thick skin, lymphedema changes Psychiatry:  Mood & affect appropriate.     Data Reviewed:   CBC: Recent Labs  Lab 08/25/23 2021 08/26/23 1553 08/27/23 0514 08/28/23 0339  WBC 18.9* 10.6* 7.8 8.3  NEUTROABS 17.3*  --   --   --   HGB 9.2* 8.4* 8.3* 8.2*  HCT 28.5* 26.2* 25.7* 25.4*  MCV 75.4* 75.9* 75.4* 74.7*  PLT 214  197 200 196   Basic Metabolic Panel: Recent Labs  Lab 08/25/23 2021 08/26/23 1553 08/27/23 0514 08/28/23 0339  NA 137 141 138 138  K 3.8 3.4* 3.2* 3.3*  CL 104 106 103 101  CO2 23 25 23 23   GLUCOSE 288* 103* 137* 163*  BUN 64* 59* 65* 67*  CREATININE 3.96* 4.06* 4.37* 4.36*  CALCIUM 8.4* 8.5* 7.9* 8.0*  MG 1.7 1.8  --   --    GFR: Estimated Creatinine Clearance: 23.9 mL/min (A) (by C-G formula based on SCr of 4.36 mg/dL (H)). Liver Function Tests: Recent  Labs  Lab 08/26/23 1553 08/27/23 0514 08/28/23 0339  AST 18 20 20   ALT 14 17 17   ALKPHOS 71 67 65  BILITOT 0.7 0.7 0.4  PROT 7.7 7.1 7.2  ALBUMIN 2.3* 2.1* 2.1*   No results for input(s): "LIPASE", "AMYLASE" in the last 168 hours. No results for input(s): "AMMONIA" in the last 168 hours. Coagulation Profile: No results for input(s): "INR", "PROTIME" in the last 168 hours. Cardiac Enzymes: No results for input(s): "CKTOTAL", "CKMB", "CKMBINDEX", "TROPONINI" in the last 168 hours. BNP (last 3 results) No results for input(s): "PROBNP" in the last 8760 hours. HbA1C: No results for input(s): "HGBA1C" in the last 72 hours. CBG: Recent Labs  Lab 08/27/23 0731 08/27/23 1300 08/27/23 1629 08/27/23 2127 08/28/23 0845  GLUCAP 119* 140* 177* 227* 177*   Lipid Profile: No results for input(s): "CHOL", "HDL", "LDLCALC", "TRIG", "CHOLHDL", "LDLDIRECT" in the last 72 hours. Thyroid Function Tests: No results for input(s): "TSH", "T4TOTAL", "FREET4", "T3FREE", "THYROIDAB" in the last 72 hours. Anemia Panel: Recent Labs    08/26/23 1552  FERRITIN 179  TIBC 265  IRON 19*   Urine analysis:    Component Value Date/Time   COLORURINE YELLOW 08/25/2023 2252   APPEARANCEUR CLEAR 08/25/2023 2252   LABSPEC 1.015 08/25/2023 2252   PHURINE 6.5 08/25/2023 2252   GLUCOSEU 100 (A) 08/25/2023 2252   HGBUR TRACE (A) 08/25/2023 2252   BILIRUBINUR NEGATIVE 08/25/2023 2252   KETONESUR NEGATIVE 08/25/2023 2252   PROTEINUR >=300 (A) 08/25/2023 2252   NITRITE NEGATIVE 08/25/2023 2252   LEUKOCYTESUR NEGATIVE 08/25/2023 2252   Sepsis Labs: @LABRCNTIP (procalcitonin:4,lacticidven:4)  ) Recent Results (from the past 240 hours)  Resp panel by RT-PCR (RSV, Flu A&B, Covid) Anterior Nasal Swab     Status: None   Collection Time: 08/25/23  8:21 PM   Specimen: Anterior Nasal Swab  Result Value Ref Range Status   SARS Coronavirus 2 by RT PCR NEGATIVE NEGATIVE Final    Comment: (NOTE) SARS-CoV-2  target nucleic acids are NOT DETECTED.  The SARS-CoV-2 RNA is generally detectable in upper respiratory specimens during the acute phase of infection. The lowest concentration of SARS-CoV-2 viral copies this assay can detect is 138 copies/mL. A negative result does not preclude SARS-Cov-2 infection and should not be used as the sole basis for treatment or other patient management decisions. A negative result may occur with  improper specimen collection/handling, submission of specimen other than nasopharyngeal swab, presence of viral mutation(s) within the areas targeted by this assay, and inadequate number of viral copies(<138 copies/mL). A negative result must be combined with clinical observations, patient history, and epidemiological information. The expected result is Negative.  Fact Sheet for Patients:  BloggerCourse.com  Fact Sheet for Healthcare Providers:  SeriousBroker.it  This test is no t yet approved or cleared by the [place] FDA and  has been authorized for detection and/or diagnosis of SARS-CoV-2 by FDA  under an Emergency Use Authorization (EUA). This EUA will remain  in effect (meaning this test can be used) for the duration of the COVID-19 declaration under Section 564(b)(1) of the Act, 21 U.S.C.section 360bbb-3(b)(1), unless the authorization is terminated  or revoked sooner.       Influenza A by PCR NEGATIVE NEGATIVE Final   Influenza B by PCR NEGATIVE NEGATIVE Final    Comment: (NOTE) The Xpert Xpress SARS-CoV-2/FLU/RSV plus assay is intended as an aid in the diagnosis of influenza from Nasopharyngeal swab specimens and should not be used as a sole basis for treatment. Nasal washings and aspirates are unacceptable for Xpert Xpress SARS-CoV-2/FLU/RSV testing.  Fact Sheet for Patients: BloggerCourse.com  Fact Sheet for Healthcare  Providers: SeriousBroker.it  This test is not yet approved or cleared by the [place] FDA and has been authorized for detection and/or diagnosis of SARS-CoV-2 by FDA under an Emergency Use Authorization (EUA). This EUA will remain in effect (meaning this test can be used) for the duration of the COVID-19 declaration under Section 564(b)(1) of the Act, 21 U.S.C. section 360bbb-3(b)(1), unless the authorization is terminated or revoked.     Resp Syncytial Virus by PCR NEGATIVE NEGATIVE Final    Comment: (NOTE) Fact Sheet for Patients: BloggerCourse.com  Fact Sheet for Healthcare Providers: SeriousBroker.it  This test is not yet approved or cleared by the [place] FDA and has been authorized for detection and/or diagnosis of SARS-CoV-2 by FDA under an Emergency Use Authorization (EUA). This EUA will remain in effect (meaning this test can be used) for the duration of the COVID-19 declaration under Section 564(b)(1) of the Act, 21 U.S.C. section 360bbb-3(b)(1), unless the authorization is terminated or revoked.  Performed at [facility], [address] [place], [place] [address]      Radiology Studies: US RENAL Result Date: 08/27/2023 CLINICAL DATA:  AKI. EXAM: RENAL / URINARY TRACT ULTRASOUND COMPLETE COMPARISON:  05/05/2023. FINDINGS: Right Kidney: Renal measurements: 10.3 x 5.7 x 5.2 cm = volume: 160.0 mL. Echogenicity within normal limits. No mass or hydronephrosis visualized. Left Kidney: Renal measurements: 10.7 x 6.7 x 5.2 cm = volume: 194.20 mL. Echogenicity within normal limits. No mass or hydronephrosis visualized. Bladder: Appears normal for degree of bladder distention. Other: Examination is limited due to patient's body habitus. IMPRESSION: No obvious abnormality is seen in the kidneys or bladder. Electronically Signed   By: Thornell Sartorius M.D.   On: 08/27/2023 19:55   MR  TIBIA FIBULA RIGHT WO CONTRAST Result Date: 08/27/2023 CLINICAL DATA:  Osteomyelitis suspected, tib/fib, no prior imaging EXAM: MRI OF LOWER RIGHT EXTREMITY WITHOUT CONTRAST TECHNIQUE: Multiplanar, multisequence MR imaging of the right lower leg was performed. No intravenous contrast was administered. COMPARISON:  None Available. FINDINGS: Bones/Joint/Cartilage Right tibia and fibula are intact without fracture. No erosions. No bone marrow edema or periostitis. Small knee joint effusion, nonspecific. No marrow replacing bone lesion. Ligaments Intact. Muscles and Tendons Mild fatty infiltration of the lower leg musculature. No intramuscular edema or fluid collection. Mild thickening of the distal Achilles tendon. No acute tendinous abnormality. No tenosynovitis. Soft tissues Circumferential skin thickening and severe circumferential subcutaneous edema of the lower leg. Near-symmetric changes of the contralateral left lower leg seen on large field-of-view coronal sequences. Skin irregularity of the right lower leg may reflect small superficial wounds. No organized or drainable fluid collections are evident by noncontrast imaging. IMPRESSION: 1. Circumferential skin thickening and severe circumferential subcutaneous edema of the right lower leg. Near-symmetric changes  of the contralateral left lower leg seen on large field-of-view coronal sequences. Findings are nonspecific and may be related to venous insufficiency, lymphedema, low protein states, amongst other etiologies. Superimposed cellulitis not excluded. 2. No evidence of osteomyelitis of the right lower leg. 3. Small knee joint effusion, nonspecific. 4. Mild distal Achilles tendinosis. Electronically Signed   By: Duanne Guess D.O.   On: 08/27/2023 16:25   ECHOCARDIOGRAM COMPLETE Result Date: 08/27/2023    ECHOCARDIOGRAM REPORT   Patient Name:   Peter Terry Date of Exam: 08/27/2023 Medical Rec #:  [id number]      Height:       69.0 in Accession #:     [id number]     Weight:       323.2 lb Date of Birth:  August 31, 1955      BSA:          2.533 m Patient Age:    67 years       BP:           153/77 mmHg Patient Gender: M              HR:           79 bpm. Exam Location:  Inpatient Procedure: 2D Echo, Color Doppler, Cardiac Doppler and Intracardiac            Opacification Agent Indications:    CHF I50.31  History:        Patient has no prior history of Echocardiogram examinations.  Sonographer:    Harriette Bouillon RDCS Referring Phys: [phone] Azazel Franze IMPRESSIONS  1. Left ventricular ejection fraction, by estimation, is 55 to 60%. The left ventricle has normal function. The left ventricle has no regional wall motion abnormalities. Left ventricular diastolic parameters are consistent with Grade I diastolic dysfunction (impaired relaxation). Elevated left ventricular end-diastolic pressure.  2. Right ventricular systolic function is normal. The right ventricular size is normal.  3. The mitral valve is normal in structure. No evidence of mitral valve regurgitation. No evidence of mitral stenosis.  4. The aortic valve was not well visualized. Aortic valve regurgitation is not visualized. No aortic stenosis is present.  5. Aortic dilatation noted. There is mild dilatation of the ascending aorta, measuring 39 mm.  6. The inferior vena cava is normal in size with greater than 50% respiratory variability, suggesting right atrial pressure of 3 mmHg. FINDINGS  Left Ventricle: Left ventricular ejection fraction, by estimation, is 55 to 60%. The left ventricle has normal function. The left ventricle has no regional wall motion abnormalities. The left ventricular internal cavity size was normal in size. There is  no left ventricular hypertrophy. Left ventricular diastolic parameters are consistent with Grade I diastolic dysfunction (impaired relaxation). Elevated left ventricular end-diastolic pressure. Right Ventricle: The right ventricular size is normal. No increase in right  ventricular wall thickness. Right ventricular systolic function is normal. Left [facility]: Left atrial size was normal in size. Right [facility]: Right atrial size was normal in size. Pericardium: There is no evidence of pericardial effusion. Mitral Valve: The mitral valve is normal in structure. No evidence of mitral valve regurgitation. No evidence of mitral valve stenosis. Tricuspid Valve: The tricuspid valve is normal in structure. Tricuspid valve regurgitation is not demonstrated. No evidence of tricuspid stenosis. Aortic Valve: The aortic valve was not well visualized. Aortic valve regurgitation is not visualized. No aortic stenosis is present. Pulmonic Valve: The pulmonic valve was normal in structure. Pulmonic valve regurgitation is not visualized. No evidence  of pulmonic stenosis. Aorta: Aortic dilatation noted. There is mild dilatation of the ascending aorta, measuring 39 mm. Venous: The inferior vena cava is normal in size with greater than 50% respiratory variability, suggesting right atrial pressure of 3 mmHg. IAS/Shunts: The interatrial septum was not well visualized.  LEFT VENTRICLE PLAX 2D LVIDd:         5.60 cm   Diastology LVIDs:         4.20 cm   LV e' medial:    5.22 cm/s LV PW:         1.00 cm   LV E/e' medial:  15.5 LV IVS:        1.00 cm   LV e' lateral:   3.70 cm/s LVOT diam:     2.40 cm   LV E/e' lateral: 21.8 LV SV:         123 LV SV Index:   49 LVOT Area:     4.52 cm  LEFT ATRIUM           Index LA diam:      3.50 cm 1.38 cm/m LA Vol (A2C): 87.4 ml 34.50 ml/m  AORTIC VALVE LVOT Vmax:   127.00 cm/s LVOT Vmean:  87.400 cm/s LVOT VTI:    0.272 m  AORTA Ao Root diam: 3.70 cm Ao Asc diam:  3.90 cm MITRAL VALVE MV Area (PHT): 2.65 cm     SHUNTS MV E velocity: 80.80 cm/s   Systemic VTI:  0.27 m MV A velocity: 105.00 cm/s  Systemic Diam: 2.40 cm MV E/A ratio:  0.77 Charlton Haws MD Electronically signed by Charlton Haws MD Signature Date/Time: 08/27/2023/12:38:20 PM    Final      Scheduled Meds:   atorvastatin  80 mg Oral QHS   carvedilol  25 mg Oral BID   enoxaparin (LOVENOX) injection  40 mg Subcutaneous QHS   ferrous sulfate  325 mg Oral TID WC   hydrALAZINE  100 mg Oral TID   insulin aspart  0-15 Units Subcutaneous TID WC   insulin aspart  0-5 Units Subcutaneous QHS   insulin glargine-yfgn  25 Units Subcutaneous BID   isosorbide mononitrate  60 mg Oral Daily   pantoprazole  40 mg Oral Daily   potassium chloride  40 mEq Oral BID   sodium chloride flush  3 mL Intravenous Q12H   spironolactone  25 mg Oral Daily   Continuous Infusions:  cefTRIAXone (ROCEPHIN)  IV 2 g (08/27/23 1719)   And   metronidazole 500 mg (08/28/23 0311)   furosemide 120 mg (08/28/23 0905)   iron sucrose 200 mg (08/28/23 1100)     LOS: 2 days    Time spent:    Zannie Cove, MD [facility]   08/28/2023, 11:17 AM

## 2023-08-28 NOTE — Plan of Care (Signed)
  Problem: Education: Goal: Ability to describe self-care measures that may prevent or decrease complications (Diabetes Survival Skills Education) will improve Outcome: Progressing Goal: Individualized Educational Video(s) Outcome: Progressing   Problem: Coping: Goal: Ability to adjust to condition or change in health will improve Outcome: Progressing   Problem: Fluid Volume: Goal: Ability to maintain a balanced intake and output will improve Outcome: Progressing   Problem: Health Behavior/Discharge Planning: Goal: Ability to identify and utilize available resources and services will improve Outcome: Progressing Goal: Ability to manage health-related needs will improve Outcome: Progressing   Problem: Metabolic: Goal: Ability to maintain appropriate glucose levels will improve Outcome: Progressing   Problem: Nutritional: Goal: Maintenance of adequate nutrition will improve Outcome: Progressing Goal: Progress toward achieving an optimal weight will improve Outcome: Progressing   Problem: Skin Integrity: Goal: Risk for impaired skin integrity will decrease Outcome: Progressing   Problem: Tissue Perfusion: Goal: Adequacy of tissue perfusion will improve Outcome: Progressing   Problem: Education: Goal: Knowledge of General Education information will improve Description: Including pain rating scale, medication(s)/side effects and non-pharmacologic comfort measures Outcome: Progressing   Problem: Health Behavior/Discharge Planning: Goal: Ability to manage health-related needs will improve Outcome: Progressing   Problem: Clinical Measurements: Goal: Ability to maintain clinical measurements within normal limits will improve Outcome: Progressing Goal: Will remain free from infection Outcome: Progressing Goal: Diagnostic test results will improve Outcome: Progressing Goal: Respiratory complications will improve Outcome: Progressing Goal: Cardiovascular complication will  be avoided Outcome: Progressing   Problem: Activity: Goal: Risk for activity intolerance will decrease Outcome: Progressing   Problem: Nutrition: Goal: Adequate nutrition will be maintained Outcome: Progressing   Problem: Coping: Goal: Level of anxiety will decrease Outcome: Progressing   Problem: Elimination: Goal: Will not experience complications related to bowel motility Outcome: Progressing Goal: Will not experience complications related to urinary retention Outcome: Progressing   Problem: Pain Managment: Goal: General experience of comfort will improve and/or be controlled Outcome: Progressing   Problem: Safety: Goal: Ability to remain free from injury will improve Outcome: Progressing   Problem: Skin Integrity: Goal: Risk for impaired skin integrity will decrease Outcome: Progressing   Problem: Education: Goal: Ability to demonstrate management of disease process will improve Outcome: Progressing Goal: Ability to verbalize understanding of medication therapies will improve Outcome: Progressing Goal: Individualized Educational Video(s) Outcome: Progressing   Problem: Activity: Goal: Capacity to carry out activities will improve Outcome: Progressing   Problem: Cardiac: Goal: Ability to achieve and maintain adequate cardiopulmonary perfusion will improve Outcome: Progressing   Problem: Education: Goal: Ability to demonstrate management of disease process will improve Outcome: Progressing Goal: Ability to verbalize understanding of medication therapies will improve Outcome: Progressing Goal: Individualized Educational Video(s) Outcome: Progressing   Problem: Activity: Goal: Capacity to carry out activities will improve Outcome: Progressing   Problem: Cardiac: Goal: Ability to achieve and maintain adequate cardiopulmonary perfusion will improve Outcome: Progressing

## 2023-08-28 NOTE — Plan of Care (Signed)
Problem: Education: Goal: Ability to describe self-care measures that may prevent or decrease complications (Diabetes Survival Skills Education) will improve 08/28/2023 0623 by Luther Redo, RN Outcome: Progressing 08/28/2023 0616 by Luther Redo, RN Outcome: Progressing Goal: Individualized Educational Video(s) 08/28/2023 0623 by Luther Redo, RN Outcome: Progressing 08/28/2023 0616 by Luther Redo, RN Outcome: Progressing   Problem: Coping: Goal: Ability to adjust to condition or change in health will improve 08/28/2023 0623 by Luther Redo, RN Outcome: Progressing 08/28/2023 0616 by Luther Redo, RN Outcome: Progressing   Problem: Fluid Volume: Goal: Ability to maintain a balanced intake and output will improve 08/28/2023 0623 by Luther Redo, RN Outcome: Progressing 08/28/2023 0616 by Luther Redo, RN Outcome: Progressing   Problem: Health Behavior/Discharge Planning: Goal: Ability to identify and utilize available resources and services will improve 08/28/2023 0623 by Luther Redo, RN Outcome: Progressing 08/28/2023 0616 by Luther Redo, RN Outcome: Progressing Goal: Ability to manage health-related needs will improve 08/28/2023 0623 by Luther Redo, RN Outcome: Progressing 08/28/2023 0616 by Luther Redo, RN Outcome: Progressing   Problem: Metabolic: Goal: Ability to maintain appropriate glucose levels will improve 08/28/2023 0623 by Luther Redo, RN Outcome: Progressing 08/28/2023 0616 by Luther Redo, RN Outcome: Progressing   Problem: Nutritional: Goal: Maintenance of adequate nutrition will improve 08/28/2023 0623 by Luther Redo, RN Outcome: Progressing 08/28/2023 0616 by Luther Redo, RN Outcome: Progressing Goal: Progress toward achieving an optimal weight will improve 08/28/2023 0623 by Luther Redo, RN Outcome: Progressing 08/28/2023 0616 by Luther Redo,  RN Outcome: Progressing   Problem: Skin Integrity: Goal: Risk for impaired skin integrity will decrease 08/28/2023 0623 by Luther Redo, RN Outcome: Progressing 08/28/2023 0616 by Luther Redo, RN Outcome: Progressing   Problem: Tissue Perfusion: Goal: Adequacy of tissue perfusion will improve 08/28/2023 0623 by Luther Redo, RN Outcome: Progressing 08/28/2023 0616 by Luther Redo, RN Outcome: Progressing   Problem: Education: Goal: Knowledge of General Education information will improve Description: Including pain rating scale, medication(s)/side effects and non-pharmacologic comfort measures 08/28/2023 0623 by Luther Redo, RN Outcome: Progressing 08/28/2023 0616 by Luther Redo, RN Outcome: Progressing   Problem: Health Behavior/Discharge Planning: Goal: Ability to manage health-related needs will improve 08/28/2023 0623 by Luther Redo, RN Outcome: Progressing 08/28/2023 0616 by Luther Redo, RN Outcome: Progressing   Problem: Clinical Measurements: Goal: Ability to maintain clinical measurements within normal limits will improve 08/28/2023 0623 by Luther Redo, RN Outcome: Progressing 08/28/2023 0616 by Luther Redo, RN Outcome: Progressing Goal: Will remain free from infection 08/28/2023 0623 by Luther Redo, RN Outcome: Progressing 08/28/2023 0616 by Luther Redo, RN Outcome: Progressing Goal: Diagnostic test results will improve 08/28/2023 0623 by Luther Redo, RN Outcome: Progressing 08/28/2023 0616 by Luther Redo, RN Outcome: Progressing Goal: Respiratory complications will improve 08/28/2023 0623 by Luther Redo, RN Outcome: Progressing 08/28/2023 0616 by Luther Redo, RN Outcome: Progressing Goal: Cardiovascular complication will be avoided 08/28/2023 7253 by Luther Redo, RN Outcome: Progressing 08/28/2023 0616 by Luther Redo, RN Outcome: Progressing    Problem: Activity: Goal: Risk for activity intolerance will decrease 08/28/2023 0623 by Luther Redo, RN Outcome: Progressing 08/28/2023 0616 by Luther Redo, RN Outcome: Progressing   Problem: Nutrition: Goal: Adequate nutrition will be maintained 08/28/2023 0623 by Luther Redo, RN Outcome: Progressing 08/28/2023 0616 by Luther Redo, RN Outcome: Progressing   Problem: Coping: Goal: Level of anxiety will decrease 08/28/2023 0623 by Luther Redo, RN Outcome: Progressing 08/28/2023 0616 by Luther Redo, RN Outcome: Progressing   Problem: Elimination: Goal: Will not experience complications related to bowel motility 08/28/2023 [phone]  by Luther Redo, RN Outcome: Progressing 08/28/2023 0616 by Luther Redo, RN Outcome: Progressing Goal: Will not experience complications related to urinary retention 08/28/2023 0623 by Luther Redo, RN Outcome: Progressing 08/28/2023 0616 by Luther Redo, RN Outcome: Progressing   Problem: Pain Managment: Goal: General experience of comfort will improve and/or be controlled 08/28/2023 0623 by Luther Redo, RN Outcome: Progressing 08/28/2023 0616 by Luther Redo, RN Outcome: Progressing   Problem: Safety: Goal: Ability to remain free from injury will improve 08/28/2023 0623 by Luther Redo, RN Outcome: Progressing 08/28/2023 0616 by Luther Redo, RN Outcome: Progressing   Problem: Skin Integrity: Goal: Risk for impaired skin integrity will decrease 08/28/2023 0623 by Luther Redo, RN Outcome: Progressing 08/28/2023 0616 by Luther Redo, RN Outcome: Progressing   Problem: Education: Goal: Ability to demonstrate management of disease process will improve 08/28/2023 0623 by Luther Redo, RN Outcome: Progressing 08/28/2023 0616 by Luther Redo, RN Outcome: Progressing Goal: Ability to verbalize understanding of medication therapies will  improve 08/28/2023 0623 by Luther Redo, RN Outcome: Progressing 08/28/2023 0616 by Luther Redo, RN Outcome: Progressing Goal: Individualized Educational Video(s) 08/28/2023 0623 by Luther Redo, RN Outcome: Progressing 08/28/2023 0616 by Luther Redo, RN Outcome: Progressing   Problem: Activity: Goal: Capacity to carry out activities will improve 08/28/2023 0623 by Luther Redo, RN Outcome: Progressing 08/28/2023 0616 by Luther Redo, RN Outcome: Progressing   Problem: Cardiac: Goal: Ability to achieve and maintain adequate cardiopulmonary perfusion will improve 08/28/2023 0623 by Luther Redo, RN Outcome: Progressing 08/28/2023 0616 by Luther Redo, RN Outcome: Progressing   Problem: Education: Goal: Ability to demonstrate management of disease process will improve 08/28/2023 0623 by Luther Redo, RN Outcome: Progressing 08/28/2023 0616 by Luther Redo, RN Outcome: Progressing Goal: Ability to verbalize understanding of medication therapies will improve 08/28/2023 0623 by Luther Redo, RN Outcome: Progressing 08/28/2023 0616 by Luther Redo, RN Outcome: Progressing Goal: Individualized Educational Video(s) 08/28/2023 0623 by Luther Redo, RN Outcome: Progressing 08/28/2023 0616 by Luther Redo, RN Outcome: Progressing   Problem: Activity: Goal: Capacity to carry out activities will improve 08/28/2023 0623 by Luther Redo, RN Outcome: Progressing 08/28/2023 0616 by Luther Redo, RN Outcome: Progressing   Problem: Cardiac: Goal: Ability to achieve and maintain adequate cardiopulmonary perfusion will improve 08/28/2023 0623 by Luther Redo, RN Outcome: Progressing 08/28/2023 0616 by Luther Redo, RN Outcome: Progressing

## 2023-08-28 NOTE — Progress Notes (Addendum)
Nephrology Follow-Up Consult note   Assessment/Recommendations: Peter Terry is a/an 68 y.o. male with a past medical history significant for HTN, HLD, COPD, DM 2, GERD, obesity, CKD 4 who present w/ heart Failure exacerbation, CAD by AKI    AKI on CKD 4: Baseline creatinine around 3.5.  Appears massively volume overloaded on exam.  Responding moderately to diuresis.  Creatinine stable -Diuretics as below -hold amlodipine given borderline low BPs -Continue to monitor daily Cr, Dose meds for GFR -Monitor Daily I/Os, Daily weight  -Maintain MAP>65 for optimal renal perfusion.  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  Continue Lasix 120 mg 3 times daily.  Metolazone if needed.  Starting spironolactone today. Consider [id number] tomorrow   Leukocytosis/lower extremity wounds: Wound care involved.  Management per primary team.  On antibiotics   Hypertension: Blood pressure on the lower side.  Continue Hydral/Imdur, carvedilol.  Amlodipine.  Continue to monitor   Hyperlipidemia: On atorvastatin   Uncontrolled Diabetes Mellitus Type 2 with Hyperglycemia: Management per primary    Hypokalemia: replace prn.  Starting spironolactone   Anemia: Likely multifactorial.  Transfuse if needed.  Iron deficient.  Providing IV iron   Recommendations conveyed to primary service.    Darnell Level [facility] 08/28/2023 9:29 AM  ___________________________________________________________  CC: Shortness of breath  Interval History/Subjective: Feels about the same today.  Continues to have some shortness of breath.  Denies chest pain or nausea.  4.1 L of urine made yesterday   Medications:  Current Facility-Administered Medications  Medication Dose Route Frequency Provider Last Rate Last Admin   acetaminophen (TYLENOL) tablet 650  mg  650 mg Oral Q6H PRN Synetta Fail, MD       Or   acetaminophen (TYLENOL) suppository 650 mg  650 mg Rectal Q6H PRN Synetta Fail, MD       albuterol (PROVENTIL) (2.5 MG/3ML) 0.083% nebulizer solution 2.5 mg  2.5 mg Inhalation Q4H PRN Synetta Fail, MD       atorvastatin (LIPITOR) tablet 80 mg  80 mg Oral QHS Synetta Fail, MD   80 mg at 08/27/23 2226   carvedilol (COREG) tablet 25 mg  25 mg Oral BID Synetta Fail, MD   25 mg at 08/28/23 0740   cefTRIAXone (ROCEPHIN) 2 g in sodium chloride 0.9 % 100 mL IVPB  2 g Intravenous Q24H Synetta Fail, MD 200 mL/hr at 08/27/23 1719 2 g at 08/27/23 1719   And   metroNIDAZOLE (FLAGYL) IVPB 500 mg  500 mg Intravenous Q12H Synetta Fail, MD 100 mL/hr at 08/28/23 0311 500 mg at 08/28/23 0311   enoxaparin (LOVENOX) injection 40 mg  40 mg Subcutaneous QHS Silvana Newness, RPH   40 mg at 08/27/23 2225   ferrous sulfate tablet 325 mg  325 mg Oral TID WC Synetta Fail, MD   325 mg at 08/28/23 0905   furosemide (LASIX) 120 mg in dextrose 5 % 50 mL IVPB  120 mg Intravenous TID Darnell Level, MD 62 mL/hr at 08/28/23 0905 120 mg at 08/28/23 0905   hydrALAZINE (APRESOLINE) tablet 100 mg  100 mg Oral TID Synetta Fail, MD   100 mg at 08/28/23 0905   insulin aspart (novoLOG) injection 0-15 Units  0-15 Units Subcutaneous TID WC Synetta Fail, MD   3 Units at 08/28/23 0859   insulin  aspart (novoLOG) injection 0-5 Units  0-5 Units Subcutaneous QHS Synetta Fail, MD       insulin glargine-yfgn [facility]) injection 25 Units  25 Units Subcutaneous BID Zannie Cove, MD   25 Units at 08/28/23 0900   iron sucrose (VENOFER) 200 mg in sodium chloride 0.9 % 100 mL IVPB  200 mg Intravenous Q24H Darnell Level, MD       isosorbide mononitrate (IMDUR) 24 hr tablet 60 mg  60 mg Oral Daily Synetta Fail, MD   60 mg at 08/28/23 0905   pantoprazole (PROTONIX) EC tablet 40 mg  40 mg Oral Daily Synetta Fail, MD   40 mg at 08/28/23 0905   polyethylene glycol (MIRALAX / GLYCOLAX) packet 17 g  17 g Oral Daily PRN Synetta Fail, MD       potassium chloride SA (KLOR-CON M) CR tablet 40 mEq  40 mEq Oral BID Zannie Cove, MD   40 mEq at 08/28/23 0905   sodium chloride flush (NS) 0.9 % injection 3 mL  3 mL Intravenous Q12H Synetta Fail, MD   3 mL at 08/27/23 2227   spironolactone (ALDACTONE) tablet 25 mg  25 mg Oral Daily Darnell Level, MD   25 mg at 08/28/23 0905   traMADol (ULTRAM) tablet 50 mg  50 mg Oral Q8H PRN Synetta Fail, MD   50 mg at 08/28/23 0740      Review of Systems: 10 systems reviewed and negative except per interval history/subjective  Physical Exam: Vitals:   08/28/23 0455 08/28/23 0740  BP: (!) 140/71   Pulse: 73 72  Resp: 18   Temp: 98.6 F (37 C)   SpO2: 95%    Total I/O In: -  Out: 700 [Urine:700]  Intake/Output Summary (Last 24 hours) at 08/28/2023 5643 Last data filed at 08/28/2023 0800 Gross per 24 hour  Intake 884 ml  Output 4200 ml  Net -3316 ml   Constitutional: Obese, lying in bed, no distress ENMT: ears and nose without scars or lesions, MMM CV: normal rate, 2+ pitting edema in the bilateral lower extremities Respiratory: Bilateral chest rise with no increased work of breathing Gastrointestinal: soft, non-tender, moderate distention Skin: wound on right lower extremity, otherwise no visible lesions or rashes Psych: alert, judgement/insight appropriate, appropriate mood and affect   Test Results I personally reviewed new and old clinical labs and radiology tests Lab Results  Component Value Date   NA 138 08/28/2023   K 3.3 (L) 08/28/2023   CL 101 08/28/2023   CO2 23 08/28/2023   BUN 67 (H) 08/28/2023   CREATININE 4.36 (H) 08/28/2023   GFR 13.47 (LL) 05/12/2023   CALCIUM 8.0 (L) 08/28/2023   ALBUMIN 2.1 (L) 08/28/2023    CBC Recent Labs  Lab 08/25/23 2021 08/26/23 1553 08/27/23 0514 08/28/23 0339  WBC 18.9*  10.6* 7.8 8.3  NEUTROABS 17.3*  --   --   --   HGB 9.2* 8.4* 8.3* 8.2*  HCT 28.5* 26.2* 25.7* 25.4*  MCV 75.4* 75.9* 75.4* 74.7*  PLT 214 197 200 196

## 2023-08-28 NOTE — Evaluation (Signed)
Physical Therapy Evaluation Patient Details Name: Peter Terry MRN: [id number] DOB: 12-Feb-1956 Today's Date: 08/28/2023  History of Present Illness  Pt is a 68 yo male presenting to [facility] on 08/25/23 with SOB. Admitted with volume overload, acute on chronic diastolic CHF, R lower extremity cellulitis, and AKI on CKD 4. PMH of CHF, COPD, hypertension, hyperlipidemia, diabetes, CKD 4, GERD, obesity, OSA.   Clinical Impression  Pt presents with condition above and deficits mentioned below, see PT Problem List. PTA, he was independent without DME, working, and living with his parent in a 1-level house with 2 STE. Currently, pt displays some mild deficits in balance and strength, but his primary deficits appears to be his cardiovascular endurance. He is currently able to perform all functional mobility without UE support, LOB, or physical assistance. CGA was provided for safety due to noted mild functional weakness and a mild sway in standing. Educated pt on managing his CHF, see General Comments below. The pt could greatly benefit from follow-up with Cardiac Rehab. Will continue to follow acutely to maximize his return to baseline prior to d/c home.  Of note, pt endorsed being depressed recently, but politely declined any resources or referrals by PT at this time. He did not express any desire to harm himself.         If plan is discharge home, recommend the following: A little help with bathing/dressing/bathroom;Assistance with cooking/housework;Help with stairs or ramp for entrance   Can travel by private vehicle        Equipment Recommendations None recommended by PT (pt declining DME)  Recommendations for Other Services       Functional Status Assessment Patient has had a recent decline in their functional status and demonstrates the ability to make significant improvements in function in a reasonable and predictable amount of time.     Precautions / Restrictions Precautions Precautions:  Fall Restrictions Weight Bearing Restrictions Per Provider Order: No      Mobility  Bed Mobility Overal bed mobility: Needs Assistance Bed Mobility: Supine to Sit     Supine to sit: Supervision, HOB elevated, Used rails     General bed mobility comments: HOB elevated and pt utilized rail to sit up L EOB, supervision for safety, increased time to initiate    Transfers Overall transfer level: Needs assistance Equipment used: None Transfers: Sit to/from Stand Sit to Stand: Contact guard assist           General transfer comment: Pt rocking trunk to gain momentum to stand from low bed surface, CGA for safety, no LOB    Ambulation/Gait Ambulation/Gait assistance: Contact guard assist Gait Distance (Feet): 170 Feet Assistive device: None Gait Pattern/deviations: Step-through pattern, Decreased stride length, Wide base of support Gait velocity: reduced Gait velocity interpretation: <1.8 ft/sec, indicate of risk for recurrent falls   General Gait Details: Pt ambulates with slow, small, wide steps. Mild sway noted, but no LOB. DOE 2/4 with sats >/= 92% on RA. CGA for safety  Stairs            Wheelchair Mobility     Tilt Bed    Modified Rankin (Stroke Patients Only)       Balance Overall balance assessment: Mild deficits observed, not formally tested                                           Pertinent  Vitals/Pain Pain Assessment Pain Assessment: No/denies pain    Home Living Family/patient expects to be discharged to:: Private residence Living Arrangements: Parent Available Help at Discharge: Family Type of Home: House Home Access: Stairs to enter Entrance Stairs-Rails: Doctor, general practice of Steps: 2   Home Layout: One level Home Equipment: None      Prior Function Prior Level of Function : Independent/Modified Independent             Mobility Comments: Pt reports that he uses no AD ADLs Comments: Pt  reports that he is Ind with ADLs/selfcare, IADLs, drives, works full time at night delivering newspapers     Extremity/Trunk Assessment   Upper Extremity Assessment Upper Extremity Assessment: Defer to OT evaluation    Lower Extremity Assessment Lower Extremity Assessment: Generalized weakness;RLE deficits/detail;LLE deficits/detail RLE Deficits / Details: edema noted bil; bandage wrap around leg LLE Deficits / Details: edema noted bil    Cervical / Trunk Assessment Cervical / Trunk Assessment: Other exceptions Cervical / Trunk Exceptions: increased body habitus  Communication   Communication Communication: No apparent difficulties  Cognition Arousal: Alert Behavior During Therapy: Flat affect Overall Cognitive Status: Within Functional Limits for tasks assessed                                 General Comments: reports he has been dealing with depression lately, but declined resources (contacting MD, chaplain services, case manager for counseling referral options etc) offered by PT this session but appreciative of offer        General Comments General comments (skin integrity, edema, etc.): VSS on RA; educated pt on managing his CHF (went through booklet); educated pt on reducing sodium/processed foods intake, weighing self daily, rinsing canned foods, eating fresh/frozen fruits/veggies, walking program and activity progression, he verbalized understanding    Exercises     Assessment/Plan    PT Assessment Patient needs continued PT services  PT Problem List Decreased strength;Decreased activity tolerance;Decreased balance;Decreased mobility;Cardiopulmonary status limiting activity;Decreased skin integrity       PT Treatment Interventions DME instruction;Gait training;Stair training;Functional mobility training;Therapeutic exercise;Balance training;Therapeutic activities;Neuromuscular re-education;Patient/family education    PT Goals (Current goals can be  found in the Care Plan section)  Acute Rehab PT Goals Patient Stated Goal: to improve PT Goal Formulation: With patient Time For Goal Achievement: 09/11/23 Potential to Achieve Goals: Good    Frequency Min 1X/week     Co-evaluation               AM-PAC PT "6 Clicks" Mobility  Outcome Measure Help needed turning from your back to your side while in a flat bed without using bedrails?: None Help needed moving from lying on your back to sitting on the side of a flat bed without using bedrails?: A Little Help needed moving to and from a bed to a chair (including a wheelchair)?: A Little Help needed standing up from a chair using your arms (e.g., wheelchair or bedside chair)?: A Little Help needed to walk in hospital room?: A Little Help needed climbing 3-5 steps with a railing? : A Little 6 Click Score: 19    End of Session Equipment Utilized During Treatment: Gait belt Activity Tolerance: Patient tolerated treatment well Patient left: in chair;with call bell/phone within reach;with chair alarm set   PT Visit Diagnosis: Unsteadiness on feet (R26.81);Other abnormalities of gait and mobility (R26.89);Muscle weakness (generalized) (M62.81)    Time: 4098-1191 PT Time  Calculation (min) (ACUTE ONLY): 26 min   Charges:   PT Evaluation $PT Eval Low Complexity: 1 Low PT Treatments $Therapeutic Activity: 8-22 mins PT General Charges $$ ACUTE PT VISIT: 1 Visit         Virgil Benedict, PT, DPT Acute Rehabilitation Services  Office: [phone]   Bettina Gavia 08/28/2023, 5:49 PM

## 2023-08-29 DIAGNOSIS — I5033 Acute on chronic diastolic (congestive) heart failure: Secondary | ICD-10-CM | POA: Diagnosis not present

## 2023-08-29 LAB — BASIC METABOLIC PANEL
Anion gap: 15 (ref 5–15)
BUN: 68 mg/dL — ABNORMAL HIGH (ref 8–23)
CO2: 22 mmol/L (ref 22–32)
Calcium: 8.4 mg/dL — ABNORMAL LOW (ref 8.9–10.3)
Chloride: 100 mmol/L (ref 98–111)
Creatinine, Ser: 4.57 mg/dL — ABNORMAL HIGH (ref 0.61–1.24)
GFR, Estimated: 13 mL/min — ABNORMAL LOW (ref >60)
Glucose, Bld: 183 mg/dL — ABNORMAL HIGH (ref 70–99)
Potassium: 4 mmol/L (ref 3.5–5.1)
Sodium: 137 mmol/L (ref 135–145)

## 2023-08-29 LAB — CBC
HCT: 27.8 % — ABNORMAL LOW (ref 39.0–52.0)
Hemoglobin: 9.1 g/dL — ABNORMAL LOW (ref 13.0–17.0)
MCH: 24.3 pg — ABNORMAL LOW (ref 26.0–34.0)
MCHC: 32.7 g/dL (ref 30.0–36.0)
MCV: 74.1 fL — ABNORMAL LOW (ref 80.0–100.0)
Platelets: 240 10*3/uL (ref 150–400)
RBC: 3.75 MIL/uL — ABNORMAL LOW (ref 4.22–5.81)
RDW: 15.9 % — ABNORMAL HIGH (ref 11.5–15.5)
WBC: 10 10*3/uL (ref 4.0–10.5)
nRBC: 0 % (ref 0.0–0.2)

## 2023-08-29 LAB — GLUCOSE, CAPILLARY
Glucose-Capillary: 190 mg/dL — ABNORMAL HIGH (ref 70–99)
Glucose-Capillary: 288 mg/dL — ABNORMAL HIGH (ref 70–99)
Glucose-Capillary: 183 mg/dL — ABNORMAL HIGH (ref 70–99)
Glucose-Capillary: 222 mg/dL — ABNORMAL HIGH (ref 70–99)

## 2023-08-29 MED ORDER — ONDANSETRON HCL 4 MG/2ML IJ SOLN
4.0000 mg | Freq: Four times a day (QID) | INTRAMUSCULAR | Status: DC | PRN
  Administered 2023-08-29 – 2023-09-03 (×3): 4 mg via INTRAVENOUS
  Filled 2023-08-29 (×3): qty 2

## 2023-08-29 MED ORDER — EMPAGLIFLOZIN 10 MG PO TABS
10.0000 mg | ORAL_TABLET | Freq: Every day | ORAL | Status: DC
  Administered 2023-08-29 – 2023-08-31 (×3): 10 mg via ORAL
  Filled 2023-08-29 (×3): qty 1

## 2023-08-29 NOTE — Progress Notes (Signed)
Nephrology Follow-Up Consult note   Assessment/Recommendations: Peter Terry is a/an 68 y.o. male with a past medical history significant for HTN, HLD, COPD, DM 2, GERD, obesity, CKD 4 who present w/ heart Failure exacerbation, CAD by AKI    AKI on CKD 4: Baseline creatinine around 3.5.  Appears massively volume overloaded on exam.  Responding moderately to diuresis.  Creatinine has risen slightly as expected with diuresis -Diuretics as below -Continue to monitor daily Cr, Dose meds for GFR -Monitor Daily I/Os, Daily weight  -Maintain MAP>65 for optimal renal perfusion.  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  Continue Lasix 120 mg 3 times daily.  Continue spironolactone.  Start SGLT2 inhibitor today.  Metolazone if needed.     Leukocytosis/lower extremity wounds: Wound care involved.  Management per primary team.  On antibiotics   Hypertension: Blood pressure initially on the lower side.  Held amlodipine.  Continue other medications as prescribed   Hyperlipidemia: On atorvastatin   Uncontrolled Diabetes Mellitus Type 2 with Hyperglycemia: Management per primary    Hypokalemia: Improved.  Continue spironolactone   Anemia: Likely multifactorial.  Transfuse if needed.  Iron deficient.  Providing IV iron   Recommendations conveyed to primary service.    Darnell Level [facility] 08/29/2023 8:30 AM  ___________________________________________________________  CC: Shortness of breath  Interval History/Subjective: Patient feels about the same today.  Continues to have lower extremity pain.  Some shortness of breath.  Feels like its overall improved.  Urine output continues to be good.  Creatinine slightly higher   Medications:  Current Facility-Administered Medications  Medication Dose Route Frequency  Provider Last Rate Last Admin   acetaminophen (TYLENOL) tablet 650 mg  650 mg Oral Q6H PRN Synetta Fail, MD       Or   acetaminophen (TYLENOL) suppository 650 mg  650 mg Rectal Q6H PRN Synetta Fail, MD       albuterol (PROVENTIL) (2.5 MG/3ML) 0.083% nebulizer solution 2.5 mg  2.5 mg Inhalation Q4H PRN Synetta Fail, MD       atorvastatin (LIPITOR) tablet 80 mg  80 mg Oral QHS Synetta Fail, MD   80 mg at 08/28/23 2148   carvedilol (COREG) tablet 25 mg  25 mg Oral BID Synetta Fail, MD   25 mg at 08/28/23 1649   cefTRIAXone (ROCEPHIN) 2 g in sodium chloride 0.9 % 100 mL IVPB  2 g Intravenous Q24H Synetta Fail, MD   Stopping previously hung infusion at 08/29/23 2440   And   metroNIDAZOLE (FLAGYL) IVPB 500 mg  500 mg Intravenous Q12H Synetta Fail, MD   Stopping previously hung infusion at 08/29/23 0650   empagliflozin (JARDIANCE) tablet 10 mg  10 mg Oral Daily Darnell Level, MD       enoxaparin (LOVENOX) injection 40 mg  40 mg Subcutaneous QHS Silvana Newness, RPH   40 mg at 08/28/23 2147   ferrous sulfate tablet 325 mg  325 mg Oral TID WC Synetta Fail, MD   325 mg at 08/28/23 1649   furosemide (LASIX) 120 mg in dextrose 5 % 50 mL IVPB  120 mg Intravenous TID Darnell Level, MD   Stopping previously hung infusion at 08/29/23 0650   hydrALAZINE (APRESOLINE) tablet 75 mg  75 mg Oral TID Zannie Cove, MD   75 mg at 08/28/23 2148  insulin aspart (novoLOG) injection 0-15 Units  0-15 Units Subcutaneous TID WC Synetta Fail, MD   5 Units at 08/28/23 1648   insulin aspart (novoLOG) injection 0-5 Units  0-5 Units Subcutaneous QHS Synetta Fail, MD   2 Units at 08/28/23 2147   insulin glargine-yfgn [facility]) injection 25 Units  25 Units Subcutaneous BID Zannie Cove, MD   25 Units at 08/28/23 2148   iron sucrose (VENOFER) 200 mg in sodium chloride 0.9 % 100 mL IVPB  200 mg Intravenous Q24H Darnell Level, MD 440 mL/hr at 08/28/23  1100 200 mg at 08/28/23 1100   isosorbide mononitrate (IMDUR) 24 hr tablet 30 mg  30 mg Oral Daily Zannie Cove, MD       pantoprazole (PROTONIX) EC tablet 40 mg  40 mg Oral Daily Synetta Fail, MD   40 mg at 08/28/23 0905   polyethylene glycol (MIRALAX / GLYCOLAX) packet 17 g  17 g Oral Daily PRN Synetta Fail, MD       sodium chloride flush (NS) 0.9 % injection 3 mL  3 mL Intravenous Q12H Synetta Fail, MD   3 mL at 08/28/23 1000   spironolactone (ALDACTONE) tablet 25 mg  25 mg Oral Daily Darnell Level, MD   25 mg at 08/28/23 0905   traMADol (ULTRAM) tablet 50 mg  50 mg Oral Q8H PRN Synetta Fail, MD   50 mg at 08/28/23 0740      Review of Systems: 10 systems reviewed and negative except per interval history/subjective  Physical Exam: Vitals:   08/29/23 0416 08/29/23 0813  BP: (!) 156/78 (!) 150/78  Pulse:  77  Resp: 16 20  Temp: 98.2 F (36.8 C) 98.2 F (36.8 C)  SpO2:  94%   Total I/O In: -  Out: 600 [Urine:600]  Intake/Output Summary (Last 24 hours) at 08/29/2023 0830 Last data filed at 08/29/2023 0813 Gross per 24 hour  Intake 874 ml  Output 3350 ml  Net -2476 ml   Constitutional: Obese, sitting, no distress ENMT: ears and nose without scars or lesions, MMM CV: normal rate, 2+ pitting edema in the bilateral lower extremities Respiratory: Bilateral chest rise with no increased work of breathing Gastrointestinal: soft, non-tender, moderate distention Skin: wound on right lower extremity, otherwise no visible lesions or rashes Psych: alert, judgement/insight appropriate, appropriate mood and affect   Test Results I personally reviewed new and old clinical labs and radiology tests Lab Results  Component Value Date   NA 137 08/29/2023   K 4.0 08/29/2023   CL 100 08/29/2023   CO2 22 08/29/2023   BUN 68 (H) 08/29/2023   CREATININE 4.57 (H) 08/29/2023   GFR 13.47 (LL) 05/12/2023   CALCIUM 8.4 (L) 08/29/2023   ALBUMIN 2.1 (L)  08/28/2023    CBC Recent Labs  Lab 08/25/23 2021 08/26/23 1553 08/27/23 0514 08/28/23 0339 08/29/23 0409  WBC 18.9*   < > 7.8 8.3 10.0  NEUTROABS 17.3*  --   --   --   --   HGB 9.2*   < > 8.3* 8.2* 9.1*  HCT 28.5*   < > 25.7* 25.4* 27.8*  MCV 75.4*   < > 75.4* 74.7* 74.1*  PLT 214   < > 200 196 240   < > = values in this interval not displayed.

## 2023-08-29 NOTE — Progress Notes (Addendum)
PROGRESS NOTE    Peter Terry  ZOX:[id number] DOB: [birth year] DOA: 08/25/2023 PCP: [name], MD  67/M w chronic diastolic CHF, COPD, hypertension, hyperlipidemia, diabetes, CKD 4, GERD, obesity, OSA presented to the ED with progressive dyspnea, followed by cardiology and nephrology, recently maintained on Bumex with metolazone every other day -Also has chronic wounds for over a year, worse in the right lower extremity -In the ED tachycardic, BUN 64, creatinine 4, WBC of 18.9, hemoglobin 9.2, troponin 46, 51, chest x-ray with cardiomegaly and pulmonary vascular congestion  Subjective: -Still weak, some shortness of breath, overall improving  Assessment and Plan:  Acute on chronic diastolic CHF Volume overload, severe hypoalbuminemia -Last echo 9/24 at [facility] showed EF 60-65%, normal RV function. -volume overloaded and complicated by AKI/CKD 4 -Improving with diuresis, appreciate nephrology input, 6.9 L negative, weight down 24 LB, creatinine largely stable, 4.5 today -Continue high-dose IV Lasix, Coreg, Imdur, hydralazine, -Jardiance and Aldactone restarted per nephrology -Increase activity, PT OT -Labs in a.m.  Right lower extremity cellulitis, superficial wounds -Chronic wounds worse on right lower extremity for> 1 year -Followed by vascular felt to be venous insufficiency wounds -With mild drainage, erythema warmth and foul smell -Started on ceftriaxone and Flagyl on admission, day 3/7 -ESR and CRP were significantly elevated, fortunately MRI was reassuring without deep infection -Continue wound care  AKI on CKD 4 -Baseline creatinine fluctuating from 2.7-3.3 range, now 4.5 -Cardiorenal, discontinued losartan, avoid hypotension -Renal ultrasound without hydronephrosis  Anemia of chronic disease and iron deficiency -Continue IV iron   COPD - Continue PRN albuterol   Hypertension - Continue carvedilol, hydralazine, Imdur,  - Lasix as above   Hyperlipidemia -  Continue atorvastatin   Diabetes -Decrease insulin dose, continue glargine 25 units    GERD - Continue PPI   Obesity - Noted   OSA - Intolerant to CPAP   DVT prophylaxis:      Lovenox Code Status:              Full Family Communication:       None on admission  Disposition Plan:   Consultants:    Procedures:   Antimicrobials:    Objective: Vitals:   08/29/23 0813 08/29/23 0900 08/29/23 0910 08/29/23 1000  BP: (!) 150/78     Pulse: 77 92 85 77  Resp: 20  19 15   Temp: 98.2 F (36.8 C)     TempSrc: Oral     SpO2: 94% 95% 97% 92%  Weight:      Height:        Intake/Output Summary (Last 24 hours) at 08/29/2023 1056 Last data filed at 08/29/2023 1000 Gross per 24 hour  Intake 1027.43 ml  Output 3000 ml  Net -1972.57 ml   Filed Weights   08/27/23 0326 08/28/23 0455 08/29/23 0416  Weight: (!) 146.6 kg (!) 144.3 kg (!) 142 kg    Examination:  General exam: Obese chronically ill male sitting up in bed, AAOx3 HEENT: Positive JVD CVS: S1-S2, regular rhythm Lungs: Decreased breath sounds at the bases Abdomen: Soft, nontender, bowel sounds present Extremities: 1+ edema ,superficial lower extremity wounds, thick skin, lymphedema changes Psychiatry:  Mood & affect appropriate.     Data Reviewed:   CBC: Recent Labs  Lab 08/25/23 2021 08/26/23 1553 08/27/23 0514 08/28/23 0339 08/29/23 0409  WBC 18.9* 10.6* 7.8 8.3 10.0  NEUTROABS 17.3*  --   --   --   --   HGB 9.2* 8.4* 8.3* 8.2* 9.1*  HCT 28.5* 26.2* 25.7* 25.4* 27.8*  MCV 75.4* 75.9* 75.4* 74.7* 74.1*  PLT 214 197 200 196 240   Basic Metabolic Panel: Recent Labs  Lab 08/25/23 2021 08/26/23 1553 08/27/23 0514 08/28/23 0339 08/29/23 0409  NA 137 141 138 138 137  K 3.8 3.4* 3.2* 3.3* 4.0  CL 104 106 103 101 100  CO2 23 25 23 23 22   GLUCOSE 288* 103* 137* 163* 183*  BUN 64* 59* 65* 67* 68*  CREATININE 3.96* 4.06* 4.37* 4.36* 4.57*  CALCIUM 8.4* 8.5* 7.9* 8.0* 8.4*  MG 1.7 1.8  --   --   --     GFR: Estimated Creatinine Clearance: 22.6 mL/min (A) (by C-G formula based on SCr of 4.57 mg/dL (H)). Liver Function Tests: Recent Labs  Lab 08/26/23 1553 08/27/23 0514 08/28/23 0339  AST 18 20 20   ALT 14 17 17   ALKPHOS 71 67 65  BILITOT 0.7 0.7 0.4  PROT 7.7 7.1 7.2  ALBUMIN 2.3* 2.1* 2.1*   No results for input(s): "LIPASE", "AMYLASE" in the last 168 hours. No results for input(s): "AMMONIA" in the last 168 hours. Coagulation Profile: No results for input(s): "INR", "PROTIME" in the last 168 hours. Cardiac Enzymes: No results for input(s): "CKTOTAL", "CKMB", "CKMBINDEX", "TROPONINI" in the last 168 hours. BNP (last 3 results) No results for input(s): "PROBNP" in the last 8760 hours. HbA1C: No results for input(s): "HGBA1C" in the last 72 hours. CBG: Recent Labs  Lab 08/28/23 0845 08/28/23 1208 08/28/23 1628 08/28/23 2108 08/29/23 0812  GLUCAP 177* 185* 219* 231* 190*   Lipid Profile: No results for input(s): "CHOL", "HDL", "LDLCALC", "TRIG", "CHOLHDL", "LDLDIRECT" in the last 72 hours. Thyroid Function Tests: No results for input(s): "TSH", "T4TOTAL", "FREET4", "T3FREE", "THYROIDAB" in the last 72 hours. Anemia Panel: Recent Labs    08/26/23 1552  FERRITIN 179  TIBC 265  IRON 19*   Urine analysis:    Component Value Date/Time   COLORURINE YELLOW 08/25/2023 2252   APPEARANCEUR CLEAR 08/25/2023 2252   LABSPEC 1.015 08/25/2023 2252   PHURINE 6.5 08/25/2023 2252   GLUCOSEU 100 (A) 08/25/2023 2252   HGBUR TRACE (A) 08/25/2023 2252   BILIRUBINUR NEGATIVE 08/25/2023 2252   KETONESUR NEGATIVE 08/25/2023 2252   PROTEINUR >=300 (A) 08/25/2023 2252   NITRITE NEGATIVE 08/25/2023 2252   LEUKOCYTESUR NEGATIVE 08/25/2023 2252   Sepsis Labs: @LABRCNTIP (procalcitonin:4,lacticidven:4)  ) Recent Results (from the past 240 hours)  Resp panel by RT-PCR (RSV, Flu A&B, Covid) Anterior Nasal Swab     Status: None   Collection Time: 08/25/23  8:21 PM   Specimen:  Anterior Nasal Swab  Result Value Ref Range Status   SARS Coronavirus 2 by RT PCR NEGATIVE NEGATIVE Final    Comment: (NOTE) SARS-CoV-2 target nucleic acids are NOT DETECTED.  The SARS-CoV-2 RNA is generally detectable in upper respiratory specimens during the acute phase of infection. The lowest concentration of SARS-CoV-2 viral copies this assay can detect is 138 copies/mL. A negative result does not preclude SARS-Cov-2 infection and should not be used as the sole basis for treatment or other patient management decisions. A negative result may occur with  improper specimen collection/handling, submission of specimen other than nasopharyngeal swab, presence of viral mutation(s) within the areas targeted by this assay, and inadequate number of viral copies(<138 copies/mL). A negative result must be combined with clinical observations, patient history, and epidemiological information. The expected result is Negative.  Fact Sheet for Patients:  BloggerCourse.com  Fact Sheet for Healthcare Providers:  SeriousBroker.it  This test is no t yet approved or cleared by the [place] and  has been authorized for detection and/or diagnosis of SARS-CoV-2 by FDA under an Emergency Use Authorization (EUA). This EUA will remain  in effect (meaning this test can be used) for the duration of the COVID-19 declaration under Section 564(b)(1) of the Act, 21 U.S.C.section 360bbb-3(b)(1), unless the authorization is terminated  or revoked sooner.       Influenza A by PCR NEGATIVE NEGATIVE Final   Influenza B by PCR NEGATIVE NEGATIVE Final    Comment: (NOTE) The Xpert Xpress SARS-CoV-2/FLU/RSV plus assay is intended as an aid in the diagnosis of influenza from Nasopharyngeal swab specimens and should not be used as a sole basis for treatment. Nasal washings and aspirates are unacceptable for Xpert Xpress SARS-CoV-2/FLU/RSV testing.  Fact  Sheet for Patients: BloggerCourse.com  Fact Sheet for Healthcare Providers: SeriousBroker.it  This test is not yet approved or cleared by the [place] FDA and has been authorized for detection and/or diagnosis of SARS-CoV-2 by FDA under an Emergency Use Authorization (EUA). This EUA will remain in effect (meaning this test can be used) for the duration of the COVID-19 declaration under Section 564(b)(1) of the Act, 21 U.S.C. section 360bbb-3(b)(1), unless the authorization is terminated or revoked.     Resp Syncytial Virus by PCR NEGATIVE NEGATIVE Final    Comment: (NOTE) Fact Sheet for Patients: BloggerCourse.com  Fact Sheet for Healthcare Providers: SeriousBroker.it  This test is not yet approved or cleared by the [place] FDA and has been authorized for detection and/or diagnosis of SARS-CoV-2 by FDA under an Emergency Use Authorization (EUA). This EUA will remain in effect (meaning this test can be used) for the duration of the COVID-19 declaration under Section 564(b)(1) of the Act, 21 U.S.C. section 360bbb-3(b)(1), unless the authorization is terminated or revoked.  Performed at [facility], [address], [place], [place] [address]      Radiology Studies: US RENAL Result Date: 08/27/2023 CLINICAL DATA:  AKI. EXAM: RENAL / URINARY TRACT ULTRASOUND COMPLETE COMPARISON:  05/05/2023. FINDINGS: Right Kidney: Renal measurements: 10.3 x 5.7 x 5.2 cm = volume: 160.0 mL. Echogenicity within normal limits. No mass or hydronephrosis visualized. Left Kidney: Renal measurements: 10.7 x 6.7 x 5.2 cm = volume: 194.20 mL. Echogenicity within normal limits. No mass or hydronephrosis visualized. Bladder: Appears normal for degree of bladder distention. Other: Examination is limited due to patient's body habitus. IMPRESSION: No obvious abnormality is seen in the kidneys  or bladder. Electronically Signed   By: Thornell Sartorius M.D.   On: 08/27/2023 19:55   MR TIBIA FIBULA RIGHT WO CONTRAST Result Date: 08/27/2023 CLINICAL DATA:  Osteomyelitis suspected, tib/fib, no prior imaging EXAM: MRI OF LOWER RIGHT EXTREMITY WITHOUT CONTRAST TECHNIQUE: Multiplanar, multisequence MR imaging of the right lower leg was performed. No intravenous contrast was administered. COMPARISON:  None Available. FINDINGS: Bones/Joint/Cartilage Right tibia and fibula are intact without fracture. No erosions. No bone marrow edema or periostitis. Small knee joint effusion, nonspecific. No marrow replacing bone lesion. Ligaments Intact. Muscles and Tendons Mild fatty infiltration of the lower leg musculature. No intramuscular edema or fluid collection. Mild thickening of the distal Achilles tendon. No acute tendinous abnormality. No tenosynovitis. Soft tissues Circumferential skin thickening and severe circumferential subcutaneous edema of the lower leg. Near-symmetric changes of the contralateral left lower leg seen on large field-of-view coronal sequences. Skin irregularity of the right lower leg may reflect small superficial  wounds. No organized or drainable fluid collections are evident by noncontrast imaging. IMPRESSION: 1. Circumferential skin thickening and severe circumferential subcutaneous edema of the right lower leg. Near-symmetric changes of the contralateral left lower leg seen on large field-of-view coronal sequences. Findings are nonspecific and may be related to venous insufficiency, lymphedema, low protein states, amongst other etiologies. Superimposed cellulitis not excluded. 2. No evidence of osteomyelitis of the right lower leg. 3. Small knee joint effusion, nonspecific. 4. Mild distal Achilles tendinosis. Electronically Signed   By: Duanne Guess D.O.   On: 08/27/2023 16:25   ECHOCARDIOGRAM COMPLETE Result Date: 08/27/2023    ECHOCARDIOGRAM REPORT   Patient Name:   Peter Terry Date  of Exam: 08/27/2023 Medical Rec #:  [id number]      Height:       69.0 in Accession #:    [id number]     Weight:       323.2 lb Date of Birth:  1955/12/13      BSA:          2.533 m Patient Age:    67 years       BP:           153/77 mmHg Patient Gender: M              HR:           79 bpm. Exam Location:  Inpatient Procedure: 2D Echo, Color Doppler, Cardiac Doppler and Intracardiac            Opacification Agent Indications:    CHF I50.31  History:        Patient has no prior history of Echocardiogram examinations.  Sonographer:    Harriette Bouillon RDCS Referring Phys: [phone] [place] IMPRESSIONS  1. Left ventricular ejection fraction, by estimation, is 55 to 60%. The left ventricle has normal function. The left ventricle has no regional wall motion abnormalities. Left ventricular diastolic parameters are consistent with Grade I diastolic dysfunction (impaired relaxation). Elevated left ventricular end-diastolic pressure.  2. Right ventricular systolic function is normal. The right ventricular size is normal.  3. The mitral valve is normal in structure. No evidence of mitral valve regurgitation. No evidence of mitral stenosis.  4. The aortic valve was not well visualized. Aortic valve regurgitation is not visualized. No aortic stenosis is present.  5. Aortic dilatation noted. There is mild dilatation of the ascending aorta, measuring 39 mm.  6. The inferior vena cava is normal in size with greater than 50% respiratory variability, suggesting right atrial pressure of 3 mmHg. FINDINGS  Left Ventricle: Left ventricular ejection fraction, by estimation, is 55 to 60%. The left ventricle has normal function. The left ventricle has no regional wall motion abnormalities. The left ventricular internal cavity size was normal in size. There is  no left ventricular hypertrophy. Left ventricular diastolic parameters are consistent with Grade I diastolic dysfunction (impaired relaxation). Elevated left ventricular end-diastolic  pressure. Right Ventricle: The right ventricular size is normal. No increase in right ventricular wall thickness. Right ventricular systolic function is normal. Left [facility]: Left atrial size was normal in size. Right [facility]: Right atrial size was normal in size. Pericardium: There is no evidence of pericardial effusion. Mitral Valve: The mitral valve is normal in structure. No evidence of mitral valve regurgitation. No evidence of mitral valve stenosis. Tricuspid Valve: The tricuspid valve is normal in structure. Tricuspid valve regurgitation is not demonstrated. No evidence of tricuspid stenosis. Aortic Valve: The aortic valve was not well  visualized. Aortic valve regurgitation is not visualized. No aortic stenosis is present. Pulmonic Valve: The pulmonic valve was normal in structure. Pulmonic valve regurgitation is not visualized. No evidence of pulmonic stenosis. Aorta: Aortic dilatation noted. There is mild dilatation of the ascending aorta, measuring 39 mm. Venous: The inferior vena cava is normal in size with greater than 50% respiratory variability, suggesting right atrial pressure of 3 mmHg. IAS/Shunts: The interatrial septum was not well visualized.  LEFT VENTRICLE PLAX 2D LVIDd:         5.60 cm   Diastology LVIDs:         4.20 cm   LV e' medial:    5.22 cm/s LV PW:         1.00 cm   LV E/e' medial:  15.5 LV IVS:        1.00 cm   LV e' lateral:   3.70 cm/s LVOT diam:     2.40 cm   LV E/e' lateral: 21.8 LV SV:         123 LV SV Index:   49 LVOT Area:     4.52 cm  LEFT ATRIUM           Index LA diam:      3.50 cm 1.38 cm/m LA Vol (A2C): 87.4 ml 34.50 ml/m  AORTIC VALVE LVOT Vmax:   127.00 cm/s LVOT Vmean:  87.400 cm/s LVOT VTI:    0.272 m  AORTA Ao Root diam: 3.70 cm Ao Asc diam:  3.90 cm MITRAL VALVE MV Area (PHT): 2.65 cm     SHUNTS MV E velocity: 80.80 cm/s   Systemic VTI:  0.27 m MV A velocity: 105.00 cm/s  Systemic Diam: 2.40 cm MV E/A ratio:  0.77 Charlton Haws MD Electronically signed by Charlton Haws MD Signature Date/Time: 08/27/2023/12:38:20 PM    Final      Scheduled Meds:  atorvastatin  80 mg Oral QHS   carvedilol  25 mg Oral BID   empagliflozin  10 mg Oral Daily   enoxaparin (LOVENOX) injection  40 mg Subcutaneous QHS   ferrous sulfate  325 mg Oral TID WC   hydrALAZINE  75 mg Oral TID   insulin aspart  0-15 Units Subcutaneous TID WC   insulin aspart  0-5 Units Subcutaneous QHS   insulin glargine-yfgn  25 Units Subcutaneous BID   isosorbide mononitrate  30 mg Oral Daily   pantoprazole  40 mg Oral Daily   sodium chloride flush  3 mL Intravenous Q12H   spironolactone  25 mg Oral Daily   Continuous Infusions:  cefTRIAXone (ROCEPHIN)  IV Stopped (08/29/23 4098)   And   metronidazole 100 mL/hr at 08/29/23 1000   furosemide Stopped (08/29/23 0650)   iron sucrose Stopped (08/29/23 0931)     LOS: 3 days    Time spent:    Zannie Cove, MD [facility]   08/29/2023, 10:56 AM

## 2023-08-29 NOTE — Plan of Care (Signed)
  Problem: Education: Goal: Ability to describe self-care measures that may prevent or decrease complications (Diabetes Survival Skills Education) will improve Outcome: Progressing   Problem: Coping: Goal: Ability to adjust to condition or change in health will improve Outcome: Progressing   Problem: Fluid Volume: Goal: Ability to maintain a balanced intake and output will improve Outcome: Progressing   Problem: Health Behavior/Discharge Planning: Goal: Ability to identify and utilize available resources and services will improve Outcome: Progressing Goal: Ability to manage health-related needs will improve Outcome: Progressing   Problem: Metabolic: Goal: Ability to maintain appropriate glucose levels will improve Outcome: Progressing   Problem: Nutritional: Goal: Maintenance of adequate nutrition will improve Outcome: Progressing   Problem: Skin Integrity: Goal: Risk for impaired skin integrity will decrease Outcome: Progressing

## 2023-08-30 DIAGNOSIS — I5033 Acute on chronic diastolic (congestive) heart failure: Secondary | ICD-10-CM | POA: Diagnosis not present

## 2023-08-30 LAB — BASIC METABOLIC PANEL
Anion gap: 15 (ref 5–15)
BUN: 74 mg/dL — ABNORMAL HIGH (ref 8–23)
CO2: 23 mmol/L (ref 22–32)
Calcium: 8.3 mg/dL — ABNORMAL LOW (ref 8.9–10.3)
Chloride: 99 mmol/L (ref 98–111)
Creatinine, Ser: 4.9 mg/dL — ABNORMAL HIGH (ref 0.61–1.24)
GFR, Estimated: 12 mL/min — ABNORMAL LOW (ref >60)
Glucose, Bld: 187 mg/dL — ABNORMAL HIGH (ref 70–99)
Potassium: 3.9 mmol/L (ref 3.5–5.1)
Sodium: 137 mmol/L (ref 135–145)

## 2023-08-30 LAB — GLUCOSE, CAPILLARY
Glucose-Capillary: 221 mg/dL — ABNORMAL HIGH (ref 70–99)
Glucose-Capillary: 212 mg/dL — ABNORMAL HIGH (ref 70–99)
Glucose-Capillary: 194 mg/dL — ABNORMAL HIGH (ref 70–99)
Glucose-Capillary: 213 mg/dL — ABNORMAL HIGH (ref 70–99)

## 2023-08-30 MED ORDER — INSULIN GLARGINE-YFGN 100 UNIT/ML ~~LOC~~ SOLN
30.0000 [IU] | Freq: Two times a day (BID) | SUBCUTANEOUS | Status: DC
  Administered 2023-08-30 – 2023-09-03 (×8): 30 [IU] via SUBCUTANEOUS
  Filled 2023-08-30 (×9): qty 0.3

## 2023-08-30 MED ORDER — ENOXAPARIN SODIUM 30 MG/0.3ML IJ SOSY
30.0000 mg | PREFILLED_SYRINGE | Freq: Every day | INTRAMUSCULAR | Status: DC
  Administered 2023-08-30 – 2023-09-02 (×4): 30 mg via SUBCUTANEOUS
  Filled 2023-08-30 (×4): qty 0.3

## 2023-08-30 NOTE — TOC Initial Note (Signed)
Transition of Care [facility]) - Initial/Assessment Note    Patient Details  Name: Peter Terry MRN: [id number] Date of Birth: [birth year]  [facility]) CM/SW Contact:    Gala Lewandowsky, RN Phone Number: 08/30/2023, 3:32 PM  Clinical Narrative:  Patient presented for shortness of breath. PTA patient states he was from home with family support. Patient states he has cane and rolling walker at home. Case Manager discussed home health services and the patient feels that he will not need services at this time. Case Manager will continue to follow for additional transition of care needs as the patient progresses.                Expected Discharge Plan: Home w Home Health Services Barriers to Discharge: Continued Medical Work up   Patient Goals and CMS Choice Patient states their goals for this hospitalization and ongoing recovery are:: patient to return home with family support.  Expected Discharge Plan and Services   Discharge Planning Services: CM Consult (cane, rolling walker)   Living arrangements for the past 2 months: Single Family Home  Prior Living Arrangements/Services Living arrangements for the past 2 months: Single Family Home Lives with::  (Patient states he has support in the home.) Patient language and need for interpreter reviewed:: Yes        Need for Family Participation in Patient Care: Yes (Comment) Care giver support system in place?: Yes (comment)   Criminal Activity/Legal Involvement Pertinent to Current Situation/Hospitalization: No - Comment as needed  Activities of Daily Living   ADL Screening (condition at time of admission) Independently performs ADLs?: No Does the patient have a NEW difficulty with bathing/dressing/toileting/self-feeding that is expected to last >3 days?: Yes (Initiates electronic notice to provider for possible OT consult) Does the patient have a NEW difficulty with getting in/out of bed, walking, or climbing stairs  that is expected to last >3 days?: Yes (Initiates electronic notice to provider for possible PT consult) Does the patient have a NEW difficulty with communication that is expected to last >3 days?: No Is the patient deaf or have difficulty hearing?: No Does the patient have difficulty seeing, even when wearing glasses/contacts?: No Does the patient have difficulty concentrating, remembering, or making decisions?: No  Permission Sought/Granted Permission sought to share information with : Case Manager, Family Supports                Emotional Assessment Appearance:: Appears stated age Attitude/Demeanor/Rapport: Engaged Affect (typically observed): Appropriate Orientation: : Oriented to Self, Oriented to Place, Oriented to  Time Alcohol / Substance Use: Not Applicable Psych Involvement: No (comment)  Admission diagnosis:  Acute exacerbation of CHF (congestive heart failure) (HCC) [I50.9] Acute on chronic congestive heart failure, unspecified heart failure type [facility]) [I50.9] Patient Active Problem List   Diagnosis Date Noted   Acute renal failure superimposed on stage 4 chronic kidney disease (HCC) 08/26/2023   Acute exacerbation of CHF (congestive heart failure) (HCC) 08/25/2023   Chronic right hip pain 07/13/2023   Venous stasis ulcer of left calf limited to breakdown of skin without varicose veins (HCC) 07/13/2023   Dyspnea on exertion 05/02/2023   Chronic kidney disease, stage 4 (severe) (HCC) 03/31/2023   Obstructive sleep apnea 03/31/2023   GERD (gastroesophageal reflux disease) 03/31/2023   Edema of both lower legs 03/31/2023   Venous stasis dermatitis 03/31/2023   Tinea pedis 03/31/2023   Heart Failure with Preserved EF (HCC) 03/31/2023   Diabetic peripheral neuropathy (HCC) 03/31/2023  Mixed hyperlipidemia 03/30/2023   Essential hypertension 03/30/2023   COPD (chronic obstructive pulmonary disease) (HCC) 03/30/2023   Type 2 diabetes mellitus with stage 4 chronic  kidney disease, with long-term current use of insulin (HCC) 11/04/2022   Hypomagnesemia 11/04/2022   Iron deficiency anemia 03/31/2022   Insulin long-term use (HCC) 08/21/2020   Morbid obesity with body mass index (BMI) of 50.0 to 59.9 in adult [facility]) 07/25/2020   Retinal tear 02/20/2013   PCP:  [name], MD Pharmacy:   [facility] [place], [place] - [address], [address], [address] [place] [address] Phone: [phone] Fax: [phone]  [id number] - [place], [place] - [address] [place] [address] [place] [address] Phone: [phone] Fax: [phone]  Social Drivers of Health (SDOH) Social History: SDOH Screenings   Food Insecurity: No Food Insecurity (08/26/2023)  Housing: Low Risk  (08/26/2023)  Transportation Needs: No Transportation Needs (08/26/2023)  Utilities: Not At Risk (08/26/2023)  Depression (PHQ2-9): High Risk (03/31/2023)  Financial Resource Strain: Low Risk  (05/26/2023)  Physical Activity: Insufficiently Active (05/26/2023)  Social Connections: Socially Isolated (08/26/2023)  Stress: Stress Concern Present (05/26/2023)  Tobacco Use: Medium Risk (08/25/2023)   Readmission Risk Interventions     No data to display

## 2023-08-30 NOTE — Progress Notes (Signed)
Nephrology Follow-Up Consult note   Assessment/Recommendations: Peter Terry is a  68 y.o. male with a past medical history significant for HTN, HLD, COPD, DM 2, GERD, obesity, CKD 4 who present w/ heart Failure exacerbation, CAD by AKI    AKI on CKD 4: Baseline creatinine around 3.5.  Appears massively volume overloaded on exam.  Responding moderately to diuresis.  Creatinine has risen slightly as expected with diuresis but he still has evidence of volume on board. Likely will need to back off or hold tomorrow if renal function continues to worsen.  -Diuretics as below -Continue to monitor daily Cr, Dose meds for GFR -Monitor Daily I/Os, Daily weight  -Maintain MAP>65 for optimal renal perfusion.  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  Continue Lasix 120 mg 3 times daily.  Continue spironolactone.  Started SGLT2 inhibitor.  Metolazone if needed but still good UOP and renal function worsening so would not start for now.      Leukocytosis/lower extremity wounds: Wound care involved.  Management per primary team.  On antibiotics   Hypertension: Blood pressure initially on the lower side.  Held amlodipine.  Continue other medications as prescribed   Hyperlipidemia: On atorvastatin   Uncontrolled Diabetes Mellitus Type 2 with Hyperglycemia: Management per primary    Hypokalemia: Improved.  Continue spironolactone   Anemia: Likely multifactorial.  Transfuse if needed.  Iron deficient.  Providing IV iron    Ethelene Hal [facility] 08/30/2023 10:25 AM  ___________________________________________________________  CC: Shortness of breath  Interval History/Subjective: Patient feels about the same today.  Continues to have lower extremity pain.  Shortness of breath but somewhat improved. Urine output continues to be  good.  Creatinine slightly higher   Medications:  Current Facility-Administered Medications  Medication Dose Route Frequency Provider Last Rate Last Admin   acetaminophen (TYLENOL) tablet 650 mg  650 mg Oral Q6H PRN Synetta Fail, MD       Or   acetaminophen (TYLENOL) suppository 650 mg  650 mg Rectal Q6H PRN Synetta Fail, MD       albuterol (PROVENTIL) (2.5 MG/3ML) 0.083% nebulizer solution 2.5 mg  2.5 mg Inhalation Q4H PRN Synetta Fail, MD       atorvastatin (LIPITOR) tablet 80 mg  80 mg Oral QHS Synetta Fail, MD   80 mg at 08/29/23 2200   carvedilol (COREG) tablet 25 mg  25 mg Oral BID Synetta Fail, MD   25 mg at 08/30/23 8295   cefTRIAXone (ROCEPHIN) 2 g in sodium chloride 0.9 % 100 mL IVPB  2 g Intravenous Q24H Synetta Fail, MD 200 mL/hr at 08/29/23 2324 2 g at 08/29/23 2324   And   metroNIDAZOLE (FLAGYL) IVPB 500 mg  500 mg Intravenous Q12H Synetta Fail, MD 100 mL/hr at 08/29/23 2206 500 mg at 08/29/23 2206   empagliflozin (JARDIANCE) tablet 10 mg  10 mg Oral Daily Darnell Level, MD   10 mg at 08/30/23 0836   enoxaparin (LOVENOX) injection 40 mg  40 mg Subcutaneous QHS Silvana Newness, RPH   40 mg at 08/29/23 2200   ferrous sulfate tablet 325 mg  325 mg Oral TID WC Synetta Fail, MD   325 mg at 08/30/23 0835   furosemide (LASIX) 120 mg in dextrose 5 % 50 mL IVPB  120 mg Intravenous TID Darnell Level,  MD 62 mL/hr at 08/30/23 0921 120 mg at 08/30/23 6644   hydrALAZINE (APRESOLINE) tablet 75 mg  75 mg Oral TID Zannie Cove, MD   75 mg at 08/30/23 0834   insulin aspart (novoLOG) injection 0-15 Units  0-15 Units Subcutaneous TID WC Synetta Fail, MD   5 Units at 08/30/23 0347   insulin aspart (novoLOG) injection 0-5 Units  0-5 Units Subcutaneous QHS Synetta Fail, MD   2 Units at 08/29/23 2202   insulin glargine-yfgn [facility]) injection 25 Units  25 Units Subcutaneous BID Zannie Cove, MD   25 Units at 08/30/23  [phone]   iron sucrose (VENOFER) 200 mg in sodium chloride 0.9 % 100 mL IVPB  200 mg Intravenous Q24H Darnell Level, MD   Stopped at 08/29/23 0931   isosorbide mononitrate (IMDUR) 24 hr tablet 30 mg  30 mg Oral Daily Zannie Cove, MD   30 mg at 08/30/23 0835   ondansetron [facility]) injection 4 mg  4 mg Intravenous Q6H PRN Zannie Cove, MD   4 mg at 08/29/23 1134   pantoprazole (PROTONIX) EC tablet 40 mg  40 mg Oral Daily Synetta Fail, MD   40 mg at 08/30/23 0835   polyethylene glycol (MIRALAX / GLYCOLAX) packet 17 g  17 g Oral Daily PRN Synetta Fail, MD       sodium chloride flush (NS) 0.9 % injection 3 mL  3 mL Intravenous Q12H Synetta Fail, MD   3 mL at 08/30/23 5638   spironolactone (ALDACTONE) tablet 25 mg  25 mg Oral Daily Darnell Level, MD   25 mg at 08/30/23 0834   traMADol (ULTRAM) tablet 50 mg  50 mg Oral Q8H PRN Synetta Fail, MD   50 mg at 08/29/23 0930      Review of Systems: 10 systems reviewed and negative except per interval history/subjective  Physical Exam: Vitals:   08/30/23 0737 08/30/23 0834  BP: 125/65 125/65  Pulse: 80   Resp: 16   Temp: 98.3 F (36.8 C)   SpO2: 100%    Total I/O In: 360 [P.O.:360] Out: 800 [Urine:800]  Intake/Output Summary (Last 24 hours) at 08/30/2023 1025 Last data filed at 08/30/2023 0932 Gross per 24 hour  Intake 1042.51 ml  Output 2775 ml  Net -1732.49 ml   Constitutional: Obese, sitting, no distress ENMT: ears and nose without scars or lesions, MMM CV: normal rate, 2+ pitting edema in the bilateral lower extremities Respiratory: Bilateral chest rise with no increased work of breathing Gastrointestinal: soft, non-tender, moderate distention Skin: wound on right lower extremity, otherwise no visible lesions or rashes Psych: alert, judgement/insight appropriate, appropriate mood and affect   Test Results I personally reviewed new and old clinical labs and radiology tests Lab Results   Component Value Date   NA 137 08/30/2023   K 3.9 08/30/2023   CL 99 08/30/2023   CO2 23 08/30/2023   BUN 74 (H) 08/30/2023   CREATININE 4.90 (H) 08/30/2023   GFR 13.47 (LL) 05/12/2023   CALCIUM 8.3 (L) 08/30/2023   ALBUMIN 2.1 (L) 08/28/2023    CBC Recent Labs  Lab 08/25/23 2021 08/26/23 1553 08/27/23 0514 08/28/23 0339 08/29/23 0409  WBC 18.9*   < > 7.8 8.3 10.0  NEUTROABS 17.3*  --   --   --   --   HGB 9.2*   < > 8.3* 8.2* 9.1*  HCT 28.5*   < > 25.7* 25.4* 27.8*  MCV 75.4*   < >  75.4* 74.7* 74.1*  PLT 214   < > 200 196 240   < > = values in this interval not displayed.

## 2023-08-30 NOTE — Progress Notes (Signed)
Mobility Specialist Progress Note;   08/30/23 1100  Mobility  Activity Transferred from bed to chair  Level of Assistance Contact guard assist, steadying assist  Assistive Device Front wheel walker  Distance Ambulated (ft) 3 ft  Activity Response Tolerated well  Mobility Referral Yes  Mobility visit 1 Mobility  Mobility Specialist Start Time (ACUTE ONLY) 1100  Mobility Specialist Stop Time (ACUTE ONLY) 1110  Mobility Specialist Time Calculation (min) (ACUTE ONLY) 10 min   Pt requesting assistance to chair. Required MinG assistance throughout for safety. VSS throughout and n/o. Pt left in chair with all needs met, BLE elevated. RN in room.   Caesar Bookman Mobility Specialist Please contact via SecureChat or [employer] [phone]

## 2023-08-30 NOTE — Plan of Care (Signed)
  Problem: Health Behavior/Discharge Planning: Goal: Ability to manage health-related needs will improve Outcome: Progressing   Problem: Education: Goal: Knowledge of General Education information will improve Description: Including pain rating scale, medication(s)/side effects and non-pharmacologic comfort measures Outcome: Progressing   Problem: Clinical Measurements: Goal: Respiratory complications will improve Outcome: Progressing   Problem: Activity: Goal: Risk for activity intolerance will decrease Outcome: Progressing   Problem: Coping: Goal: Level of anxiety will decrease Outcome: Progressing   Problem: Elimination: Goal: Will not experience complications related to urinary retention Outcome: Progressing   Problem: Pain Managment: Goal: General experience of comfort will improve and/or be controlled Outcome: Progressing   Problem: Metabolic: Goal: Ability to maintain appropriate glucose levels will improve Outcome: Not Progressing   Problem: Skin Integrity: Goal: Risk for impaired skin integrity will decrease Outcome: Not Progressing   Problem: Clinical Measurements: Goal: Will remain free from infection Outcome: Not Progressing

## 2023-08-30 NOTE — Progress Notes (Addendum)
Occupational Therapy Treatment Patient Details Name: Peter Terry MRN: [id number] DOB: [birth year] Today's Date: 08/30/2023   History of present illness Pt is a 68 yo male presenting to [facility] on 08/25/23 with SOB. Admitted with volume overload, acute on chronic diastolic CHF, R lower extremity cellulitis, and AKI on CKD 4. PMH of CHF, COPD, hypertension, hyperlipidemia, diabetes, CKD 4, GERD, obesity, OSA.   OT comments  Pt making progress with functional goals. Pt up in recliner upon OT arrival and stated that he is feeling "a little better". Pt with DOE with minimal activity. Pt educated on energy conservation strategies with handouts provided, pt verbalizes understanding. Pt states that he is "still thinking about it" for receiving [facility] therapy services after acute stay d/c. O2 SATs >88% and HR stable throughout activity. OT will continue to follow acutely to maximize level of function and safety      If plan is discharge home, recommend the following:  A lot of help with bathing/dressing/bathroom;A little help with walking and/or transfers;Assistance with cooking/housework;Help with stairs or ramp for entrance;Assist for transportation   Equipment Recommendations  Tub/shower bench    Recommendations for Other Services      Precautions / Restrictions Precautions Precautions: Fall Precaution Comments: watch O2 SATs Restrictions Weight Bearing Restrictions Per Provider Order: No       Mobility Bed Mobility               General bed mobility comments: pt in chair upon arrival    Transfers Overall transfer level: Needs assistance Equipment used: None, Rolling walker (2 wheels) Transfers: Sit to/from Stand Sit to Stand: Contact guard assist           General transfer comment: CGA for safety, no LOB     Balance Overall balance assessment: Mild deficits observed, not formally tested                                         ADL either performed or  assessed with clinical judgement   ADL Overall ADL's : Needs assistance/impaired     Grooming: Wash/dry hands;Wash/dry face;Contact guard assist;Standing           Upper Body Dressing : Set up;Sitting       Toilet Transfer: Contact guard assist;Ambulation;Rolling walker (2 wheels);Stand-pivot   Toileting- Architect and Hygiene: Contact guard assist;Sit to/from stand       Functional mobility during ADLs: Contact guard assist;Rolling walker (2 wheels) General ADL Comments: pt educated on energy conservation strategies with handouts provided, pt verbalizes understanding    Extremity/Trunk Assessment Upper Extremity Assessment Upper Extremity Assessment: Overall WFL for tasks assessed   Lower Extremity Assessment Lower Extremity Assessment: Defer to PT evaluation   Cervical / Trunk Assessment Cervical / Trunk Assessment: Other exceptions Cervical / Trunk Exceptions: increased body habitus    Vision Baseline Vision/History: 1 Wears glasses Ability to See in Adequate Light: 0 Adequate Patient Visual Report: No change from baseline     Perception     Praxis      Cognition Arousal: Alert Behavior During Therapy: WFL for tasks assessed/performed Overall Cognitive Status: Within Functional Limits for tasks assessed                                          Exercises  Shoulder Instructions       General Comments      Pertinent Vitals/ Pain       Pain Assessment Pain Assessment: No/denies pain  Home Living                                          Prior Functioning/Environment              Frequency  Min 2X/week        Progress Toward Goals  OT Goals(current goals can now be found in the care plan section)  Progress towards OT goals: Progressing toward goals  ADL Goals Additional ADL Goal #1: Pt will verbalize and demo 3 energy conservation strategies for ADLs and ADL mobility  Plan       Co-evaluation                 AM-PAC OT "6 Clicks" Daily Activity     Outcome Measure   Help from another person eating meals?: None Help from another person taking care of personal grooming?: A Little Help from another person toileting, which includes using toliet, bedpan, or urinal?: A Little Help from another person bathing (including washing, rinsing, drying)?: A Lot Help from another person to put on and taking off regular upper body clothing?: A Little Help from another person to put on and taking off regular lower body clothing?: A Lot 6 Click Score: 17    End of Session Equipment Utilized During Treatment: Gait belt;Rolling walker (2 wheels)  OT Visit Diagnosis: Muscle weakness (generalized) (M62.81)   Activity Tolerance Patient tolerated treatment well   Patient Left in chair;with call bell/phone within reach   Nurse Communication          Time: 7846-9629 OT Time Calculation (min): 29 min  Charges: OT General Charges $OT Visit: 1 Visit OT Treatments $Self Care/Home Management : 8-22 mins $Therapeutic Activity: 8-22 mins    Galen Manila 08/30/2023, 2:11 PM

## 2023-08-30 NOTE — Progress Notes (Signed)
PROGRESS NOTE    Peter Terry  OZD:[id number] DOB: May 16, 1956 DOA: 08/25/2023 PCP: [name], MD  67/M w chronic diastolic CHF, COPD, hypertension, hyperlipidemia, diabetes, CKD 4, GERD, obesity, OSA presented to the ED with progressive dyspnea, followed by cardiology and nephrology, recently maintained on Bumex with metolazone every other day -Also has chronic wounds for over a year, worse in the right lower extremity -In the ED tachycardic, BUN 64, creatinine 4, WBC of 18.9, hemoglobin 9.2, troponin 46, 51, chest x-ray with cardiomegaly and pulmonary vascular congestion  Subjective: -Pain in left forearm at the site of IV  Assessment and Plan:  Acute on chronic diastolic CHF Volume overload, severe hypoalbuminemia -Last echo 9/24 at [facility] showed EF 60-65%, normal RV function. -volume overloaded and complicated by AKI/CKD 4 -Improving with diuresis, appreciate nephrology input, 8.5 L negative, weight down 26 LB, some uptrend in creatinine noted, 4.9 today -Nephrology following, continued on high-dose IV Lasix, Coreg, Imdur, hydralazine, -Jardiance and Aldactone restarted per nephrology -Increase activity, PT OT -Labs in a.m.  Right lower extremity cellulitis, superficial wounds -Chronic wounds worse on right lower extremity for> 1 year -Followed by vascular felt to be venous insufficiency wounds -With mild drainage, erythema warmth and foul smell -Started on ceftriaxone and Flagyl on admission, day 4/7 -ESR and CRP were significantly elevated, fortunately MRI was reassuring without deep infection -Continue wound care  AKI on CKD 4 -Baseline creatinine fluctuating from 2.7-3.3 range, now 4.9 -Cardiorenal, discontinued losartan, avoid hypotension -Renal ultrasound without hydronephrosis  Anemia of chronic disease and iron deficiency -Continue IV iron   COPD - Continue PRN albuterol   Hypertension - Continue carvedilol, hydralazine, Imdur,  - Lasix as above    Hyperlipidemia - Continue atorvastatin   Diabetes -Decrease insulin dose, continue glargine 25 units    GERD - Continue PPI   Obesity - Noted   OSA - Intolerant to CPAP   DVT prophylaxis:      Lovenox Code Status:              Full Family Communication:       None on admission  Disposition Plan:   Consultants:    Procedures:   Antimicrobials:    Objective: Vitals:   08/30/23 0451 08/30/23 0600 08/30/23 0737 08/30/23 0834  BP: (!) 147/77  125/65 125/65  Pulse: 81  80   Resp: 20 15 16    Temp: 97.7 F (36.5 C)  98.3 F (36.8 C)   TempSrc: Oral  Oral   SpO2:   100%   Weight: (!) 141.4 kg     Height:        Intake/Output Summary (Last 24 hours) at 08/30/2023 1130 Last data filed at 08/30/2023 0932 Gross per 24 hour  Intake 985.97 ml  Output 2575 ml  Net -1589.03 ml   Filed Weights   08/28/23 0455 08/29/23 0416 08/30/23 0451  Weight: (!) 144.3 kg (!) 142 kg (!) 141.4 kg    Examination:  General exam: Obese chronically ill male sitting up in bed, AAOx3 HEENT: Positive JVD CVS: S1-S2, regular rhythm Lungs: Decreased breath sounds at the bases Abdomen: Soft, nontender, bowel sounds present Extremities: 1+ edema, superficial lower extremity wounds, thick skin, lymphedema changes Psychiatry:  Mood & affect appropriate.     Data Reviewed:   CBC: Recent Labs  Lab 08/25/23 2021 08/26/23 1553 08/27/23 0514 08/28/23 0339 08/29/23 0409  WBC 18.9* 10.6* 7.8 8.3 10.0  NEUTROABS 17.3*  --   --   --   --  HGB 9.2* 8.4* 8.3* 8.2* 9.1*  HCT 28.5* 26.2* 25.7* 25.4* 27.8*  MCV 75.4* 75.9* 75.4* 74.7* 74.1*  PLT 214 197 200 196 240   Basic Metabolic Panel: Recent Labs  Lab 08/25/23 2021 08/26/23 1553 08/27/23 0514 08/28/23 0339 08/29/23 0409 08/30/23 0358  NA 137 141 138 138 137 137  K 3.8 3.4* 3.2* 3.3* 4.0 3.9  CL 104 106 103 101 100 99  CO2 23 25 23 23 22 23   GLUCOSE 288* 103* 137* 163* 183* 187*  BUN 64* 59* 65* 67* 68* 74*  CREATININE  3.96* 4.06* 4.37* 4.36* 4.57* 4.90*  CALCIUM 8.4* 8.5* 7.9* 8.0* 8.4* 8.3*  MG 1.7 1.8  --   --   --   --    GFR: Estimated Creatinine Clearance: 21 mL/min (A) (by C-G formula based on SCr of 4.9 mg/dL (H)). Liver Function Tests: Recent Labs  Lab 08/26/23 1553 08/27/23 0514 08/28/23 0339  AST 18 20 20   ALT 14 17 17   ALKPHOS 71 67 65  BILITOT 0.7 0.7 0.4  PROT 7.7 7.1 7.2  ALBUMIN 2.3* 2.1* 2.1*   No results for input(s): "LIPASE", "AMYLASE" in the last 168 hours. No results for input(s): "AMMONIA" in the last 168 hours. Coagulation Profile: No results for input(s): "INR", "PROTIME" in the last 168 hours. Cardiac Enzymes: No results for input(s): "CKTOTAL", "CKMB", "CKMBINDEX", "TROPONINI" in the last 168 hours. BNP (last 3 results) No results for input(s): "PROBNP" in the last 8760 hours. HbA1C: No results for input(s): "HGBA1C" in the last 72 hours. CBG: Recent Labs  Lab 08/29/23 0812 08/29/23 1111 08/29/23 1656 08/29/23 2126 08/30/23 0735  GLUCAP 190* 288* 183* 222* 221*   Lipid Profile: No results for input(s): "CHOL", "HDL", "LDLCALC", "TRIG", "CHOLHDL", "LDLDIRECT" in the last 72 hours. Thyroid Function Tests: No results for input(s): "TSH", "T4TOTAL", "FREET4", "T3FREE", "THYROIDAB" in the last 72 hours. Anemia Panel: No results for input(s): "VITAMINB12", "FOLATE", "FERRITIN", "TIBC", "IRON", "RETICCTPCT" in the last 72 hours.  Urine analysis:    Component Value Date/Time   COLORURINE YELLOW 08/25/2023 2252   APPEARANCEUR CLEAR 08/25/2023 2252   LABSPEC 1.015 08/25/2023 2252   PHURINE 6.5 08/25/2023 2252   GLUCOSEU 100 (A) 08/25/2023 2252   HGBUR TRACE (A) 08/25/2023 2252   BILIRUBINUR NEGATIVE 08/25/2023 2252   KETONESUR NEGATIVE 08/25/2023 2252   PROTEINUR >=300 (A) 08/25/2023 2252   NITRITE NEGATIVE 08/25/2023 2252   LEUKOCYTESUR NEGATIVE 08/25/2023 2252   Sepsis Labs: @LABRCNTIP (procalcitonin:4,lacticidven:4)  ) Recent Results (from the past  240 hours)  Resp panel by RT-PCR (RSV, Flu A&B, Covid) Anterior Nasal Swab     Status: None   Collection Time: 08/25/23  8:21 PM   Specimen: Anterior Nasal Swab  Result Value Ref Range Status   SARS Coronavirus 2 by RT PCR NEGATIVE NEGATIVE Final    Comment: (NOTE) SARS-CoV-2 target nucleic acids are NOT DETECTED.  The SARS-CoV-2 RNA is generally detectable in upper respiratory specimens during the acute phase of infection. The lowest concentration of SARS-CoV-2 viral copies this assay can detect is 138 copies/mL. A negative result does not preclude SARS-Cov-2 infection and should not be used as the sole basis for treatment or other patient management decisions. A negative result may occur with  improper specimen collection/handling, submission of specimen other than nasopharyngeal swab, presence of viral mutation(s) within the areas targeted by this assay, and inadequate number of viral copies(<138 copies/mL). A negative result must be combined with clinical observations, patient history, and epidemiological information.  The expected result is Negative.  Fact Sheet for Patients:  BloggerCourse.com  Fact Sheet for Healthcare Providers:  SeriousBroker.it  This test is no t yet approved or cleared by the [place] FDA and  has been authorized for detection and/or diagnosis of SARS-CoV-2 by FDA under an Emergency Use Authorization (EUA). This EUA will remain  in effect (meaning this test can be used) for the duration of the COVID-19 declaration under Section 564(b)(1) of the Act, 21 U.S.C.section 360bbb-3(b)(1), unless the authorization is terminated  or revoked sooner.       Influenza A by PCR NEGATIVE NEGATIVE Final   Influenza B by PCR NEGATIVE NEGATIVE Final    Comment: (NOTE) The Xpert Xpress SARS-CoV-2/FLU/RSV plus assay is intended as an aid in the diagnosis of influenza from Nasopharyngeal swab specimens and should  not be used as a sole basis for treatment. Nasal washings and aspirates are unacceptable for Xpert Xpress SARS-CoV-2/FLU/RSV testing.  Fact Sheet for Patients: BloggerCourse.com  Fact Sheet for Healthcare Providers: SeriousBroker.it  This test is not yet approved or cleared by the [place] FDA and has been authorized for detection and/or diagnosis of SARS-CoV-2 by FDA under an Emergency Use Authorization (EUA). This EUA will remain in effect (meaning this test can be used) for the duration of the COVID-19 declaration under Section 564(b)(1) of the Act, 21 U.S.C. section 360bbb-3(b)(1), unless the authorization is terminated or revoked.     Resp Syncytial Virus by PCR NEGATIVE NEGATIVE Final    Comment: (NOTE) Fact Sheet for Patients: BloggerCourse.com  Fact Sheet for Healthcare Providers: SeriousBroker.it  This test is not yet approved or cleared by the [place] FDA and has been authorized for detection and/or diagnosis of SARS-CoV-2 by FDA under an Emergency Use Authorization (EUA). This EUA will remain in effect (meaning this test can be used) for the duration of the COVID-19 declaration under Section 564(b)(1) of the Act, 21 U.S.C. section 360bbb-3(b)(1), unless the authorization is terminated or revoked.  Performed at [facility], [address], [place], [place] [address]      Radiology Studies: No results found.    Scheduled Meds:  atorvastatin  80 mg Oral QHS   carvedilol  25 mg Oral BID   empagliflozin  10 mg Oral Daily   enoxaparin (LOVENOX) injection  40 mg Subcutaneous QHS   ferrous sulfate  325 mg Oral TID WC   hydrALAZINE  75 mg Oral TID   insulin aspart  0-15 Units Subcutaneous TID WC   insulin aspart  0-5 Units Subcutaneous QHS   insulin glargine-yfgn  25 Units Subcutaneous BID   isosorbide mononitrate  30 mg Oral Daily    pantoprazole  40 mg Oral Daily   sodium chloride flush  3 mL Intravenous Q12H   spironolactone  25 mg Oral Daily   Continuous Infusions:  cefTRIAXone (ROCEPHIN)  IV 2 g (08/29/23 2324)   And   metronidazole 500 mg (08/30/23 1111)   furosemide 120 mg (08/30/23 0921)   iron sucrose Stopped (08/29/23 0931)     LOS: 4 days    Time spent:    Zannie Cove, MD [facility]   08/30/2023, 11:30 AM

## 2023-08-30 NOTE — Plan of Care (Signed)
  Problem: Coping: Goal: Ability to adjust to condition or change in health will improve Outcome: Progressing   Problem: Fluid Volume: Goal: Ability to maintain a balanced intake and output will improve Outcome: Progressing   Problem: Metabolic: Goal: Ability to maintain appropriate glucose levels will improve Outcome: Progressing   Problem: Nutritional: Goal: Maintenance of adequate nutrition will improve Outcome: Progressing   Problem: Clinical Measurements: Goal: Will remain free from infection Outcome: Progressing   Problem: Nutrition: Goal: Adequate nutrition will be maintained Outcome: Progressing   Problem: Elimination: Goal: Will not experience complications related to urinary retention Outcome: Progressing

## 2023-08-31 DIAGNOSIS — I5033 Acute on chronic diastolic (congestive) heart failure: Secondary | ICD-10-CM | POA: Diagnosis not present

## 2023-08-31 LAB — CBC
HCT: 27.9 % — ABNORMAL LOW (ref 39.0–52.0)
Hemoglobin: 8.9 g/dL — ABNORMAL LOW (ref 13.0–17.0)
MCH: 24 pg — ABNORMAL LOW (ref 26.0–34.0)
MCHC: 31.9 g/dL (ref 30.0–36.0)
MCV: 75.2 fL — ABNORMAL LOW (ref 80.0–100.0)
Platelets: 260 10*3/uL (ref 150–400)
RBC: 3.71 MIL/uL — ABNORMAL LOW (ref 4.22–5.81)
RDW: 16.1 % — ABNORMAL HIGH (ref 11.5–15.5)
WBC: 12.5 10*3/uL — ABNORMAL HIGH (ref 4.0–10.5)
nRBC: 0.2 % (ref 0.0–0.2)

## 2023-08-31 LAB — BASIC METABOLIC PANEL
Anion gap: 16 — ABNORMAL HIGH (ref 5–15)
BUN: 82 mg/dL — ABNORMAL HIGH (ref 8–23)
CO2: 21 mmol/L — ABNORMAL LOW (ref 22–32)
Calcium: 8 mg/dL — ABNORMAL LOW (ref 8.9–10.3)
Chloride: 99 mmol/L (ref 98–111)
Creatinine, Ser: 5.04 mg/dL — ABNORMAL HIGH (ref 0.61–1.24)
GFR, Estimated: 12 mL/min — ABNORMAL LOW (ref >60)
Glucose, Bld: 216 mg/dL — ABNORMAL HIGH (ref 70–99)
Potassium: 3.4 mmol/L — ABNORMAL LOW (ref 3.5–5.1)
Sodium: 136 mmol/L (ref 135–145)

## 2023-08-31 LAB — GLUCOSE, CAPILLARY
Glucose-Capillary: 166 mg/dL — ABNORMAL HIGH (ref 70–99)
Glucose-Capillary: 208 mg/dL — ABNORMAL HIGH (ref 70–99)
Glucose-Capillary: 239 mg/dL — ABNORMAL HIGH (ref 70–99)
Glucose-Capillary: 226 mg/dL — ABNORMAL HIGH (ref 70–99)

## 2023-08-31 MED ORDER — CEPHALEXIN 250 MG PO CAPS
250.0000 mg | ORAL_CAPSULE | Freq: Two times a day (BID) | ORAL | Status: AC
  Administered 2023-08-31 – 2023-09-01 (×4): 250 mg via ORAL
  Filled 2023-08-31 (×4): qty 1

## 2023-08-31 MED ORDER — FUROSEMIDE 10 MG/ML IJ SOLN
80.0000 mg | Freq: Two times a day (BID) | INTRAMUSCULAR | Status: DC
  Administered 2023-08-31 – 2023-09-02 (×4): 80 mg via INTRAVENOUS
  Filled 2023-08-31 (×4): qty 8

## 2023-08-31 MED ORDER — POTASSIUM CHLORIDE CRYS ER 20 MEQ PO TBCR
40.0000 meq | EXTENDED_RELEASE_TABLET | Freq: Once | ORAL | Status: AC
  Administered 2023-08-31: 40 meq via ORAL
  Filled 2023-08-31: qty 2

## 2023-08-31 NOTE — Care Management Important Message (Signed)
Important Message  Patient Details  Name: Peter Terry MRN: [id number] Date of Birth: 03/02/1956   Important Message Given:  Yes - Medicare IM     Dorena Bodo 08/31/2023, 10:56 AM

## 2023-08-31 NOTE — Progress Notes (Signed)
Nephrology Follow-Up Consult note   Assessment/Recommendations: Peter Terry is a  68 y.o. male with a past medical history significant for HTN, HLD, COPD, DM 2, GERD, obesity, CKD 4 who present w/ heart Failure exacerbation, CAD by AKI    AKI on CKD 4: Baseline creatinine around 3.5.  Appears massively volume overloaded on exam.  Responding moderately to diuresis.  Creatinine has risen slightly as expected with diuresis but he still has evidence of volume on board. Likely will need to back off or hold tomorrow if renal function continues to worsen.  -Diuretics as below -Continue to monitor daily Cr, Dose meds for GFR -Monitor Daily I/Os, Daily weight  -Maintain MAP>65 for optimal renal perfusion.  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  Continue Lasix 120 mg 3 times daily.  Continue spironolactone.  Started SGLT2 inhibitor.  Metolazone if needed but still good UOP and renal function worsening so would not start for now.     Net -10 L during this hospitalization but unfortunately renal function continues to trend in the wrong direction.  Will decrease Lasix from 120 mg Q 8 hours to 80 mg twice daily.   Leukocytosis/right lower extremity wounds: Wound care involved.  Management per primary team.  On antibiotics   Hypertension: Blood pressure initially on the lower side.  Held amlodipine.  Continue other medications as prescribed   Hyperlipidemia: On atorvastatin   Uncontrolled Diabetes Mellitus Type 2 with Hyperglycemia: Management per primary    Hypokalemia: Improved.  Continue spironolactone   Anemia: Likely multifactorial.  Transfuse if needed.  Iron deficient.  Providing IV iron    Ethelene Hal [facility] 08/31/2023 9:17 AM  ___________________________________________________________  CC: Shortness of  breath  Interval History/Subjective: Patient feels about the same today.  Continues to have lower extremity pain.  Shortness of breath improved he was able to do some walking yesterday. Urine output continues to be good.  Creatinine slightly higher   Medications:  Current Facility-Administered Medications  Medication Dose Route Frequency Provider Last Rate Last Admin   acetaminophen (TYLENOL) tablet 650 mg  650 mg Oral Q6H PRN Synetta Fail, MD       Or   acetaminophen (TYLENOL) suppository 650 mg  650 mg Rectal Q6H PRN Synetta Fail, MD       albuterol (PROVENTIL) (2.5 MG/3ML) 0.083% nebulizer solution 2.5 mg  2.5 mg Inhalation Q4H PRN Synetta Fail, MD       atorvastatin (LIPITOR) tablet 80 mg  80 mg Oral QHS Synetta Fail, MD   80 mg at 08/30/23 2203   carvedilol (COREG) tablet 25 mg  25 mg Oral BID Synetta Fail, MD   25 mg at 08/31/23 0746   cefTRIAXone (ROCEPHIN) 2 g in sodium chloride 0.9 % 100 mL IVPB  2 g Intravenous Q24H Synetta Fail, MD 200 mL/hr at 08/30/23 1954 2 g at 08/30/23 1954   And   metroNIDAZOLE (FLAGYL) IVPB 500 mg  500 mg Intravenous Q12H Synetta Fail, MD 100 mL/hr at 08/31/23 0755 500 mg at 08/31/23 0755   empagliflozin (JARDIANCE) tablet 10 mg  10 mg Oral Daily Darnell Level, MD   10 mg at 08/31/23 0747   enoxaparin (LOVENOX) injection 30 mg  30 mg Subcutaneous QHS Silvana Newness, RPH   30 mg at 08/30/23 2203   ferrous sulfate tablet  325 mg  325 mg Oral TID WC Synetta Fail, MD   325 mg at 08/31/23 0746   furosemide (LASIX) 120 mg in dextrose 5 % 50 mL IVPB  120 mg Intravenous TID Darnell Level, MD 62 mL/hr at 08/30/23 2209 120 mg at 08/30/23 2209   hydrALAZINE (APRESOLINE) tablet 75 mg  75 mg Oral TID Zannie Cove, MD   75 mg at 08/31/23 0747   insulin aspart (novoLOG) injection 0-15 Units  0-15 Units Subcutaneous TID WC Synetta Fail, MD   3 Units at 08/31/23 0746   insulin aspart (novoLOG) injection  0-5 Units  0-5 Units Subcutaneous QHS Synetta Fail, MD   2 Units at 08/30/23 2202   insulin glargine-yfgn [facility]) injection 30 Units  30 Units Subcutaneous BID Zannie Cove, MD   30 Units at 08/31/23 0747   iron sucrose (VENOFER) 200 mg in sodium chloride 0.9 % 100 mL IVPB  200 mg Intravenous Q24H Darnell Level, MD 440 mL/hr at 08/31/23 0756 200 mg at 08/31/23 0756   isosorbide mononitrate (IMDUR) 24 hr tablet 30 mg  30 mg Oral Daily Zannie Cove, MD   30 mg at 08/31/23 0748   ondansetron (ZOFRAN) injection 4 mg  4 mg Intravenous Q6H PRN Zannie Cove, MD   4 mg at 08/29/23 1134   pantoprazole (PROTONIX) EC tablet 40 mg  40 mg Oral Daily Synetta Fail, MD   40 mg at 08/31/23 0748   polyethylene glycol (MIRALAX / GLYCOLAX) packet 17 g  17 g Oral Daily PRN Synetta Fail, MD       sodium chloride flush (NS) 0.9 % injection 3 mL  3 mL Intravenous Q12H Synetta Fail, MD   3 mL at 08/31/23 0749   spironolactone (ALDACTONE) tablet 25 mg  25 mg Oral Daily Darnell Level, MD   25 mg at 08/30/23 0834   traMADol (ULTRAM) tablet 50 mg  50 mg Oral Q8H PRN Synetta Fail, MD   50 mg at 08/30/23 1945      Review of Systems: 10 systems reviewed and negative except per interval history/subjective  Physical Exam: Vitals:   08/31/23 0348 08/31/23 0731  BP: 127/77 135/77  Pulse: 86 88  Resp: 18 18  Temp: 98.6 F (37 C) 98.7 F (37.1 C)  SpO2:  94%   No intake/output data recorded.  Intake/Output Summary (Last 24 hours) at 08/31/2023 4098 Last data filed at 08/31/2023 0700 Gross per 24 hour  Intake 1920 ml  Output 3450 ml  Net -1530 ml   Constitutional: Obese, sitting, no distress ENMT: ears and nose without scars or lesions, MMM CV: normal rate, 1+ pitting edema in the bilateral lower extremities Respiratory: Bilateral chest rise with no increased work of breathing Gastrointestinal: soft, non-tender, moderate distention Skin: wound on right lower  extremity wrapped, otherwise no visible lesions or rashes Psych: alert, judgement/insight appropriate, appropriate mood and affect   Test Results I personally reviewed new and old clinical labs and radiology tests Lab Results  Component Value Date   NA 136 08/31/2023   K 3.4 (L) 08/31/2023   CL 99 08/31/2023   CO2 21 (L) 08/31/2023   BUN 82 (H) 08/31/2023   CREATININE 5.04 (H) 08/31/2023   GFR 13.47 (LL) 05/12/2023   CALCIUM 8.0 (L) 08/31/2023   ALBUMIN 2.1 (L) 08/28/2023    CBC Recent Labs  Lab 08/25/23 2021 08/26/23 1553 08/28/23 0339 08/29/23 0409 08/31/23 0346  WBC 18.9*   < >  8.3 10.0 12.5*  NEUTROABS 17.3*  --   --   --   --   HGB 9.2*   < > 8.2* 9.1* 8.9*  HCT 28.5*   < > 25.4* 27.8* 27.9*  MCV 75.4*   < > 74.7* 74.1* 75.2*  PLT 214   < > 196 240 260   < > = values in this interval not displayed.

## 2023-08-31 NOTE — Progress Notes (Signed)
Mobility Specialist Progress Note;   08/31/23 1500  Mobility  Activity Ambulated with assistance in hallway  Level of Assistance Contact guard assist, steadying assist  Assistive Device None  Distance Ambulated (ft) 175 ft  Activity Response Tolerated well  Mobility Referral Yes  Mobility visit 1 Mobility  Mobility Specialist Start Time (ACUTE ONLY) 1500  Mobility Specialist Stop Time (ACUTE ONLY) 1515  Mobility Specialist Time Calculation (min) (ACUTE ONLY) 15 min   Pt agreeable to mobility w/ encouragement- received in chair. Required MinG assistance throughout ambulation for safety. Took 2x standing rest breaks d/t fatigue and displayed SOB. VSS throughout. Pt requesting to get back in bed at EOS. Stated he will see me again tomorrow to walk. Pt left in bed with all needs met, alarm on.   Peter Terry Mobility Specialist Please contact via SecureChat or [employer] [phone]

## 2023-08-31 NOTE — Progress Notes (Signed)
Physical Therapy Treatment Patient Details Name: Peter Terry MRN: [id number] DOB: 1956-06-23 Today's Date: 08/31/2023   History of Present Illness 68 y.o. male admitted 08/25/23 with SOB; workup for volume overload, acute on chronic diastolic CHF, RLE cellulitis, AKI on CKD 4. PMH of CHF, COPD, HTN, HLD, DM, CKD 4, GERD, obesity, OSA.   PT Comments  Pt progressing with mobility. Today's session focused on ambulation for improving strength and activity tolerance; pt ambulating without DME, supervision for safety/lines. Encouragement and education on importance of increased mobility with nursing staff and mobility specialists as pt has not walked since initial PT Evaluation 1/18. Will continue to follow acutely to address established goals.     If plan is discharge home, recommend the following: A little help with bathing/dressing/bathroom;Assistance with cooking/housework   Can travel by private vehicle      Yes  Equipment Recommendations  None recommended by PT    Recommendations for Other Services  Mobility Specialist     Precautions / Restrictions Precautions Precautions: Fall Restrictions Weight Bearing Restrictions Per Provider Order: No     Mobility  Bed Mobility               General bed mobility comments: received sitting in recliner    Transfers Overall transfer level: Independent Equipment used: None Transfers: Sit to/from Stand                  Ambulation/Gait Ambulation/Gait assistance: Chief Operating Officer (Feet): 230 Feet Assistive device: None, IV Pole Gait Pattern/deviations: Step-through pattern, Decreased stride length, Wide base of support Gait velocity: Decreased     General Gait Details: slow, guarded gait with and without pushing IV pole, supervision for safety/lines; pt demonstrates good awareness of activity pacing, reports distance limited by chronic back pain   Stairs             Wheelchair Mobility     Tilt  Bed    Modified Rankin (Stroke Patients Only)       Balance Overall balance assessment: Needs assistance Sitting-balance support: No upper extremity supported Sitting balance-Leahy Scale: Good     Standing balance support: No upper extremity supported, During functional activity Standing balance-Leahy Scale: Good                              Cognition Arousal: Alert Behavior During Therapy: WFL for tasks assessed/performed Overall Cognitive Status: Within Functional Limits for tasks assessed                                          Exercises      General Comments General comments (skin integrity, edema, etc.): pt has not walked in hallway since initial PT Evaluation 08/28/23, reports due to not feeling well. educ on importance on more frequent activity/ambulation, including working with mobility specialists. further educ re: activity recmomendations, discharge needs      Pertinent Vitals/Pain Pain Assessment Pain Assessment: Faces Faces Pain Scale: No hurt Pain Intervention(s): Monitored during session    Home Living                          Prior Function            PT Goals (current goals can now be found in the care plan section) Progress towards PT goals:  Progressing toward goals    Frequency    Min 1X/week      PT Plan      Co-evaluation              AM-PAC PT "6 Clicks" Mobility   Outcome Measure  Help needed turning from your back to your side while in a flat bed without using bedrails?: None Help needed moving from lying on your back to sitting on the side of a flat bed without using bedrails?: None Help needed moving to and from a bed to a chair (including a wheelchair)?: None Help needed standing up from a chair using your arms (e.g., wheelchair or bedside chair)?: None Help needed to walk in hospital room?: A Little Help needed climbing 3-5 steps with a railing? : A Little 6 Click Score: 22     End of Session   Activity Tolerance: Patient tolerated treatment well Patient left: in chair;with call bell/phone within reach Nurse Communication: Mobility status PT Visit Diagnosis: Unsteadiness on feet (R26.81);Other abnormalities of gait and mobility (R26.89);Muscle weakness (generalized) (M62.81)     Time: 9562-1308 PT Time Calculation (min) (ACUTE ONLY): 18 min  Charges:    $Therapeutic Exercise: 8-22 mins PT General Charges $$ ACUTE PT VISIT: 1 Visit                     Ina Homes, PT, DPT Acute Rehabilitation Services  Personal: Secure Chat Rehab Office: [phone]  Malachy Chamber 08/31/2023, 9:46 AM

## 2023-08-31 NOTE — Progress Notes (Signed)
PROGRESS NOTE    Peter Terry  VWU:[id number] DOB: [birth year] DOA: 08/25/2023 PCP: [name], MD  67/M w chronic diastolic CHF, COPD, hypertension, hyperlipidemia, diabetes, CKD 4, GERD, obesity, OSA presented to the ED with progressive dyspnea, followed by cardiology and nephrology, recently maintained on Bumex with metolazone every other day -Also has chronic wounds for over a year, worse in the right lower extremity -In the ED tachycardic, BUN 64, creatinine 4, WBC of 18.9, hemoglobin 9.2, troponin 46, 51, chest x-ray with cardiomegaly and pulmonary vascular congestion -Nephrology consulting, started on diuretics, complicated by worsening AKI, volume status improving -Also on antibiotics for right lower extremity cellulitis  Subjective: -Pain in left forearm at the site of IV  Assessment and Plan:  Acute on chronic diastolic CHF Volume overload, severe hypoalbuminemia -Last echo 9/24 at [facility] showed EF 60-65%, normal RV function. -volume overloaded and complicated by AKI/CKD 4 -Improving with diuresis, appreciate nephrology input, weight down 28 LB, mild uptrend in creatinine continues, 5.0 today  -Nephrology following, Lasix dose decreased, continue Coreg Imdur, hydralazine, -Will discontinue Jardiance and Aldactone -Increase activity, PT OT -Labs in a.m.  Right lower extremity cellulitis, superficial wounds -Chronic wounds worse on right lower extremity for> 1 year -Followed by vascular felt to be venous insufficiency wounds -With mild drainage, erythema warmth and foul smell -Started on ceftriaxone and Flagyl on admission, day 5/7 will change to oral Keflex for 2 days -ESR and CRP were significantly elevated, fortunately MRI was reassuring without deep infection -Continue wound care  AKI on CKD 4 -Baseline creatinine fluctuating from 2.7-3.3 range, now 5 -Cardiorenal, discontinued losartan, avoid hypotension -Discontinue Jardiance, Aldactone -Renal ultrasound  without hydronephrosis -See discussion above, nephrology following  Anemia of chronic disease and iron deficiency -Continue IV iron   COPD - Continue PRN albuterol   Hypertension - Continue carvedilol, hydralazine, Imdur,  - Lasix as above   Hyperlipidemia - Continue atorvastatin   Diabetes -Decrease insulin dose, continue glargine 25 units    GERD - Continue PPI   Obesity - Noted   OSA - Intolerant to CPAP   DVT prophylaxis:      Lovenox Code Status:              Full Family Communication:       None on admission  Disposition Plan: Home once stable  Consultants:    Procedures:   Antimicrobials:    Objective: Vitals:   08/30/23 2203 08/31/23 0348 08/31/23 0731 08/31/23 1157  BP: 129/64 127/77 135/77 131/74  Pulse:  86 88 72  Resp:  18 18 15   Temp:  98.6 F (37 C) 98.7 F (37.1 C) 98.5 F (36.9 C)  TempSrc:  Oral Oral Oral  SpO2:   94% 94%  Weight:  (!) 140.2 kg    Height:        Intake/Output Summary (Last 24 hours) at 08/31/2023 1202 Last data filed at 08/31/2023 1158 Gross per 24 hour  Intake 2740.73 ml  Output 3250 ml  Net -509.27 ml   Filed Weights   08/29/23 0416 08/30/23 0451 08/31/23 0348  Weight: (!) 142 kg (!) 141.4 kg (!) 140.2 kg    Examination:  General exam: Obese chronically ill male sitting up in bed, AAOx3 HEENT: Positive JVD CVS: S1-S2, regular rhythm Lungs: Decreased breath sounds at the bases Abdomen: Soft, nontender, bowel sounds present Extremities: 1+ edema, superficial lower extremity wounds, thick skin, lymphedema changes Psychiatry:  Mood & affect appropriate.     Data Reviewed:  CBC: Recent Labs  Lab 08/25/23 2021 08/26/23 1553 08/27/23 0514 08/28/23 0339 08/29/23 0409 08/31/23 0346  WBC 18.9* 10.6* 7.8 8.3 10.0 12.5*  NEUTROABS 17.3*  --   --   --   --   --   HGB 9.2* 8.4* 8.3* 8.2* 9.1* 8.9*  HCT 28.5* 26.2* 25.7* 25.4* 27.8* 27.9*  MCV 75.4* 75.9* 75.4* 74.7* 74.1* 75.2*  PLT 214 197 200 196  240 260   Basic Metabolic Panel: Recent Labs  Lab 08/25/23 2021 08/26/23 1553 08/27/23 0514 08/28/23 0339 08/29/23 0409 08/30/23 0358 08/31/23 0346  NA 137 141 138 138 137 137 136  K 3.8 3.4* 3.2* 3.3* 4.0 3.9 3.4*  CL 104 106 103 101 100 99 99  CO2 23 25 23 23 22 23  21*  GLUCOSE 288* 103* 137* 163* 183* 187* 216*  BUN 64* 59* 65* 67* 68* 74* 82*  CREATININE 3.96* 4.06* 4.37* 4.36* 4.57* 4.90* 5.04*  CALCIUM 8.4* 8.5* 7.9* 8.0* 8.4* 8.3* 8.0*  MG 1.7 1.8  --   --   --   --   --    GFR: Estimated Creatinine Clearance: 20.4 mL/min (A) (by C-G formula based on SCr of 5.04 mg/dL (H)). Liver Function Tests: Recent Labs  Lab 08/26/23 1553 08/27/23 0514 08/28/23 0339  AST 18 20 20   ALT 14 17 17   ALKPHOS 71 67 65  BILITOT 0.7 0.7 0.4  PROT 7.7 7.1 7.2  ALBUMIN 2.3* 2.1* 2.1*   No results for input(s): "LIPASE", "AMYLASE" in the last 168 hours. No results for input(s): "AMMONIA" in the last 168 hours. Coagulation Profile: No results for input(s): "INR", "PROTIME" in the last 168 hours. Cardiac Enzymes: No results for input(s): "CKTOTAL", "CKMB", "CKMBINDEX", "TROPONINI" in the last 168 hours. BNP (last 3 results) No results for input(s): "PROBNP" in the last 8760 hours. HbA1C: No results for input(s): "HGBA1C" in the last 72 hours. CBG: Recent Labs  Lab 08/30/23 1220 08/30/23 1625 08/30/23 2105 08/31/23 0728 08/31/23 1145  GLUCAP 212* 194* 213* 166* 208*   Lipid Profile: No results for input(s): "CHOL", "HDL", "LDLCALC", "TRIG", "CHOLHDL", "LDLDIRECT" in the last 72 hours. Thyroid Function Tests: No results for input(s): "TSH", "T4TOTAL", "FREET4", "T3FREE", "THYROIDAB" in the last 72 hours. Anemia Panel: No results for input(s): "VITAMINB12", "FOLATE", "FERRITIN", "TIBC", "IRON", "RETICCTPCT" in the last 72 hours.  Urine analysis:    Component Value Date/Time   COLORURINE YELLOW 08/25/2023 2252   APPEARANCEUR CLEAR 08/25/2023 2252   LABSPEC 1.015  08/25/2023 2252   PHURINE 6.5 08/25/2023 2252   GLUCOSEU 100 (A) 08/25/2023 2252   HGBUR TRACE (A) 08/25/2023 2252   BILIRUBINUR NEGATIVE 08/25/2023 2252   KETONESUR NEGATIVE 08/25/2023 2252   PROTEINUR >=300 (A) 08/25/2023 2252   NITRITE NEGATIVE 08/25/2023 2252   LEUKOCYTESUR NEGATIVE 08/25/2023 2252   Sepsis Labs: @LABRCNTIP (procalcitonin:4,lacticidven:4)  ) Recent Results (from the past 240 hours)  Resp panel by RT-PCR (RSV, Flu A&B, Covid) Anterior Nasal Swab     Status: None   Collection Time: 08/25/23  8:21 PM   Specimen: Anterior Nasal Swab  Result Value Ref Range Status   SARS Coronavirus 2 by RT PCR NEGATIVE NEGATIVE Final    Comment: (NOTE) SARS-CoV-2 target nucleic acids are NOT DETECTED.  The SARS-CoV-2 RNA is generally detectable in upper respiratory specimens during the acute phase of infection. The lowest concentration of SARS-CoV-2 viral copies this assay can detect is 138 copies/mL. A negative result does not preclude SARS-Cov-2 infection and should not be  used as the sole basis for treatment or other patient management decisions. A negative result may occur with  improper specimen collection/handling, submission of specimen other than nasopharyngeal swab, presence of viral mutation(s) within the areas targeted by this assay, and inadequate number of viral copies(<138 copies/mL). A negative result must be combined with clinical observations, patient history, and epidemiological information. The expected result is Negative.  Fact Sheet for Patients:  BloggerCourse.com  Fact Sheet for Healthcare Providers:  SeriousBroker.it  This test is no t yet approved or cleared by the [place] FDA and  has been authorized for detection and/or diagnosis of SARS-CoV-2 by FDA under an Emergency Use Authorization (EUA). This EUA will remain  in effect (meaning this test can be used) for the duration of the COVID-19  declaration under Section 564(b)(1) of the Act, 21 U.S.C.section 360bbb-3(b)(1), unless the authorization is terminated  or revoked sooner.       Influenza A by PCR NEGATIVE NEGATIVE Final   Influenza B by PCR NEGATIVE NEGATIVE Final    Comment: (NOTE) The Xpert Xpress SARS-CoV-2/FLU/RSV plus assay is intended as an aid in the diagnosis of influenza from Nasopharyngeal swab specimens and should not be used as a sole basis for treatment. Nasal washings and aspirates are unacceptable for Xpert Xpress SARS-CoV-2/FLU/RSV testing.  Fact Sheet for Patients: BloggerCourse.com  Fact Sheet for Healthcare Providers: SeriousBroker.it  This test is not yet approved or cleared by the [place] FDA and has been authorized for detection and/or diagnosis of SARS-CoV-2 by FDA under an Emergency Use Authorization (EUA). This EUA will remain in effect (meaning this test can be used) for the duration of the COVID-19 declaration under Section 564(b)(1) of the Act, 21 U.S.C. section 360bbb-3(b)(1), unless the authorization is terminated or revoked.     Resp Syncytial Virus by PCR NEGATIVE NEGATIVE Final    Comment: (NOTE) Fact Sheet for Patients: BloggerCourse.com  Fact Sheet for Healthcare Providers: SeriousBroker.it  This test is not yet approved or cleared by the [place] FDA and has been authorized for detection and/or diagnosis of SARS-CoV-2 by FDA under an Emergency Use Authorization (EUA). This EUA will remain in effect (meaning this test can be used) for the duration of the COVID-19 declaration under Section 564(b)(1) of the Act, 21 U.S.C. section 360bbb-3(b)(1), unless the authorization is terminated or revoked.  Performed at [facility], [address] [place], [place] [address]      Radiology Studies: No results found.    Scheduled Meds:  atorvastatin   80 mg Oral QHS   carvedilol  25 mg Oral BID   empagliflozin  10 mg Oral Daily   enoxaparin (LOVENOX) injection  30 mg Subcutaneous QHS   ferrous sulfate  325 mg Oral TID WC   furosemide  80 mg Intravenous BID   hydrALAZINE  75 mg Oral TID   insulin aspart  0-15 Units Subcutaneous TID WC   insulin aspart  0-5 Units Subcutaneous QHS   insulin glargine-yfgn  30 Units Subcutaneous BID   isosorbide mononitrate  30 mg Oral Daily   pantoprazole  40 mg Oral Daily   sodium chloride flush  3 mL Intravenous Q12H   spironolactone  25 mg Oral Daily   Continuous Infusions:  cefTRIAXone (ROCEPHIN)  IV Stopped (08/30/23 2024)   And   metronidazole Stopped (08/31/23 0858)   iron sucrose 440 mL/hr at 08/31/23 0931     LOS: 5 days    Time spent:  Zannie Cove, MD [facility]   08/31/2023, 12:02 PM

## 2023-08-31 NOTE — Plan of Care (Signed)
  Problem: Education: Goal: Ability to describe self-care measures that may prevent or decrease complications (Diabetes Survival Skills Education) will improve Outcome: Progressing Goal: Individualized Educational Video(s) Outcome: Progressing   Problem: Coping: Goal: Ability to adjust to condition or change in health will improve Outcome: Progressing   Problem: Fluid Volume: Goal: Ability to maintain a balanced intake and output will improve Outcome: Progressing   Problem: Health Behavior/Discharge Planning: Goal: Ability to identify and utilize available resources and services will improve Outcome: Progressing Goal: Ability to manage health-related needs will improve Outcome: Progressing   Problem: Metabolic: Goal: Ability to maintain appropriate glucose levels will improve Outcome: Progressing   Problem: Nutritional: Goal: Maintenance of adequate nutrition will improve Outcome: Progressing Goal: Progress toward achieving an optimal weight will improve Outcome: Progressing   Problem: Skin Integrity: Goal: Risk for impaired skin integrity will decrease Outcome: Progressing   Problem: Tissue Perfusion: Goal: Adequacy of tissue perfusion will improve Outcome: Progressing   Problem: Education: Goal: Knowledge of General Education information will improve Description: Including pain rating scale, medication(s)/side effects and non-pharmacologic comfort measures Outcome: Progressing   Problem: Health Behavior/Discharge Planning: Goal: Ability to manage health-related needs will improve Outcome: Progressing   Problem: Clinical Measurements: Goal: Ability to maintain clinical measurements within normal limits will improve Outcome: Progressing Goal: Will remain free from infection Outcome: Progressing Goal: Diagnostic test results will improve Outcome: Progressing Goal: Respiratory complications will improve Outcome: Progressing Goal: Cardiovascular complication will  be avoided Outcome: Progressing   Problem: Activity: Goal: Risk for activity intolerance will decrease Outcome: Progressing   Problem: Nutrition: Goal: Adequate nutrition will be maintained Outcome: Progressing   Problem: Coping: Goal: Level of anxiety will decrease Outcome: Progressing   Problem: Elimination: Goal: Will not experience complications related to bowel motility Outcome: Progressing Goal: Will not experience complications related to urinary retention Outcome: Progressing   Problem: Pain Managment: Goal: General experience of comfort will improve and/or be controlled Outcome: Progressing   Problem: Safety: Goal: Ability to remain free from injury will improve Outcome: Progressing   Problem: Skin Integrity: Goal: Risk for impaired skin integrity will decrease Outcome: Progressing   Problem: Education: Goal: Ability to demonstrate management of disease process will improve Outcome: Progressing Goal: Ability to verbalize understanding of medication therapies will improve Outcome: Progressing Goal: Individualized Educational Video(s) Outcome: Progressing   Problem: Activity: Goal: Capacity to carry out activities will improve Outcome: Progressing   Problem: Cardiac: Goal: Ability to achieve and maintain adequate cardiopulmonary perfusion will improve Outcome: Progressing   Problem: Education: Goal: Ability to demonstrate management of disease process will improve Outcome: Progressing Goal: Ability to verbalize understanding of medication therapies will improve Outcome: Progressing Goal: Individualized Educational Video(s) Outcome: Progressing   Problem: Activity: Goal: Capacity to carry out activities will improve Outcome: Progressing   Problem: Cardiac: Goal: Ability to achieve and maintain adequate cardiopulmonary perfusion will improve Outcome: Progressing

## 2023-09-01 DIAGNOSIS — N179 Acute kidney failure, unspecified: Secondary | ICD-10-CM | POA: Diagnosis not present

## 2023-09-01 DIAGNOSIS — D638 Anemia in other chronic diseases classified elsewhere: Secondary | ICD-10-CM

## 2023-09-01 DIAGNOSIS — I10 Essential (primary) hypertension: Secondary | ICD-10-CM | POA: Diagnosis not present

## 2023-09-01 DIAGNOSIS — E66813 Obesity, class 3: Secondary | ICD-10-CM

## 2023-09-01 DIAGNOSIS — E785 Hyperlipidemia, unspecified: Secondary | ICD-10-CM

## 2023-09-01 DIAGNOSIS — E1169 Type 2 diabetes mellitus with other specified complication: Secondary | ICD-10-CM

## 2023-09-01 DIAGNOSIS — L03115 Cellulitis of right lower limb: Secondary | ICD-10-CM | POA: Diagnosis not present

## 2023-09-01 DIAGNOSIS — I5033 Acute on chronic diastolic (congestive) heart failure: Secondary | ICD-10-CM | POA: Diagnosis not present

## 2023-09-01 HISTORY — DX: Obesity, class 3: E66.813

## 2023-09-01 HISTORY — DX: Cellulitis of right lower limb: L03.115

## 2023-09-01 HISTORY — DX: Anemia in other chronic diseases classified elsewhere: D63.8

## 2023-09-01 LAB — BASIC METABOLIC PANEL
Anion gap: 13 (ref 5–15)
BUN: 79 mg/dL — ABNORMAL HIGH (ref 8–23)
CO2: 25 mmol/L (ref 22–32)
Calcium: 8.4 mg/dL — ABNORMAL LOW (ref 8.9–10.3)
Chloride: 100 mmol/L (ref 98–111)
Creatinine, Ser: 4.6 mg/dL — ABNORMAL HIGH (ref 0.61–1.24)
GFR, Estimated: 13 mL/min — ABNORMAL LOW (ref >60)
Glucose, Bld: 148 mg/dL — ABNORMAL HIGH (ref 70–99)
Potassium: 3.7 mmol/L (ref 3.5–5.1)
Sodium: 138 mmol/L (ref 135–145)

## 2023-09-01 LAB — GLUCOSE, CAPILLARY
Glucose-Capillary: 138 mg/dL — ABNORMAL HIGH (ref 70–99)
Glucose-Capillary: 180 mg/dL — ABNORMAL HIGH (ref 70–99)
Glucose-Capillary: 217 mg/dL — ABNORMAL HIGH (ref 70–99)
Glucose-Capillary: 170 mg/dL — ABNORMAL HIGH (ref 70–99)

## 2023-09-01 NOTE — Progress Notes (Signed)
Occupational Therapy Treatment Patient Details Name: Peter Terry MRN: [id number] DOB: 1955-10-23 Today's Date: 09/01/2023   History of present illness 68 y.o. male admitted 08/25/23 with SOB; workup for volume overload, acute on chronic diastolic CHF, RLE cellulitis, AKI on CKD 4. PMH of CHF, COPD, HTN, HLD, DM, CKD 4, GERD, obesity, OSA.   OT comments  Pt received up in recliner upon OT arrival, reporting that he is feeling "nauseous" and feeling a "little depressed".  Pt refusing any mobility or therapeutic activity at this time but agreeable to reviewing energy conservation strategies and recommendations for DME for increased safety/endurance with bathing.  OT referenced handouts provided during previous session, pt verbalizes understanding. Pt states that he is "still thinking about it" for receiving [facility] therapy services after acute stay d/c. OT will continue to follow acutely to maximize level of function and safety       If plan is discharge home, recommend the following:  A lot of help with bathing/dressing/bathroom;A little help with walking and/or transfers;Assistance with cooking/housework;Help with stairs or ramp for entrance;Assist for transportation   Equipment Recommendations  Tub/shower bench    Recommendations for Other Services      Precautions / Restrictions Precautions Precautions: Fall Precaution Comments: watch O2 SATs Restrictions Weight Bearing Restrictions Per Provider Order: No       Mobility Bed Mobility               General bed mobility comments: received sitting in recliner; pt refused any mobility at this time due to reports of nausea                     ADL either performed or assessed with clinical judgement   ADL Overall ADL's : Needs assistance/impaired                                       General ADL Comments: OT reiterated education on energy conservation strategies as well as use of DME for increased safety  in the bathroom with bathing.  OT reiterated handouts provided from previous session, pt verbalizes understanding      Cognition Arousal: Alert Behavior During Therapy: WFL for tasks assessed/performed Overall Cognitive Status: Within Functional Limits for tasks assessed                                 General Comments: reports he has been dealing with depression lately, but declined resources (contacting MD, chaplain services, case manager for counseling referral options etc) offered by OT this session but appreciative of offer                   Pertinent Vitals/ Pain       Pain Assessment Pain Assessment: Faces Faces Pain Scale: No hurt Pain Intervention(s): Limited activity within patient's tolerance         Frequency  Min 2X/week        Progress Toward Goals  OT Goals(current goals can now be found in the care plan section)  Progress towards OT goals: Progressing toward goals      AM-PAC OT "6 Clicks" Daily Activity     Outcome Measure   Help from another person eating meals?: None Help from another person taking care of personal grooming?: A Little Help from another person toileting, which includes using toliet, bedpan,  or urinal?: A Little Help from another person bathing (including washing, rinsing, drying)?: A Lot Help from another person to put on and taking off regular upper body clothing?: A Little Help from another person to put on and taking off regular lower body clothing?: A Lot 6 Click Score: 17    End of Session    OT Visit Diagnosis: Muscle weakness (generalized) (M62.81)   Activity Tolerance Other (comment) (limited by reports of nausea)   Patient Left in chair;with call bell/phone within reach   Nurse Communication Mobility status        Time: 1478-2956 OT Time Calculation (min): 10 min  Charges: OT General Charges $OT Visit: 1 Visit OT Treatments $Self Care/Home Management : 8-22 mins    Frida Wahlstrom,  OTR/L 09/01/2023, 1:08 PM

## 2023-09-01 NOTE — Plan of Care (Signed)
  Problem: Education: Goal: Ability to describe self-care measures that may prevent or decrease complications (Diabetes Survival Skills Education) will improve Outcome: Progressing Goal: Individualized Educational Video(s) Outcome: Progressing   Problem: Coping: Goal: Ability to adjust to condition or change in health will improve Outcome: Progressing   Problem: Fluid Volume: Goal: Ability to maintain a balanced intake and output will improve Outcome: Progressing   Problem: Health Behavior/Discharge Planning: Goal: Ability to identify and utilize available resources and services will improve Outcome: Progressing Goal: Ability to manage health-related needs will improve Outcome: Progressing   Problem: Metabolic: Goal: Ability to maintain appropriate glucose levels will improve Outcome: Progressing   Problem: Nutritional: Goal: Maintenance of adequate nutrition will improve Outcome: Progressing Goal: Progress toward achieving an optimal weight will improve Outcome: Progressing   Problem: Skin Integrity: Goal: Risk for impaired skin integrity will decrease Outcome: Progressing   Problem: Tissue Perfusion: Goal: Adequacy of tissue perfusion will improve Outcome: Progressing   Problem: Education: Goal: Knowledge of General Education information will improve Description: Including pain rating scale, medication(s)/side effects and non-pharmacologic comfort measures Outcome: Progressing   Problem: Health Behavior/Discharge Planning: Goal: Ability to manage health-related needs will improve Outcome: Progressing   Problem: Clinical Measurements: Goal: Ability to maintain clinical measurements within normal limits will improve Outcome: Progressing Goal: Will remain free from infection Outcome: Progressing Goal: Diagnostic test results will improve Outcome: Progressing Goal: Respiratory complications will improve Outcome: Progressing Goal: Cardiovascular complication will  be avoided Outcome: Progressing   Problem: Activity: Goal: Risk for activity intolerance will decrease Outcome: Progressing   Problem: Nutrition: Goal: Adequate nutrition will be maintained Outcome: Progressing   Problem: Coping: Goal: Level of anxiety will decrease Outcome: Progressing   Problem: Elimination: Goal: Will not experience complications related to bowel motility Outcome: Progressing Goal: Will not experience complications related to urinary retention Outcome: Progressing   Problem: Pain Managment: Goal: General experience of comfort will improve and/or be controlled Outcome: Progressing   Problem: Safety: Goal: Ability to remain free from injury will improve Outcome: Progressing   Problem: Skin Integrity: Goal: Risk for impaired skin integrity will decrease Outcome: Progressing   Problem: Education: Goal: Ability to demonstrate management of disease process will improve Outcome: Progressing Goal: Ability to verbalize understanding of medication therapies will improve Outcome: Progressing Goal: Individualized Educational Video(s) Outcome: Progressing   Problem: Activity: Goal: Capacity to carry out activities will improve Outcome: Progressing   Problem: Cardiac: Goal: Ability to achieve and maintain adequate cardiopulmonary perfusion will improve Outcome: Progressing   Problem: Education: Goal: Ability to demonstrate management of disease process will improve Outcome: Progressing Goal: Ability to verbalize understanding of medication therapies will improve Outcome: Progressing Goal: Individualized Educational Video(s) Outcome: Progressing   Problem: Activity: Goal: Capacity to carry out activities will improve Outcome: Progressing   Problem: Cardiac: Goal: Ability to achieve and maintain adequate cardiopulmonary perfusion will improve Outcome: Progressing

## 2023-09-01 NOTE — Assessment & Plan Note (Addendum)
His glucose was controlled in the hospital with basal insulin and insulin sliding scale.  He had episodic hyperglycemia up to the 200   At the time of his discharge he will resume his usual insulin regimen with close follow up of capillary glucose.

## 2023-09-01 NOTE — Progress Notes (Signed)
Mobility Specialist Progress Note;   09/01/23 1419  Mobility  Activity Refused mobility   Pt refusing mobility at this time d/t feeling nauseas and lightheaded. Deferred any OOB mobility even in room. Stated he promises to walk tomorrow. Pt left comfortably in chair w/ all needs met.   Caesar Bookman Mobility Specialist Please contact via SecureChat or [employer] [phone]

## 2023-09-01 NOTE — Assessment & Plan Note (Addendum)
Iron deficiency anemia.  Cell count as been stable  Serum iron 19, TIBC 265, transferrin saturation 7 and ferritin 179   SP IV iron (iron sucrose 200 mg x 5 doses) Hold on oral iron therapy and follow up iron stores in 2 to 3 weeks as outpatient,.

## 2023-09-01 NOTE — Hospital Course (Addendum)
Peter Terry was admitted to the hospital with the working diagnosis of heart failure decompensation in the setting of advance renal failure.   67/M w chronic diastolic CHF, COPD, hypertension, hyperlipidemia, diabetes, CKD 4, GERD, obesity, OSA presented to the ED with progressive dyspnea. Recent outpatient follow up with cardiology 1/13 found volume overloaded and recommended continue oral diuretic therapy with bumetanide and metolazone. At home he continue to have dyspnea and generalized weakness prompting him to come to the ED. On his initial physical examination his blood pressure was 156/87, HR 79, RR 16 and 02 saturation 95%, lungs with decreased breath sounds bilaterally, with bilateral rales, heart with S1 and S2 present and regular with no gallops, rubs or murmurs, positive lower extremity edema.   NA 137 K 3,8 Cl 104, bicarbonate 23, glucose 288, BUN 64 and cr 3,96  Mg 1,7  BNP 106.2  High sensitive troponin 46 and 51  Wbc 18,9 hgb 9,2 plt 214   Sars covid 19 negative  Influenza negative Urine analysis SG 1,015, protein > 300, leukocytes negative and negative hgb. Glucose 100   Chest radiograph with cardiomegaly, bilateral hilar vascular congestion, bilateral central interstitial infiltrates. No effusions or infiltrates.   EKG 67 bpm, normal axis normal intervals, sinus rhythm with no significant ST segment or T wave changes.   Patient was placed on IV loop diuretics for volume overload. Noted to have right lower extremity rash and placed on antibiotics therapy for cellulitis.   01/23 renal function has been more stable.  Volume status has improved.  Completed antibiotic therapy.   01/24 stable renal function, patient will continue loop and thiazide diuretic with close follow up as outpatient with primary nephrology.

## 2023-09-01 NOTE — Assessment & Plan Note (Addendum)
Hypokalemia.   Serum cr peaked to 5,0  At the time of his discharge his renal function is better with serum cr at 4,55 with K at 3,6 and serum bicarbonate at 22  Na 138, P 5.5  Ca 8,7 (albumin 2,1)  BUN 80   Continue diuresis with bumetanide and metolazone.  Add P binders  Follow up renal function and electrolytes as outpatient   Anemia of chronic renal disease with stable hgb.

## 2023-09-01 NOTE — Assessment & Plan Note (Signed)
-   Continue with PPI 

## 2023-09-01 NOTE — Assessment & Plan Note (Signed)
Cpap

## 2023-09-01 NOTE — Assessment & Plan Note (Addendum)
Echocardiogram with preserved LV systolic function with EF 55 to 60%, RV systolic function preserved,   Patient had aggressive diuresis with IV loop diuretic, negative volume status was achieved, - 11,429 ml, with significant improvement in his symptoms.   Plan to continue diuresis with oral bumetanide 2 mg bid and every other day metolazone.  Continue carvedilol and after load reduction with hydralazine and isosorbide.  Discontinue amlodipine and holding losartan due to acutely worsening GFR.

## 2023-09-01 NOTE — Progress Notes (Signed)
Nephrology Follow-Up Consult note   Assessment/Recommendations: Peter Terry is a  68 y.o. male with a past medical history significant for HTN, HLD, COPD, DM 2, GERD, obesity, CKD 4 who present w/ heart Failure exacerbation, CAD by AKI    AKI on CKD 4: Baseline creatinine around 3.5.  Appears massively volume overloaded on exam.  Responding to diuresis.  Creatinine had risen as expected with diuresis but he still has evidence of volume on board.  Decreased rate of diuresis to 80 mg of Lasix twice daily, urine output still very good with 2.6 L overnight and BUN/creatinine starting to improve.  Continue current diuretic regimen as tolerated.    -Diuretics as below -Continue to monitor daily Cr, Dose meds for GFR -Monitor Daily I/Os, Daily weight  -Maintain MAP>65 for optimal renal perfusion.  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  Continue Lasix 120 mg 3 times daily.  Continue spironolactone.  Started SGLT2 inhibitor that has been helped.  There is no rush to restart and can always restart as outpatient..  Metolazone if needed but still good UOP and renal function worsening so would not start for now.     Net -10 L during this hospitalization but unfortunately renal function continues to trend in the wrong direction.  Decreased Lasix from 120 mg Q 8 hours to 80 mg twice daily on 1/21.   Leukocytosis/right lower extremity wounds: Wound care involved.  Management per primary team.  On antibiotics   Hypertension: Blood pressure initially on the lower side.  Held amlodipine.  Continue other medications as prescribed   Hyperlipidemia: On atorvastatin   Uncontrolled Diabetes Mellitus Type 2 with Hyperglycemia: Management per primary    Hypokalemia: Improved.  Continue spironolactone   Anemia: Likely multifactorial.  Transfuse if needed.  Iron  deficient.  Providing IV iron    Ethelene Hal [facility] 09/01/2023 12:08 PM  ___________________________________________________________  CC: Shortness of breath  Interval History/Subjective: Patient feels about the same today, some nausea.  Continues to have lower extremity pain.  Shortness of breath improved he was able to do some walking yesterday. Urine output continues to be good.  Creatinine finally improving    Medications:  Current Facility-Administered Medications  Medication Dose Route Frequency Provider Last Rate Last Admin   acetaminophen (TYLENOL) tablet 650 mg  650 mg Oral Q6H PRN Synetta Fail, MD   650 mg at 08/31/23 2144   Or   acetaminophen (TYLENOL) suppository 650 mg  650 mg Rectal Q6H PRN Synetta Fail, MD       albuterol (PROVENTIL) (2.5 MG/3ML) 0.083% nebulizer solution 2.5 mg  2.5 mg Inhalation Q4H PRN Synetta Fail, MD       atorvastatin (LIPITOR) tablet 80 mg  80 mg Oral QHS Synetta Fail, MD   80 mg at 08/31/23 2144   carvedilol (COREG) tablet 25 mg  25 mg Oral BID Synetta Fail, MD   25 mg at 09/01/23 0751   cephALEXin (KEFLEX) capsule 250 mg  250 mg Oral Q12H Zannie Cove, MD   250 mg at 09/01/23 0751   enoxaparin (LOVENOX) injection 30 mg  30 mg Subcutaneous QHS Silvana Newness, RPH   30 mg at 08/31/23 2143   ferrous sulfate tablet 325 mg  325 mg Oral TID WC Synetta Fail, MD   325 mg at 09/01/23 1051  furosemide (LASIX) injection 80 mg  80 mg Intravenous BID Ethelene Hal, MD   80 mg at 09/01/23 0751   hydrALAZINE (APRESOLINE) tablet 75 mg  75 mg Oral TID Zannie Cove, MD   75 mg at 09/01/23 0750   insulin aspart (novoLOG) injection 0-15 Units  0-15 Units Subcutaneous TID [facility] Synetta Fail, MD   2 Units at 09/01/23 5409   insulin aspart (novoLOG) injection 0-5 Units  0-5 Units Subcutaneous QHS Synetta Fail, MD   2 Units at 08/31/23 2152   insulin glargine-yfgn [facility]) injection 30  Units  30 Units Subcutaneous BID Zannie Cove, MD   30 Units at 09/01/23 0750   isosorbide mononitrate (IMDUR) 24 hr tablet 30 mg  30 mg Oral Daily Zannie Cove, MD   30 mg at 09/01/23 0751   ondansetron [facility]) injection 4 mg  4 mg Intravenous Q6H PRN Zannie Cove, MD   4 mg at 09/01/23 0841   pantoprazole (PROTONIX) EC tablet 40 mg  40 mg Oral Daily Synetta Fail, MD   40 mg at 09/01/23 0751   polyethylene glycol (MIRALAX / GLYCOLAX) packet 17 g  17 g Oral Daily PRN Synetta Fail, MD       sodium chloride flush (NS) 0.9 % injection 3 mL  3 mL Intravenous Q12H Synetta Fail, MD   3 mL at 09/01/23 0753   traMADol (ULTRAM) tablet 50 mg  50 mg Oral Q8H PRN Synetta Fail, MD   50 mg at 08/31/23 2144      Review of Systems: 10 systems reviewed and negative except per interval history/subjective  Physical Exam: Vitals:   09/01/23 0508 09/01/23 0740  BP: 137/76 139/72  Pulse: 77 83  Resp: 16 14  Temp: 98.3 F (36.8 C) 98.4 F (36.9 C)  SpO2: 90% 91%   Total I/O In: 228 [P.O.:118; IV Piggyback:110] Out: 300 [Urine:300]  Intake/Output Summary (Last 24 hours) at 09/01/2023 1208 Last data filed at 09/01/2023 1106 Gross per 24 hour  Intake 1428 ml  Output 2300 ml  Net -872 ml   Constitutional: Obese, sitting, no distress ENMT: ears and nose without scars or lesions, MMM CV: normal rate, 1+ pitting edema in the bilateral lower extremities Respiratory: Bilateral chest rise with no increased work of breathing Gastrointestinal: soft, non-tender, moderate distention Skin: wound on right lower extremity wrapped, otherwise no visible lesions or rashes Psych: alert, judgement/insight appropriate, appropriate mood and affect   Test Results I personally reviewed new and old clinical labs and radiology tests Lab Results  Component Value Date   NA 138 09/01/2023   K 3.7 09/01/2023   CL 100 09/01/2023   CO2 25 09/01/2023   BUN 79 (H) 09/01/2023    CREATININE 4.60 (H) 09/01/2023   GFR 13.47 (LL) 05/12/2023   CALCIUM 8.4 (L) 09/01/2023   ALBUMIN 2.1 (L) 08/28/2023    CBC Recent Labs  Lab 08/25/23 2021 08/26/23 1553 08/28/23 0339 08/29/23 0409 08/31/23 0346  WBC 18.9*   < > 8.3 10.0 12.5*  NEUTROABS 17.3*  --   --   --   --   HGB 9.2*   < > 8.2* 9.1* 8.9*  HCT 28.5*   < > 25.4* 27.8* 27.9*  MCV 75.4*   < > 74.7* 74.1* 75.2*  PLT 214   < > 196 240 260   < > = values in this interval not displayed.

## 2023-09-01 NOTE — Assessment & Plan Note (Signed)
Calculated BMI is 45.3

## 2023-09-01 NOTE — Assessment & Plan Note (Signed)
Old records personally reviewed, neurology follow up from 05/2021. Seropositive ocular myasthenia gravis, no significant bulbar, or limb weakness noted.  He is not longer on Mestinon or prednisone, never treated with long term steroids sparing agent.   

## 2023-09-01 NOTE — Assessment & Plan Note (Addendum)
Patient completed antibiotic therapy during his hospitalization.  Skin infection was present on admission, no sepsis.   Continue local skin care.

## 2023-09-01 NOTE — Assessment & Plan Note (Addendum)
Continue blood pressure control with carvedilol, hydralazine and isosorbide.  Discontinue amlodipine and on hold losartan.

## 2023-09-01 NOTE — Progress Notes (Signed)
  Progress Note   Patient: Peter Terry YQM:[id number] DOB: [birth year] DOA: 08/25/2023     6 DOS: the patient was seen and examined on 09/01/2023   Brief hospital course: 67/M w chronic diastolic CHF, COPD, hypertension, hyperlipidemia, diabetes, CKD 4, GERD, obesity, OSA presented to the ED with progressive dyspnea, followed by cardiology and nephrology, recently maintained on Bumex with metolazone every other day -Also has chronic wounds for over a year, worse in the right lower extremity -In the ED tachycardic, BUN 64, creatinine 4, WBC of 18.9, hemoglobin 9.2, troponin 46, 51, chest x-ray with cardiomegaly and pulmonary vascular congestion -Nephrology consulting, started on diuretics, complicated by worsening AKI, volume status improving -Also on antibiotics for right lower extremity cellulitis  Assessment and Plan: * Acute on chronic diastolic CHF (congestive heart failure) (HCC) Echocardiogram with preserved LV systolic function with EF 55 to 60%, RV systolic function preserved,   Urine output is 2,600 cc  Systolic blood pressure is 130s mmHg.   Plan to reduce dose of furosemide to 80 mg IV bid.  Continue carvedilol and after load reduction with hydralazine and isosorbide.   Cellulitis of right lower extremity Continue cephalexin Plan to remove dressing tomorrow to inspect wound status and plan duration of antibiotics.   Acute renal failure superimposed on stage 4 chronic kidney disease (HCC) Hypokalemia.   Renal function with serum cr at 4,6 with K at 3.7 and serum bicarbonate at 25  Na 138 and BUN 79   Plan to continue diuresis and follow up renal function in am.   Anemia of chronic renal disease with stable hgb.   Essential hypertension Continue blood pressure control with hydralazine and isosorbide.   COPD (chronic obstructive pulmonary disease) (HCC) No signs of acute exacerbation   GERD (gastroesophageal reflux disease) Continue with PPI  Obstructive sleep  apnea Cpap  Type 2 diabetes mellitus with hyperlipidemia (HCC) Continue glucose cover and monitoring with insulin sliding scale.  Fasting glucose this am 148   Anemia of chronic disease Iron deficiency anemia.  Cell count as been stable  SP IV iron (iron sucrose 200 mg x 5 doses.   Obesity, class 3 Calculated BMI is 45.3        Subjective: Patient with improvement in edema and dyspnea, no chest pain   Physical Exam: Vitals:   09/01/23 0508 09/01/23 0740 09/01/23 1504 09/01/23 1600  BP: 137/76 139/72 112/74   Pulse: 77 83 74   Resp: 16 14 15 13   Temp: 98.3 F (36.8 C) 98.4 F (36.9 C) 97.7 F (36.5 C)   TempSrc: Oral Oral Oral   SpO2: 90% 91% 92%   Weight: (!) 139.2 kg     Height:       Neurology awake and alert ENT with mild pallor Cardiovascular with S1 and S2 present and regular with no gallops, rubs or murmurs Respiratory with no rales or wheezing Abdomen with no distention  Right lower extremity with dressing in place, left with improvement in edema. Today likely only +  Data Reviewed:    Family Communication: no family at the bedside   Disposition: Status is: Inpatient Remains inpatient appropriate because: recovering renal function   Planned Discharge Destination: Home      Author: Coralie Keens, MD 09/01/2023 6:10 PM  For on call review [email or web address].

## 2023-09-02 DIAGNOSIS — I5033 Acute on chronic diastolic (congestive) heart failure: Secondary | ICD-10-CM | POA: Diagnosis not present

## 2023-09-02 DIAGNOSIS — L03115 Cellulitis of right lower limb: Secondary | ICD-10-CM | POA: Diagnosis not present

## 2023-09-02 DIAGNOSIS — N179 Acute kidney failure, unspecified: Secondary | ICD-10-CM | POA: Diagnosis not present

## 2023-09-02 DIAGNOSIS — I10 Essential (primary) hypertension: Secondary | ICD-10-CM | POA: Diagnosis not present

## 2023-09-02 LAB — BASIC METABOLIC PANEL
Anion gap: 14 (ref 5–15)
BUN: 82 mg/dL — ABNORMAL HIGH (ref 8–23)
CO2: 23 mmol/L (ref 22–32)
Calcium: 8.1 mg/dL — ABNORMAL LOW (ref 8.9–10.3)
Chloride: 94 mmol/L — ABNORMAL LOW (ref 98–111)
Creatinine, Ser: 4.8 mg/dL — ABNORMAL HIGH (ref 0.61–1.24)
GFR, Estimated: 13 mL/min — ABNORMAL LOW (ref >60)
Glucose, Bld: 181 mg/dL — ABNORMAL HIGH (ref 70–99)
Potassium: 3.7 mmol/L (ref 3.5–5.1)
Sodium: 135 mmol/L (ref 135–145)

## 2023-09-02 LAB — GLUCOSE, CAPILLARY
Glucose-Capillary: 158 mg/dL — ABNORMAL HIGH (ref 70–99)
Glucose-Capillary: 252 mg/dL — ABNORMAL HIGH (ref 70–99)
Glucose-Capillary: 221 mg/dL — ABNORMAL HIGH (ref 70–99)
Glucose-Capillary: 238 mg/dL — ABNORMAL HIGH (ref 70–99)

## 2023-09-02 MED ORDER — BUMETANIDE 2 MG PO TABS
2.0000 mg | ORAL_TABLET | Freq: Two times a day (BID) | ORAL | Status: DC
  Administered 2023-09-03: 2 mg via ORAL
  Filled 2023-09-02: qty 1

## 2023-09-02 MED ORDER — BUMETANIDE NICU ORAL SYRINGE 0.25 MG/ML: 2.0000 mg | Freq: Two times a day (BID) | ORAL | Status: DC

## 2023-09-02 MED ORDER — METOLAZONE 5 MG PO TABS
2.5000 mg | ORAL_TABLET | ORAL | Status: DC
  Administered 2023-09-02: 2.5 mg via ORAL
  Filled 2023-09-02: qty 1

## 2023-09-02 NOTE — Plan of Care (Signed)
  Problem: Education: Goal: Ability to describe self-care measures that may prevent or decrease complications (Diabetes Survival Skills Education) will improve Outcome: Progressing Goal: Individualized Educational Video(s) Outcome: Progressing   Problem: Coping: Goal: Ability to adjust to condition or change in health will improve Outcome: Progressing   Problem: Fluid Volume: Goal: Ability to maintain a balanced intake and output will improve Outcome: Progressing   Problem: Health Behavior/Discharge Planning: Goal: Ability to identify and utilize available resources and services will improve Outcome: Progressing Goal: Ability to manage health-related needs will improve Outcome: Progressing   Problem: Nutritional: Goal: Maintenance of adequate nutrition will improve Outcome: Progressing  Problem: Skin Integrity: Goal: Risk for impaired skin integrity will decrease Outcome: Progressing   Problem: Clinical Measurements: Goal: Ability to maintain clinical measurements within normal limits will improve Outcome: Progressing Goal: Will remain free from infection Outcome: Progressing Goal: Diagnostic test results will improve Outcome: Progressing Goal: Respiratory complications will improve Outcome: Progressing Goal: Cardiovascular complication will be avoided Outcome: Progressing   Problem: Elimination: Goal: Will not experience complications related to bowel motility Outcome: Progressing Goal: Will not experience complications related to urinary retention Outcome: Progressing   Problem: Education: Goal: Ability to demonstrate management of disease process will improve Outcome: Progressing Goal: Ability to verbalize understanding of medication therapies will improve Outcome: Progressing Goal: Individualized Educational Video(s) Outcome: Progressing   Problem: Activity: Goal: Capacity to carry out activities will improve Outcome: Progressing   Problem:  Cardiac: Goal: Ability to achieve and maintain adequate cardiopulmonary perfusion will improve Outcome: Progressing   Problem: Education: Goal: Ability to demonstrate management of disease process will improve Outcome: Progressing Goal: Ability to verbalize understanding of medication therapies will improve Outcome: Progressing Goal: Individualized Educational Video(s) Outcome: Progressing

## 2023-09-02 NOTE — Progress Notes (Signed)
Nephrology Follow-Up Consult note   Assessment/Recommendations: Peter Terry is a  68 y.o. male with a past medical history significant for HTN, HLD, COPD, DM 2, GERD, obesity, CKD 4 who present w/ heart Failure exacerbation, CAD by AKI    AKI on CKD 4: Baseline creatinine around 3.5.  Appears massively volume overloaded on exam.  Responding to diuresis.  Creatinine had risen as expected with diuresis but he still has evidence of volume on board.  Decreased rate of diuresis to 80 mg of Lasix twice daily, urine output still very good with 2.6 L overnight and BUN/creatinine starting to improve.  Continue current diuretic regimen as tolerated.    -Diuretics as below -Continue to monitor daily Cr, Dose meds for GFR -Monitor Daily I/Os, Daily weight  -Maintain MAP>65 for optimal renal perfusion.  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  Initially on Lasix 120 mg 3 times daily.  Continue spironolactone.  Metolazone if needed but still good UOP and renal function worsening so would not start for now.     Net -11.2 L during this hospitalization but unfortunately renal function continues to trend in the wrong direction.  Decreased Lasix from 120 mg Q 8 hours to 80 mg twice daily on 1/21.  Would just go to Bumex 2mg  twice daily and Metolazone 2.5mg  every other day; his home regimen was Bumex 2 mg once daily and metolazone 2.5 mg every other day.  Followed by [facility] nephrology.  I advised him that he needs to make an appointment to see his primary nephrologist in 2 to 4 weeks.  Also educated him on weighing himself each morning and recording, adjusting medications based on readings.  He will need more education.   Leukocytosis/right lower extremity wounds: Wound care involved.  Management per primary team.  On antibiotics   Hypertension: Blood pressure  initially on the lower side.  Held amlodipine.  Continue other medications as prescribed   Hyperlipidemia: On atorvastatin   Uncontrolled Diabetes Mellitus Type 2 with Hyperglycemia: Management per primary    Hypokalemia: Improved.  Continue spironolactone   Anemia: Likely multifactorial.  Transfuse if needed.  Iron deficient.  Providing IV iron    Ethelene Hal [facility] 09/02/2023 10:28 AM  ___________________________________________________________  CC: Shortness of breath  Interval History/Subjective: Patient feels about the same today, nausea better.  Continues to have lower extremity pain.  Shortness of breath much improved; able to do some walking. Urine output continues to be good.  Creatinine fluctuating.   Medications:  Current Facility-Administered Medications  Medication Dose Route Frequency Provider Last Rate Last Admin   acetaminophen (TYLENOL) tablet 650 mg  650 mg Oral Q6H PRN Synetta Fail, MD   650 mg at 08/31/23 2144   Or   acetaminophen (TYLENOL) suppository 650 mg  650 mg Rectal Q6H PRN Synetta Fail, MD       albuterol (PROVENTIL) (2.5 MG/3ML) 0.083% nebulizer solution 2.5 mg  2.5 mg Inhalation Q4H PRN Synetta Fail, MD       atorvastatin (LIPITOR) tablet 80 mg  80 mg Oral QHS Synetta Fail, MD   80 mg at 09/01/23 2118   carvedilol (COREG) tablet 25 mg  25 mg Oral BID Synetta Fail, MD   25 mg at 09/02/23 0759   enoxaparin (LOVENOX) injection 30 mg  30 mg Subcutaneous QHS Silvana Newness, [facility]  30 mg at 09/01/23 2116   ferrous sulfate tablet 325 mg  325 mg Oral TID WC Synetta Fail, MD   325 mg at 09/02/23 0759   furosemide (LASIX) injection 80 mg  80 mg Intravenous BID Ethelene Hal, MD   80 mg at 09/02/23 1610   hydrALAZINE (APRESOLINE) tablet 75 mg  75 mg Oral TID Zannie Cove, MD   75 mg at 09/02/23 1017   insulin aspart (novoLOG) injection 0-15 Units  0-15 Units Subcutaneous TID WC Synetta Fail, MD   2 Units at 09/02/23 9604   insulin aspart (novoLOG) injection 0-5 Units  0-5 Units Subcutaneous QHS Synetta Fail, MD   2 Units at 08/31/23 2152   insulin glargine-yfgn [facility]) injection 30 Units  30 Units Subcutaneous BID Zannie Cove, MD   30 Units at 09/02/23 1017   isosorbide mononitrate (IMDUR) 24 hr tablet 30 mg  30 mg Oral Daily Zannie Cove, MD   30 mg at 09/02/23 1017   ondansetron [facility]) injection 4 mg  4 mg Intravenous Q6H PRN Zannie Cove, MD   4 mg at 09/01/23 0841   pantoprazole (PROTONIX) EC tablet 40 mg  40 mg Oral Daily Synetta Fail, MD   40 mg at 09/02/23 1017   polyethylene glycol (MIRALAX / GLYCOLAX) packet 17 g  17 g Oral Daily PRN Synetta Fail, MD       sodium chloride flush (NS) 0.9 % injection 3 mL  3 mL Intravenous Q12H Synetta Fail, MD   3 mL at 09/02/23 1019   traMADol (ULTRAM) tablet 50 mg  50 mg Oral Q8H PRN Synetta Fail, MD   50 mg at 09/01/23 2117      Review of Systems: 10 systems reviewed and negative except per interval history/subjective  Physical Exam: Vitals:   09/02/23 0347 09/02/23 0802  BP: 132/70 128/72  Pulse: 78 76  Resp: 16 16  Temp: 98.2 F (36.8 C) (!) 97.4 F (36.3 C)  SpO2: 93% 96%   Total I/O In: -  Out: 550 [Urine:550]  Intake/Output Summary (Last 24 hours) at 09/02/2023 1028 Last data filed at 09/02/2023 1002 Gross per 24 hour  Intake 470 ml  Output 1600 ml  Net -1130 ml   Constitutional: Obese, sitting, no distress ENMT: ears and nose without scars or lesions, MMM CV: normal rate, 1+ pitting edema in the bilateral lower extremities Respiratory: Bilateral chest rise with no increased work of breathing Gastrointestinal: soft, non-tender, moderate distention Skin: wound on right lower extremity wrapped, otherwise no visible lesions or rashes Psych: alert, judgement/insight appropriate, appropriate mood and affect   Test Results I personally reviewed new and old clinical  labs and radiology tests Lab Results  Component Value Date   NA 135 09/02/2023   K 3.7 09/02/2023   CL 94 (L) 09/02/2023   CO2 23 09/02/2023   BUN 82 (H) 09/02/2023   CREATININE 4.80 (H) 09/02/2023   GFR 13.47 (LL) 05/12/2023   CALCIUM 8.1 (L) 09/02/2023   ALBUMIN 2.1 (L) 08/28/2023    CBC Recent Labs  Lab 08/28/23 0339 08/29/23 0409 08/31/23 0346  WBC 8.3 10.0 12.5*  HGB 8.2* 9.1* 8.9*  HCT 25.4* 27.8* 27.9*  MCV 74.7* 74.1* 75.2*  PLT 196 240 260

## 2023-09-02 NOTE — Progress Notes (Signed)
Mobility Specialist Progress Note;   09/02/23 1046  Mobility  Activity Ambulated with assistance in hallway  Level of Assistance Contact guard assist, steadying assist  Assistive Device None  Distance Ambulated (ft) 225 ft  Activity Response Tolerated well  Mobility Referral Yes  Mobility visit 1 Mobility  Mobility Specialist Start Time (ACUTE ONLY) 1046  Mobility Specialist Stop Time (ACUTE ONLY) 1100  Mobility Specialist Time Calculation (min) (ACUTE ONLY) 14 min   Pt eager for mobility. Required MinG assistance throughout ambulation for safety. VSS throughout. Took 3x standing rest break d/t fatigue. Otherwise, no c/o. Pt returned back to chair with all needs met.   Caesar Bookman Mobility Specialist Please contact via SecureChat or [employer] [phone]

## 2023-09-02 NOTE — Progress Notes (Addendum)
Progress Note   Patient: Peter Terry:[id number] DOB: 30-Aug-1955 DOA: 08/25/2023     7 DOS: the patient was seen and examined on 09/02/2023   Brief hospital course: 67/M w chronic diastolic CHF, COPD, hypertension, hyperlipidemia, diabetes, CKD 4, GERD, obesity, OSA presented to the ED with progressive dyspnea, followed by cardiology and nephrology, recently maintained on Bumex with metolazone every other day -Also has chronic wounds for over a year, worse in the right lower extremity -In the ED tachycardic, BUN 64, creatinine 4, WBC of 18.9, hemoglobin 9.2, troponin 46, 51, chest x-ray with cardiomegaly and pulmonary vascular congestion -Nephrology consulting, started on diuretics, complicated by worsening AKI, volume status improving -Also on antibiotics for right lower extremity cellulitis  01/23 renal function has been more stable.  Volume status has improved.  Completed antibiotic therapy.   Assessment and Plan: * Acute on chronic diastolic CHF (congestive heart failure) (HCC) Echocardiogram with preserved LV systolic function with EF 55 to 60%, RV systolic function preserved,   Urine output is 1,350 cc  Systolic blood pressure is 120 mmHg.   Transition to oral bumetanide and every other day metolazone  Continue carvedilol and after load reduction with hydralazine and isosorbide.   Ok to discontinue telemetry, change level of care to med surg   Cellulitis of right lower extremity Rash has improved, today resolved.  Discontinue antibiotic therapy.  Continue local skin care.   Acute renal failure superimposed on stage 4 chronic kidney disease (HCC) Hypokalemia.   Improved volume status, renal function today with serum cr at 4,8 with K at 3.7 and serum bicarbonate at 23 Na 135   Plan to continue diuresis and follow up renal function in am.  If renal function stable possible discharge home tomorrow   Anemia of chronic renal disease with stable hgb.   Essential  hypertension Continue blood pressure control with hydralazine and isosorbide.   COPD (chronic obstructive pulmonary disease) (HCC) No signs of acute exacerbation   GERD (gastroesophageal reflux disease) Continue with PPI  Obstructive sleep apnea Cpap  Type 2 diabetes mellitus with hyperlipidemia (HCC) Continue glucose cover and monitoring with insulin sliding scale.  On basal insulin 30 units bid.  Fasting glucose this am 181  Anemia of chronic disease Iron deficiency anemia.  Cell count as been stable  SP IV iron (iron sucrose 200 mg x 5 doses.   Obesity, class 3 Calculated BMI is 45.3        Subjective: Patient is feeling better, his dyspnea and edema continue to improve, no right leg pain   Physical Exam: Vitals:   09/02/23 0800 09/02/23 0802 09/02/23 0900 09/02/23 1408  BP: 128/72 128/72  (!) 101/51  Pulse: 76 76  74  Resp: 14 16  19   Temp:  (!) 97.4 F (36.3 C)  98.1 F (36.7 C)  TempSrc:  Oral  Oral  SpO2: 98% 96%    Weight:   (!) 139.2 kg   Height:       Neurology awake and alert ENT with mild pallor Cardiovascular with S1 and S2 present and regular with no gallops, rubs or murmurs Respiratory with no raled or wheezing, no rhonchi Abdomen with no distention  Lower extremity with + non pitting edema, some lymphedema  Right lower extremity rash has resolved, no purulence, no increased local temperature       Data Reviewed:    Family Communication: no family at the bedside   Disposition: Status is: Inpatient Remains inpatient appropriate because: follow  up renal function   Planned Discharge Destination: Home      Author: Coralie Keens, MD 09/02/2023 3:25 PM  For on call review [email or web address].

## 2023-09-03 ENCOUNTER — Other Ambulatory Visit (HOSPITAL_COMMUNITY): Payer: Self-pay

## 2023-09-03 DIAGNOSIS — E1122 Type 2 diabetes mellitus with diabetic chronic kidney disease: Secondary | ICD-10-CM | POA: Diagnosis not present

## 2023-09-03 DIAGNOSIS — L03115 Cellulitis of right lower limb: Secondary | ICD-10-CM | POA: Diagnosis not present

## 2023-09-03 DIAGNOSIS — I5033 Acute on chronic diastolic (congestive) heart failure: Secondary | ICD-10-CM | POA: Diagnosis not present

## 2023-09-03 DIAGNOSIS — N179 Acute kidney failure, unspecified: Secondary | ICD-10-CM | POA: Diagnosis not present

## 2023-09-03 DIAGNOSIS — I10 Essential (primary) hypertension: Secondary | ICD-10-CM | POA: Diagnosis not present

## 2023-09-03 LAB — RENAL FUNCTION PANEL
Albumin: 2.6 g/dL — ABNORMAL LOW (ref 3.5–5.0)
Anion gap: 15 (ref 5–15)
BUN: 80 mg/dL — ABNORMAL HIGH (ref 8–23)
CO2: 22 mmol/L (ref 22–32)
Calcium: 8.7 mg/dL — ABNORMAL LOW (ref 8.9–10.3)
Chloride: 101 mmol/L (ref 98–111)
Creatinine, Ser: 4.55 mg/dL — ABNORMAL HIGH (ref 0.61–1.24)
GFR, Estimated: 14 mL/min — ABNORMAL LOW (ref >60)
Glucose, Bld: 209 mg/dL — ABNORMAL HIGH (ref 70–99)
Phosphorus: 5.5 mg/dL — ABNORMAL HIGH (ref 2.5–4.6)
Potassium: 3.6 mmol/L (ref 3.5–5.1)
Sodium: 138 mmol/L (ref 135–145)

## 2023-09-03 LAB — GLUCOSE, CAPILLARY: Glucose-Capillary: 206 mg/dL — ABNORMAL HIGH (ref 70–99)

## 2023-09-03 MED ORDER — BUMETANIDE 1 MG PO TABS
2.0000 mg | ORAL_TABLET | Freq: Two times a day (BID) | ORAL | 0 refills | Status: DC
  Filled 2023-09-03: qty 104, 26d supply, fill #0

## 2023-09-03 MED ORDER — HYDRALAZINE HCL 25 MG PO TABS
75.0000 mg | ORAL_TABLET | Freq: Three times a day (TID) | ORAL | 0 refills | Status: DC
  Filled 2023-09-03: qty 270, 30d supply, fill #0

## 2023-09-03 MED ORDER — CALCIUM ACETATE (PHOS BINDER) 667 MG PO CAPS
667.0000 mg | ORAL_CAPSULE | Freq: Three times a day (TID) | ORAL | 0 refills | Status: DC
  Filled 2023-09-03: qty 90, 30d supply, fill #0

## 2023-09-03 MED ORDER — BISACODYL 5 MG PO TBEC: 5.0000 mg | DELAYED_RELEASE_TABLET | Freq: Once | ORAL | Status: DC

## 2023-09-03 MED ORDER — ISOSORBIDE MONONITRATE ER 30 MG PO TB24
30.0000 mg | ORAL_TABLET | Freq: Every day | ORAL | 0 refills | Status: DC
  Filled 2023-09-03: qty 30, 30d supply, fill #0

## 2023-09-03 MED ORDER — CALCIUM ACETATE (PHOS BINDER) 667 MG PO CAPS: 667.0000 mg | ORAL_CAPSULE | Freq: Three times a day (TID) | ORAL | Status: DC

## 2023-09-03 NOTE — Plan of Care (Signed)
  Problem: Education: Goal: Ability to describe self-care measures that may prevent or decrease complications (Diabetes Survival Skills Education) will improve Outcome: Adequate for Discharge   Problem: Coping: Goal: Ability to adjust to condition or change in health will improve Outcome: Adequate for Discharge   Problem: Fluid Volume: Goal: Ability to maintain a balanced intake and output will improve Outcome: Adequate for Discharge   Problem: Health Behavior/Discharge Planning: Goal: Ability to identify and utilize available resources and services will improve Outcome: Adequate for Discharge Goal: Ability to manage health-related needs will improve Outcome: Adequate for Discharge   Problem: Metabolic: Goal: Ability to maintain appropriate glucose levels will improve Outcome: Adequate for Discharge   Problem: Nutritional: Goal: Maintenance of adequate nutrition will improve Outcome: Adequate for Discharge Goal: Progress toward achieving an optimal weight will improve Outcome: Adequate for Discharge   Problem: Tissue Perfusion: Goal: Adequacy of tissue perfusion will improve Outcome: Adequate for Discharge   Problem: Clinical Measurements: Goal: Ability to maintain clinical measurements within normal limits will improve Outcome: Adequate for Discharge Goal: Will remain free from infection Outcome: Adequate for Discharge Goal: Diagnostic test results will improve Outcome: Adequate for Discharge Goal: Respiratory complications will improve Outcome: Adequate for Discharge Goal: Cardiovascular complication will be avoided Outcome: Adequate for Discharge   Problem: Activity: Goal: Risk for activity intolerance will decrease Outcome: Adequate for Discharge   Problem: Elimination: Goal: Will not experience complications related to bowel motility Outcome: Adequate for Discharge Goal: Will not experience complications related to urinary retention Outcome: Adequate for  Discharge   Problem: Pain Managment: Goal: General experience of comfort will improve and/or be controlled Outcome: Adequate for Discharge   Problem: Safety: Goal: Ability to remain free from injury will improve Outcome: Adequate for Discharge   Problem: Education: Goal: Ability to demonstrate management of disease process will improve Outcome: Adequate for Discharge Goal: Ability to verbalize understanding of medication therapies will improve Outcome: Adequate for Discharge Goal: Individualized Educational Video(s) Outcome: Adequate for Discharge   Problem: Cardiac: Goal: Ability to achieve and maintain adequate cardiopulmonary perfusion will improve Outcome: Adequate for Discharge   Problem: Activity: Goal: Capacity to carry out activities will improve Outcome: Adequate for Discharge

## 2023-09-03 NOTE — Care Management Important Message (Signed)
Important Message  Patient Details  Name: Peter Terry MRN: [id number] Date of Birth: [birth year]   Important Message Given:  Yes - Medicare IM     Renie Ora 09/03/2023, 10:50 AM

## 2023-09-03 NOTE — Progress Notes (Addendum)
Nephrology Follow-Up Consult note   Assessment/Recommendations: Peter Terry is a  68 y.o. male with a past medical history significant for HTN, HLD, COPD, DM 2, GERD, obesity, CKD 4 who present w/ heart Failure exacerbation, CAD by AKI    AKI on CKD 4: Baseline creatinine around 3.5.  Appears massively volume overloaded on exam.  Responding to diuresis.  Creatinine had risen as expected with diuresis but he still has evidence of volume on board.    Net -11.2 L during this hospitalization but unfortunately renal function continues to trend in the wrong direction.  Decreased Lasix from 120 mg Q 8 hours to 80 mg twice daily on 1/21 -> Bumex 2mg  twice daily and Metolazone 2.5mg  every other day; his home regimen was Bumex 2 mg once daily and metolazone 2.5 mg every other day.  Followed by [facility] nephrology.  I advised him that he needs to make an appointment to see his primary nephrologist in 2 to 4 weeks.  Also educated him on weighing himself each morning and recording, adjusting medications based on readings.  He will need more education.  Renal function appears to have stabilized. Signing off at this time; please reconsult as needed.  -Continue to monitor daily Cr, Dose meds for GFR -Monitor Daily I/Os, Daily weight  -Maintain MAP>65 for optimal renal perfusion.  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  Initially on Lasix 120 mg 3 times daily.  Continue spironolactone.  Metolazone if needed but still good UOP and renal function worsening so would not start for now.       Leukocytosis/right lower extremity wounds: Wound care involved.  Management per primary team.  On antibiotics   Hypertension: Blood pressure initially on the lower side.  Held amlodipine.  Continue other medications as prescribed   Hyperlipidemia: On atorvastatin    Uncontrolled Diabetes Mellitus Type 2 with Hyperglycemia: Management per primary    Hypokalemia: Improved.  Continue spironolactone   Anemia: Likely multifactorial.  Transfuse if needed.  Iron deficient.  Providing IV iron    Ethelene Hal [facility] 09/03/2023 9:41 AM  ___________________________________________________________  CC: Shortness of breath  Interval History/Subjective: Patient feels about the same today, nausea still present.  Shortness of breath much improved; able to do some walking. Urine output continues to be good. Constipated.  Creatinine better   Medications:  Current Facility-Administered Medications  Medication Dose Route Frequency Provider Last Rate Last Admin   acetaminophen (TYLENOL) tablet 650 mg  650 mg Oral Q6H PRN Synetta Fail, MD   650 mg at 08/31/23 2144   Or   acetaminophen (TYLENOL) suppository 650 mg  650 mg Rectal Q6H PRN Synetta Fail, MD       albuterol (PROVENTIL) (2.5 MG/3ML) 0.083% nebulizer solution 2.5 mg  2.5 mg Inhalation Q4H PRN Synetta Fail, MD       atorvastatin (LIPITOR) tablet 80 mg  80 mg Oral QHS Synetta Fail, MD   80 mg at 09/02/23 2130   bumetanide (BUMEX) tablet 2 mg  2 mg Oral BID Ethelene Hal, MD   2 mg at 09/03/23 0804   carvedilol (COREG) tablet 25 mg  25 mg Oral BID Synetta Fail, MD   25 mg at 09/03/23 0803   enoxaparin (LOVENOX) injection 30 mg  30 mg Subcutaneous QHS Silvana Newness, RPH   30 mg at  09/02/23 2129   ferrous sulfate tablet 325 mg  325 mg Oral TID WC Synetta Fail, MD   325 mg at 09/03/23 4098   hydrALAZINE (APRESOLINE) tablet 75 mg  75 mg Oral TID Zannie Cove, MD   75 mg at 09/03/23 0804   insulin aspart (novoLOG) injection 0-15 Units  0-15 Units Subcutaneous TID WC Synetta Fail, MD   5 Units at 09/03/23 1191   insulin aspart (novoLOG) injection 0-5 Units  0-5 Units Subcutaneous QHS Synetta Fail, MD   2 Units at 09/02/23 2129    insulin glargine-yfgn [facility]) injection 30 Units  30 Units Subcutaneous BID Zannie Cove, MD   30 Units at 09/03/23 0804   isosorbide mononitrate (IMDUR) 24 hr tablet 30 mg  30 mg Oral Daily Zannie Cove, MD   30 mg at 09/03/23 4782   metolazone (ZAROXOLYN) tablet 2.5 mg  2.5 mg Oral Susie Cassette, MD   2.5 mg at 09/02/23 1208   ondansetron [facility]) injection 4 mg  4 mg Intravenous Q6H PRN Zannie Cove, MD   4 mg at 09/03/23 0912   pantoprazole (PROTONIX) EC tablet 40 mg  40 mg Oral Daily Synetta Fail, MD   40 mg at 09/03/23 0804   polyethylene glycol (MIRALAX / GLYCOLAX) packet 17 g  17 g Oral Daily PRN Synetta Fail, MD       sodium chloride flush (NS) 0.9 % injection 3 mL  3 mL Intravenous Q12H Synetta Fail, MD   3 mL at 09/03/23 0805   traMADol (ULTRAM) tablet 50 mg  50 mg Oral Q8H PRN Synetta Fail, MD   50 mg at 09/01/23 2117      Review of Systems: 10 systems reviewed and negative except per interval history/subjective  Physical Exam: Vitals:   09/03/23 0404 09/03/23 0800  BP: (!) 155/77 132/72  Pulse: 93 86  Resp: 18 16  Temp: 98.7 F (37.1 C) 98.6 F (37 C)  SpO2:  97%   Total I/O In: 120 [P.O.:120] Out: -   Intake/Output Summary (Last 24 hours) at 09/03/2023 0941 Last data filed at 09/03/2023 0931 Gross per 24 hour  Intake 600 ml  Output 1650 ml  Net -1050 ml   Constitutional: Obese, sitting, no distress ENMT: ears and nose without scars or lesions, MMM CV: normal rate, 1+ pitting edema in the bilateral lower extremities Respiratory: Bilateral chest rise with no increased work of breathing Gastrointestinal: soft, non-tender, moderate distention Skin: wound on right lower extremity wrapped, otherwise no visible lesions or rashes Psych: alert, judgement/insight appropriate, appropriate mood and affect   Test Results I personally reviewed new and old clinical labs and radiology tests Lab Results  Component Value Date   NA  138 09/03/2023   K 3.6 09/03/2023   CL 101 09/03/2023   CO2 22 09/03/2023   BUN 80 (H) 09/03/2023   CREATININE 4.55 (H) 09/03/2023   GFR 13.47 (LL) 05/12/2023   CALCIUM 8.7 (L) 09/03/2023   ALBUMIN 2.6 (L) 09/03/2023   PHOS 5.5 (H) 09/03/2023    CBC Recent Labs  Lab 08/28/23 0339 08/29/23 0409 08/31/23 0346  WBC 8.3 10.0 12.5*  HGB 8.2* 9.1* 8.9*  HCT 25.4* 27.8* 27.9*  MCV 74.7* 74.1* 75.2*  PLT 196 240 260

## 2023-09-03 NOTE — Discharge Summary (Addendum)
Physician Discharge Summary   Patient: Peter Terry MRN: [id number] DOB: [birth year]  Admit date:     08/25/2023  Discharge date: 09/03/23  Discharge Physician: York Ram Deazia Lampi   PCP: [name], MD   Recommendations at discharge:    Patient will continue diuresis with bumetanide 2 mg bid and metolazone every other day 2.5 mg Limited heart failure guideline medical therapy due to acutely reduced GFR, losartan on hold.  Amlodipine changed for isosorbide and hydralazine and continue carvedilol.  Follow up renal function and electrolytes in 7 days as outpatient.  Follow up iron panel in 2 to 3 weeks.  Follow up with Dr Veto Kemps in 7 to 10 days.  Follow up with Nephrology in 09/15/23 11:40 [facility].   Discharge Diagnoses: Principal Problem:   Acute on chronic diastolic CHF (congestive heart failure) (HCC) Active Problems:   Cellulitis of right lower extremity   Acute renal failure superimposed on stage 4 chronic kidney disease (HCC)   Essential hypertension   COPD (chronic obstructive pulmonary disease) (HCC)   GERD (gastroesophageal reflux disease)   Obstructive sleep apnea   Type 2 diabetes mellitus with hyperlipidemia (HCC)   Anemia of chronic disease   Obesity, class 3  Resolved Problems:   * No resolved hospital problems. [facility] Course: Mr. Dolph was admitted to the hospital with the working diagnosis of heart failure decompensation in the setting of advance renal failure.   67/M w chronic diastolic CHF, COPD, hypertension, hyperlipidemia, diabetes, CKD 4, GERD, obesity, OSA presented to the ED with progressive dyspnea. Recent outpatient follow up with cardiology 1/13 found volume overloaded and recommended continue oral diuretic therapy with bumetanide and metolazone. At home he continue to have dyspnea and generalized weakness prompting him to come to the ED. On his initial physical examination his blood pressure was 156/87, HR 79, RR 16 and 02 saturation 95%,  lungs with decreased breath sounds bilaterally, with bilateral rales, heart with S1 and S2 present and regular with no gallops, rubs or murmurs, positive lower extremity edema.   NA 137 K 3,8 Cl 104, bicarbonate 23, glucose 288, BUN 64 and cr 3,96  Mg 1,7  BNP 106.2  High sensitive troponin 46 and 51  Wbc 18,9 hgb 9,2 plt 214   Sars covid 19 negative  Influenza negative Urine analysis SG 1,015, protein > 300, leukocytes negative and negative hgb. Glucose 100   Chest radiograph with cardiomegaly, bilateral hilar vascular congestion, bilateral central interstitial infiltrates. No effusions or infiltrates.   EKG 67 bpm, normal axis normal intervals, sinus rhythm with no significant ST segment or T wave changes.   Patient was placed on IV loop diuretics for volume overload. Noted to have right lower extremity rash and placed on antibiotics therapy for cellulitis.   01/23 renal function has been more stable.  Volume status has improved.  Completed antibiotic therapy.   01/24 stable renal function, patient will continue loop and thiazide diuretic with close follow up as outpatient with primary nephrology.   Assessment and Plan: * Acute on chronic diastolic CHF (congestive heart failure) (HCC) Echocardiogram with preserved LV systolic function with EF 55 to 60%, RV systolic function preserved,   Patient had aggressive diuresis with IV loop diuretic, negative volume status was achieved, - 11,429 ml, with significant improvement in his symptoms.   Plan to continue diuresis with oral bumetanide 2 mg bid and every other day metolazone.  Continue carvedilol and after load reduction with hydralazine and isosorbide.  Discontinue amlodipine and holding losartan due to acutely worsening GFR.   Cellulitis of right lower extremity Patient completed antibiotic therapy during his hospitalization.  Skin infection was present on admission, no sepsis.   Continue local skin care.   Acute renal  failure superimposed on stage 4 chronic kidney disease (HCC) Hypokalemia.   Serum cr peaked to 5,0  At the time of his discharge his renal function is better with serum cr at 4,55 with K at 3,6 and serum bicarbonate at 22  Na 138, P 5.5  Ca 8,7 (albumin 2,1)  BUN 80   Continue diuresis with bumetanide and metolazone.  Add P binders  Follow up renal function and electrolytes as outpatient   Anemia of chronic renal disease with stable hgb.   Essential hypertension Continue blood pressure control with carvedilol, hydralazine and isosorbide.  Discontinue amlodipine and on hold losartan.   COPD (chronic obstructive pulmonary disease) (HCC) No signs of acute exacerbation   GERD (gastroesophageal reflux disease) Continue with PPI  Obstructive sleep apnea Cpap  Type 2 diabetes mellitus with hyperlipidemia (HCC) His glucose was controlled in the hospital with basal insulin and insulin sliding scale.  He had episodic hyperglycemia up to the 200   At the time of his discharge he will resume his usual insulin regimen with close follow up of capillary glucose.   Anemia of chronic disease Iron deficiency anemia.  Cell count as been stable  Serum iron 19, TIBC 265, transferrin saturation 7 and ferritin 179   SP IV iron (iron sucrose 200 mg x 5 doses) Hold on oral iron therapy and follow up iron stores in 2 to 3 weeks as outpatient,.   Obesity, class 3 Calculated BMI is 45.3         Consultants: Nephrology  Procedures performed: none   Disposition: Home Diet recommendation:  Cardiac and Carb modified diet DISCHARGE MEDICATION: Allergies as of 09/03/2023   No Known Allergies      Medication List     STOP taking these medications    amLODipine 10 MG tablet Commonly known as: NORVASC   ferrous sulfate 325 (65 FE) MG EC tablet   losartan 100 MG tablet Commonly known as: COZAAR   potassium chloride 10 MEQ CR capsule Commonly known as: MICRO-K       TAKE these  medications    albuterol 108 (90 Base) MCG/ACT inhaler Commonly known as: VENTOLIN HFA Inhale 2 puffs into the lungs every 4 (four) hours as needed for wheezing or shortness of breath.   atorvastatin 80 MG tablet Commonly known as: LIPITOR Take 80 mg by mouth at bedtime.   Basaglar KwikPen 100 UNIT/ML INJECT 40 UNITS INTO THE SKIN TWICE A DAY   bumetanide 2 MG tablet Commonly known as: BUMEX Take 1 tablet (2 mg total) by mouth 2 (two) times daily. What changed: when to take this   calcium acetate 667 MG capsule Commonly known as: PHOSLO Take 1 capsule (667 mg total) by mouth 3 (three) times daily with meals.   carvedilol 25 MG tablet Commonly known as: COREG Take 25 mg by mouth 2 (two) times daily.   hydrALAZINE 25 MG tablet Commonly known as: APRESOLINE Take 3 tablets (75 mg total) by mouth 3 (three) times daily. What changed:  medication strength how much to take   isosorbide mononitrate 30 MG 24 hr tablet Commonly known as: IMDUR Take 1 tablet (30 mg total) by mouth daily. Start taking on: September 04, 2023 What changed:  medication  strength how much to take   metolazone 2.5 MG tablet Commonly known as: ZAROXOLYN Take 1 tablet (2.5 mg total) by mouth every other day.   NovoLOG Mix 70/30 FlexPen (70-30) 100 UNIT/ML FlexPen Generic drug: insulin aspart protamine - aspart Inject 40 Units into the skin 2 (two) times daily with a meal.   omeprazole 20 MG capsule Commonly known as: PRILOSEC Take 1 capsule (20 mg total) by mouth daily.   traMADol 50 MG tablet Commonly known as: ULTRAM Take 1 tablet (50 mg total) by mouth every 8 (eight) hours as needed.               Discharge Care Instructions  (From admission, onward)           Start     Ordered   09/03/23 0000  Discharge wound care:       Comments: Cleanse R lower leg with Vashe wound cleanser [employer] [place] [phone]), apply silver hydrofiber [employer] [place] [phone]) to R lateral leg wound daily, cover with ABD  and secure with Kerlix roll gauze beginning just above toes and ending right below knee.  Apply Ace bandage to area to secure dressing and apply light compression.  Patient should keep legs elevated as much as possible   09/03/23 1006            Discharge Exam: Filed Weights   09/02/23 0347 09/02/23 0900 09/03/23 0404  Weight: (!) 139 kg (!) 139.2 kg (!) 139.5 kg   BP 132/72 (BP Location: Right Arm)   Pulse 86   Temp 98.6 F (37 C) (Oral)   Resp 16   Ht 5\' 9"  (1.753 m)   Wt (!) 139.5 kg   SpO2 97%   BMI 45.42 kg/m   Patient with improvement in edema and dyspnea, today with mild nausea but not vomiting, if symptoms improve, plan to go home today.   Neurology awake and alert ENT with mild pallor Cardiovascular with S1 and S2 present and regular with no gallops, rubs or murmurs Respiratory with no rales or wheezing, no rhonchi Abdomen with no distention  Lower extremity edema + lymphedema component.;   Condition at discharge: stable  The results of significant diagnostics from this hospitalization (including imaging, microbiology, ancillary and laboratory) are listed below for reference.   Imaging Studies: US RENAL Result Date: 08/27/2023 CLINICAL DATA:  AKI. EXAM: RENAL / URINARY TRACT ULTRASOUND COMPLETE COMPARISON:  05/05/2023. FINDINGS: Right Kidney: Renal measurements: 10.3 x 5.7 x 5.2 cm = volume: 160.0 mL. Echogenicity within normal limits. No mass or hydronephrosis visualized. Left Kidney: Renal measurements: 10.7 x 6.7 x 5.2 cm = volume: 194.20 mL. Echogenicity within normal limits. No mass or hydronephrosis visualized. Bladder: Appears normal for degree of bladder distention. Other: Examination is limited due to patient's body habitus. IMPRESSION: No obvious abnormality is seen in the kidneys or bladder. Electronically Signed   By: Thornell Sartorius M.D.   On: 08/27/2023 19:55   MR TIBIA FIBULA RIGHT WO CONTRAST Result Date: 08/27/2023 CLINICAL DATA:  Osteomyelitis  suspected, tib/fib, no prior imaging EXAM: MRI OF LOWER RIGHT EXTREMITY WITHOUT CONTRAST TECHNIQUE: Multiplanar, multisequence MR imaging of the right lower leg was performed. No intravenous contrast was administered. COMPARISON:  None Available. FINDINGS: Bones/Joint/Cartilage Right tibia and fibula are intact without fracture. No erosions. No bone marrow edema or periostitis. Small knee joint effusion, nonspecific. No marrow replacing bone lesion. Ligaments Intact. Muscles and Tendons Mild fatty infiltration of the lower leg musculature. No intramuscular edema or fluid collection.  Mild thickening of the distal Achilles tendon. No acute tendinous abnormality. No tenosynovitis. Soft tissues Circumferential skin thickening and severe circumferential subcutaneous edema of the lower leg. Near-symmetric changes of the contralateral left lower leg seen on large field-of-view coronal sequences. Skin irregularity of the right lower leg may reflect small superficial wounds. No organized or drainable fluid collections are evident by noncontrast imaging. IMPRESSION: 1. Circumferential skin thickening and severe circumferential subcutaneous edema of the right lower leg. Near-symmetric changes of the contralateral left lower leg seen on large field-of-view coronal sequences. Findings are nonspecific and may be related to venous insufficiency, lymphedema, low protein states, amongst other etiologies. Superimposed cellulitis not excluded. 2. No evidence of osteomyelitis of the right lower leg. 3. Small knee joint effusion, nonspecific. 4. Mild distal Achilles tendinosis. Electronically Signed   By: Duanne Guess D.O.   On: 08/27/2023 16:25   ECHOCARDIOGRAM COMPLETE Result Date: 08/27/2023    ECHOCARDIOGRAM REPORT   Patient Name:   FINLAY GODBEE Date of Exam: 08/27/2023 Medical Rec #:  [id number]      Height:       69.0 in Accession #:    [id number]     Weight:       323.2 lb Date of Birth:  [birth year]      BSA:          2.533  m Patient Age:    67 years       BP:           153/77 mmHg Patient Gender: M              HR:           79 bpm. Exam Location:  Inpatient Procedure: 2D Echo, Color Doppler, Cardiac Doppler and Intracardiac            Opacification Agent Indications:    CHF I50.31  History:        Patient has no prior history of Echocardiogram examinations.  Sonographer:    Harriette Bouillon RDCS Referring Phys: [phone] PREETHA JOSEPH IMPRESSIONS  1. Left ventricular ejection fraction, by estimation, is 55 to 60%. The left ventricle has normal function. The left ventricle has no regional wall motion abnormalities. Left ventricular diastolic parameters are consistent with Grade I diastolic dysfunction (impaired relaxation). Elevated left ventricular end-diastolic pressure.  2. Right ventricular systolic function is normal. The right ventricular size is normal.  3. The mitral valve is normal in structure. No evidence of mitral valve regurgitation. No evidence of mitral stenosis.  4. The aortic valve was not well visualized. Aortic valve regurgitation is not visualized. No aortic stenosis is present.  5. Aortic dilatation noted. There is mild dilatation of the ascending aorta, measuring 39 mm.  6. The inferior vena cava is normal in size with greater than 50% respiratory variability, suggesting right atrial pressure of 3 mmHg. FINDINGS  Left Ventricle: Left ventricular ejection fraction, by estimation, is 55 to 60%. The left ventricle has normal function. The left ventricle has no regional wall motion abnormalities. The left ventricular internal cavity size was normal in size. There is  no left ventricular hypertrophy. Left ventricular diastolic parameters are consistent with Grade I diastolic dysfunction (impaired relaxation). Elevated left ventricular end-diastolic pressure. Right Ventricle: The right ventricular size is normal. No increase in right ventricular wall thickness. Right ventricular systolic function is normal. Left Atrium: Left  atrial size was normal in size. Right Atrium: Right atrial size was normal in size. Pericardium: There is no evidence  of pericardial effusion. Mitral Valve: The mitral valve is normal in structure. No evidence of mitral valve regurgitation. No evidence of mitral valve stenosis. Tricuspid Valve: The tricuspid valve is normal in structure. Tricuspid valve regurgitation is not demonstrated. No evidence of tricuspid stenosis. Aortic Valve: The aortic valve was not well visualized. Aortic valve regurgitation is not visualized. No aortic stenosis is present. Pulmonic Valve: The pulmonic valve was normal in structure. Pulmonic valve regurgitation is not visualized. No evidence of pulmonic stenosis. Aorta: Aortic dilatation noted. There is mild dilatation of the ascending aorta, measuring 39 mm. Venous: The inferior vena cava is normal in size with greater than 50% respiratory variability, suggesting right atrial pressure of 3 mmHg. IAS/Shunts: The interatrial septum was not well visualized.  LEFT VENTRICLE PLAX 2D LVIDd:         5.60 cm   Diastology LVIDs:         4.20 cm   LV e' medial:    5.22 cm/s LV PW:         1.00 cm   LV E/e' medial:  15.5 LV IVS:        1.00 cm   LV e' lateral:   3.70 cm/s LVOT diam:     2.40 cm   LV E/e' lateral: 21.8 LV SV:         123 LV SV Index:   49 LVOT Area:     4.52 cm  LEFT ATRIUM           Index LA diam:      3.50 cm 1.38 cm/m LA Vol (A2C): 87.4 ml 34.50 ml/m  AORTIC VALVE LVOT Vmax:   127.00 cm/s LVOT Vmean:  87.400 cm/s LVOT VTI:    0.272 m  AORTA Ao Root diam: 3.70 cm Ao Asc diam:  3.90 cm MITRAL VALVE MV Area (PHT): 2.65 cm     SHUNTS MV E velocity: 80.80 cm/s   Systemic VTI:  0.27 m MV A velocity: 105.00 cm/s  Systemic Diam: 2.40 cm MV E/A ratio:  0.77 Charlton Haws MD Electronically signed by Charlton Haws MD Signature Date/Time: 08/27/2023/12:38:20 PM    Final    DG Chest Port 1 View Result Date: 08/25/2023 CLINICAL DATA:  Shortness of breath. EXAM: PORTABLE CHEST 1 VIEW  COMPARISON:  05/02/2023 FINDINGS: Mild cardiomegaly. Unchanged mediastinal contours. Aortic atherosclerosis. Vascular congestion. No focal airspace disease. No large pleural effusion. No pneumothorax. On limited assessment, no acute osseous findings. IMPRESSION: Mild cardiomegaly with vascular congestion. Electronically Signed   By: Narda Rutherford M.D.   On: 08/25/2023 20:41    Microbiology: Results for orders placed or performed during the hospital encounter of 08/25/23  Resp panel by RT-PCR (RSV, Flu A&B, Covid) Anterior Nasal Swab     Status: None   Collection Time: 08/25/23  8:21 PM   Specimen: Anterior Nasal Swab  Result Value Ref Range Status   SARS Coronavirus 2 by RT PCR NEGATIVE NEGATIVE Final    Comment: (NOTE) SARS-CoV-2 target nucleic acids are NOT DETECTED.  The SARS-CoV-2 RNA is generally detectable in upper respiratory specimens during the acute phase of infection. The lowest concentration of SARS-CoV-2 viral copies this assay can detect is 138 copies/mL. A negative result does not preclude SARS-Cov-2 infection and should not be used as the sole basis for treatment or other patient management decisions. A negative result may occur with  improper specimen collection/handling, submission of specimen other than nasopharyngeal swab, presence of viral mutation(s) within the areas targeted by  this assay, and inadequate number of viral copies(<138 copies/mL). A negative result must be combined with clinical observations, patient history, and epidemiological information. The expected result is Negative.  Fact Sheet for Patients:  BloggerCourse.com  Fact Sheet for Healthcare Providers:  SeriousBroker.it  This test is no t yet approved or cleared by the [place] FDA and  has been authorized for detection and/or diagnosis of SARS-CoV-2 by FDA under an Emergency Use Authorization (EUA). This EUA will remain  in effect  (meaning this test can be used) for the duration of the COVID-19 declaration under Section 564(b)(1) of the Act, 21 U.S.C.section 360bbb-3(b)(1), unless the authorization is terminated  or revoked sooner.       Influenza A by PCR NEGATIVE NEGATIVE Final   Influenza B by PCR NEGATIVE NEGATIVE Final    Comment: (NOTE) The Xpert Xpress SARS-CoV-2/FLU/RSV plus assay is intended as an aid in the diagnosis of influenza from Nasopharyngeal swab specimens and should not be used as a sole basis for treatment. Nasal washings and aspirates are unacceptable for Xpert Xpress SARS-CoV-2/FLU/RSV testing.  Fact Sheet for Patients: BloggerCourse.com  Fact Sheet for Healthcare Providers: SeriousBroker.it  This test is not yet approved or cleared by the [place] FDA and has been authorized for detection and/or diagnosis of SARS-CoV-2 by FDA under an Emergency Use Authorization (EUA). This EUA will remain in effect (meaning this test can be used) for the duration of the COVID-19 declaration under Section 564(b)(1) of the Act, 21 U.S.C. section 360bbb-3(b)(1), unless the authorization is terminated or revoked.     Resp Syncytial Virus by PCR NEGATIVE NEGATIVE Final    Comment: (NOTE) Fact Sheet for Patients: BloggerCourse.com  Fact Sheet for Healthcare Providers: SeriousBroker.it  This test is not yet approved or cleared by the [place] FDA and has been authorized for detection and/or diagnosis of SARS-CoV-2 by FDA under an Emergency Use Authorization (EUA). This EUA will remain in effect (meaning this test can be used) for the duration of the COVID-19 declaration under Section 564(b)(1) of the Act, 21 U.S.C. section 360bbb-3(b)(1), unless the authorization is terminated or revoked.  Performed at [facility], [address] [place], [place] [address]      Labs: CBC: Recent Labs  Lab 08/28/23 [phone] 08/29/23 0409 08/31/23 0346  WBC 8.3 10.0 12.5*  HGB 8.2* 9.1* 8.9*  HCT 25.4* 27.8* 27.9*  MCV 74.7* 74.1* 75.2*  PLT 196 240 260   Basic Metabolic Panel: Recent Labs  Lab 08/30/23 0358 08/31/23 0346 09/01/23 0352 09/02/23 0427 09/03/23 0509  NA 137 136 138 135 138  K 3.9 3.4* 3.7 3.7 3.6  CL 99 99 100 94* 101  CO2 23 21* 25 23 22   GLUCOSE 187* 216* 148* 181* 209*  BUN 74* 82* 79* 82* 80*  CREATININE 4.90* 5.04* 4.60* 4.80* 4.55*  CALCIUM 8.3* 8.0* 8.4* 8.1* 8.7*  PHOS  --   --   --   --  5.5*   Liver Function Tests: Recent Labs  Lab 08/28/23 0339 09/03/23 0509  AST 20  --   ALT 17  --   ALKPHOS 65  --   BILITOT 0.4  --   PROT 7.2  --   ALBUMIN 2.1* 2.6*   CBG: Recent Labs  Lab 09/02/23 0744 09/02/23 1205 09/02/23 1603 09/02/23 2100 09/03/23 0800  GLUCAP 158* 252* 221* 238* 206*    Discharge time spent: greater than 30 minutes.  Signed: Coralie Keens, MD [facility] 09/03/2023

## 2023-09-03 NOTE — TOC Transition Note (Addendum)
Transition of Care [facility]) - Discharge Note   Patient Details  Name: Peter Terry MRN: [id number] Date of Birth: 1955/12/27  Transition [facility] CM/SW Contact:  Gala Lewandowsky, RN Phone Number: 09/03/2023, 10:19 AM   Clinical Narrative: Patient to transition home today. Case Manager discussed home health services with the patient again and he is declining services. Patient reports that his sister will transport home via private vehicle. Case Manager did call sister and is awaiting phone call back regarding transportation home. No further needs identified at this time.      1029 09-03-23 Sister called and states she will arrive at 11:15 am to transport patient home. Medications to be picked up via [facility].    Final next level of care: Home/Self Care Barriers to Discharge: No Barriers Identified   Patient Goals and CMS Choice Patient states their goals for this hospitalization and ongoing recovery are:: patient to return home with family support.   Discharge Plan and Services Additional resources added to the After Visit Summary for      Discharge Planning Services: CM Consult (cane, rolling walker)    Social Drivers of Health (SDOH) Interventions SDOH Screenings   Food Insecurity: No Food Insecurity (08/26/2023)  Housing: Low Risk  (08/26/2023)  Transportation Needs: No Transportation Needs (08/26/2023)  Utilities: Not At Risk (08/26/2023)  Depression (PHQ2-9): High Risk (03/31/2023)  Financial Resource Strain: Low Risk  (05/26/2023)  Physical Activity: Insufficiently Active (05/26/2023)  Social Connections: Socially Isolated (08/26/2023)  Stress: Stress Concern Present (05/26/2023)  Tobacco Use: Medium Risk (08/25/2023)   Readmission Risk Interventions     No data to display

## 2023-09-06 ENCOUNTER — Telehealth: Payer: Self-pay

## 2023-09-06 DIAGNOSIS — I5022 Chronic systolic (congestive) heart failure: Secondary | ICD-10-CM

## 2023-09-06 DIAGNOSIS — E782 Mixed hyperlipidemia: Secondary | ICD-10-CM

## 2023-09-06 NOTE — Transitions of Care (Post Inpatient/ED Visit) (Signed)
09/06/2023  Name: Peter Terry MRN: [id number] DOB: [birth year]  Today's TOC FU Call Status: Today's TOC FU Call Status:: Successful TOC FU Call Completed TOC FU Call Complete Date: 09/06/23 Patient's Name and Date of Birth confirmed.  Transition Care Management Follow-up Telephone Call Date of Discharge: 09/03/23 Discharge Facility: [facility]) Type of Discharge: Inpatient Admission Primary Inpatient Discharge Diagnosis:: Acute on Chronic CHF How have you been since you were released from the hospital?: Better (" Moving A Little Slow ") Any questions or concerns?: No  Items Reviewed: Did you receive and understand the discharge instructions provided?: Yes Medications obtained,verified, and reconciled?: Yes (Medications Reviewed) Any new allergies since your discharge?: No Dietary orders reviewed?: Yes Type of Diet Ordered:: low sodium heart healthy Do you have support at home?: Yes People in Home: child(ren), adult  Medications Reviewed Today: Medications Reviewed Today     Reviewed by Wyline Mood, RN (Case Manager) on 09/06/23 at 1449  Med List Status: <None>   Medication Order Taking? Sig Documenting Provider Last Dose Status Informant  albuterol (VENTOLIN HFA) 108 (90 Base) MCG/ACT inhaler [id number] Yes Inhale 2 puffs into the lungs every 4 (four) hours as needed for wheezing or shortness of breath. [provider] Taking Active Self, Pharmacy Records  atorvastatin (LIPITOR) 80 MG tablet [id number] Yes Take 80 mg by mouth at bedtime. [provider] Taking Active Self, Pharmacy Records           Med Note [facility] [place], SUSAN A   Thu Aug 26, 2023  3:16 PM) Pt adamant of taking medication. Dispense report does not support this claim.  bumetanide (BUMEX) 1 MG tablet [id number] Yes Take 2 tablets (2 mg total) by mouth 2 (two) times daily. Arrien, York Ram, MD Taking Active   calcium acetate [facility]) 667 MG capsule [id number] Yes Take 1  capsule (667 mg total) by mouth 3 (three) times daily with meals. Arrien, York Ram, MD Taking Active   carvedilol (COREG) 25 MG tablet [id number] Yes Take 25 mg by mouth 2 (two) times daily. [provider] Taking Active Self, Pharmacy Records  hydrALAZINE (APRESOLINE) 25 MG tablet [id number] Yes Take 3 tablets (75 mg total) by mouth 3 (three) times daily. Arrien, York Ram, MD Taking Active   insulin aspart protamine - aspart (NOVOLOG MIX 70/30 FLEXPEN) (70-30) 100 UNIT/ML FlexPen [id number] Yes Inject 40 Units into the skin 2 (two) times daily with a meal. [name], MD Taking Active Self, Pharmacy Records  Insulin Glargine [facility]) 100 UNIT/ML [id number] Yes INJECT 40 UNITS INTO THE SKIN TWICE A DAY [name], MD Taking Active Self, Pharmacy Records  isosorbide mononitrate (IMDUR) 30 MG 24 hr tablet [id number] Yes Take 1 tablet (30 mg total) by mouth daily. Arrien, York Ram, MD Taking Active   metolazone (ZAROXOLYN) 2.5 MG tablet [id number] Yes Take 1 tablet (2.5 mg total) by mouth every other day. Madireddy, Marlyn Corporal, MD Taking Active Self, Pharmacy Records  omeprazole (PRILOSEC) 20 MG capsule [id number] Yes Take 1 capsule (20 mg total) by mouth daily. [name], MD Taking Active Self, Pharmacy Records  traMADol Janean Sark) 50 MG tablet [id number] Yes Take 1 tablet (50 mg total) by mouth every 8 (eight) hours as needed. [name], MD Taking Active Self, Pharmacy Records            Home Care and Equipment/Supplies: Were Home Health Services Ordered?: No Any new equipment or medical supplies ordered?: No  Functional Questionnaire: Do you  need assistance with bathing/showering or dressing?: No Do you need assistance with meal preparation?: No Do you need assistance with eating?: No Do you have difficulty maintaining continence: No Do you need assistance with getting out of bed/getting out of a chair/moving?: No Do you have difficulty  managing or taking your medications?: No  Follow up appointments reviewed: PCP Follow-up appointment confirmed?: Yes Date of PCP follow-up appointment?: 09/13/23 Follow-up Provider: Dr. Herbie Drape Specialist [facility] Follow-up appointment confirmed?: Yes Date of Specialist follow-up appointment?: 09/15/23 Follow-Up Specialty Provider:: Nephrology,[place]wo[place], Carvel Getting Do you need transportation to your follow-up appointment?: No Do you understand care options if your condition(s) worsen?: Yes-patient verbalized understanding  SDOH Interventions Today    Flowsheet Row Most Recent Value  SDOH Interventions   Food Insecurity Interventions Intervention Not Indicated  Housing Interventions Intervention Not Indicated  Transportation Interventions Intervention Not Indicated  Utilities Interventions Intervention Not Indicated      Interventions Today    Flowsheet Row Most Recent Value  Chronic Disease   Chronic disease during today's visit Congestive Heart Failure (CHF)  General Interventions   General Interventions Discussed/Reviewed Doctor Visits  Doctor Visits Discussed/Reviewed Doctor Visits Discussed, Doctor Visits Reviewed, PCP, Specialist  Exercise Interventions   Exercise Discussed/Reviewed Assistive device use and maintanence, Physical Activity  Physical Activity Discussed/Reviewed Physical Activity Discussed, Physical Activity Reviewed  Jaquita Folds activity slowly]  Education Interventions   Education Provided Provided Education  Provided Verbal Education On Blood Sugar Monitoring, Medication, When to see the doctor  Pharmacy Interventions   Pharmacy Dicussed/Reviewed Medications and their functions, Medication Adherence       TOC Interventions Today    Flowsheet Row Most Recent Value  TOC Interventions   TOC Interventions Discussed/Reviewed TOC Interventions Discussed, TOC Interventions Reviewed, Contacted Home Health RN/OT/PT, Contacted provider for patient  needs, Post discharge activity limitations per provider      Patient declined [facility] Program enrollment at this time.  Contact information for [facility] RNCM provided should patient have questions after today's TOC call.  Ercil Cassis Daphine Deutscher BSN, Programmer, systems / Transitions of Care [place] / [facility], [facility] Direct Dial Number:  [phone]

## 2023-09-08 ENCOUNTER — Ambulatory Visit: Payer: Self-pay

## 2023-09-08 ENCOUNTER — Telehealth: Payer: Self-pay

## 2023-09-08 MED ORDER — CARVEDILOL 25 MG PO TABS: 25.0000 mg | ORAL_TABLET | Freq: Two times a day (BID) | ORAL | 3 refills | Status: DC

## 2023-09-08 MED ORDER — ATORVASTATIN CALCIUM 80 MG PO TABS: 80.0000 mg | ORAL_TABLET | Freq: Every day | ORAL | 3 refills | Status: AC

## 2023-09-08 NOTE — Telephone Encounter (Signed)
[facility]  Dm/cma

## 2023-09-08 NOTE — Patient Outreach (Signed)
  Care Coordination   Follow Up Visit Note   09/08/2023 Name: SORREN VALLIER MRN: [id number] DOB: Nov 16, 1955  YANI LAL is a 68 y.o. year old male who sees Rudd, Bertram Millard, MD for primary care. RNCM received message from Dr.Rudd's office advising that the medication refills requested by patient [facility].  RNCM called the patient and advised that Dr. Veto Kemps had sent the medications that he requested refills on [facility].    What matters to the patients health and wellness today?   To consistently take prescribed medications as directed, at the correct dosage, and at the right time, aiming to achieve optimal medical/ health condition   Goals Addressed   None     SDOH assessments and interventions completed:  No{THN Tip this will not be part of the note when signed-REQUIRED REPORT FIELD DO NOT DELETE (Optional):27901} On 1/27Potential SDOH assessed on 1/    Care Coordination Interventions:  Yes, provided {THN Tip this will not be part of the note when signed-REQUIRED REPORT FIELD DO NOT DELETE (Optional):27901}  Follow up plan: No further intervention required.   Encounter Outcome:  Patient Visit Completed {THN Tip this will not be part of the note when signed-REQUIRED REPORT FIELD DO NOT DELETE (Optional):27901}

## 2023-09-08 NOTE — Addendum Note (Signed)
Addended by: [name] on: 09/08/2023 01:14 PM   Modules accepted: Orders

## 2023-09-08 NOTE — Addendum Note (Signed)
Addended by: Waymond Cera on: 09/08/2023 10:15 AM   Modules accepted: Orders

## 2023-09-08 NOTE — Patient Outreach (Signed)
  Care Coordination   Follow Up Visit Note   09/07/2023 Name: Peter Terry MRN: [id number] DOB: Aug 07, 1956  Peter Terry is a 68 y.o. year old male who sees Peter Terry, Peter Millard, MD for primary care. I  made a telephone outreach to patient's PCP, Dr. Herbie Terry on Tuesday, 09/07/2023 and left message with front desk advising that I had completed a post-hospitalization Transition of Care Assessment on Monday, September 06, 2023.  Patient indicated that he was doing well except for feeling tired after his most recent hospitalization.  RNCM completed a full medication review with the patient and re-emphasized the medication changes per the hospital discharge summary.  The patient's follow-up appointment with Dr. Veto Terry is scheduled for 09/13/2023.  He indicated that he thought 3 of his medications would need refilling before he is able to see Dr. Veto Terry on 09/13/23.   Patient requested RNCM assistance and sending a message to his PCP regarding these medications.    They are:  Carvedilol, Lipitor, Novolog 70/30 Insulin.  [facility] [address] [place], [place].  What matters to the patients health and wellness today?  Speedy recovery and to gain his strength back.    Goals Addressed   None     SDOH assessments and interventions completed:  Yes    Care Coordination Interventions:  Yes, provided   09/07/23 - Telephone contact completed to Dr. Veto Terry office as requested per the patient.  Copy of RNCM TOC note forwarded to PCP for care coordination purposes.  Follow up plan: Please follow up with the patient, Mr. Peter Terry regarding the status of his refills.  Mr. Peter Terry has also been advised to contact his PCP office if he has not heard back from them by Wednesday.  Encounter Outcome:  Patient Visit Completed   Peter Terry BSN, RN RN Care Manager / Transitions of Care [place] / [facility], [facility] Direct Dial Number:  [phone]

## 2023-09-13 ENCOUNTER — Encounter: Payer: Self-pay | Admitting: Family Medicine

## 2023-09-13 ENCOUNTER — Telehealth: Payer: Self-pay

## 2023-09-13 ENCOUNTER — Ambulatory Visit: Payer: Medicare HMO | Admitting: Family Medicine

## 2023-09-13 VITALS — BP 154/80 | HR 90 | Temp 99.1°F | Wt 312.4 lb

## 2023-09-13 DIAGNOSIS — N184 Chronic kidney disease, stage 4 (severe): Secondary | ICD-10-CM

## 2023-09-13 DIAGNOSIS — D638 Anemia in other chronic diseases classified elsewhere: Secondary | ICD-10-CM

## 2023-09-13 DIAGNOSIS — I5022 Chronic systolic (congestive) heart failure: Secondary | ICD-10-CM

## 2023-09-13 DIAGNOSIS — E1140 Type 2 diabetes mellitus with diabetic neuropathy, unspecified: Secondary | ICD-10-CM | POA: Diagnosis not present

## 2023-09-13 DIAGNOSIS — E876 Hypokalemia: Secondary | ICD-10-CM

## 2023-09-13 DIAGNOSIS — Z794 Long term (current) use of insulin: Secondary | ICD-10-CM

## 2023-09-13 DIAGNOSIS — N179 Acute kidney failure, unspecified: Secondary | ICD-10-CM | POA: Diagnosis not present

## 2023-09-13 DIAGNOSIS — E1122 Type 2 diabetes mellitus with diabetic chronic kidney disease: Secondary | ICD-10-CM | POA: Diagnosis not present

## 2023-09-13 DIAGNOSIS — I10 Essential (primary) hypertension: Secondary | ICD-10-CM

## 2023-09-13 DIAGNOSIS — E1142 Type 2 diabetes mellitus with diabetic polyneuropathy: Secondary | ICD-10-CM | POA: Diagnosis not present

## 2023-09-13 DIAGNOSIS — I129 Hypertensive chronic kidney disease with stage 1 through stage 4 chronic kidney disease, or unspecified chronic kidney disease: Secondary | ICD-10-CM | POA: Diagnosis not present

## 2023-09-13 DIAGNOSIS — E782 Mixed hyperlipidemia: Secondary | ICD-10-CM | POA: Diagnosis not present

## 2023-09-13 LAB — CBC
HCT: 29.9 % — ABNORMAL LOW (ref 39.0–52.0)
Hemoglobin: 9.6 g/dL — ABNORMAL LOW (ref 13.0–17.0)
MCHC: 32.1 g/dL (ref 30.0–36.0)
MCV: 76.9 fL — ABNORMAL LOW (ref 78.0–100.0)
Platelets: 234 10*3/uL (ref 150.0–400.0)
RBC: 3.88 Mil/uL — ABNORMAL LOW (ref 4.22–5.81)
RDW: 17.6 % — ABNORMAL HIGH (ref 11.5–15.5)
WBC: 7.5 10*3/uL (ref 4.0–10.5)

## 2023-09-13 LAB — BASIC METABOLIC PANEL WITH GFR
BUN: 90 mg/dL (ref 6–23)
CO2: 24 meq/L (ref 19–32)
Calcium: 9.2 mg/dL (ref 8.4–10.5)
Chloride: 102 meq/L (ref 96–112)
Creatinine, Ser: 4.79 mg/dL (ref 0.40–1.50)
GFR: 12.04 mL/min — CL
Glucose, Bld: 114 mg/dL — ABNORMAL HIGH (ref 70–99)
Potassium: 3.1 meq/L — ABNORMAL LOW (ref 3.5–5.1)
Sodium: 139 meq/L (ref 135–145)

## 2023-09-13 MED ORDER — POTASSIUM CHLORIDE CRYS ER 20 MEQ PO TBCR: 20.0000 meq | EXTENDED_RELEASE_TABLET | Freq: Two times a day (BID) | ORAL | 0 refills | Status: DC

## 2023-09-13 MED ORDER — NOVOLOG MIX 70/30 FLEXPEN (70-30) 100 UNIT/ML ~~LOC~~ SUPN: 45.0000 [IU] | PEN_INJECTOR | Freq: Two times a day (BID) | SUBCUTANEOUS | 5 refills | Status: DC

## 2023-09-13 MED ORDER — BASAGLAR KWIKPEN 100 UNIT/ML ~~LOC~~ SOPN: 45.0000 [IU] | PEN_INJECTOR | Freq: Two times a day (BID) | SUBCUTANEOUS | 3 refills | Status: DC

## 2023-09-13 MED ORDER — GABAPENTIN 100 MG PO CAPS: 100.0000 mg | ORAL_CAPSULE | Freq: Three times a day (TID) | ORAL | 3 refills | Status: DC | PRN

## 2023-09-13 NOTE — Telephone Encounter (Signed)
Received a critical result for:  Creatinine 4.79  BUN 90 GFR  12.04  Please review and advise.  Thanks. Dm/cma

## 2023-09-13 NOTE — Assessment & Plan Note (Signed)
Blood pressure remains high. Continue carvedilol 25 mg bid and hydralazine 75 mg TID. Patient is engaged with cardiology and nephrology. He will be seeing the nephrologist on 09/15/2023.

## 2023-09-13 NOTE — Assessment & Plan Note (Signed)
Discussed serious nature of current renal function and difficulty with managing fluid overload in light of CKD. Discussed that dialysis may be needed int he near future. He will follow-up with nephrology on 2/5.

## 2023-09-13 NOTE — Assessment & Plan Note (Signed)
Diabetes remains in poor control. I will have him increase his Toujeo to 45 units bid and his Novulin 70/30 to 45 units bid.

## 2023-09-13 NOTE — Addendum Note (Signed)
Addended by: [name] on: 09/13/2023 05:47 PM   Modules accepted: Orders

## 2023-09-13 NOTE — Assessment & Plan Note (Addendum)
Compensated. Weight is down 33 lbs from his last visit. He is working with cardiology and nephrology concerning management. Continue carvedilol 25 mg bid, bumetanide (Bumex) 2 mg twice daily, and metolazone 2.5 mg every other day. He is working with his sister towards establishing Social Security disability. I support this effort, as I feel he will likely not be able to return to meaningful work, esp. if his renal disease progresses to dialysis.

## 2023-09-13 NOTE — Progress Notes (Signed)
[facility] [address] [place] [address] Dept: [phone] Dept Fax: [phone]  Transitional Care Management Visit  Subjective:    Patient ID: Peter Terry, male    DOB: 28-Jan-1956, 68 y.o..   MRN: [id number]  Chief Complaint  Patient presents with   Hospitalization Follow-up    Hospital f/u from 08/25/23.  Still feels real fatigue and tingling in feet off/on.    History of Present Illness:  Patient is in today for follow-up from his recent hospitalization. Mr. Kollmann was admitted at [facility] from 1/15-1/24/2025 with acute on chronic diastolic heart failure and marked lower leg edema. He underwent controlled diuresis, dropping 11 liters of fluid. This has been complicated by acute renal failure on top of stage 4 CKD. He also has essential hypertension, COPD, OSA, Type 2 diabetes, morbid obesity, anemia of chronic disease, and chronic stasis dermatitis with cellulitis.  Mr. Mancinas notes that he is doing fairly well at home. His diuretics were changed to included bumetanide 1 mg 2 tabs twice daily with metolazone 2.5 mg every other day. He has been monitoring his weight, which has been staying between 304-308 lbs. In addition, he remains on carvedilol 25 mg bid, hydralazine 25 mg 3 tabs TID, and isosorbide mononitrate 30 mg daily.  Mr. Manes has Type 2 diabetes. He is currenlty managed on insulin glargine (Basaglar) 40 units bid and insulin aspart protamine-aspart (Novalog 70/30) 40 units bid.  Past Medical History: Patient Active Problem List   Diagnosis Date Noted   Cellulitis of right lower extremity 09/01/2023   Anemia of chronic disease 09/01/2023   Obesity, class 3 09/01/2023   Acute renal failure superimposed on stage 4 chronic kidney disease (HCC) 08/26/2023   Acute on chronic diastolic CHF (congestive heart failure) (HCC) 08/25/2023   Chronic right hip pain 07/13/2023   Venous stasis ulcer of left calf limited to  breakdown of skin without varicose veins (HCC) 07/13/2023   Dyspnea on exertion 05/02/2023   Chronic kidney disease, stage 4 (severe) (HCC) 03/31/2023   Obstructive sleep apnea 03/31/2023   GERD (gastroesophageal reflux disease) 03/31/2023   Edema of both lower legs 03/31/2023   Venous stasis dermatitis 03/31/2023   Tinea pedis 03/31/2023   Heart Failure with Preserved EF (HCC) 03/31/2023   Diabetic peripheral neuropathy (HCC) 03/31/2023   Mixed hyperlipidemia 03/30/2023   Essential hypertension 03/30/2023   COPD (chronic obstructive pulmonary disease) (HCC) 03/30/2023   Type 2 diabetes mellitus with diabetic neuropathy, unspecified (HCC) 11/04/2022   Hypomagnesemia 11/04/2022   Insulin long-term use (HCC) 08/21/2020   Morbid obesity with body mass index (BMI) of 50.0 to 59.9 in adult [facility]) 07/25/2020   Retinal tear 02/20/2013   Past Surgical History:  Procedure Laterality Date   APPENDECTOMY     LUMBAR DISC SURGERY     L4-L5   Family History  Problem Relation Age of Onset   Cancer Mother    Diabetes Mother    Kidney disease Mother    Heart disease Father    Cancer Father        Colon   Kidney disease Father    Diabetes Father    Diabetes Brother    Heart disease Paternal Aunt    Outpatient Medications Prior to Visit  Medication Sig Dispense Refill   albuterol (VENTOLIN HFA) 108 (90 Base) MCG/ACT inhaler Inhale 2 puffs into the lungs every 4 (four) hours as needed for wheezing or shortness of breath.     atorvastatin (  LIPITOR) 80 MG tablet Take 1 tablet (80 mg total) by mouth at bedtime. 90 tablet 3   B-D UF III MINI PEN NEEDLES 31G X 5 MM MISC Inject into the skin daily.     bumetanide (BUMEX) 1 MG tablet Take 2 tablets (2 mg total) by mouth 2 (two) times daily. 120 tablet 0   calcium acetate (PHOSLO) 667 MG capsule Take 1 capsule (667 mg total) by mouth 3 (three) times daily with meals. 90 capsule 0   carvedilol (COREG) 25 MG tablet Take 1 tablet (25 mg total) by mouth  2 (two) times daily. 90 tablet 3   hydrALAZINE (APRESOLINE) 25 MG tablet Take 3 tablets (75 mg total) by mouth 3 (three) times daily. 270 tablet 0   isosorbide mononitrate (IMDUR) 30 MG 24 hr tablet Take 1 tablet (30 mg total) by mouth daily. 30 tablet 0   metolazone (ZAROXOLYN) 2.5 MG tablet Take 1 tablet (2.5 mg total) by mouth every other day. 45 tablet 3   omeprazole (PRILOSEC) 20 MG capsule Take 1 capsule (20 mg total) by mouth daily. 90 capsule 3   traMADol (ULTRAM) 50 MG tablet Take 1 tablet (50 mg total) by mouth every 8 (eight) hours as needed. 30 tablet 2   insulin aspart protamine - aspart (NOVOLOG MIX 70/30 FLEXPEN) (70-30) 100 UNIT/ML FlexPen Inject 40 Units into the skin 2 (two) times daily with a meal. 30 mL 5   Insulin Glargine (BASAGLAR KWIKPEN) 100 UNIT/ML INJECT 40 UNITS INTO THE SKIN TWICE A DAY 75 mL 3   No facility-administered medications prior to visit.   No Known Allergies   Objective:   Today's Vitals   09/13/23 1326  BP: (!) 154/80  Pulse: 90  Temp: 99.1 F (37.3 C)  TempSrc: Temporal  SpO2: 98%  Weight: (!) 312 lb 6.4 oz (141.7 kg)   Body mass index is 46.13 kg/m.   General: Well developed, well nourished. No acute distress. Lungs: Clear to auscultation bilaterally. No wheezing, rales or rhonchi. CV: RRR without murmurs or rubs. Pulses 2+ bilaterally. Extremities: 3+ edema of both lower legs. There are some wrinkles present. Chronic woody changes   to the lower legs. No seepage of fluid noted. Psych: Alert and oriented. Normal mood and affect.  Home Glucose monitor: 30-day average: 265 14-day average: 272 7-day average: 295  Health Maintenance Due  Topic Date Due   Medicare Annual Wellness (AWV)  Never done   OPHTHALMOLOGY EXAM  Never done   Hepatitis C Screening  Never done   DTaP/Tdap/Td (1 - Tdap) Never done   Zoster Vaccines- Shingrix (1 of 2) Never done     Assessment & Plan:   Problem List Items Addressed This Visit        Cardiovascular and Mediastinum   Essential hypertension   Blood pressure remains high. Continue carvedilol 25 mg bid and hydralazine 75 mg TID. Patient is engaged with cardiology and nephrology. He will be seeing the nephrologist on 09/15/2023.      Relevant Orders   Basic metabolic panel (Completed)   Heart Failure with Preserved EF (HCC) - Primary   Compensated. Weight is down 33 lbs from his last visit. He is working with cardiology and nephrology concerning management. Continue carvedilol 25 mg bid, bumetanide (Bumex) 2 mg twice daily, and metolazone 2.5 mg every other day. He is working with his sister towards establishing Social Security disability. I support this effort, as I feel he will likely not be able to return  to meaningful work, esp. if his renal disease progresses to dialysis.        Endocrine   Diabetic peripheral neuropathy (HCC)   Notes intermittent lower extremity pain. I will try prescribing gabapentin for intermittent use.      Relevant Medications   insulin aspart protamine - aspart (NOVOLOG MIX 70/30 FLEXPEN) (70-30) 100 UNIT/ML FlexPen   Insulin Glargine (BASAGLAR KWIKPEN) 100 UNIT/ML   gabapentin (NEURONTIN) 100 MG capsule   Type 2 diabetes mellitus with diabetic neuropathy, unspecified (HCC)   Diabetes remains in poor control. I will have him increase his Toujeo to 45 units bid and his Novulin 70/30 to 45 units bid.      Relevant Medications   insulin aspart protamine - aspart (NOVOLOG MIX 70/30 FLEXPEN) (70-30) 100 UNIT/ML FlexPen   Insulin Glargine (BASAGLAR KWIKPEN) 100 UNIT/ML   gabapentin (NEURONTIN) 100 MG capsule   Other Relevant Orders   Basic metabolic panel (Completed)     Genitourinary   Acute renal failure superimposed on stage 4 chronic kidney disease (HCC)   Discussed serious nature of current renal function and difficulty with managing fluid overload in light of CKD. Discussed that dialysis may be needed int he near future. He will follow-up  with nephrology on 2/5.      Relevant Orders   Basic metabolic panel (Completed)     Other   Anemia of chronic disease   He will have this reassessed by nephrology.      Relevant Orders   CBC (Completed)   Mixed hyperlipidemia   Lipids are at goal. Continue atorvastatin 80 mg daily.       Return in about 4 weeks (around 10/11/2023) for Reassessment.   [name], MD

## 2023-09-13 NOTE — Assessment & Plan Note (Signed)
He will have this reassessed by nephrology.

## 2023-09-13 NOTE — Assessment & Plan Note (Signed)
Notes intermittent lower extremity pain. I will try prescribing gabapentin for intermittent use.

## 2023-09-13 NOTE — Assessment & Plan Note (Signed)
Lipids are at goal. Continue atorvastatin 80 mg daily.

## 2023-09-14 NOTE — Telephone Encounter (Signed)
 Error

## 2023-09-14 NOTE — Telephone Encounter (Signed)
Lft VM to Rtn call regarding lab results/recommendations. Dm/cma

## 2023-09-15 DIAGNOSIS — I129 Hypertensive chronic kidney disease with stage 1 through stage 4 chronic kidney disease, or unspecified chronic kidney disease: Secondary | ICD-10-CM | POA: Diagnosis not present

## 2023-09-15 DIAGNOSIS — D631 Anemia in chronic kidney disease: Secondary | ICD-10-CM | POA: Diagnosis not present

## 2023-09-15 DIAGNOSIS — N184 Chronic kidney disease, stage 4 (severe): Secondary | ICD-10-CM | POA: Diagnosis not present

## 2023-09-15 DIAGNOSIS — N179 Acute kidney failure, unspecified: Secondary | ICD-10-CM | POA: Diagnosis not present

## 2023-09-15 DIAGNOSIS — E1122 Type 2 diabetes mellitus with diabetic chronic kidney disease: Secondary | ICD-10-CM | POA: Diagnosis not present

## 2023-09-15 DIAGNOSIS — Z794 Long term (current) use of insulin: Secondary | ICD-10-CM | POA: Diagnosis not present

## 2023-09-15 NOTE — Telephone Encounter (Signed)
 Patient notified VIA phone. Dm/cma

## 2023-09-27 DIAGNOSIS — H353190 Nonexudative age-related macular degeneration, unspecified eye, stage unspecified: Secondary | ICD-10-CM | POA: Diagnosis not present

## 2023-09-27 DIAGNOSIS — H40053 Ocular hypertension, bilateral: Secondary | ICD-10-CM | POA: Diagnosis not present

## 2023-09-27 DIAGNOSIS — H5231 Anisometropia: Secondary | ICD-10-CM | POA: Diagnosis not present

## 2023-09-27 DIAGNOSIS — Z794 Long term (current) use of insulin: Secondary | ICD-10-CM | POA: Diagnosis not present

## 2023-09-27 DIAGNOSIS — E113392 Type 2 diabetes mellitus with moderate nonproliferative diabetic retinopathy without macular edema, left eye: Secondary | ICD-10-CM | POA: Diagnosis not present

## 2023-10-04 DIAGNOSIS — E1122 Type 2 diabetes mellitus with diabetic chronic kidney disease: Secondary | ICD-10-CM | POA: Diagnosis not present

## 2023-10-05 DIAGNOSIS — E785 Hyperlipidemia, unspecified: Secondary | ICD-10-CM | POA: Diagnosis not present

## 2023-10-05 DIAGNOSIS — Z87891 Personal history of nicotine dependence: Secondary | ICD-10-CM | POA: Diagnosis not present

## 2023-10-05 DIAGNOSIS — Z992 Dependence on renal dialysis: Secondary | ICD-10-CM | POA: Diagnosis not present

## 2023-10-05 DIAGNOSIS — N184 Chronic kidney disease, stage 4 (severe): Secondary | ICD-10-CM | POA: Diagnosis not present

## 2023-10-05 DIAGNOSIS — E1122 Type 2 diabetes mellitus with diabetic chronic kidney disease: Secondary | ICD-10-CM | POA: Diagnosis not present

## 2023-10-05 DIAGNOSIS — Z01810 Encounter for preprocedural cardiovascular examination: Secondary | ICD-10-CM | POA: Diagnosis not present

## 2023-10-05 DIAGNOSIS — I120 Hypertensive chronic kidney disease with stage 5 chronic kidney disease or end stage renal disease: Secondary | ICD-10-CM | POA: Diagnosis not present

## 2023-10-05 DIAGNOSIS — N186 End stage renal disease: Secondary | ICD-10-CM | POA: Diagnosis not present

## 2023-10-11 ENCOUNTER — Ambulatory Visit (INDEPENDENT_AMBULATORY_CARE_PROVIDER_SITE_OTHER): Payer: Medicare HMO | Admitting: Family Medicine

## 2023-10-11 ENCOUNTER — Other Ambulatory Visit: Payer: Self-pay | Admitting: Family Medicine

## 2023-10-11 ENCOUNTER — Encounter: Payer: Self-pay | Admitting: Family Medicine

## 2023-10-11 VITALS — BP 144/86 | HR 75 | Temp 98.7°F | Ht 69.0 in | Wt 323.6 lb

## 2023-10-11 DIAGNOSIS — N184 Chronic kidney disease, stage 4 (severe): Secondary | ICD-10-CM | POA: Diagnosis not present

## 2023-10-11 DIAGNOSIS — Z7984 Long term (current) use of oral hypoglycemic drugs: Secondary | ICD-10-CM

## 2023-10-11 DIAGNOSIS — I5022 Chronic systolic (congestive) heart failure: Secondary | ICD-10-CM

## 2023-10-11 DIAGNOSIS — N2581 Secondary hyperparathyroidism of renal origin: Secondary | ICD-10-CM

## 2023-10-11 DIAGNOSIS — Z794 Long term (current) use of insulin: Secondary | ICD-10-CM | POA: Diagnosis not present

## 2023-10-11 DIAGNOSIS — I10 Essential (primary) hypertension: Secondary | ICD-10-CM | POA: Diagnosis not present

## 2023-10-11 DIAGNOSIS — F329 Major depressive disorder, single episode, unspecified: Secondary | ICD-10-CM

## 2023-10-11 DIAGNOSIS — E1140 Type 2 diabetes mellitus with diabetic neuropathy, unspecified: Secondary | ICD-10-CM

## 2023-10-11 HISTORY — DX: Secondary hyperparathyroidism of renal origin: N25.81

## 2023-10-11 HISTORY — DX: Major depressive disorder, single episode, unspecified: F32.9

## 2023-10-11 MED ORDER — HYDRALAZINE HCL 25 MG PO TABS: 75.0000 mg | ORAL_TABLET | Freq: Three times a day (TID) | ORAL | 0 refills | Status: DC

## 2023-10-11 MED ORDER — ISOSORBIDE MONONITRATE ER 30 MG PO TB24: 30.0000 mg | ORAL_TABLET | Freq: Every day | ORAL | 0 refills | Status: DC

## 2023-10-11 MED ORDER — SERTRALINE HCL 25 MG PO TABS: 25.0000 mg | ORAL_TABLET | Freq: Every day | ORAL | 3 refills | Status: DC

## 2023-10-11 MED ORDER — BUMETANIDE 1 MG PO TABS: 2.0000 mg | ORAL_TABLET | Freq: Two times a day (BID) | ORAL | 0 refills | Status: DC

## 2023-10-11 MED ORDER — CALCIUM ACETATE (PHOS BINDER) 667 MG PO CAPS: 667.0000 mg | ORAL_CAPSULE | Freq: Three times a day (TID) | ORAL | 0 refills | Status: DC

## 2023-10-11 MED ORDER — CARVEDILOL 25 MG PO TABS: 25.0000 mg | ORAL_TABLET | Freq: Two times a day (BID) | ORAL | 3 refills | Status: DC

## 2023-10-11 NOTE — Assessment & Plan Note (Signed)
 Last eGFR was ~ 13, consistent with Stage 5 CKD (kidney failure). Nephrology is working to get an A-V shunt placed in preparation for dialysis.

## 2023-10-11 NOTE — Assessment & Plan Note (Signed)
 Peter Terry describes overall depressive feelings. I will plant o refer him for counseling, esp. in light of his multiple chronic diseases. I will also start him on sertraline 25 mg a day. I will see him back in 6 weeks to reassess how he is doing.

## 2023-10-11 NOTE — Assessment & Plan Note (Signed)
 Partially compensated. Weight is up 11 lbs from his last visit. He is working with cardiology and nephrology concerning management. Continue carvedilol 25 mg bid, bumetanide (Bumex) 2 mg twice daily, and metolazone 2.5 mg every other day.

## 2023-10-11 NOTE — Assessment & Plan Note (Signed)
 Blood pressure remains high. Continue carvedilol 25 mg bid and hydralazine 75 mg TID. Patient is engaged with cardiology and nephrology.

## 2023-10-11 NOTE — Assessment & Plan Note (Signed)
 Diabetes remains in poor control. I will have him increase his Toujeo to 55 units bid and his Novulin 70/30 to 55 units bid. I iwll refer him to endocrinology for assistance in management, esp. in light of pending dialysis.

## 2023-10-11 NOTE — Progress Notes (Signed)
 [facility] [address] [place] [address] Dept: [phone] Dept Fax: [phone]  Chronic Care Office Visit  Subjective:    Patient ID: Peter Terry, male    DOB: 09-26-1955, 68 y.o..   MRN: [id number]  Chief Complaint  Patient presents with   Follow-up    Follow meds.    History of Present Illness:  Patient is in today for reassessment of chronic medical issues.  Peter Terry has a history of heart failure with preserved ejection fraction. His last admission for an acute exacerbation of his heart failure was in January. He underwent controlled diuresis, dropping 11 liters of fluid. This has been complicated by acute renal failure on top of stage 4 CKD. He also has essential hypertension, COPD, OSA, Type 2 diabetes, morbid obesity, anemia of chronic disease, and chronic stasis dermatitis with cellulitis. His HF is currently managed on carvedilol 25 mg bid and bumetanide (Bumex) 2 mg twice daily with metolazone 2.5 mg every other day for heart failure management. He is on  hydralazine 25 mg 3 tabs TID and isosorbide mononitrate 30 mg daily for BP management.   Peter Terry has stage 4 chronic kidney disease. He notes he is going to have a AV shunt placed on 3/17 in preparation for eventual dialysis.   Peter Terry has chronic lower leg edema and chronic venous stasis changes. He is keepign ACE wraps in place and finds this does help his swelling.   Peter Terry has a Type 2 diabetes diagnosed 20+ years ago. He is currently managed on metformin 1,000 mg bid, Basaglar insulin 50 units twice daily and Humalog 75/25 50 units twice daily.  His CGM shows a 14-day average blood sugar still in the mid 200s..  Past Medical History: Patient Active Problem List   Diagnosis Date Noted   Reactive depression 10/11/2023   Cellulitis of right lower extremity 09/01/2023   Anemia of chronic disease 09/01/2023   Obesity, class 3 09/01/2023   Acute renal  failure superimposed on stage 4 chronic kidney disease (HCC) 08/26/2023   Acute on chronic diastolic CHF (congestive heart failure) (HCC) 08/25/2023   Chronic right hip pain 07/13/2023   Venous stasis ulcer of left calf limited to breakdown of skin without varicose veins (HCC) 07/13/2023   Dyspnea on exertion 05/02/2023   Chronic kidney disease, stage 4 (severe) (HCC) 03/31/2023   Obstructive sleep apnea 03/31/2023   GERD (gastroesophageal reflux disease) 03/31/2023   Edema of both lower legs 03/31/2023   Venous stasis dermatitis 03/31/2023   Tinea pedis 03/31/2023   Heart Failure with Preserved EF (HCC) 03/31/2023   Diabetic peripheral neuropathy (HCC) 03/31/2023   Mixed hyperlipidemia 03/30/2023   Essential hypertension 03/30/2023   COPD (chronic obstructive pulmonary disease) (HCC) 03/30/2023   Type 2 diabetes mellitus with diabetic neuropathy, unspecified (HCC) 11/04/2022   Hypomagnesemia 11/04/2022   Insulin long-term use (HCC) 08/21/2020   Morbid obesity with body mass index (BMI) of 50.0 to 59.9 in adult [facility]) 07/25/2020   Retinal tear 02/20/2013   Past Surgical History:  Procedure Laterality Date   APPENDECTOMY     LUMBAR DISC SURGERY     L4-L5   Family History  Problem Relation Age of Onset   Cancer Mother    Diabetes Mother    Kidney disease Mother    Heart disease Father    Cancer Father        Colon   Kidney disease Father    Diabetes Father  Diabetes Brother    Heart disease Paternal Aunt    Outpatient Medications Prior to Visit  Medication Sig Dispense Refill   albuterol (VENTOLIN HFA) 108 (90 Base) MCG/ACT inhaler Inhale 2 puffs into the lungs every 4 (four) hours as needed for wheezing or shortness of breath.     atorvastatin (LIPITOR) 80 MG tablet Take 1 tablet (80 mg total) by mouth at bedtime. 90 tablet 3   B-D UF III MINI PEN NEEDLES 31G X 5 MM MISC Inject into the skin daily.     gabapentin (NEURONTIN) 100 MG capsule Take 1 capsule (100 mg total)  by mouth 3 (three) times daily as needed (neuropathy pain). 90 capsule 3   insulin aspart protamine - aspart (NOVOLOG MIX 70/30 FLEXPEN) (70-30) 100 UNIT/ML FlexPen Inject 45 Units into the skin 2 (two) times daily with a meal. 30 mL 5   Insulin Glargine (BASAGLAR KWIKPEN) 100 UNIT/ML Inject 45 Units into the skin 2 (two) times daily. 75 mL 3   metolazone (ZAROXOLYN) 2.5 MG tablet Take 1 tablet (2.5 mg total) by mouth every other day. 45 tablet 3   omeprazole (PRILOSEC) 20 MG capsule Take 1 capsule (20 mg total) by mouth daily. 90 capsule 3   potassium chloride SA (KLOR-CON M) 20 MEQ tablet Take 1 tablet (20 mEq total) by mouth 2 (two) times daily. 10 tablet 0   traMADol (ULTRAM) 50 MG tablet Take 1 tablet (50 mg total) by mouth every 8 (eight) hours as needed. 30 tablet 2   bumetanide (BUMEX) 1 MG tablet Take 2 tablets (2 mg total) by mouth 2 (two) times daily. 120 tablet 0   calcium acetate (PHOSLO) 667 MG capsule Take 1 capsule (667 mg total) by mouth 3 (three) times daily with meals. 90 capsule 0   carvedilol (COREG) 25 MG tablet Take 1 tablet (25 mg total) by mouth 2 (two) times daily. 90 tablet 3   hydrALAZINE (APRESOLINE) 25 MG tablet Take 3 tablets (75 mg total) by mouth 3 (three) times daily. 270 tablet 0   isosorbide mononitrate (IMDUR) 30 MG 24 hr tablet Take 1 tablet (30 mg total) by mouth daily. 30 tablet 0   No facility-administered medications prior to visit.   No Known Allergies Objective:   Today's Vitals   10/11/23 1446  BP: (!) 144/86  Pulse: 75  Temp: 98.7 F (37.1 C)  TempSrc: Temporal  SpO2: 97%  Weight: (!) 323 lb 9.6 oz (146.8 kg)  Height: 5\' 9"  (1.753 m)   Body mass index is 47.79 kg/m.   General: Well developed, well nourished. No acute distress. Lungs: Clear to auscultation bilaterally. No wheezing, rales or rhonchi. CV: RRR without murmurs or rubs. Pulses 2+ bilaterally. Psych: Alert and oriented. Normal mood and affect.  Health Maintenance Due  Topic  Date Due   Medicare Annual Wellness (AWV)  Never done   OPHTHALMOLOGY EXAM  Never done   Hepatitis C Screening  Never done   DTaP/Tdap/Td (1 - Tdap) Never done   Zoster Vaccines- Shingrix (1 of 2) Never done   HEMOGLOBIN A1C  10/01/2023   Lab Results Component Ref Range & Units 4 wk ago  Sodium 136 - 145 mmol/L 141  Potassium 3.5 - 5.1 mmol/L 3.5  Chloride 98 - 107 mmol/L 103  CO2 21 - 31 mmol/L 26  Anion Gap 6 - 14 mmol/L 12  Glucose, Random 70 - 99 mg/dL 69 Low   Blood Urea Nitrogen (BUN) 7 - 25 mg/dL 90 High  Creatinine 0.70 - 1.30 mg/dL 3.24 High   eGFR >40 NU/UVO/5.36U4 13 Low   Albumin 3.5 - 5.7 g/dL 3.6  Calcium 8.6 - 40.3 mg/dL 9.3  Phosphorus 2.5 - 5.0 mg/dL 4  BUN/Creatinine Ratio 10.0 - 20.0 18.6   Assessment & Plan:   Problem List Items Addressed This Visit       Cardiovascular and Mediastinum   Essential hypertension   Blood pressure remains high. Continue carvedilol 25 mg bid and hydralazine 75 mg TID. Patient is engaged with cardiology and nephrology.       Relevant Medications   bumetanide (BUMEX) 1 MG tablet   hydrALAZINE (APRESOLINE) 25 MG tablet   isosorbide mononitrate (IMDUR) 30 MG 24 hr tablet   carvedilol (COREG) 25 MG tablet   Heart Failure with Preserved EF (HCC)   Partially compensated. Weight is up 11 lbs from his last visit. He is working with cardiology and nephrology concerning management. Continue carvedilol 25 mg bid, bumetanide (Bumex) 2 mg twice daily, and metolazone 2.5 mg every other day.      Relevant Medications   bumetanide (BUMEX) 1 MG tablet   hydrALAZINE (APRESOLINE) 25 MG tablet   isosorbide mononitrate (IMDUR) 30 MG 24 hr tablet   carvedilol (COREG) 25 MG tablet     Endocrine   Type 2 diabetes mellitus with diabetic neuropathy, unspecified (HCC) - Primary   Diabetes remains in poor control. I will have him increase his Toujeo to 55 units bid and his Novulin 70/30 to 55 units bid. I iwll refer him to  endocrinology for assistance in management, esp. in light of pending dialysis.      Relevant Orders   Glucose, random   Hemoglobin A1c   Ambulatory referral to Endocrinology     Genitourinary   Chronic kidney disease, stage 4 (severe) (HCC)   Last eGFR was ~ 13, consistent with Stage 5 CKD (kidney failure). Nephrology is working to get an A-V shunt placed in preparation for dialysis.      Relevant Medications   calcium acetate (PHOSLO) 667 MG capsule     Other   Reactive depression   Mr. Jindra describes overall depressive feelings. I will plant o refer him for counseling, esp. in light of his multiple chronic diseases. I will also start him on sertraline 25 mg a day. I will see him back in 6 weeks to reassess how he is doing.      Relevant Medications   sertraline (ZOLOFT) 25 MG tablet   Other Relevant Orders   Ambulatory referral to Psychology    Return in about 6 weeks (around 11/22/2023) for Reassessment.   [name], MD

## 2023-10-12 ENCOUNTER — Other Ambulatory Visit

## 2023-10-12 DIAGNOSIS — Z794 Long term (current) use of insulin: Secondary | ICD-10-CM | POA: Diagnosis not present

## 2023-10-12 DIAGNOSIS — E1140 Type 2 diabetes mellitus with diabetic neuropathy, unspecified: Secondary | ICD-10-CM | POA: Diagnosis not present

## 2023-10-12 LAB — GLUCOSE, RANDOM: Glucose, Bld: 128 mg/dL — ABNORMAL HIGH (ref 70–99)

## 2023-10-13 ENCOUNTER — Encounter: Payer: Self-pay | Admitting: Family Medicine

## 2023-10-13 LAB — HEMOGLOBIN A1C
Est. average glucose Bld gHb Est-mCnc: 217 mg/dL
Hgb A1c MFr Bld: 9.2 % — ABNORMAL HIGH (ref 4.8–5.6)

## 2023-10-23 ENCOUNTER — Other Ambulatory Visit: Payer: Self-pay | Admitting: Family Medicine

## 2023-10-23 DIAGNOSIS — N184 Chronic kidney disease, stage 4 (severe): Secondary | ICD-10-CM

## 2023-10-25 DIAGNOSIS — N186 End stage renal disease: Secondary | ICD-10-CM | POA: Diagnosis not present

## 2023-10-25 DIAGNOSIS — E1122 Type 2 diabetes mellitus with diabetic chronic kidney disease: Secondary | ICD-10-CM | POA: Diagnosis not present

## 2023-10-25 DIAGNOSIS — N184 Chronic kidney disease, stage 4 (severe): Secondary | ICD-10-CM | POA: Diagnosis not present

## 2023-10-25 DIAGNOSIS — I129 Hypertensive chronic kidney disease with stage 1 through stage 4 chronic kidney disease, or unspecified chronic kidney disease: Secondary | ICD-10-CM | POA: Diagnosis not present

## 2023-10-25 DIAGNOSIS — Z794 Long term (current) use of insulin: Secondary | ICD-10-CM | POA: Diagnosis not present

## 2023-10-26 ENCOUNTER — Other Ambulatory Visit: Payer: Self-pay | Admitting: Family Medicine

## 2023-10-26 DIAGNOSIS — Z794 Long term (current) use of insulin: Secondary | ICD-10-CM

## 2023-11-03 ENCOUNTER — Other Ambulatory Visit: Payer: Self-pay | Admitting: Family Medicine

## 2023-11-03 ENCOUNTER — Ambulatory Visit: Payer: Medicare HMO

## 2023-11-03 VITALS — BP 124/78 | HR 79 | Ht 69.0 in | Wt 330.0 lb

## 2023-11-03 DIAGNOSIS — E1122 Type 2 diabetes mellitus with diabetic chronic kidney disease: Secondary | ICD-10-CM | POA: Diagnosis not present

## 2023-11-03 DIAGNOSIS — F329 Major depressive disorder, single episode, unspecified: Secondary | ICD-10-CM

## 2023-11-03 DIAGNOSIS — I5032 Chronic diastolic (congestive) heart failure: Secondary | ICD-10-CM | POA: Diagnosis not present

## 2023-11-03 DIAGNOSIS — I5022 Chronic systolic (congestive) heart failure: Secondary | ICD-10-CM

## 2023-11-03 DIAGNOSIS — I10 Essential (primary) hypertension: Secondary | ICD-10-CM

## 2023-11-03 NOTE — Telephone Encounter (Signed)
 LOV  10/11/23 FOV  11/22/23  Please review and advise.  Thanks. Dm/cma

## 2023-11-03 NOTE — Assessment & Plan Note (Signed)
 Hypervolemic, NYHA class III. Adherent with dietary salt and fluid restrictions. Continue with Bumex 2 mg twice daily. Continue with metolazone 2.5 mg every other day but for the next 3 days take it once every day.  Return to clinic tentatively in 3 months.  Continue following up with his nephrologist as scheduled and anticipating initiation of dialysis relatively soon.  From cardiac standpoint he is on amlodipine 10 mg once daily Hydralazine 25 mg 3 times daily Imdur 30 mg once daily.  Not a candidate for SGLT2 inhibitors, spironolactone or Entresto.

## 2023-11-03 NOTE — Patient Instructions (Signed)
 Medication Instructions:  Your physician recommends that you continue on your current medications as directed. Please refer to the Current Medication list given to you today.  *If you need a refill on your cardiac medications before your next appointment, please call your pharmacy*   Lab Work: None Ordered If you have labs (blood work) drawn today and your tests are completely normal, you will receive your results only by: MyChart Message (if you have MyChart) OR A paper copy in the mail If you have any lab test that is abnormal or we need to change your treatment, we will call you to review the results.   Testing/Procedures: None Ordered   Follow-Up: At [facility], you and your health needs are our priority.  As part of our continuing mission to provide you with exceptional heart care, we have created designated Provider Care Teams.  These Care Teams include your primary Cardiologist (physician) and Advanced Practice Providers (APPs -  Physician Assistants and Nurse Practitioners) who all work together to provide you with the care you need, when you need it.  We recommend signing up for the patient portal called "MyChart".  Sign up information is provided on this After Visit Summary.  MyChart is used to connect with patients for Virtual Visits (Telemedicine).  Patients are able to view lab/test results, encounter notes, upcoming appointments, etc.  Non-urgent messages can be sent to your provider as well.   To learn more about what you can do with MyChart, go to [email or web address].    Your next appointment:   3 month follow up

## 2023-11-03 NOTE — Progress Notes (Signed)
 Cardiology Consultation:    Date:  11/03/2023   ID:  Peter Terry, DOB Jun 10, 1956, MRN [id number]  PCP:  [name], MD  Cardiologist:  [name], MD   Referring MD: [name], MD   No chief complaint on file.    ASSESSMENT AND PLAN:   [name] 68 year old male history of chronic diastolic heart failure with preserved ejection fraction, morbid obesity, hypertension, diabetes mellitus, hyperlipidemia, CKD stage IV, obstructive sleep apnea-unable to tolerate CPAP, chronic venous insufficiency with bilateral lower extremity edema.  Extremely difficult fluid management due to dietary nonadherence, underlying renal function and response to diuretics. Problem List Items Addressed This Visit     CHF (congestive heart failure) (HCC) - Primary   Hypervolemic, NYHA class III. Adherent with dietary salt and fluid restrictions. Continue with Bumex 2 mg twice daily. Continue with metolazone 2.5 mg every other day but for the next 3 days take it once every day.  Return to clinic tentatively in 3 months.  Continue following up with his nephrologist as scheduled and anticipating initiation of dialysis relatively soon.  From cardiac standpoint he is on amlodipine 10 mg once daily Hydralazine 25 mg 3 times daily Imdur 30 mg once daily.  Not a candidate for SGLT2 inhibitors, spironolactone or Entresto.      Relevant Medications   amLODipine (NORVASC) 10 MG tablet   losartan (COZAAR) 100 MG tablet  Return to clinic tentatively in 3 months.    History of Present Illness:    Peter Terry is a 68 y.o. male who is being seen today for follow-up visit. Last visit with me in the office was 08-23-2023. PCP is Rudd, Bertram Millard, MD.  After his last visit with me he was admitted to the hospital in [facility] and was inpatient between 08-25-2023 through 09-03-2023   He has history of chronic diastolic heart failure with preserved ejection fraction, morbid obesity,  hypertension, diabetes mellitus, hyperlipidemia, CKD stage IV, obstructive sleep apnea-unable to tolerate CPAP, chronic venous insufficiency with bilateral lower extremity edema.  Extremely difficult fluid management due to dietary nonadherence, underlying renal function and response to diuretics.  Continues to live at home with his father.  Ideal weight reportedly 307 pounds on prior discharge from the hospital 09-03-2023. Last office visit on January 13th with me his weight was 337 pounds. Recommended at last visit to increase Bumex to once a day 2 mg and continue taking metolazone 2.5 mg every other day.   This was increased to Bumex 2 mg twice daily and metolazone 2.5 mg every other day at discharge from the hospital on January 24.  Since then he has had outpatient AV fistula procedure done.  Pending further follow-up with nephrologist. He has gradually gained weight and now 330 pounds today.  Mentions his breathing is not as bad as he was at the last office visit but has definitely worse compared to the discharge from the hospital with the weight gain. Denies any other cardiac symptoms at this time.  Denies any blood in urine or stools.   Blood work from 10-25-2023 hemoglobin 10.1, hematocrit 30.8, platelets 214, WBC 11.1 BUN 81, creatinine 3.84, sodium 138, potassium 3.7. In comparison renal panel from 09-13-2023 BUN was 19 and creatinine was 4.85 with sodium 141 and potassium 3.5.   Past Medical History:  Diagnosis Date   Acute heart failure with preserved ejection fraction (HFpEF) (HCC) 05/02/2023   Acute on chronic diastolic CHF (congestive heart failure) (HCC) 08/25/2023  Acute renal failure superimposed on stage 4 chronic kidney disease (HCC) 08/26/2023   Anemia of chronic disease 09/01/2023   Cellulitis of right lower extremity 09/01/2023   CHF (congestive heart failure) (HCC)    Chronic kidney disease, stage 4 (severe) (HCC) 03/31/2023   Chronic right hip pain 07/13/2023    COPD (chronic obstructive pulmonary disease) (HCC) 03/30/2023   Diabetic peripheral neuropathy (HCC) 03/31/2023   Dyspnea on exertion 05/02/2023   Edema of both lower legs 03/31/2023   Essential hypertension 03/30/2023   GERD (gastroesophageal reflux disease) 03/31/2023   Heart Failure with Preserved EF (HCC) 03/31/2023   Hypomagnesemia 11/04/2022   Insulin long-term use (HCC) 08/21/2020   Iron deficiency anemia 03/31/2022   Mixed hyperlipidemia 03/30/2023   Morbid obesity with BMI of 45.0-49.9, adult (HCC) 07/25/2020   Morbid obesity with body mass index (BMI) of 50.0 to 59.9 in adult (HCC) 07/25/2020   Multiple open wounds of lower extremity 05/02/2023   Obesity, class 3 09/01/2023   Obstructive sleep apnea 03/31/2023   Reactive depression 10/11/2023   Retinal tear 02/20/2013   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/11/2023   Tinea pedis 03/31/2023   Type 2 diabetes mellitus with diabetic neuropathy, unspecified (HCC) 11/04/2022   Type 2 diabetes mellitus with hyperglycemia, with long-term current use of insulin (HCC) 05/02/2023   Type 2 diabetes mellitus with stage 4 chronic kidney disease, with long-term current use of insulin (HCC) 11/04/2022   Venous stasis dermatitis 03/31/2023   Venous stasis ulcer of left calf limited to breakdown of skin without varicose veins (HCC) 07/13/2023    Past Surgical History:  Procedure Laterality Date   APPENDECTOMY     LUMBAR DISC SURGERY     L4-L5    Current Medications: Current Meds  Medication Sig   albuterol (VENTOLIN HFA) 108 (90 Base) MCG/ACT inhaler Inhale 2 puffs into the lungs every 4 (four) hours as needed for wheezing or shortness of breath.   amLODipine (NORVASC) 10 MG tablet Take 10 mg by mouth every morning.   atorvastatin (LIPITOR) 80 MG tablet Take 1 tablet (80 mg total) by mouth at bedtime.   B-D UF III MINI PEN NEEDLES 31G X 5 MM MISC Inject into the skin daily.   bumetanide (BUMEX) 1 MG tablet TAKE 2  TABLETS (2 MG TOTAL) BY MOUTH 2 (TWO) TIMES DAILY.   carvedilol (COREG) 25 MG tablet Take 1 tablet (25 mg total) by mouth 2 (two) times daily.   gabapentin (NEURONTIN) 100 MG capsule Take 1 capsule (100 mg total) by mouth 3 (three) times daily as needed (neuropathy pain).   hydrALAZINE (APRESOLINE) 25 MG tablet TAKE 3 TABLETS (75 MG TOTAL) BY MOUTH 3 TIMES A DAY   [EXPIRED] HYDROcodone-acetaminophen (NORCO/VICODIN) 5-325 MG tablet Take 1 tablet by mouth every 6 (six) hours as needed for moderate pain (pain score 4-6).   HYDROcodone-acetaminophen (NORCO/VICODIN) 5-325 MG tablet Take 1 tablet by mouth every 6 (six) hours as needed for moderate pain (pain score 4-6).   insulin aspart protamine - aspart (NOVOLOG MIX 70/30 FLEXPEN) (70-30) 100 UNIT/ML FlexPen Inject 45 Units into the skin 2 (two) times daily with a meal.   insulin glargine (LANTUS) 100 UNIT/ML Solostar Pen Inject 55 Units into the skin 2 (two) times daily.   insulin lispro protamine-lispro (HUMALOG 75/25 MIX) (75-25) 100 UNIT/ML SUSP injection Inject 55 Units into the skin 2 (two) times daily before a meal.   isosorbide mononitrate (IMDUR) 30 MG 24 hr tablet TAKE 1 TABLET BY  MOUTH EVERY DAY   losartan (COZAAR) 100 MG tablet Take 100 mg by mouth daily.   metolazone (ZAROXOLYN) 2.5 MG tablet Take 1 tablet (2.5 mg total) by mouth every other day.   omeprazole (PRILOSEC) 20 MG capsule Take 1 capsule (20 mg total) by mouth daily.   sertraline (ZOLOFT) 25 MG tablet TAKE 1 TABLET (25 MG TOTAL) BY MOUTH DAILY.   traMADol (ULTRAM) 50 MG tablet Take 1 tablet (50 mg total) by mouth every 8 (eight) hours as needed.     Allergies:   Patient has no known allergies.   Social History   Socioeconomic History   Marital status: Single    Spouse name: Not on file   Number of children: 1   Years of education: Not on file   Highest education level: GED or equivalent  Occupational History   Occupation: Air cabin crew Delivery  Tobacco Use   Smoking  status: Former    Types: Cigarettes   Smokeless tobacco: Not on file  Vaping Use   Vaping status: Never Used  Substance and Sexual Activity   Alcohol use: No   Drug use: No   Sexual activity: Not Currently  Other Topics Concern   Not on file  Social History Narrative   Not on file   Social Drivers of Health   Financial Resource Strain: Medium Risk (09/09/2023)   Overall Financial Resource Strain (CARDIA)    Difficulty of Paying Living Expenses: Somewhat hard  Food Insecurity: Food Insecurity Present (09/09/2023)   Hunger Vital Sign    Worried About Running Out of Food in the Last Year: Sometimes true    Ran Out of Food in the Last Year: Sometimes true  Transportation Needs: No Transportation Needs (09/09/2023)   PRAPARE - Administrator, Civil Service (Medical): No    Lack of Transportation (Non-Medical): No  Physical Activity: Insufficiently Active (09/09/2023)   Exercise Vital Sign    Days of Exercise per Week: 1 day    Minutes of Exercise per Session: 10 min  Stress: No Stress Concern Present (09/09/2023)   [employer] of Occupational Health - Occupational Stress Questionnaire    Feeling of Stress : Only a little  Social Connections: Socially Isolated (09/09/2023)   Social Connection and Isolation Panel [NHANES]    Frequency of Communication with Friends and Family: Never    Frequency of Social Gatherings with Friends and Family: Never    Attends Religious Services: Never    Diplomatic Services operational officer: No    Attends Engineer, structural: Never    Marital Status: Never married     Family History: The patient's family history includes Cancer in his father and mother; Diabetes in his brother, father, and mother; Heart disease in his father and paternal aunt; Kidney disease in his father and mother. ROS:   Please see the history of present illness.    All 14 point review of systems negative except as described per history of present  illness.  EKGs/Labs/Other Studies Reviewed:    The following studies were reviewed today:   EKG:       Recent Labs: 08/25/2023: B Natriuretic Peptide 106.6 08/26/2023: Magnesium 1.8 08/28/2023: ALT 17 09/13/2023: BUN 90; Creatinine, Ser 4.79; Hemoglobin 9.6; Platelets 234.0; Potassium 3.1; Sodium 139  Recent Lipid Panel    Component Value Date/Time   CHOL 165 03/31/2023 1118   TRIG 220.0 (H) 03/31/2023 1118   HDL 30.50 (L) 03/31/2023 1118   CHOLHDL 5 03/31/2023 1118  VLDL 44.0 (H) 03/31/2023 1118   LDLDIRECT 106.0 03/31/2023 1118    Physical Exam:    VS:  BP 124/78   Pulse 79   Ht 5\' 9"  (1.753 m)   Wt (!) 330 lb (149.7 kg)   SpO2 95%   BMI 48.73 kg/m     Wt Readings from Last 3 Encounters:  11/03/23 (!) 330 lb (149.7 kg)  10/11/23 (!) 323 lb 9.6 oz (146.8 kg)  09/13/23 (!) 312 lb 6.4 oz (141.7 kg)     GENERAL:  Well nourished, well developed in no acute distress NECK: No JVD; No carotid bruits CARDIAC: RRR, S1 and S2 present, no murmurs, no rubs, no gallops CHEST: Mild bilateral wheezing with reduced air entry.  Mild basilar crackles present. Extremities: Marked bilateral pedal edema extending up to the thighs.  Pulses bilaterally symmetric with radial 2+ and dorsalis pedis 1+.  Left forearm scar from recent AV fistula placement. NEUROLOGIC:  Alert and oriented x 3  Medication Adjustments/Labs and Tests Ordered: Current medicines are reviewed at length with the patient today.  Concerns regarding medicines are outlined above.  No orders of the defined types were placed in this encounter.  No orders of the defined types were placed in this encounter.   Signed, Cecille Amsterdam, MD, MPH, [facility]. 11/03/2023 2:32 PM    [facility]

## 2023-11-11 DIAGNOSIS — I129 Hypertensive chronic kidney disease with stage 1 through stage 4 chronic kidney disease, or unspecified chronic kidney disease: Secondary | ICD-10-CM | POA: Diagnosis not present

## 2023-11-11 DIAGNOSIS — E1122 Type 2 diabetes mellitus with diabetic chronic kidney disease: Secondary | ICD-10-CM | POA: Diagnosis not present

## 2023-11-11 DIAGNOSIS — D631 Anemia in chronic kidney disease: Secondary | ICD-10-CM | POA: Diagnosis not present

## 2023-11-11 DIAGNOSIS — N184 Chronic kidney disease, stage 4 (severe): Secondary | ICD-10-CM | POA: Diagnosis not present

## 2023-11-11 DIAGNOSIS — Z794 Long term (current) use of insulin: Secondary | ICD-10-CM | POA: Diagnosis not present

## 2023-11-11 DIAGNOSIS — Z6841 Body Mass Index (BMI) 40.0 and over, adult: Secondary | ICD-10-CM | POA: Diagnosis not present

## 2023-11-11 DIAGNOSIS — R600 Localized edema: Secondary | ICD-10-CM | POA: Diagnosis not present

## 2023-11-11 DIAGNOSIS — N179 Acute kidney failure, unspecified: Secondary | ICD-10-CM | POA: Diagnosis not present

## 2023-11-16 ENCOUNTER — Telehealth: Payer: Self-pay

## 2023-11-16 NOTE — Telephone Encounter (Signed)
 Patient was identified as falling into the [place] Measure - Diabetes.   Patient was: Referred to Diabetes Management..  Patient was: Appointment scheduled for lab or office visit for A1c.  On 11/22/2023 with Dr. Veto Kemps MD   Last OV 10/11/2023 Last A1c 10/12/2023 9.2   2 month follow up (around 12/11/2023)

## 2023-11-19 DIAGNOSIS — I770 Arteriovenous fistula, acquired: Secondary | ICD-10-CM | POA: Insufficient documentation

## 2023-11-22 ENCOUNTER — Telehealth: Payer: Self-pay

## 2023-11-22 ENCOUNTER — Encounter: Payer: Self-pay | Admitting: Family Medicine

## 2023-11-22 ENCOUNTER — Ambulatory Visit (INDEPENDENT_AMBULATORY_CARE_PROVIDER_SITE_OTHER): Admitting: Family Medicine

## 2023-11-22 VITALS — BP 138/66 | HR 84 | Temp 98.6°F | Ht 69.0 in | Wt 335.2 lb

## 2023-11-22 DIAGNOSIS — R600 Localized edema: Secondary | ICD-10-CM | POA: Diagnosis not present

## 2023-11-22 DIAGNOSIS — E1140 Type 2 diabetes mellitus with diabetic neuropathy, unspecified: Secondary | ICD-10-CM

## 2023-11-22 DIAGNOSIS — I10 Essential (primary) hypertension: Secondary | ICD-10-CM

## 2023-11-22 DIAGNOSIS — Z794 Long term (current) use of insulin: Secondary | ICD-10-CM | POA: Diagnosis not present

## 2023-11-22 DIAGNOSIS — E782 Mixed hyperlipidemia: Secondary | ICD-10-CM | POA: Diagnosis not present

## 2023-11-22 DIAGNOSIS — N184 Chronic kidney disease, stage 4 (severe): Secondary | ICD-10-CM

## 2023-11-22 DIAGNOSIS — I5022 Chronic systolic (congestive) heart failure: Secondary | ICD-10-CM | POA: Diagnosis not present

## 2023-11-22 MED ORDER — INSULIN LISPRO PROT & LISPRO (75-25 MIX) 100 UNIT/ML ~~LOC~~ SUSP: 60.0000 [IU] | Freq: Two times a day (BID) | SUBCUTANEOUS | Status: DC

## 2023-11-22 MED ORDER — INSULIN GLARGINE 100 UNIT/ML SOLOSTAR PEN: 60.0000 [IU] | PEN_INJECTOR | Freq: Two times a day (BID) | SUBCUTANEOUS | 11 refills | Status: DC

## 2023-11-22 NOTE — Assessment & Plan Note (Signed)
 Likely multifactorial. Continue bumetanide and metolazone. Cardiology recently increased the metolazone dose.

## 2023-11-22 NOTE — Telephone Encounter (Signed)
 Can you please and thank you let me know where we stand for Endocrinology referral for patient?   Thanks.  Dm/cma

## 2023-11-22 NOTE — Assessment & Plan Note (Signed)
 Blood pressure remains high. Continue carvedilol 25 mg bid and hydralazine 75 mg TID. Patient is engaged with cardiology and nephrology. Pending starting dialysis.

## 2023-11-22 NOTE — Assessment & Plan Note (Signed)
 Last eGFR was ~ 17, consistent with Stage 5 CKD (kidney failure). A-V shunt placed in preparation for dialysis.

## 2023-11-22 NOTE — Assessment & Plan Note (Signed)
 Diabetes remains in poor control, though his blood sugars are trending down a small amount. I will have him increase his Lantus to 60 units bid and his Humalog 70/30 to 60 units bid. I had referred him to endocrinology 6 weeks ago, but he does not appear to have been scheduled. I will have my MA check with the schedulers regarding this. I will also involve the [facility] team to provide Peter Terry with more support, including assistance with meds, coordination of care, and counseling regarding his health issues.

## 2023-11-22 NOTE — Assessment & Plan Note (Signed)
 Partially compensated. Weight is up 12 lbs from his last visit. He is working with cardiology and nephrology concerning management. Continue carvedilol 25 mg bid, bumetanide (Bumex) 2 mg twice daily, and metolazone 5 mg every other day.

## 2023-11-22 NOTE — Progress Notes (Signed)
 [facility] [address] [place] [address] Dept: [phone] Dept Fax: [phone]  Chronic Care Office Visit  Subjective:    Patient ID: Peter Terry, male    DOB: 02/20/1956, 68 y.o..   MRN: [id number]  Chief Complaint  Patient presents with   Follow-up    6 week f/u.  C/o having SOB.  BS average 200 - 300.      History of Present Illness:  Patient is in today for reassessment of chronic medical issues.  Peter Terry has a history of heart failure with preserved ejection fraction. This has been complicated by acute renal failure on top of stage 4 CKD. He also has essential hypertension, COPD, OSA, Type 2 diabetes, morbid obesity, anemia of chronic disease, and chronic stasis dermatitis with cellulitis. His HF is currently managed on carvedilol 25 mg bid and bumetanide (Bumex) 2 mg twice daily with metolazone 5 mg every other day for heart failure management. He is on  hydralazine 25 mg 3 tabs TID and isosorbide mononitrate 30 mg daily for BP management.    Peter Terry has stage 4 chronic kidney disease. He recently had an AV shunt placed. His nephrologist is awaiting maturation of the shunt to be able to initiate dialysis. Peter Terry as an appointment with her nephrologist tomorrow.   Peter Terry has chronic lower leg edema and chronic venous stasis changes. He is keepign ACE wraps in place and finds this does help his swelling. His weight is up another 12 lbs. in the past month.   Peter Terry has a Type 2 diabetes diagnosed 20+ years ago. He is currently managed on metformin 1,000 mg bid, Insulin glargine (Lantus) 55 units twice daily and Humalog 75/25 55 units twice daily.  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   Reactive depression 10/11/2023   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/11/2023   Cellulitis of right lower extremity  09/01/2023   Anemia of chronic disease 09/01/2023   Obesity, class 3 09/01/2023   Acute renal failure superimposed on stage 4 chronic kidney disease (HCC) 08/26/2023   Chronic right hip pain 07/13/2023   Venous stasis ulcer of left calf limited to breakdown of skin without varicose veins (HCC) 07/13/2023   CHF (congestive heart failure) (HCC)    Dyspnea on exertion 05/02/2023   Chronic kidney disease, stage 4 (severe) (HCC) 03/31/2023   Obstructive sleep apnea 03/31/2023   GERD (gastroesophageal reflux disease) 03/31/2023   Edema of both lower legs 03/31/2023   Venous stasis dermatitis 03/31/2023   Tinea pedis 03/31/2023   Heart Failure with Preserved EF (HCC) 03/31/2023   Diabetic peripheral neuropathy (HCC) 03/31/2023   Mixed hyperlipidemia 03/30/2023   Essential hypertension 03/30/2023   COPD (chronic obstructive pulmonary disease) (HCC) 03/30/2023   Type 2 diabetes mellitus with diabetic neuropathy, unspecified (HCC) 11/04/2022   Hypomagnesemia 11/04/2022   Insulin long-term use (HCC) 08/21/2020   Morbid obesity with body mass index (BMI) of 50.0 to 59.9 in adult [facility]) 07/25/2020   Retinal tear 02/20/2013   Past Surgical History:  Procedure Laterality Date   APPENDECTOMY     LUMBAR DISC SURGERY     L4-L5   Family History  Problem Relation Age of Onset   Cancer Mother    Diabetes Mother    Kidney disease Mother    Heart disease Father    Cancer Father  Colon   Kidney disease Father    Diabetes Father    Diabetes Brother    Heart disease Paternal Aunt    Outpatient Medications Prior to Visit  Medication Sig Dispense Refill   albuterol (VENTOLIN HFA) 108 (90 Base) MCG/ACT inhaler Inhale 2 puffs into the lungs every 4 (four) hours as needed for wheezing or shortness of breath.     amLODipine (NORVASC) 10 MG tablet Take 10 mg by mouth every morning.     atorvastatin (LIPITOR) 80 MG tablet Take 1 tablet (80 mg total) by mouth at bedtime. 90 tablet 3   B-D UF III  MINI PEN NEEDLES 31G X 5 MM MISC Inject into the skin daily.     bumetanide (BUMEX) 1 MG tablet TAKE 2 TABLETS (2 MG TOTAL) BY MOUTH 2 (TWO) TIMES DAILY. 360 tablet 3   carvedilol (COREG) 25 MG tablet Take 1 tablet (25 mg total) by mouth 2 (two) times daily. 90 tablet 3   hydrALAZINE (APRESOLINE) 25 MG tablet TAKE 3 TABLETS (75 MG TOTAL) BY MOUTH 3 TIMES A DAY 810 tablet 1   HYDROcodone-acetaminophen (NORCO/VICODIN) 5-325 MG tablet Take 1 tablet by mouth every 6 (six) hours as needed for moderate pain (pain score 4-6).     isosorbide mononitrate (IMDUR) 30 MG 24 hr tablet TAKE 1 TABLET BY MOUTH EVERY DAY 90 tablet 3   losartan (COZAAR) 100 MG tablet Take 100 mg by mouth daily.     metolazone (ZAROXOLYN) 2.5 MG tablet Take 1 tablet (2.5 mg total) by mouth every other day. 45 tablet 3   omeprazole (PRILOSEC) 20 MG capsule Take 1 capsule (20 mg total) by mouth daily. 90 capsule 3   sertraline (ZOLOFT) 25 MG tablet TAKE 1 TABLET (25 MG TOTAL) BY MOUTH DAILY. 90 tablet 3   traMADol (ULTRAM) 50 MG tablet Take 1 tablet (50 mg total) by mouth every 8 (eight) hours as needed. 30 tablet 2   insulin aspart protamine - aspart (NOVOLOG MIX 70/30 FLEXPEN) (70-30) 100 UNIT/ML FlexPen Inject 45 Units into the skin 2 (two) times daily with a meal. 30 mL 5   insulin glargine (LANTUS) 100 UNIT/ML Solostar Pen Inject 55 Units into the skin 2 (two) times daily. 15 mL 11   insulin lispro protamine-lispro (HUMALOG 75/25 MIX) (75-25) 100 UNIT/ML SUSP injection Inject 60 Units into the skin 2 (two) times daily before a meal.     gabapentin (NEURONTIN) 100 MG capsule Take 1 capsule (100 mg total) by mouth 3 (three) times daily as needed (neuropathy pain). 90 capsule 3   No facility-administered medications prior to visit.   No Known Allergies Objective:   Today's Vitals   11/22/23 1349  BP: 138/66  Pulse: 84  Temp: 98.6 F (37 C)  TempSrc: Temporal  SpO2: 97%  Weight: (!) 335 lb 3.2 oz (152 kg)  Height: 5\' 9"   (1.753 m)   Body mass index is 49.5 kg/m.   General: Well developed, well nourished. No acute distress. Extremities: Surgical wound in left arm healing. There is a palpable thrill present. Psych: Alert and oriented. Normal mood and affect.  Health Maintenance Due  Topic Date Due   Medicare Annual Wellness (AWV)  Never done   OPHTHALMOLOGY EXAM  Never done   Hepatitis C Screening  Never done   DTaP/Tdap/Td (1 - Tdap) Never done   Continuous Glucose Monitor 90-day: 260 30-day: 267 14-day: 259 7-day: 253    Assessment & Plan:   Problem List Items Addressed  This Visit       Cardiovascular and Mediastinum   Essential hypertension   Blood pressure remains high. Continue carvedilol 25 mg bid and hydralazine 75 mg TID. Patient is engaged with cardiology and nephrology. Pending starting dialysis.      Relevant Orders   AMB Referral [facility] Care Management   Heart Failure with Preserved EF (HCC) - Primary   Partially compensated. Weight is up 12 lbs from his last visit. He is working with cardiology and nephrology concerning management. Continue carvedilol 25 mg bid, bumetanide (Bumex) 2 mg twice daily, and metolazone 5 mg every other day.      Relevant Orders   AMB Referral [facility] Care Management     Endocrine   Type 2 diabetes mellitus with diabetic neuropathy, unspecified (HCC)   Diabetes remains in poor control, though his blood sugars are trending down a small amount. I will have him increase his Lantus to 60 units bid and his Humalog 70/30 to 60 units bid. I had referred him to endocrinology 6 weeks ago, but he does not appear to have been scheduled. I will have my MA check with the schedulers regarding this. I will also involve the [facility] team to provide Mr. Mcmanus with more support, including assistance with meds, coordination of care, and counseling regarding his health issues.      Relevant Medications   insulin glargine (LANTUS) 100 UNIT/ML Solostar Pen   insulin lispro  protamine-lispro (HUMALOG 75/25 MIX) (75-25) 100 UNIT/ML SUSP injection   Other Relevant Orders   AMB Referral [facility] Care Management     Genitourinary   Chronic kidney disease, stage 4 (severe) (HCC)   Last eGFR was ~ 17, consistent with Stage 5 CKD (kidney failure). A-V shunt placed in preparation for dialysis.      Relevant Orders   AMB Referral [facility] Care Management     Other   Edema of both lower legs   Likely multifactorial. Continue bumetanide and metolazone. Cardiology recently increased the metolazone dose.      Relevant Orders   AMB Referral [facility] Care Management   Mixed hyperlipidemia   Lipids are at goal. Continue atorvastatin 80 mg daily.       Return in about 7 weeks (around 01/10/2024) for Reassessment.   Graig Lawyer, MD

## 2023-11-22 NOTE — Assessment & Plan Note (Signed)
Lipids are at goal. Continue atorvastatin 80 mg daily.

## 2023-11-23 ENCOUNTER — Telehealth: Payer: Self-pay

## 2023-11-23 DIAGNOSIS — Z95828 Presence of other vascular implants and grafts: Secondary | ICD-10-CM | POA: Diagnosis not present

## 2023-11-23 DIAGNOSIS — N184 Chronic kidney disease, stage 4 (severe): Secondary | ICD-10-CM | POA: Diagnosis not present

## 2023-11-23 DIAGNOSIS — I770 Arteriovenous fistula, acquired: Secondary | ICD-10-CM | POA: Diagnosis not present

## 2023-11-23 NOTE — Progress Notes (Signed)
 Complex Care Management Note  Care Guide Note 11/23/2023 Name: Peter Terry MRN: [id number] DOB: 01-19-1956  Peter Terry is a 68 y.o. year old male who sees Rudd, Malcolm Scrivener, MD for primary care. I reached out to [name] by phone today to offer complex care management services.  [name] was given information about Complex Care Management services today including:   The Complex Care Management services include support from the care team which includes your Nurse Care Manager, Clinical Social Worker, or Pharmacist.  The Complex Care Management team is here to help remove barriers to the health concerns and goals most important to you. Complex Care Management services are voluntary, and the patient may decline or stop services at any time by request to their care team member.   Complex Care Management Consent Status: Patient agreed to services and verbal consent obtained.   Follow up plan:  Telephone appointment with complex care management team member scheduled for:  11/30/23, 12/03/23 & 12/07/23.   Encounter Outcome:  Patient Scheduled  [facility]  [facility], [facility] Care Management Assistant Direct Dial: [phone]  Fax: [phone]

## 2023-11-30 ENCOUNTER — Other Ambulatory Visit: Payer: Self-pay

## 2023-11-30 VITALS — Wt 332.0 lb

## 2023-11-30 NOTE — Addendum Note (Signed)
 Addended by: Clarnce Crow on: 11/30/2023 04:45 PM   Modules accepted: Orders

## 2023-11-30 NOTE — Patient Outreach (Signed)
 Complex Care Management   Visit Note  11/30/2023  Name:  Peter Terry MRN: [id number] DOB: 03/02/1956  Situation: Referral received for Complex Care Management related to Heart Failure, Diabetes with Complications, and Chronic Kidney Disease I obtained verbal consent from Patient.  Visit completed with patient  on the phone  Background:   Past Medical History:  Diagnosis Date   Acute heart failure with preserved ejection fraction (HFpEF) (HCC) 05/02/2023   Acute on chronic diastolic CHF (congestive heart failure) (HCC) 08/25/2023   Acute renal failure superimposed on stage 4 chronic kidney disease (HCC) 08/26/2023   Anemia of chronic disease 09/01/2023   Cellulitis of right lower extremity 09/01/2023   CHF (congestive heart failure) (HCC)    Chronic kidney disease, stage 4 (severe) (HCC) 03/31/2023   Chronic right hip pain 07/13/2023   COPD (chronic obstructive pulmonary disease) (HCC) 03/30/2023   Diabetic peripheral neuropathy (HCC) 03/31/2023   Dyspnea on exertion 05/02/2023   Edema of both lower legs 03/31/2023   Essential hypertension 03/30/2023   GERD (gastroesophageal reflux disease) 03/31/2023   Heart Failure with Preserved EF (HCC) 03/31/2023   Hypomagnesemia 11/04/2022   Insulin  long-term use (HCC) 08/21/2020   Iron  deficiency anemia 03/31/2022   Mixed hyperlipidemia 03/30/2023   Morbid obesity with BMI of 45.0-49.9, adult (HCC) 07/25/2020   Morbid obesity with body mass index (BMI) of 50.0 to 59.9 in adult (HCC) 07/25/2020   Multiple open wounds of lower extremity 05/02/2023   Obesity, class 3 09/01/2023   Obstructive sleep apnea 03/31/2023   Reactive depression 10/11/2023   Retinal tear 02/20/2013   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/11/2023   Tinea pedis 03/31/2023   Type 2 diabetes mellitus with diabetic neuropathy, unspecified (HCC) 11/04/2022   Type 2 diabetes mellitus with hyperglycemia, with long-term current use of insulin  (HCC)  05/02/2023   Type 2 diabetes mellitus with stage 4 chronic kidney disease, with long-term current use of insulin  (HCC) 11/04/2022   Venous stasis dermatitis 03/31/2023   Venous stasis ulcer of left calf limited to breakdown of skin without varicose veins (HCC) 07/13/2023    Assessment: Patient Reported Symptoms:  Cognitive Cognitive Status: Alert and oriented to person, place, and time      Neurological Neurological Review of Symptoms: Other: [place] Neurological Symptoms/Conditions [RPT]: diabetic neuropathy Neurological Management Strategies: Adequate rest, Medication therapy Neurological Self-Management Outcome: 3 (uncertain)  HEENT HEENT Symptoms Reported: No symptoms reported      Cardiovascular Cardiovascular Symptoms Reported: Swelling in legs or feet Does patient have uncontrolled Hypertension?: Yes Is patient checking Blood Pressure at home?: No (patient reports needs to look around his home to find BP Cuff) Cardiovascular Conditions: Heart failure, Hypertension, Valvular disease Cardiovascular Management Strategies: Fluid modification, Medication therapy, Routine screening, Adequate rest, Diet modification Weight: (!) 332 lb (150.6 kg)  Respiratory Respiratory Symptoms Reported: Shortness of breath Additional Respiratory Details: with exertion Respiratory Conditions: Shortness of breath  Endocrine Patient reports the following symptoms related to hypoglycemia or hyperglycemia : No symptoms reported Is patient diabetic?: Yes Is patient checking blood sugars at home?: Yes Endocrine Conditions: Diabetes Endocrine Management Strategies: Diet modification, Fluid modification, Medication therapy, Routine screening  Gastrointestinal Gastrointestinal Symptoms Reported: No symptoms reported      Genitourinary Genitourinary Symptoms Reported: No symptoms reported Genitourinary Conditions: Chronic kidney disease Genitourinary Management Strategies: Hemodialysis Hemodialysis  Schedule: fistual recently completed, cleared by vascular surgeon to start HD, RNCM will call Dr Alvie Jolly to clarify when and where to begin HD  Integumentary Integumentary  Symptoms Reported: Other Additional Integumentary Details: correction to note regarding fistual.  patient reports fluid leaking from lower extremities Skin Conditions: Other Other Skin Conditions: cellulitis Skin Management Strategies: Medication therapy, Routine screening, Fluid modification, Weight management, Dressing changes Skin Comment: not using compression stockings too difficult to wear, not wrapping legs with dressings  Musculoskeletal Musculoskelatal Symptoms Reviewed: Other Other Musculoskeletal Symptoms: right shoulder pain due to arthritis Musculoskeletal Conditions: Osteoarthritis Musculoskeletal Management Strategies: Adequate rest Falls in the past year?: No    Psychosocial Psychosocial Symptoms Reported: No symptoms reported Additional Psychological Details: patient reports "normal" depression about current medical condition, referral and appt with LCSW 12/03/23   Major Change/Loss/Stressor/Fears (CP): Medical condition, self Techniques to Cope with Loss/Stress/Change: Counseling (patient scheduled with LCSW on 12/03/23) Quality of Family Relationships: helpful, supportive Do you feel physically threatened by others?: No      11/22/2023    2:01 PM  Depression screen PHQ 2/9  Decreased Interest 3  Down, Depressed, Hopeless 1  PHQ - 2 Score 4  Altered sleeping 3  Tired, decreased energy 3  Change in appetite 3  Feeling bad or failure about yourself  1  Trouble concentrating 0  Moving slowly or fidgety/restless 0  Suicidal thoughts 0  PHQ-9 Score 14  Difficult doing work/chores Somewhat difficult    There were no vitals filed for this visit.  Medications Reviewed Today     Reviewed by Clarnce Crow, RN (Registered Nurse) on 11/30/23 at 1524  Med List Status: <None>   Medication Order Taking?  Sig Documenting Provider Last Dose Status Informant  albuterol  (VENTOLIN  HFA) 108 (90 Base) MCG/ACT inhaler [id number] Yes Inhale 2 puffs into the lungs every 4 (four) hours as needed for wheezing or shortness of breath. [provider] Taking Active Self, Pharmacy Records  amLODipine  (NORVASC ) 10 MG tablet [id number] Yes Take 10 mg by mouth every morning. [provider] Taking Active   atorvastatin  (LIPITOR) 80 MG tablet [id number] Yes Take 1 tablet (80 mg total) by mouth at bedtime. Graig Lawyer, MD Taking Active   B-D UF III MINI PEN NEEDLES 31G X 5 MM MISC [id number] Yes Inject into the skin daily. [provider] Taking Active   bumetanide  (BUMEX ) 1 MG tablet [id number] Yes TAKE 2 TABLETS (2 MG TOTAL) BY MOUTH 2 (TWO) TIMES DAILY. Graig Lawyer, MD Taking Active   carvedilol  (COREG ) 25 MG tablet [id number] Yes Take 1 tablet (25 mg total) by mouth 2 (two) times daily. Graig Lawyer, MD Taking Active   gabapentin  (NEURONTIN ) 100 MG capsule [id number] Yes Take 1 capsule (100 mg total) by mouth 3 (three) times daily as needed (neuropathy pain). Graig Lawyer, MD Taking Active   hydrALAZINE  (APRESOLINE ) 25 MG tablet [id number] Yes TAKE 3 TABLETS (75 MG TOTAL) BY MOUTH 3 TIMES A DAY Rudd, Malcolm Scrivener, MD Taking Active   HYDROcodone-acetaminophen  (NORCO/VICODIN) 5-325 MG tablet [id number] No Take 1 tablet by mouth every 6 (six) hours as needed for moderate pain (pain score 4-6).  Patient not taking: Reported on 11/30/2023   [provider] Not Taking Active            Med Note Burley Carpenter, Kiegan Macaraeg   Tue Nov 30, 2023  3:23 PM) Patient completed course no refills  insulin  glargine (LANTUS ) 100 UNIT/ML Solostar Pen [id number] Yes Inject 60 Units into the skin 2 (two) times daily. Graig Lawyer, MD Taking Active   insulin  lispro protamine-lispro (HUMALOG  75/25 MIX) (75-25) 100 UNIT/ML SUSP injection [id number] Yes  Inject 0.6 mLs (60 Units total) into the skin 2 (two) times daily  before a meal. Graig Lawyer, MD Taking Active   isosorbide  mononitrate (IMDUR ) 30 MG 24 hr tablet [id number] Yes TAKE 1 TABLET BY MOUTH EVERY DAY Graig Lawyer, MD Taking Active   losartan  (COZAAR ) 100 MG tablet [id number] Yes Take 100 mg by mouth daily. [provider] Taking Active   metolazone  (ZAROXOLYN ) 2.5 MG tablet [id number] Yes Take 1 tablet (2.5 mg total) by mouth every other day. Madireddy, Daymon Evans, MD Taking Active Self, Pharmacy Records  omeprazole  [facility]) 20 MG capsule [id number] Yes Take 1 capsule (20 mg total) by mouth daily. Graig Lawyer, MD Taking Active Self, Pharmacy Records  sertraline  (ZOLOFT ) 25 MG tablet [id number] No TAKE 1 TABLET (25 MG TOTAL) BY MOUTH DAILY.  Patient not taking: Reported on 11/30/2023   Graig Lawyer, MD Not Taking Active            Med Note Burley Carpenter, Shere Eisenhart   Tue Nov 30, 2023  3:24 PM) Patient reports stopped taking "make me feel sick"  traMADol  (ULTRAM ) 50 MG tablet [id number] Yes Take 1 tablet (50 mg total) by mouth every 8 (eight) hours as needed. Graig Lawyer, MD Taking Active Self, Pharmacy Records            Recommendation:   Referral to: BSW for food insecurity  Follow Up Plan:   Telephone follow up appointment date/time:  05/ 6/25 at 3:00   Clarnce Crow BSN RN CCM [place]  [facility], [facility] RN Care Manager Direct Dial: [phone] Fax: [phone]

## 2023-11-30 NOTE — Patient Instructions (Signed)
 Visit Information  Thank you for taking time to visit with me today. Please don't hesitate to contact me if I can be of assistance to you before our next scheduled appointment.  Our next appointment is by telephone on 12/14/23 at 3:00 Please call the care guide team at [phone] if you need to cancel or reschedule your appointment.   Following is a copy of your care plan:   Goals Addressed             This Visit's Progress    [facility] RN Care Plan       Problems:  Care Coordination needs related to Food Insecurity  Chronic Disease Management support and education needs related to CHF, CKD Stage 4, and DMII  Goal: Over the next 14 days the Patient will attend all scheduled medical appointments: 12/03/23 LCSW, 12/07/23 Clinical Pharmacist, 12/09/23 Nephrology, 01/10/24 PCP, 01/25/24 Endocrinology, 02/03/24 Cardiology as evidenced by chart review and patient report        continue to work with RN Care Manager and/or Social Worker to address care management and care coordination needs related to CHF, CKD Stage 4, and DMII as evidenced by adherence to care management team scheduled appointments     demonstrate a decrease CHF, CKD Stage 4, and DMII in exacerbations as evidenced by chart review, patient report of BP, weight, and blood sugar readings demonstrate Improved adherence to prescribed treatment plan for CHF and CKD Stage 4 as evidenced by taking medications on time as prescribed, monitoring daily weight and blood pressure and recording data to review with medical providers. Attending all appointments as scheduled. Lifestyle and dietary modifications as directed. demonstrate Improved health management independence as evidenced by chart review and patient report         experience decrease in ED visits as evidenced by electronic medical record review; ED visits in last 6 months = 2 not experience hospital admission as evidenced by review of electronic medical record. Hospital Admissions in last 6  months = 1 take all medications exactly as prescribed and will call provider for medication related questions as evidenced by chart review and patient report     Interventions:   Heart Failure Interventions: Basic overview and discussion of pathophysiology of Heart Failure reviewed Provided education on low sodium diet Reviewed Heart Failure Action Plan in depth and provided written copy Assessed need for readable accurate scales in home Advised patient to weigh each morning after emptying bladder Discussed importance of daily weight and advised patient to weigh and record daily Reviewed role of diuretics in prevention of fluid overload and management of heart failure; Discussed the importance of keeping all appointments with provider Referral made to community resources care guide team for assistance with food insecurity;   Chronic Kidney Disease Interventions: Evaluation of current treatment plan related to chronic kidney disease self management and patient's adherence to plan as established by provider      Provided education to patient re: stroke prevention, s/s of heart attack and stroke    Reviewed prescribed diet low salt, fluid restrictions Reviewed medications with patient and discussed importance of compliance    Advised patient, providing education and rationale, to monitor blood pressure daily and record, calling PCP for findings outside established parameters    Discussed complications of poorly controlled blood pressure such as heart disease, stroke, circulatory complications, vision complications, kidney impairment, sexual dysfunction    Discussed plans with patient for ongoing care management follow up and provided patient with direct contact information for  care management team    Discussed the impact of chronic kidney disease on daily life and mental health and acknowledged and normalized feelings of disempowerment, fear, and frustration    Assessed social determinant of  health barriers    Called nephrology (Dr Alvie Jolly) to inform of clearance from vascular surgeon re: fistula, patient is now cleared for HD.  Patient has upcoming appt on 12/09/23, HD initiation to be determined at that time. Last practice recorded BP readings:  BP Readings from Last 3 Encounters:  11/22/23 138/66  11/03/23 124/78  10/11/23 (!) 144/86   Most recent eGFR/CrCl:  Lab Results  Component Value Date   EGFR 20 (L) 06/14/2023    No components found for: "CRCL"  Patient Self-Care Activities:  Attend all scheduled provider appointments Call pharmacy for medication refills 3-7 days in advance of running out of medications Call provider office for new concerns or questions  Perform all self care activities independently  Perform IADL's (shopping, preparing meals, housekeeping, managing finances) independently Take medications as prescribed   Work with the social worker to address care coordination needs and will continue to work with the clinical team to address health care and disease management related needs Work with the pharmacist to address medication management needs and will continue to work with the clinical team to address health care and disease management related needs call office if I gain more than 2 pounds in one day or 5 pounds in one week keep legs up while sitting track weight in diary use salt in moderation watch for swelling in feet, ankles and legs every day weigh myself daily bring diary to all appointments know when to call the doctor:weight gain of more than 3 lbs in one day or 8 lbs in one week.  Plan:  Telephone follow up appointment with care management team member scheduled for:  12/14/23 at 3:00 Placed referral to [facility] for BSW to assist with food insecurity             Please call the Suicide and Crisis Lifeline: 988 call the [place]  National Suicide Prevention Lifeline: 224-213-4409 or TTY: 940 092 5563 TTY 201-795-2816) to talk to a trained  counselor call 1-800-273-TALK (toll free, 24 hour hotline) if you are experiencing a Mental Health or Behavioral Health Crisis or need someone to talk to.  Patient verbalizes understanding of instructions and care plan provided today and agrees to view in MyChart. Active MyChart status and patient understanding of how to access instructions and care plan via MyChart confirmed with patient.      Clarnce Crow BSN RN CCM [place]  [facility], [facility] RN Care Manager Direct Dial: [phone] Fax: [phone]

## 2023-12-01 ENCOUNTER — Telehealth: Payer: Self-pay | Admitting: *Deleted

## 2023-12-01 NOTE — Progress Notes (Signed)
 Complex Care Management Note Care Guide Note  12/01/2023 Name: Peter Terry MRN: [id number] DOB: [birth year]   Complex Care Management Outreach Attempts: An unsuccessful telephone outreach was attempted today to offer the patient information about available complex care management services.  Follow Up Plan:  Additional outreach attempts will be made to offer the patient complex care management information and services.   Encounter Outcome:  No Answer  [place] Health[facility] Health Care Guide  Direct [phone] Fax:[phone] Website: [email or web address]

## 2023-12-03 ENCOUNTER — Other Ambulatory Visit: Payer: Self-pay | Admitting: Licensed Clinical Social Worker

## 2023-12-03 NOTE — Patient Outreach (Signed)
 Complex Care Management   Visit Note  12/03/2023  Name:  Peter Terry MRN: [id number] DOB: 19-Jul-1956  Situation: Referral received for Complex Care Management related to SDOH Barriers:  Food insecurity I obtained verbal consent from Patient.  Visit completed with Clydie Darter  on the phone  Background:   Past Medical History:  Diagnosis Date   Acute heart failure with preserved ejection fraction (HFpEF) (HCC) 05/02/2023   Acute on chronic diastolic CHF (congestive heart failure) (HCC) 08/25/2023   Acute renal failure superimposed on stage 4 chronic kidney disease (HCC) 08/26/2023   Anemia of chronic disease 09/01/2023   Cellulitis of right lower extremity 09/01/2023   CHF (congestive heart failure) (HCC)    Chronic kidney disease, stage 4 (severe) (HCC) 03/31/2023   Chronic right hip pain 07/13/2023   COPD (chronic obstructive pulmonary disease) (HCC) 03/30/2023   Diabetic peripheral neuropathy (HCC) 03/31/2023   Dyspnea on exertion 05/02/2023   Edema of both lower legs 03/31/2023   Essential hypertension 03/30/2023   GERD (gastroesophageal reflux disease) 03/31/2023   Heart Failure with Preserved EF (HCC) 03/31/2023   Hypomagnesemia 11/04/2022   Insulin  long-term use (HCC) 08/21/2020   Iron  deficiency anemia 03/31/2022   Mixed hyperlipidemia 03/30/2023   Morbid obesity with BMI of 45.0-49.9, adult (HCC) 07/25/2020   Morbid obesity with body mass index (BMI) of 50.0 to 59.9 in adult (HCC) 07/25/2020   Multiple open wounds of lower extremity 05/02/2023   Obesity, class 3 09/01/2023   Obstructive sleep apnea 03/31/2023   Reactive depression 10/11/2023   Retinal tear 02/20/2013   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/11/2023   Tinea pedis 03/31/2023   Type 2 diabetes mellitus with diabetic neuropathy, unspecified (HCC) 11/04/2022   Type 2 diabetes mellitus with hyperglycemia, with long-term current use of insulin  (HCC) 05/02/2023   Type 2 diabetes mellitus with  stage 4 chronic kidney disease, with long-term current use of insulin  (HCC) 11/04/2022   Venous stasis dermatitis 03/31/2023   Venous stasis ulcer of left calf limited to breakdown of skin without varicose veins (HCC) 07/13/2023    Assessment: Patient Reported Symptoms:  Cognitive        Neurological      HEENT        Cardiovascular      Respiratory      Endocrine      Gastrointestinal        Genitourinary      Integumentary      Musculoskeletal          Psychosocial              11/22/2023    2:01 PM  Depression screen PHQ 2/9  Decreased Interest 3  Down, Depressed, Hopeless 1  PHQ - 2 Score 4  Altered sleeping 3  Tired, decreased energy 3  Change in appetite 3  Feeling bad or failure about yourself  1  Trouble concentrating 0  Moving slowly or fidgety/restless 0  Suicidal thoughts 0  PHQ-9 Score 14  Difficult doing work/chores Somewhat difficult    There were no vitals filed for this visit.  Medications Reviewed Today   Medications were not reviewed in this encounter     Recommendation:   Pt will connect and collaborate with food resources provided via mychart pt message. LCSW A. Emelin Dascenzo contacted [employer] and placed pt on wait list.   Follow Up Plan:   Telephone follow up appointment date/time:  12/24/2023

## 2023-12-03 NOTE — Patient Instructions (Signed)
 Visit Information  Thank you for taking time to visit with me today. Please don't hesitate to contact me if I can be of assistance to you before our next scheduled appointment.  Your next care management appointment is by telephone on 12/24/2023 at 10am  Telephone follow up appointment date/time:  12/24/23 10am  Please call the care guide team at [phone] if you need to cancel, schedule, or reschedule an appointment.   Please call 911 if you are experiencing a Mental Health or Behavioral Health Crisis or need someone to talk to.  Fletcher Humble MSW, LCSW Licensed Clinical Social Worker  [facility], Population Health Direct Dial: [phone]  Fax: [phone]

## 2023-12-06 ENCOUNTER — Telehealth: Payer: Self-pay | Admitting: *Deleted

## 2023-12-06 NOTE — Progress Notes (Signed)
 Complex Care Management Note Care Guide Note  12/06/2023 Name: [name] MRN: [id number] DOB: November 19, 1955  [name] is a 68 y.o. year old male who is a primary care patient of Rudd, Malcolm Scrivener, MD . The community resource team was consulted for assistance with Food Insecurity  SDOH screenings and interventions completed:  Yes     SDOH Interventions Today    Flowsheet Row Most Recent Value  SDOH Interventions   Food Insecurity Interventions Community Resources Provided  [Provided with food banks patient said he has enough]        Care guide performed the following interventions: Patient provided with information about care guide support team and interviewed to confirm resource needs.  Follow Up Plan:  No further follow up planned at this time. The patient has been provided with needed resources.  Encounter Outcome:  Patient Visit Completed  Aydyn Testerman [place]  [facility] HealthPopulation Health Care Guide  Direct Dial:[phone] Fax:[phone] Website: [email or web address]

## 2023-12-07 ENCOUNTER — Other Ambulatory Visit: Payer: Self-pay

## 2023-12-07 NOTE — Progress Notes (Addendum)
 12/07/2023 Name: Peter Terry MRN: [id number] DOB: [birth year]  Chief Complaint  Patient presents with   Medication Management   Peter Terry is a 68 y.o. year old male who presented for a telephone visit.   They were referred to the pharmacist by their Case Management Team  for assistance in managing complex medication management.   Subjective:  Care Team: Primary Care Provider: Graig Lawyer, MD ; Next Scheduled Visit: 01/10/2024  Medication Access/Adherence  Current Pharmacy:  [id number] - [place], [place] - [address] [place] [address] Phone: [phone] Fax: [phone]  [facility] [place], [place] - [address] [place] [address] [place] [address] Phone: [phone] Fax: [phone]  [facility] [address] [place] [address] Phone: [phone] Fax: [phone]  -Patient reports affordability concerns with their medications: Yes  -Patient reports access/transportation concerns to their pharmacy: No  -Patient reports adherence concerns with their medications:  No    Diabetes: Current medications: Lantus  55 units BID, Novolog  70/30 60 units BID with meals -Medications tried in the past: metformin cause decreased kidney function -Using Dexcom G7 for CGM and endorses BG dropping as low as 50 at times and going as high at >400 at times -Patient does not have consistent meals/snacks and will skip meals at times -Would like to lose weight, which would also improve glycemic control -Recently A1c 9.2% -Endocrinology appt scheduled for July  -Patient endorses insulins are expensive for him to fill/pick up  Hypertension: Current medications: amlodipine  10mg  daily, bumetanide  2mg  BID, carvedilol  25mg  BID, hydralazine  25mg  TID, isosorbide  MN ER 30mg  daily, losartan  100mg  daily, metolozone 5mg  daily  -Patient does not have a  validated, automated, upper arm home BP cuff -Current blood pressure readings readings: 138/66 at 4/14 OV  Hyperlipidemia/ASCVD Risk Reduction Current lipid lowering medications: atorvastatin  80mg  daily Antiplatelet regimen: none documented -CHF diagnosis, along with additional ASCVD risk factors of COPD, DM, HTN, HLD, OSA  Objective:  Lab Results  Component Value Date   HGBA1C 9.2 (H) 10/12/2023   Lab Results  Component Value Date   CREATININE 4.79 (HH) 09/13/2023   BUN 90 (HH) 09/13/2023   NA 139 09/13/2023   K 3.1 (L) 09/13/2023   CL 102 09/13/2023   CO2 24 09/13/2023   Lab Results  Component Value Date   CHOL 165 03/31/2023   HDL 30.50 (L) 03/31/2023   LDLDIRECT 106.0 03/31/2023   TRIG 220.0 (H) 03/31/2023   CHOLHDL 5 03/31/2023   Medications Reviewed Today     Reviewed by Linn Rich, RPH (Pharmacist) on 12/07/23 at 1537  Med List Status: <None>   Medication Order Taking? Sig Documenting Provider Last Dose Status Informant  albuterol  (VENTOLIN  HFA) 108 (90 Base) MCG/ACT inhaler [id number] Yes Inhale 2 puffs into the lungs every 4 (four) hours as needed for wheezing or shortness of breath. [provider] Taking Active Self, Pharmacy Records  amLODipine  (NORVASC ) 10 MG tablet [id number] Yes Take 10 mg by mouth every morning. [provider] Taking Active   atorvastatin  (LIPITOR) 80 MG tablet [id number] Yes Take 1 tablet (80 mg total) by mouth at bedtime. Graig Lawyer, MD Taking Active   B-D UF III MINI PEN NEEDLES 31G X 5 MM MISC [id number] Yes Inject into the skin daily. [provider] Taking Active   bumetanide  (BUMEX ) 2 MG tablet [id number] Yes  Take 2 mg by mouth 2 (two) times daily. [provider] Taking Active   carvedilol  (COREG ) 25 MG tablet [id number] Yes Take 1 tablet (25 mg total) by mouth 2 (two) times daily. Graig Lawyer, MD Taking Active   gabapentin  (NEURONTIN ) 100 MG capsule [id number] Yes Take 1 capsule (100 mg  total) by mouth 3 (three) times daily as needed (neuropathy pain). Graig Lawyer, MD Taking Active   hydrALAZINE  (APRESOLINE ) 25 MG tablet [id number] Yes TAKE 3 TABLETS (75 MG TOTAL) BY MOUTH 3 TIMES A DAY Graig Lawyer, MD Taking Active   insulin  glargine (LANTUS ) 100 UNIT/ML Solostar Pen [id number] Yes Inject 60 Units into the skin 2 (two) times daily.  Patient taking differently: Inject 55 Units into the skin 2 (two) times daily.   Graig Lawyer, MD Taking Active   insulin  NPH-regular Human (70-30) 100 UNIT/ML injection [id number] Yes Inject 60 Units into the skin 2 (two) times daily with a meal. [provider] Taking Active   isosorbide  mononitrate (IMDUR ) 30 MG 24 hr tablet [id number] Yes TAKE 1 TABLET BY MOUTH EVERY DAY Graig Lawyer, MD Taking Active   losartan  (COZAAR ) 100 MG tablet [id number] Yes Take 100 mg by mouth daily. [provider] Taking Active   metolazone  (ZAROXOLYN ) 5 MG tablet [id number] Yes Take 5 mg by mouth daily. [provider] Taking Active   omeprazole  (PRILOSEC) 20 MG capsule [id number] Yes Take 1 capsule (20 mg total) by mouth daily. Graig Lawyer, MD Taking Active Self, Pharmacy Records  traMADol  (ULTRAM ) 50 MG tablet [id number] Yes Take 1 tablet (50 mg total) by mouth every 8 (eight) hours as needed. Graig Lawyer, MD Taking Active Self, Pharmacy Records           Assessment/Plan:   Diabetes: -Currently uncontrolled -Patient prefers to defer DM medication changes to endocrinology once seen in July -Will continue current regimen at this time -Continue CGM with Dexcom G7 -Patient likely qualifies for Medicaid- will have LCSW discuss eligibility further at follow-up 5/16.  If he will not qualify, we can look into applying for PAP or LIS Extra Help to assist with insulin  costs  Hypertension: -Currently moderately controlled -Going to see if insurance will cover home BP monitor; if not, I can supply under [facility] standing  order -Continue current regimen at this time  Hyperlipidemia/ASCVD Risk Reduction: -Currently uncontrolled based on lipid panel from August 2024 -Patient sees PCP again in June, and I recommend f/u lipid panel  Follow Up Plan: 3 weeks to follow-up on medication affordability and home BP monitor  Linn Rich, PharmD, DPLA

## 2023-12-08 NOTE — Patient Outreach (Signed)
 Spk with pt to provide information regarding applying for medicaid

## 2023-12-09 DIAGNOSIS — D631 Anemia in chronic kidney disease: Secondary | ICD-10-CM | POA: Diagnosis not present

## 2023-12-09 DIAGNOSIS — Z6841 Body Mass Index (BMI) 40.0 and over, adult: Secondary | ICD-10-CM | POA: Diagnosis not present

## 2023-12-09 DIAGNOSIS — N185 Chronic kidney disease, stage 5: Secondary | ICD-10-CM | POA: Diagnosis not present

## 2023-12-09 DIAGNOSIS — R600 Localized edema: Secondary | ICD-10-CM | POA: Diagnosis not present

## 2023-12-09 DIAGNOSIS — N184 Chronic kidney disease, stage 4 (severe): Secondary | ICD-10-CM | POA: Diagnosis not present

## 2023-12-09 DIAGNOSIS — I120 Hypertensive chronic kidney disease with stage 5 chronic kidney disease or end stage renal disease: Secondary | ICD-10-CM | POA: Diagnosis not present

## 2023-12-09 DIAGNOSIS — E1122 Type 2 diabetes mellitus with diabetic chronic kidney disease: Secondary | ICD-10-CM | POA: Diagnosis not present

## 2023-12-10 NOTE — Progress Notes (Signed)
   12/10/2023  Patient ID: Peter Terry, male   DOB: [birth year], 68 y.o.   MRN: [id number]  Follow-up to telephone visit earlier this week.  Reviewed all current medications to verify dose appropriate based on patient's current kidney function.  Patient was seen by neprhrology yesterday, and eGFR 16.  Referral was placed for HD.  All current medications and doses are appropriate based on current kidney function.  Order sent to [employer] DME supplier via [facility] to see if insurance will cover automated, upper arm BP monitor.  If not, I will provide under [facility] standing order.  Telephone follow-up visit scheduled with patient 5/22, and he sees PCP again 6/2.  Social work contacted patient 5/30 to discuss applying for medicaid- will check to see if patient has been able to do so at our next visit.  Linn Rich, PharmD, DPLA

## 2023-12-14 ENCOUNTER — Telehealth: Payer: Self-pay

## 2023-12-14 NOTE — Patient Outreach (Signed)
 RNCM Telephone Encounter Note  Called patient to complete scheduled CCM RNCM follow up visit.  Patient reports just returned home from first day of HD, not feeling well.  Patient confirms he is resting at home, has someone there at home to look after him.  RNCM instructed patient to be aware of symptoms that would warrant ED visit, he understood and our appointment was rescheduled for 12/27/23 at 1:00

## 2023-12-18 ENCOUNTER — Other Ambulatory Visit: Payer: Self-pay | Admitting: Family Medicine

## 2023-12-22 ENCOUNTER — Other Ambulatory Visit: Payer: Self-pay | Admitting: Family Medicine

## 2023-12-22 DIAGNOSIS — I10 Essential (primary) hypertension: Secondary | ICD-10-CM

## 2023-12-22 DIAGNOSIS — G8929 Other chronic pain: Secondary | ICD-10-CM

## 2023-12-24 ENCOUNTER — Other Ambulatory Visit: Payer: Self-pay | Admitting: Licensed Clinical Social Worker

## 2023-12-27 ENCOUNTER — Telehealth: Payer: Self-pay

## 2023-12-27 NOTE — Patient Outreach (Signed)
 Complex Care Management   Visit Note  12/27/2023  Name:  Peter Terry MRN: [id number] DOB: [birth year]  Situation: Referral received for Complex Care Management related to SDOH Barriers:  Food insecurity I obtained verbal consent from Patient.  Visit completed with Clydie Darter   on the phone  Background:   Past Medical History:  Diagnosis Date   Acute heart failure with preserved ejection fraction (HFpEF) (HCC) 05/02/2023   Acute on chronic diastolic CHF (congestive heart failure) (HCC) 08/25/2023   Acute renal failure superimposed on stage 4 chronic kidney disease (HCC) 08/26/2023   Anemia of chronic disease 09/01/2023   Cellulitis of right lower extremity 09/01/2023   CHF (congestive heart failure) (HCC)    Chronic kidney disease, stage 4 (severe) (HCC) 03/31/2023   Chronic right hip pain 07/13/2023   COPD (chronic obstructive pulmonary disease) (HCC) 03/30/2023   Diabetic peripheral neuropathy (HCC) 03/31/2023   Dyspnea on exertion 05/02/2023   Edema of both lower legs 03/31/2023   Essential hypertension 03/30/2023   GERD (gastroesophageal reflux disease) 03/31/2023   Heart Failure with Preserved EF (HCC) 03/31/2023   Hypomagnesemia 11/04/2022   Insulin  long-term use (HCC) 08/21/2020   Iron  deficiency anemia 03/31/2022   Mixed hyperlipidemia 03/30/2023   Morbid obesity with BMI of 45.0-49.9, adult (HCC) 07/25/2020   Morbid obesity with body mass index (BMI) of 50.0 to 59.9 in adult (HCC) 07/25/2020   Multiple open wounds of lower extremity 05/02/2023   Obesity, class 3 09/01/2023   Obstructive sleep apnea 03/31/2023   Reactive depression 10/11/2023   Retinal tear 02/20/2013   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/11/2023   Tinea pedis 03/31/2023   Type 2 diabetes mellitus with diabetic neuropathy, unspecified (HCC) 11/04/2022   Type 2 diabetes mellitus with hyperglycemia, with long-term current use of insulin  (HCC) 05/02/2023   Type 2 diabetes mellitus with  stage 4 chronic kidney disease, with long-term current use of insulin  (HCC) 11/04/2022   Venous stasis dermatitis 03/31/2023   Venous stasis ulcer of left calf limited to breakdown of skin without varicose veins (HCC) 07/13/2023    Assessment: Patient Reported Symptoms:  Cognitive Cognitive Status: Able to follow simple commands, Alert and oriented to person, place, and time, Normal speech and language skills Cognitive/Intellectual Conditions Management [RPT]: Not Assessed      Neurological Neurological Review of Symptoms: No symptoms reported    HEENT HEENT Symptoms Reported: No symptoms reported      Cardiovascular Cardiovascular Symptoms Reported: No symptoms reported Cardiovascular Conditions: Heart failure, Hypertension  Respiratory Respiratory Symptoms Reported: No symptoms reported    Endocrine Patient reports the following symptoms related to hypoglycemia or hyperglycemia : No symptoms reported Is patient diabetic?: Yes Is patient checking blood sugars at home?: Yes Endocrine Conditions: Diabetes  Gastrointestinal Gastrointestinal Symptoms Reported: No symptoms reported      Genitourinary Genitourinary Symptoms Reported: No symptoms reported Genitourinary Conditions: Chronic kidney disease Genitourinary Management Strategies: Hemodialysis  Integumentary Integumentary Symptoms Reported: No symptoms reported    Musculoskeletal Musculoskelatal Symptoms Reviewed: No symptoms reported   Falls in the past year?: No    Psychosocial       Quality of Family Relationships: helpful, supportive Do you feel physically threatened by others?: No      12/27/2023   11:09 AM  Depression screen PHQ 2/9  Decreased Interest 2  Down, Depressed, Hopeless 0  PHQ - 2 Score 2  Altered sleeping 2  Tired, decreased energy 3  Change in appetite 3  Feeling bad  or failure about yourself  0  Trouble concentrating 0  Moving slowly or fidgety/restless 0  Suicidal thoughts 0  PHQ-9 Score  10    There were no vitals filed for this visit.  Medications Reviewed Today     Reviewed by Fletcher Humble, LCSW (Social Worker) on 12/27/23 at 1114  Med List Status: <None>   Medication Order Taking? Sig Documenting Provider Last Dose Status Informant  albuterol  (VENTOLIN  HFA) 108 (90 Base) MCG/ACT inhaler [id number] No Inhale 2 puffs into the lungs every 4 (four) hours as needed for wheezing or shortness of breath. [provider] Taking Active Self, Pharmacy Records  amLODipine  (NORVASC ) 10 MG tablet [id number]  TAKE 1 TABLET (10 MG TOTAL) BY MOUTH EVERY MORNING. Graig Lawyer, MD  Active   atorvastatin  (LIPITOR) 80 MG tablet [id number] No Take 1 tablet (80 mg total) by mouth at bedtime. Graig Lawyer, MD Taking Active   B-D UF III MINI PEN NEEDLES 31G X 5 MM MISC [id number]  FOR USE WITH PEN DAILY. Graig Lawyer, MD  Active   bumetanide  (BUMEX ) 2 MG tablet [id number] No Take 2 mg by mouth 2 (two) times daily. [provider] Taking Active   carvedilol  (COREG ) 25 MG tablet [id number] No Take 1 tablet (25 mg total) by mouth 2 (two) times daily. Graig Lawyer, MD Taking Active   gabapentin  (NEURONTIN ) 100 MG capsule [id number] No Take 1 capsule (100 mg total) by mouth 3 (three) times daily as needed (neuropathy pain). Graig Lawyer, MD Taking Active   hydrALAZINE  (APRESOLINE ) 25 MG tablet [id number] No TAKE 3 TABLETS (75 MG TOTAL) BY MOUTH 3 TIMES A DAY Rudd, Malcolm Scrivener, MD Taking Active   insulin  aspart protamine- aspart (NOVOLOG  MIX 70/30) (70-30) 100 UNIT/ML injection [id number] No Inject 60 Units into the skin 2 (two) times daily with a meal. [provider] Taking Active   insulin  glargine (LANTUS ) 100 UNIT/ML Solostar Pen [id number] No Inject 60 Units into the skin 2 (two) times daily.  Patient taking differently: Inject 55 Units into the skin 2 (two) times daily.   Graig Lawyer, MD Taking Active   isosorbide  mononitrate (IMDUR ) 30 MG 24 hr tablet  [id number] No TAKE 1 TABLET BY MOUTH EVERY DAY Graig Lawyer, MD Taking Active   losartan  (COZAAR ) 100 MG tablet [id number] No Take 100 mg by mouth daily. [provider] Taking Active   metolazone  (ZAROXOLYN ) 5 MG tablet [id number] No Take 5 mg by mouth daily. [provider] Taking Active   omeprazole  (PRILOSEC) 20 MG capsule [id number] No Take 1 capsule (20 mg total) by mouth daily. Graig Lawyer, MD Taking Active Self, Pharmacy Records  traMADol  (ULTRAM ) 50 MG tablet [id number]  TAKE 1 TABLET BY MOUTH EVERY 8 HOURS AS NEEDED. Graig Lawyer, MD  Active             Recommendation:   Continue to collaborate with community resources such as meals on wheels, [place] [employer] for food resources.   Follow Up Plan:   Telephone follow up appointment date/time:  01/10/2024 10am  Fletcher Humble MSW, LCSW Licensed Clinical Social Worker  [facility], Population Health Direct Dial: [phone]  Fax: [phone]

## 2023-12-27 NOTE — Patient Instructions (Addendum)
 Visit Information  Thank you for taking time to visit with me today. Please don't hesitate to contact me if I can be of assistance to you before our next scheduled appointment.  Your next care management appointment is by telephone on 01/10/2024 at 10am  Telephone follow up appointment date/time:  01/10/2024 10am  Please call the care guide team at [phone] if you need to cancel, schedule, or reschedule an appointment.   Please call 911 if you are experiencing a Mental Health or Behavioral Health Crisis or need someone to talk to.  Fletcher Humble MSW, LCSW Licensed Clinical Social Worker  [facility], Population Health Direct Dial: [phone]  Fax: [phone]

## 2023-12-30 ENCOUNTER — Other Ambulatory Visit: Payer: Self-pay

## 2023-12-31 NOTE — Progress Notes (Unsigned)
   12/31/2023  Patient ID: Peter Terry, male   DOB: 29-May-1956, 68 y.o.   MRN: [id number]  Subjective/Objective Telephone visit to follow-up on management of diabetes and hypertension  Diabetes: Current medications: Lantus  60 units BID, Novolog  70/30 60 units BID with meals -Medications tried in the past: metformin cause decreased kidney function -Using Dexcom G7 for CGM and endorses BG dropping as low as 50 at times and going as high at >400 at times -Patient does not have consistent meals/snacks and will skip meals at times -Would like to lose weight, which would also improve glycemic control -Recently A1c 9.2% -Endocrinology appt scheduled for July  -Patient endorses insulins are expensive for him to fill/pick up -Would prefer Novolog  mix to be in flexpen form like his Lantus  is -He has applied for disability and is waiting approximately 2-3 months on response -Social work contacted patient to inform where to apply for Medicaid, as his HHI may make him eligible   Hypertension: Current medications: amlodipine  10mg  daily, bumetanide  2mg  BID, carvedilol  25mg  BID, hydralazine  25mg  TID, isosorbide  [place] ER 30mg  daily, losartan  100mg  daily, metolozone 5mg  daily  -Patient does not have a validated, automated, upper arm home BP cuff- states DME supplier contacted to verify information, but order was not sent/received  -Current blood pressure readings readings: 138/66 at 4/14 OV  Assessment/Plan:    Diabetes: -Currently uncontrolled -Patient prefers to defer DM medication changes to endocrinology once seen in July -Will continue current regimen at this time -Continue CGM with Dexcom G7 -If patient is not approved for disability or medicaid, we could pursue [employer] Medicare Extra Help to assist with affordability of insulins -Pending order for Novolog  Mix 70/30 flexpen 60 units BID with meals and updated pen needle prescription for PCP to sign if in agreement  Hypertension: -Currently  moderately controlled -Will follow-up with DME supplier to check on insurance coverage of home monitor- if not covered, I will provide on under [facility] standing order -Continue current regimen at this time   Follow-up:  will f/u regarding home BP monitor and schedule telephone follow-up visit for 4 weeks out  Linn Rich, PharmD, DPLA

## 2024-01-04 ENCOUNTER — Other Ambulatory Visit: Payer: Self-pay

## 2024-01-04 MED ORDER — INSULIN ASPART PROT & ASPART (70-30 MIX) 100 UNIT/ML PEN: 60.0000 [IU] | PEN_INJECTOR | Freq: Two times a day (BID) | SUBCUTANEOUS | 1 refills | Status: AC

## 2024-01-04 MED ORDER — BD PEN NEEDLE MINI U/F 31G X 5 MM MISC: 1 refills | Status: AC

## 2024-01-04 NOTE — Patient Instructions (Signed)
 Visit Information  Thank you for taking time to visit with me today. Please don't hesitate to contact me if I can be of assistance to you before our next scheduled appointment.  Our next appointment is by telephone on 02/04/24 at 3:00 Please call the care guide team at [phone] if you need to cancel or reschedule your appointment.   Following is a copy of your care plan:   Goals Addressed             This Visit's Progress    VBCI RN Care Plan   Improving    Problems:  Care Coordination needs related to Food Insecurity  Chronic Disease Management support and education needs related to CHF, DMII, and ESRD  Goal: Over the next 30days the Patient will attend all scheduled medical appointments: 01/10/24 LCSW, 01/10/24 PCP, 02/02/24 Cardiology, 02/29/24 Endocrinology as evidenced by chart review and patient report        continue to work with RN Care Manager and/or Social Worker to address care management and care coordination needs related to CHF, DMII, and ESRD as evidenced by adherence to care management team scheduled appointments     demonstrate a decrease CHF, DMII, and ESRD in exacerbations as evidenced by chart review, patient report of BP, weight, and blood sugar readings demonstrate Improved adherence to prescribed treatment plan for CHF, DMII, and ESRD as evidenced by taking medications on time as prescribed, monitoring daily weight and blood pressure and recording data to review with medical providers. Attending all appointments as scheduled. Lifestyle and dietary modifications as directed. demonstrate Improved health management independence as evidenced by chart review and patient report         experience decrease in ED visits as evidenced by electronic medical record review; ED visits in last 6 months = 2 not experience hospital admission as evidenced by review of electronic medical record. Hospital Admissions in last 6 months = 1 take all medications exactly as prescribed and will  call provider for medication related questions as evidenced by chart review and patient report     Interventions:   Heart Failure Interventions: Basic overview and discussion of pathophysiology of Heart Failure reviewed Provided education on low sodium diet Reviewed Heart Failure Action Plan in depth and provided written copy Assessed need for readable accurate scales in home Advised patient to weigh each morning after emptying bladder Discussed importance of daily weight and advised patient to weigh and record daily Reviewed role of diuretics in prevention of fluid overload and management of heart failure; Discussed the importance of keeping all appointments with provider Referral made to community resources care guide team for assistance with food insecurity;   Chronic Kidney Disease Interventions: Evaluation of current treatment plan related to chronic kidney disease self management and patient's adherence to plan as established by provider      Provided education to patient re: stroke prevention, s/s of heart attack and stroke    Reviewed prescribed diet low salt, fluid restrictions Reviewed medications with patient and discussed importance of compliance    Advised patient, providing education and rationale, to monitor blood pressure daily and record, calling PCP for findings outside established parameters    Discussed complications of poorly controlled blood pressure such as heart disease, stroke, circulatory complications, vision complications, kidney impairment, sexual dysfunction    Discussed plans with patient for ongoing care management follow up and provided patient with direct contact information for care management team    Discussed the impact of chronic kidney disease  on daily life and mental health and acknowledged and normalized feelings of disempowerment, fear, and frustration    Assessed social determinant of health barriers    Patient has started HD TTHSat.  Patient has  not been taking BP at home, Clinical Pharmacist to provide BP cuff, instructed patient to obtain this at his next PCP visit on 01/10/24 Last practice recorded BP readings:  BP Readings from Last 3 Encounters:  11/22/23 138/66  11/03/23 124/78  10/11/23 (!) 144/86   Most recent eGFR/CrCl:  Lab Results  Component Value Date   EGFR 20 (L) 06/14/2023    No components found for: "CRCL"  Patient Self-Care Activities:  Attend all scheduled provider appointments Call pharmacy for medication refills 3-7 days in advance of running out of medications Call provider office for new concerns or questions  Perform all self care activities independently  Perform IADL's (shopping, preparing meals, housekeeping, managing finances) independently Take medications as prescribed   Work with the social worker to address care coordination needs and will continue to work with the clinical team to address health care and disease management related needs Work with the pharmacist to address medication management needs and will continue to work with the clinical team to address health care and disease management related needs call office if I gain more than 2 pounds in one day or 5 pounds in one week keep legs up while sitting track weight in diary use salt in moderation watch for swelling in feet, ankles and legs every day weigh myself daily bring diary to all appointments know when to call the doctor:weight gain of more than 3 lbs in one day or 8 lbs in one week.  Plan:  Telephone follow up appointment with care management team member scheduled for:  02/04/24 at 3:00 Patient to obtain BP cuff at PCP visit on 01/10/24             Please call the Suicide and Crisis Lifeline: 988 call the [place]  National Suicide Prevention Lifeline: 256-704-9832 or TTY: 706 432 0286 TTY (646)472-3790) to talk to a trained counselor call 1-800-273-TALK (toll free, 24 hour hotline) if you are experiencing a Mental Health or  Behavioral Health Crisis or need someone to talk to.  Patient verbalizes understanding of instructions and care plan provided today and agrees to view in MyChart. Active MyChart status and patient understanding of how to access instructions and care plan via MyChart confirmed with patient.      Clarnce Crow BSN RN CCM [place]  [facility], [facility] Health RN Care Manager Direct Dial: [phone] Fax: [phone]

## 2024-01-04 NOTE — Progress Notes (Signed)
   01/04/2024  Patient ID: Peter Terry, male   DOB: 02/18/1956, 68 y.o.   MRN: [id number]  Sending patient a message to inform him orders for Novolog  Mix Flexpen and pen needles have been sent to his [facility].  His insurance also does not cover a blood pressure monitor, so this can be supplied under [facility] standing order; seeing it patient is able to come in 6/4; so I can provide this to him  Linn Rich, PharmD, DPLA

## 2024-01-04 NOTE — Patient Outreach (Signed)
 Complex Care Management   Visit Note  01/04/2024  Name:  Peter Terry MRN: [id number] DOB: 1955-10-18  Situation: Referral received for Complex Care Management related to Heart Failure, ESRD, and Diabetes with Complications I obtained verbal consent from Patient.  Visit completed with patient  on the phone  Background:   Past Medical History:  Diagnosis Date   Acute heart failure with preserved ejection fraction (HFpEF) (HCC) 05/02/2023   Acute on chronic diastolic CHF (congestive heart failure) (HCC) 08/25/2023   Acute renal failure superimposed on stage 4 chronic kidney disease (HCC) 08/26/2023   Anemia of chronic disease 09/01/2023   Cellulitis of right lower extremity 09/01/2023   CHF (congestive heart failure) (HCC)    Chronic kidney disease, stage 4 (severe) (HCC) 03/31/2023   Chronic right hip pain 07/13/2023   COPD (chronic obstructive pulmonary disease) (HCC) 03/30/2023   Diabetic peripheral neuropathy (HCC) 03/31/2023   Dyspnea on exertion 05/02/2023   Edema of both lower legs 03/31/2023   Essential hypertension 03/30/2023   GERD (gastroesophageal reflux disease) 03/31/2023   Heart Failure with Preserved EF (HCC) 03/31/2023   Hypomagnesemia 11/04/2022   Insulin  long-term use (HCC) 08/21/2020   Iron  deficiency anemia 03/31/2022   Mixed hyperlipidemia 03/30/2023   Morbid obesity with BMI of 45.0-49.9, adult (HCC) 07/25/2020   Morbid obesity with body mass index (BMI) of 50.0 to 59.9 in adult (HCC) 07/25/2020   Multiple open wounds of lower extremity 05/02/2023   Obesity, class 3 09/01/2023   Obstructive sleep apnea 03/31/2023   Reactive depression 10/11/2023   Retinal tear 02/20/2013   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/11/2023   Tinea pedis 03/31/2023   Type 2 diabetes mellitus with diabetic neuropathy, unspecified (HCC) 11/04/2022   Type 2 diabetes mellitus with hyperglycemia, with long-term current use of insulin  (HCC) 05/02/2023   Type 2  diabetes mellitus with stage 4 chronic kidney disease, with long-term current use of insulin  (HCC) 11/04/2022   Venous stasis dermatitis 03/31/2023   Venous stasis ulcer of left calf limited to breakdown of skin without varicose veins (HCC) 07/13/2023    Assessment: Patient Reported Symptoms:  Cognitive Cognitive Status: Alert and oriented to person, place, and time      Neurological Neurological Review of Symptoms: No symptoms reported    HEENT HEENT Symptoms Reported: No symptoms reported      Cardiovascular Cardiovascular Symptoms Reported: Swelling in legs or feet Does patient have uncontrolled Hypertension?: Yes Is patient checking Blood Pressure at home?: No (patient to be provided BP cuff by clinical pharmacist  will pick up at 01/10/24 visit with PCP) Cardiovascular Conditions: Heart failure, Hypertension Cardiovascular Management Strategies: Medication therapy, Routine screening, Weight management, Fluid modification Do You Have a Working Readable Scale?: Yes Weight: (!) 315 lb 4.1 oz (143 kg)  Respiratory Respiratory Symptoms Reported: No symptoms reported    Endocrine Patient reports the following symptoms related to hypoglycemia or hyperglycemia : No symptoms reported Is patient diabetic?: Yes Is patient checking blood sugars at home?: Yes Endocrine Conditions: Diabetes Endocrine Management Strategies: Medical device, Weight management, Medication therapy, Routine screening, Diet modification  Gastrointestinal Gastrointestinal Symptoms Reported: No symptoms reported      Genitourinary Genitourinary Symptoms Reported: No symptoms reported    Integumentary Integumentary Symptoms Reported: Wound, Other Additional Integumentary Details: 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 Skin Conditions: Other Skin Management Strategies: Medication therapy, Routine screening, Fluid modification Skin Self-Management Outcome: 3 (uncertain)  Musculoskeletal Musculoskelatal Symptoms  Reviewed: Other Musculoskeletal Conditions: Joint pain Musculoskeletal Management Strategies:  Adequate rest, Coping strategies, Medication therapy Falls in the past year?: No    Psychosocial Psychosocial Symptoms Reported: No symptoms reported            12/27/2023   11:09 AM  Depression screen PHQ 2/9  Decreased Interest 2  Down, Depressed, Hopeless 0  PHQ - 2 Score 2  Altered sleeping 2  Tired, decreased energy 3  Change in appetite 3  Feeling bad or failure about yourself  0  Trouble concentrating 0  Moving slowly or fidgety/restless 0  Suicidal thoughts 0  PHQ-9 Score 10    There were no vitals filed for this visit.  Medications Reviewed Today     Reviewed by Clarnce Crow, RN (Registered Nurse) on 01/04/24 at 1542  Med List Status: <None>   Medication Order Taking? Sig Documenting Provider Last Dose Status Informant  albuterol  (VENTOLIN  HFA) 108 (90 Base) MCG/ACT inhaler [id number] Yes Inhale 2 puffs into the lungs every 4 (four) hours as needed for wheezing or shortness of breath. [provider] Taking Active Self, Pharmacy Records  amLODipine  (NORVASC ) 10 MG tablet [id number] Yes TAKE 1 TABLET (10 MG TOTAL) BY MOUTH EVERY MORNING. Graig Lawyer, MD Taking Active   atorvastatin  (LIPITOR) 80 MG tablet [id number] Yes Take 1 tablet (80 mg total) by mouth at bedtime. Graig Lawyer, MD Taking Active   bumetanide  (BUMEX ) 2 MG tablet [id number] Yes Take 2 mg by mouth 2 (two) times daily. [provider] Taking Active   carvedilol  (COREG ) 25 MG tablet [id number] Yes Take 1 tablet (25 mg total) by mouth 2 (two) times daily. Graig Lawyer, MD Taking Active   gabapentin  (NEURONTIN ) 100 MG capsule [id number] Yes Take 1 capsule (100 mg total) by mouth 3 (three) times daily as needed (neuropathy pain). Graig Lawyer, MD Taking Active   hydrALAZINE  (APRESOLINE ) 25 MG tablet [id number] Yes TAKE 3 TABLETS (75 MG TOTAL) BY MOUTH 3 TIMES A DAY Rudd, Malcolm Scrivener, MD Taking  Active   insulin  aspart protamine - aspart (NOVOLOG  70/30 MIX) (70-30) 100 UNIT/ML FlexPen [id number] Yes Inject 60 Units into the skin 2 (two) times daily with a meal. Graig Lawyer, MD Taking Active   insulin  glargine (LANTUS ) 100 UNIT/ML Solostar Pen [id number] Yes Inject 60 Units into the skin 2 (two) times daily. Graig Lawyer, MD Taking Active   Insulin  Pen Needle (B-D UF III MINI PEN NEEDLES) 31G X 5 MM MISC [id number] Yes Use to inject insulin  4 times daily Graig Lawyer, MD Taking Active   isosorbide  mononitrate (IMDUR ) 30 MG 24 hr tablet [id number] Yes TAKE 1 TABLET BY MOUTH EVERY DAY Graig Lawyer, MD Taking Active   losartan  (COZAAR ) 100 MG tablet [id number] Yes Take 100 mg by mouth daily. [provider] Taking Active   metolazone  (ZAROXOLYN ) 5 MG tablet [id number] Yes Take 5 mg by mouth daily. [provider] Taking Active   omeprazole  (PRILOSEC) 20 MG capsule [id number] Yes Take 1 capsule (20 mg total) by mouth daily. Graig Lawyer, MD Taking Active Self, Pharmacy Records  traMADol  (ULTRAM ) 50 MG tablet [id number] Yes TAKE 1 TABLET BY MOUTH EVERY 8 HOURS AS NEEDED. 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  Follow Up Plan:   Telephone follow up appointment date/time:  02/04/24 at 3:00   Clarnce Crow BSN RN CCM [place]  [facility], [facility] RN Care Manager Direct Dial: [phone] Fax: [phone]

## 2024-01-05 ENCOUNTER — Telehealth: Payer: Self-pay

## 2024-01-05 NOTE — Progress Notes (Signed)
   01/05/2024  Patient ID: Peter Terry, male   DOB: [birth year], 68 y.o.   MRN: [id number]  Contacted patient to inform him that I will be at [facility] Tuesday 6/3 and will bring an [employer] Series 3 automated, upper arm BP monitor for him.  Patient plans to come by to pick up that day, so I will provide to front desk staff with his name on it.  I will check in with patient in 4 weeks to f/u on BP and BG readings.  Linn Rich, PharmD, DPLA

## 2024-01-08 DIAGNOSIS — Z992 Dependence on renal dialysis: Secondary | ICD-10-CM | POA: Diagnosis not present

## 2024-01-08 DIAGNOSIS — N186 End stage renal disease: Secondary | ICD-10-CM | POA: Diagnosis not present

## 2024-01-10 ENCOUNTER — Encounter: Payer: Self-pay | Admitting: Family Medicine

## 2024-01-10 ENCOUNTER — Other Ambulatory Visit: Payer: Self-pay | Admitting: Licensed Clinical Social Worker

## 2024-01-10 ENCOUNTER — Ambulatory Visit (INDEPENDENT_AMBULATORY_CARE_PROVIDER_SITE_OTHER): Admitting: Family Medicine

## 2024-01-10 VITALS — BP 120/68 | HR 76 | Temp 98.2°F | Ht 69.0 in | Wt 319.6 lb

## 2024-01-10 DIAGNOSIS — E782 Mixed hyperlipidemia: Secondary | ICD-10-CM | POA: Diagnosis not present

## 2024-01-10 DIAGNOSIS — Z794 Long term (current) use of insulin: Secondary | ICD-10-CM

## 2024-01-10 DIAGNOSIS — I10 Essential (primary) hypertension: Secondary | ICD-10-CM

## 2024-01-10 DIAGNOSIS — E1122 Type 2 diabetes mellitus with diabetic chronic kidney disease: Secondary | ICD-10-CM

## 2024-01-10 DIAGNOSIS — I5022 Chronic systolic (congestive) heart failure: Secondary | ICD-10-CM

## 2024-01-10 DIAGNOSIS — N186 End stage renal disease: Secondary | ICD-10-CM | POA: Diagnosis not present

## 2024-01-10 DIAGNOSIS — R600 Localized edema: Secondary | ICD-10-CM

## 2024-01-10 DIAGNOSIS — Z992 Dependence on renal dialysis: Secondary | ICD-10-CM

## 2024-01-10 MED ORDER — OZEMPIC (0.25 OR 0.5 MG/DOSE) 2 MG/3ML ~~LOC~~ SOPN: PEN_INJECTOR | SUBCUTANEOUS | 5 refills | Status: DC

## 2024-01-10 MED ORDER — AMLODIPINE BESYLATE 5 MG PO TABS: 5.0000 mg | ORAL_TABLET | Freq: Every day | ORAL | 3 refills | Status: DC

## 2024-01-10 NOTE — Assessment & Plan Note (Signed)
 Compensated. Weight is down 16 lbs from his last visit, likely due to dialysis. Continue carvedilol  25 mg bid, bumetanide  (Bumex ) 2 mg twice daily, and metolazone  5 mg every other day.

## 2024-01-10 NOTE — Assessment & Plan Note (Signed)
Lipids are at goal. Continue atorvastatin 80 mg daily.

## 2024-01-10 NOTE — Assessment & Plan Note (Signed)
Continue to work with nephrology.

## 2024-01-10 NOTE — Assessment & Plan Note (Signed)
 Diabetes remains in poor control. Continue insulin  glargine (Lantus ) 60 units bid and insulin  protamine-aspart (Humalog ) 70/30 60 units bid. I iwll try to start him on semaglutide (Ozempic), as he had some success in improving his blood sugar and reducing his weight when he used this previously. Endocrinology appointment pending.

## 2024-01-10 NOTE — Progress Notes (Signed)
 [facility] [address] [place] [address] Dept: [phone] Dept Fax: [phone]  Chronic Care Office Visit  Subjective:    Patient ID: Peter Terry, male    DOB: Dec 29, 1955, 68 y.o..   MRN: [id number]  Chief Complaint  Patient presents with   Follow-up    7 week f/u.  Started dialysis 3-4 weeks ago. C/o having bilateral leg swelling/weeping.     History of Present Illness:  Patient is in today for reassessment of chronic medical issues.  Peter Terry has a history of heart failure with preserved ejection fraction. This has been complicated by acute renal failure on top of ESRD. He also has essential hypertension, COPD, OSA, Type 2 diabetes, morbid obesity, anemia of chronic disease, and chronic stasis dermatitis with cellulitis. His HF is currently managed on carvedilol  25 mg bid and bumetanide  (Bumex ) 2 mg twice daily with metolazone  5 mg every other day for heart failure management. He is on  hydralazine  25 mg 3 tabs TID and isosorbide  mononitrate 30 mg daily for BP management. He has initiated dialysis since his last visit. He notes he has had an occasional episode of low blood pressure/orthostasis.   Peter Terry has ESRD. He has now started dialysis. He has also had an initial consultation with a renal transplant team.   Peter Terry has chronic lower leg edema and chronic venous stasis changes. He has had some weight loss, now on dialysis. However, he does note his swelling remains severe and he is seeping fluid from the lower legs. Despite this, he does not have dyspnea at rest or with typical activities in the home.   Peter Terry has a Type 2 diabetes diagnosed 20+ years ago. He is currently managed on metformin 1,000 mg bid, insulin  glargine (Lantus ) 60 units twice daily and Humalog  75/25 60 units twice daily. He has been referred to endocrinology, but his appointment is not until later in July.  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) 01/10/2024   Arteriovenous fistula, acquired (HCC) 11/19/2023   Reactive depression 10/11/2023   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/11/2023   Cellulitis of right lower extremity 09/01/2023   Anemia of chronic disease 09/01/2023   Obesity, class 3 09/01/2023   Chronic right hip pain 07/13/2023   Venous stasis ulcer of left calf limited to breakdown of skin without varicose veins (HCC) 07/13/2023   CHF (congestive heart failure) (HCC)    Dyspnea on exertion 05/02/2023   Chronic kidney disease, stage 4 (severe) (HCC) 03/31/2023   Obstructive sleep apnea 03/31/2023   GERD (gastroesophageal reflux disease) 03/31/2023   Edema of both lower legs 03/31/2023   Venous stasis dermatitis 03/31/2023   Tinea pedis 03/31/2023   Heart Failure with Preserved EF (HCC) 03/31/2023   Diabetic peripheral neuropathy (HCC) 03/31/2023   Mixed hyperlipidemia 03/30/2023   Essential hypertension 03/30/2023   COPD (chronic obstructive pulmonary disease) (HCC) 03/30/2023   Type 2 diabetes mellitus with diabetic chronic kidney disease (HCC) 11/04/2022   Hypomagnesemia 11/04/2022   Insulin  long-term use (HCC) 08/21/2020   Morbid obesity with body mass index (BMI) of 50.0 to 59.9 in adult [facility]) 07/25/2020   Retinal tear 02/20/2013   Past Surgical History:  Procedure Laterality Date   APPENDECTOMY     LUMBAR DISC SURGERY     L4-L5   Family History  Problem Relation Age of Onset  Cancer Mother    Diabetes Mother    Kidney disease Mother    Heart disease Father    Cancer Father        Colon   Kidney disease Father    Diabetes Father    Diabetes Brother    Heart disease Paternal Aunt    Outpatient Medications Prior to Visit  Medication Sig Dispense Refill   albuterol  (VENTOLIN  HFA) 108 (90 Base) MCG/ACT inhaler Inhale 2 puffs into the lungs every 4 (four) hours as needed for  wheezing or shortness of breath.     atorvastatin  (LIPITOR) 80 MG tablet Take 1 tablet (80 mg total) by mouth at bedtime. 90 tablet 3   bumetanide  (BUMEX ) 2 MG tablet Take 2 mg by mouth 2 (two) times daily.     carvedilol  (COREG ) 25 MG tablet Take 1 tablet (25 mg total) by mouth 2 (two) times daily. 90 tablet 3   gabapentin  (NEURONTIN ) 100 MG capsule Take 1 capsule (100 mg total) by mouth 3 (three) times daily as needed (neuropathy pain). 90 capsule 3   hydrALAZINE  (APRESOLINE ) 25 MG tablet TAKE 3 TABLETS (75 MG TOTAL) BY MOUTH 3 TIMES A DAY 810 tablet 1   insulin  aspart protamine - aspart (NOVOLOG  70/30 MIX) (70-30) 100 UNIT/ML FlexPen Inject 60 Units into the skin 2 (two) times daily with a meal. 30 mL 1   insulin  glargine (LANTUS ) 100 UNIT/ML Solostar Pen Inject 60 Units into the skin 2 (two) times daily. 15 mL 11   Insulin  Pen Needle (B-D UF III MINI PEN NEEDLES) 31G X 5 MM MISC Use to inject insulin  4 times daily 400 each 1   isosorbide  mononitrate (IMDUR ) 30 MG 24 hr tablet TAKE 1 TABLET BY MOUTH EVERY DAY 90 tablet 3   losartan  (COZAAR ) 100 MG tablet Take 100 mg by mouth daily.     metolazone  (ZAROXOLYN ) 5 MG tablet Take 5 mg by mouth daily.     omeprazole  (PRILOSEC) 20 MG capsule Take 1 capsule (20 mg total) by mouth daily. 90 capsule 3   traMADol  (ULTRAM ) 50 MG tablet TAKE 1 TABLET BY MOUTH EVERY 8 HOURS AS NEEDED. 30 tablet 2   amLODipine  (NORVASC ) 10 MG tablet TAKE 1 TABLET (10 MG TOTAL) BY MOUTH EVERY MORNING. 90 tablet 3   No facility-administered medications prior to visit.   No Known Allergies Objective:   Today's Vitals   01/10/24 1410  BP: 120/68  Pulse: 76  Temp: 98.2 F (36.8 C)  TempSrc: Temporal  SpO2: 97%  Weight: (!) 319 lb 9.6 oz (145 kg)  Height: 5\' 9"  (1.753 m)   Body mass index is 47.2 kg/m.   General: Well developed, well nourished. No acute distress. Extremities: 4+ edema of the lower legs, with chronic stasis dermatitis and serous fluid  drainage. Psych: Alert and oriented. Normal mood and affect.  Health Maintenance Due  Topic Date Due   Medicare Annual Wellness (AWV)  Never done   COVID-19 Vaccine (1) Never done   OPHTHALMOLOGY EXAM  Never done   Hepatitis C Screening  Never done   DTaP/Tdap/Td (1 - Tdap) Never done   Zoster Vaccines- Shingrix (2 of 2) 12/07/2023   Home glucose monitor: 7-day average: 256 14-day average: 238 30-day average: 227 90-day average: 242   Assessment & Plan:   Problem List Items Addressed This Visit       Cardiovascular and Mediastinum   Essential hypertension   Blood pressure normal today. I will plan to stop  his amlodipine  at this point, in light of his orthostasis.      Relevant Medications   amLODipine  (NORVASC ) 5 MG tablet   Heart Failure with Preserved EF (HCC) - Primary   Compensated. Weight is down 16 lbs from his last visit, likely due to dialysis. Continue carvedilol  25 mg bid, bumetanide  (Bumex ) 2 mg twice daily, and metolazone  5 mg every other day.      Relevant Medications   amLODipine  (NORVASC ) 5 MG tablet     Endocrine   Type 2 diabetes mellitus with diabetic chronic kidney disease (HCC)   Diabetes remains in poor control. Continue insulin  glargine (Lantus ) 60 units bid and insulin  protamine-aspart (Humalog ) 70/30 60 units bid. I iwll try to start him on semaglutide (Ozempic), as he had some success in improving his blood sugar and reducing his weight when he used this previously. Endocrinology appointment pending.      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        Other   Edema of both lower legs   Likely multifactorial. Continue bumetanide  and metolazone . Dialysis is helping to remove some excess fluid with time.      Mixed hyperlipidemia   Lipids are at goal. Continue  atorvastatin  80 mg daily.      Relevant Medications   amLODipine  (NORVASC ) 5 MG tablet   Other Visit Diagnoses       Essential (primary) hypertension       Relevant Medications   amLODipine  (NORVASC ) 5 MG tablet       Return in about 2 months (around 03/11/2024) for Reassessment.   Graig Lawyer, MD

## 2024-01-10 NOTE — Assessment & Plan Note (Signed)
 Blood pressure normal today. I will plan to stop his amlodipine  at this point, in light of his orthostasis.

## 2024-01-10 NOTE — Assessment & Plan Note (Signed)
 Likely multifactorial. Continue bumetanide  and metolazone . Dialysis is helping to remove some excess fluid with time.

## 2024-01-12 ENCOUNTER — Telehealth: Payer: Self-pay

## 2024-01-12 DIAGNOSIS — E1122 Type 2 diabetes mellitus with diabetic chronic kidney disease: Secondary | ICD-10-CM

## 2024-01-12 MED ORDER — TRULICITY 0.75 MG/0.5ML ~~LOC~~ SOAJ
0.7500 mg | SUBCUTANEOUS | 3 refills | Status: DC
Start: 2024-01-12 — End: 2024-01-14

## 2024-01-12 NOTE — Telephone Encounter (Signed)
 DOT form completed by provider and copies made.  Called patient to advise that it is ready for pick up as well as his BP machine is here that was delivered.  He will come by today to get this.    He did advise me that the Ozempic is too expensive for him and $200 a month.  Is there other options?  Please review and advise.  Thanks. Dm/cma

## 2024-01-12 NOTE — Addendum Note (Signed)
 Addended by: Graig Lawyer on: 01/12/2024 11:57 AM   Modules accepted: Orders

## 2024-01-13 NOTE — Telephone Encounter (Signed)
 Patient called back stating that the Trulicity is gong to be $700.   Please review and advise.  Thanks. Dm/cma

## 2024-01-13 NOTE — Telephone Encounter (Signed)
 Left detailed VM on patients phone regarding medication change.  Advised to call back if there are any concerns. Dm/cma

## 2024-01-13 NOTE — Telephone Encounter (Signed)
 Will call patient to advise.  Dm/cma

## 2024-01-14 ENCOUNTER — Telehealth: Payer: Self-pay

## 2024-01-14 MED ORDER — SITAGLIPTIN PHOSPHATE 25 MG PO TABS: 25.0000 mg | ORAL_TABLET | Freq: Every day | ORAL | 2 refills | Status: DC

## 2024-01-14 NOTE — Addendum Note (Signed)
 Addended by: Graig Lawyer on: 01/14/2024 08:21 AM   Modules accepted: Orders

## 2024-01-14 NOTE — Progress Notes (Addendum)
   01/14/2024  Patient ID: Peter Terry, male   DOB: [birth year], 68 y.o.   MRN: [id number]  Subjective/Objective Telephone visit to follow-up on management of diabetes and hypertension   Diabetes: Current medications: Lantus  60 units BID, Novolog  70/30 60 units BID with meals -Medications tried in the past: metformin caused decreased kidney function -Using Dexcom G7 for CGM and endorses BG dropping as low as 50 at times and going as high at >400 at times -Patient does not have consistent meals/snacks and will skip meals at times -Would like to lose weight, which would also improve glycemic control -Dr. Therese Flash recently prescribed Ozempic , but this medication and Trulicity  were not going to be affordable for him -Januvia  25mg  was sent to the pharmacy today, and patient has not picked this up; because it was going to be approximately $80 -Recently A1c 9.2% -Endocrinology appt scheduled for July  -Patient endorses insulins are expensive for him to fill/pick up- Lantus  has increased to $100 recently per pateient  Hypertension: Current medications: amlodipine  5mg  daily, bumetanide  2mg  BID, carvedilol  25mg  BID, hydralazine  25mg  TID, isosorbide  [place] ER 30mg  daily, losartan  100mg  daily, metolozone 5mg  daily  -Dr. Therese Flash recently decrease amlodipine  from 10mg  to 5mg  due to orthostasis -Patient has received BP monitor left for him at PCP office, but he has not yet started monitoring home BP readings  Assessment/Plan:    Diabetes: -Currently uncontrolled -Patient would qualify for Novo PAP based on [facility]; if approved, he could get Ozempic , Novolog  70/30 Mix, and pen needles at no cost.  Program also offers Tresiba U-100 or U-200 Flextouch if Dr. Therese Flash in agreement with changing patient from Lantus  60 units BID to [name] U-200 100 units daily (dose decrease recommended to decrease risk of hypoglycemia with medication change and addition of Ozempic )   Hypertension: -Currently moderately controlled -Continue  current regimen at this time -Monitor and record home BP regularly   Follow-up:  will f/u regarding Novo PAP and schedule follow-up visit accordingly   Linn Rich, PharmD, DPLA

## 2024-01-17 ENCOUNTER — Telehealth: Payer: Self-pay

## 2024-01-17 NOTE — Progress Notes (Signed)
   01/17/2024  Patient ID: [name], male   DOB: 1956/07/16, 68 y.o.   MRN: [id number]  Patient outreach to inform Mr. Byrns Dr. Therese Flash is in agreement with changing Lantus  to Horace Lye, so we can apply to get this, Novolog  70/30 Mix, pen needles and Ozempic  through Novo PAP.  Medication assistance team mailed patient portion to his home today, so I told him to be on the lookout for this.  Telephone follow-up in 2 week to check on status of PAP and patient BG readings.  Linn Rich, PharmD, DPLA

## 2024-01-17 NOTE — Telephone Encounter (Signed)
 PAP: Patient assistance application for Novolog  Mix 70/30, Ozempic , and Tresiba through [employer] has been mailed to [employer] home address on file. Provider portion of application will be faxed to provider's office.

## 2024-01-18 NOTE — Telephone Encounter (Signed)
 E-filed patient portion of [employer] pap application

## 2024-01-25 ENCOUNTER — Ambulatory Visit: Admitting: "Endocrinology

## 2024-01-27 ENCOUNTER — Other Ambulatory Visit: Payer: Self-pay | Admitting: Family Medicine

## 2024-01-27 DIAGNOSIS — K219 Gastro-esophageal reflux disease without esophagitis: Secondary | ICD-10-CM

## 2024-01-31 ENCOUNTER — Other Ambulatory Visit: Payer: Self-pay

## 2024-01-31 NOTE — Progress Notes (Signed)
   01/31/2024  Patient ID: Peter Terry, male   DOB: 04/30/1956, 68 y.o.   MRN: [id number]  Subjective/Objective Telephone visit to follow-up on management of diabetes and hypertension   Diabetes: Current medications: Lantus  30 units in the morning, Novolog  70/30 60 units in the morning with breafkast -Medications tried in the past: metformin caused decreased kidney function -Using Dexcom G7 for CGM and endorses BG dropping in the 50's at times, so he starting just using Lantus  and Novolog  70/30 mix once daily in the morning at breakfast, but does endorse some elevated readings in the afternoon. -Patient does not have consistent meals/snacks and will skip meals at times -Would like to lose weight, which would also improve glycemic control -Currently working on Novo PAP for Ozempic  0.25/0.5mg , Tresiba, Novolog  70/30 Mix and pen needles.  Patient's portion has been e-filed by medication assistance team, and provider portion was refaxed to Dr. Baldomero office today. -Recently A1c 9.2% -Endocrinology appt scheduled for July 22nd with Dr. Dartha   Hypertension: Current medications: amlodipine  5mg  daily, bumetanide  2mg  BID, carvedilol  25mg  BID, hydralazine  25mg  TID, isosorbide  [place] ER 30mg  daily, losartan  100mg  daily, metolozone 5mg  daily  -Dr. Thedora recently decrease amlodipine  from 10mg  to 5mg  due to orthostasis -Patient has received BP monitor left for him at PCP office, and endorses home BP averaging 129/78 -Orthostatis has improved with reduced amlodipine  5mg  dose   Assessment/Plan:    Diabetes: -Currently uncontrolled -Recommend to continue Lantus  60 units daily at breakfast, divide Novolog  70/30 mix to 30 units with breakfast and 30 units with supper -Continue Dexcom for CGM -Will follow-up with Dr. Thedora regarding provider portion of Novo PAP application -Will pass notes from next visit to Dr. Dartha prior to patient's 7/22 visit   Hypertension: -Currently moderately  controlled -Continue current regimen at this time -Monitor and record home BP regularly   Follow-up:  2 weeks   Peter Terry, PharmD, DPLA

## 2024-01-31 NOTE — Telephone Encounter (Signed)
 Re-faxed provider portion to office for signature.

## 2024-02-01 DIAGNOSIS — E1122 Type 2 diabetes mellitus with diabetic chronic kidney disease: Secondary | ICD-10-CM | POA: Diagnosis not present

## 2024-02-01 NOTE — Telephone Encounter (Signed)
 PAP: Application for Novolog  Mix 70/30, Ozempic , and [name] has been submitted to [employer], via fax

## 2024-02-02 ENCOUNTER — Ambulatory Visit

## 2024-02-03 ENCOUNTER — Ambulatory Visit

## 2024-02-03 NOTE — Telephone Encounter (Signed)
 PAP: Patient assistance application for Novolog  Mix 70/30, Ozempic , and [name] has been approved by PAP Companies: [employer] from 02/02/2024 to 08/09/2024. Medication should be delivered to PAP Delivery: Provider's office. For further shipping updates, please contact [employer] at [phone]. Patient ID is: [id number]

## 2024-02-03 NOTE — Progress Notes (Signed)
 Pharmacy Medication Assistance Program Note    02/03/2024  Patient ID: Peter Terry, male   DOB: Sep 26, 1955, 68 y.o.   MRN: [id number]     02/03/2024  Outreach Medication One  Manufacturer Medication One Novo [employer]isk  [facility] Novolog  70/30  Type of Radiographer, therapeutic Assistance  Date Application Sent to Patient 01/17/2024  Application Items Requested Application  Date Application Sent to Prescriber 01/17/2024  Name of Prescriber Garnette Simpler  Date Application Received From Provider 01/31/2024  Date Application Submitted to Manufacturer 01/31/2024  Patient Assistance Determination Approved  Approval Start Date 02/02/2024  Approval End Date 08/09/2024  Patient Notification Method MyChart     Signature    02/03/2024  Patient ID: Peter Terry, male  DOB: 04/25/1956, 68 y.o.  MRN:  [id number]     02/03/2024  Outreach Medication Two  Initial Outreach Date (Medication Two) 01/17/2024  Manufacturer Medication Two Novo Nordisk  [facility] Ozempic   Type of Radiographer, therapeutic Assistance  Date Application Sent to Patient 01/17/2024  Application Items Requested Application  Date Application Sent to Prescriber 01/17/2024  Name of Prescriber Garnette Simpler  Date Application Received From Patient 01/17/2024  Application Items Received From Patient Application  Date Application Received From Provider 01/31/2024  Method Application Sent to Manufacturer Fax  Date Application Submitted to Manufacturer 02/02/2024  Patient Assistance Determination Approved  Approval Start Date 02/02/2024  Patient Notification Method MyChart     Signature   02/03/2024  Patient ID: Peter Terry, male  DOB: 03-31-1956, 68 y.o.  MRN:  [id number]     02/03/2024  Outreach Medication Three  Manufacturer Medication Three [employer]o [employer]isk  [facility] Tresiba  Type of Radiographer, therapeutic Assistance  Date Application Sent to Patient 01/17/2024  Application Items Requested Application  Date  Application Sent to Prescriber 01/17/2024  Name of Prescriber Garnette Simpler  Date Application Received From Patient 01/17/2024  Application Items Received From Patient Application  Date Application Received From Provider 01/31/2024  Date Application Submitted to Manufacturer 02/02/2024  Method Application Sent to Manufacturer Fax  Patient Assistance Determination Approved  Approval Start Date 02/02/2024  Approval End Date 08/09/2024  Patient Notification Method MyChart

## 2024-02-04 ENCOUNTER — Other Ambulatory Visit: Payer: Self-pay

## 2024-02-04 NOTE — Patient Instructions (Signed)
 Visit Information  Thank you for taking time to visit with me today. Please don't hesitate to contact me if I can be of assistance to you before our next scheduled appointment.  Our next appointment is by telephone on 03/06/24 at 1:00 Please call the care guide team at [phone] if you need to cancel or reschedule your appointment.   Following is a copy of your care plan:   Goals Addressed             This Visit's Progress    VBCI RN Care Plan   Improving    Problems:  Chronic Disease Management support and education needs related to CHF, DMII, and ESRD  Goal: Over the next 30days the Patient will attend all scheduled medical appointments: 02/14/24 Pharmacy, 02/29/24 Endocrinology, 03/20/24 PCP as evidenced by chart review and patient report        continue to work with RN Care Manager and/or Social Worker to address care management and care coordination needs related to CHF, DMII, and ESRD as evidenced by adherence to care management team scheduled appointments     demonstrate a decrease CHF, DMII, and ESRD in exacerbations as evidenced by chart review, patient report of BP, weight, and blood sugar readings demonstrate Improved adherence to prescribed treatment plan for CHF, DMII, and ESRD as evidenced by taking medications on time as prescribed, monitoring daily weight and blood pressure and recording data to review with medical providers. Attending all appointments as scheduled. Lifestyle and dietary modifications as directed. demonstrate Improved health management independence as evidenced by chart review and patient report         experience decrease in ED visits as evidenced by electronic medical record review; ED visits in last 6 months = 2 not experience hospital admission as evidenced by review of electronic medical record. Hospital Admissions in last 6 months = 1 take all medications exactly as prescribed and will call provider for medication related questions as evidenced by  chart review and patient report     Interventions:   Heart Failure Interventions: Basic overview and discussion of pathophysiology of Heart Failure reviewed Provided education on low sodium diet Reviewed Heart Failure Action Plan in depth and provided written copy Assessed need for readable accurate scales in home Advised patient to weigh each morning after emptying bladder Discussed importance of daily weight and advised patient to weigh and record daily Reviewed role of diuretics in prevention of fluid overload and management of heart failure; Discussed the importance of keeping all appointments with provider   Chronic Kidney Disease Interventions: Last practice recorded BP readings:  BP Readings from Last 3 Encounters:  02/04/24 130/82  01/10/24 120/68  11/22/23 138/66   Most recent eGFR/CrCl:  Lab Results  Component Value Date   EGFR 20 (L) 06/14/2023    No components found for: CRCL  Patient Self-Care Activities:  Attend all scheduled provider appointments Call pharmacy for medication refills 3-7 days in advance of running out of medications Call provider office for new concerns or questions  Perform all self care activities independently  Perform IADL's (shopping, preparing meals, housekeeping, managing finances) independently Take medications as prescribed   Work with the social worker to address care coordination needs and will continue to work with the clinical team to address health care and disease management related needs Work with the pharmacist to address medication management needs and will continue to work with the clinical team to address health care and disease management related needs call office if I  gain more than 2 pounds in one day or 5 pounds in one week keep legs up while sitting track weight in diary use salt in moderation watch for swelling in feet, ankles and legs every day weigh myself daily bring diary to all appointments know when to call  the doctor:weight gain of more than 3 lbs in one day or 8 lbs in one week.  Plan:  Telephone follow up appointment with care management team member scheduled for:  03/06/24 at 1:00             Please call the Suicide and Crisis Lifeline: 988 call the [place]  National Suicide Prevention Lifeline: 6602180695 or TTY: (808) 750-3818 TTY (587) 494-1754) to talk to a trained counselor call 1-800-273-TALK (toll free, 24 hour hotline) if you are experiencing a Mental Health or Behavioral Health Crisis or need someone to talk to.  Patient verbalizes understanding of instructions and care plan provided today and agrees to view in MyChart. Active MyChart status and patient understanding of how to access instructions and care plan via MyChart confirmed with patient.      Olam Idol BSN RN CCM [place]  [facility], [facility] RN Care Manager Direct Dial: [phone] Fax: [phone]

## 2024-02-04 NOTE — Patient Outreach (Signed)
 Complex Care Management   Visit Note  02/04/2024  Name:  Peter Terry MRN: [id number] DOB: 08-11-1955  Situation: Referral received for Complex Care Management related to Heart Failure, ESRD, and Diabetes with Complications I obtained verbal consent from Patient.  Visit completed with patient  on the phone  Background:   Past Medical History:  Diagnosis Date   Acute heart failure with preserved ejection fraction (HFpEF) (HCC) 05/02/2023   Acute on chronic diastolic CHF (congestive heart failure) (HCC) 08/25/2023   Acute renal failure superimposed on stage 4 chronic kidney disease (HCC) 08/26/2023   Anemia of chronic disease 09/01/2023   Cellulitis of right lower extremity 09/01/2023   CHF (congestive heart failure) (HCC)    Chronic kidney disease, stage 4 (severe) (HCC) 03/31/2023   Chronic right hip pain 07/13/2023   COPD (chronic obstructive pulmonary disease) (HCC) 03/30/2023   Diabetic peripheral neuropathy (HCC) 03/31/2023   Dyspnea on exertion 05/02/2023   Edema of both lower legs 03/31/2023   Essential hypertension 03/30/2023   GERD (gastroesophageal reflux disease) 03/31/2023   Heart Failure with Preserved EF (HCC) 03/31/2023   Hypomagnesemia 11/04/2022   Insulin  long-term use (HCC) 08/21/2020   Iron  deficiency anemia 03/31/2022   Mixed hyperlipidemia 03/30/2023   Morbid obesity with BMI of 45.0-49.9, adult (HCC) 07/25/2020   Morbid obesity with body mass index (BMI) of 50.0 to 59.9 in adult (HCC) 07/25/2020   Multiple open wounds of lower extremity 05/02/2023   Obesity, class 3 09/01/2023   Obstructive sleep apnea 03/31/2023   Reactive depression 10/11/2023   Retinal tear 02/20/2013   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/11/2023   Tinea pedis 03/31/2023   Type 2 diabetes mellitus with diabetic neuropathy, unspecified (HCC) 11/04/2022   Type 2 diabetes mellitus with hyperglycemia, with long-term current use of insulin  (HCC) 05/02/2023   Type 2  diabetes mellitus with stage 4 chronic kidney disease, with long-term current use of insulin  (HCC) 11/04/2022   Venous stasis dermatitis 03/31/2023   Venous stasis ulcer of left calf limited to breakdown of skin without varicose veins (HCC) 07/13/2023    Assessment: Patient Reported Symptoms:  Cognitive Cognitive Status: Alert and oriented to person, place, and time      Neurological Neurological Review of Symptoms: Other: [place] Neurological Symptoms/Conditions [RPT]: takes gabapentin  as needed Neurological Management Strategies: Routine screening, Medication therapy  HEENT HEENT Symptoms Reported: No symptoms reported      Cardiovascular Cardiovascular Symptoms Reported: Other: Does patient have uncontrolled Hypertension?: Yes Is patient checking Blood Pressure at home?: Yes Patient's Recent BP reading at home: 130/82 Cardiovascular Conditions: Hypertension, Heart failure Cardiovascular Management Strategies: Medical device, Routine screening, Medication therapy, Diet modification, Adequate rest, Fluid modification Do You Have a Working Readable Scale?: Yes  Respiratory Respiratory Symptoms Reported: No symptoms reported    Endocrine Patient reports the following symptoms related to hypoglycemia or hyperglycemia : No symptoms reported Is patient diabetic?: Yes Is patient checking blood sugars at home?: Yes Endocrine Conditions: Diabetes Endocrine Management Strategies: Fluid modification, Medical device, Medication therapy, Routine screening, Diet modification, Adequate rest  Gastrointestinal Gastrointestinal Symptoms Reported: No symptoms reported      Genitourinary Genitourinary Symptoms Reported: No symptoms reported Genitourinary Conditions: End-stage renal disease Genitourinary Management Strategies: Hemodialysis Hemodialysis Last Treatment: 02/04/24  Integumentary Additional Integumentary Details: patient reports may need to have fistula flow is low Skin Conditions:  Wound Skin Management Strategies: Routine screening, Fluid modification, Medication therapy  Musculoskeletal   Musculoskeletal Conditions: Joint pain Musculoskeletal Management Strategies: Medication therapy  Falls in the past year?: No    Psychosocial Psychosocial Symptoms Reported: No symptoms reported     Do you feel physically threatened by others?: Yes      12/27/2023   11:09 AM  Depression screen PHQ 2/9  Decreased Interest 2  Down, Depressed, Hopeless 0  PHQ - 2 Score 2  Altered sleeping 2  Tired, decreased energy 3  Change in appetite 3  Feeling bad or failure about yourself  0  Trouble concentrating 0  Moving slowly or fidgety/restless 0  Suicidal thoughts 0  PHQ-9 Score 10    Vitals:   02/04/24 1502  BP: 130/82    Medications Reviewed Today     Reviewed by Lonzell Planas, RN (Registered Nurse) on 02/04/24 at 1505  Med List Status: <None>   Medication Order Taking? Sig Documenting Provider Last Dose Status Informant  albuterol  (VENTOLIN  HFA) 108 (90 Base) MCG/ACT inhaler [id number] Yes Inhale 2 puffs into the lungs every 4 (four) hours as needed for wheezing or shortness of breath. [provider]  Active Self, Pharmacy Records  amLODipine  (NORVASC ) 5 MG tablet [id number] Yes Take 1 tablet (5 mg total) by mouth daily. Thedora Garnette HERO, MD  Active   atorvastatin  (LIPITOR) 80 MG tablet [id number] Yes Take 1 tablet (80 mg total) by mouth at bedtime. Thedora Garnette HERO, MD  Active   bumetanide  (BUMEX ) 2 MG tablet [id number] Yes Take 2 mg by mouth 2 (two) times daily. [provider]  Active   carvedilol  (COREG ) 25 MG tablet [id number] Yes Take 1 tablet (25 mg total) by mouth 2 (two) times daily. Thedora Garnette HERO, MD  Active   gabapentin  (NEURONTIN ) 100 MG capsule [id number] Yes Take 1 capsule (100 mg total) by mouth 3 (three) times daily as needed (neuropathy pain). Thedora Garnette HERO, MD  Active   hydrALAZINE  (APRESOLINE ) 25 MG tablet [id number]  TAKE 3 TABLETS (75  MG TOTAL) BY MOUTH 3 TIMES A DAY  Patient not taking: Reported on 02/04/2024   Thedora Garnette HERO, MD  Active   insulin  aspart protamine - aspart (NOVOLOG  70/30 MIX) (70-30) 100 UNIT/ML FlexPen [id number] Yes Inject 60 Units into the skin 2 (two) times daily with a meal. Thedora Garnette HERO, MD  Active   insulin  glargine (LANTUS ) 100 UNIT/ML Solostar Pen [id number] Yes Inject 60 Units into the skin 2 (two) times daily. Thedora Garnette HERO, MD  Active   Insulin  Pen Needle (B-D UF III MINI PEN NEEDLES) 31G X 5 MM MISC [id number] Yes Use to inject insulin  4 times daily Thedora Garnette HERO, MD  Active   isosorbide  mononitrate (IMDUR ) 30 MG 24 hr tablet [id number] Yes TAKE 1 TABLET BY MOUTH EVERY DAY Thedora Garnette HERO, MD  Active   losartan  (COZAAR ) 100 MG tablet [id number] Yes Take 100 mg by mouth daily. [provider]  Active   metolazone  (ZAROXOLYN ) 5 MG tablet [id number] Yes Take 5 mg by mouth daily. [provider]  Active   omeprazole  (PRILOSEC) 20 MG capsule [id number] Yes TAKE 1 CAPSULE BY MOUTH EVERY DAY Thedora Garnette HERO, MD  Active   traMADol  (ULTRAM ) 50 MG tablet [id number] Yes TAKE 1 TABLET BY MOUTH EVERY 8 HOURS AS NEEDED. Thedora Garnette HERO, MD  Active             Recommendation:   Continue Current Plan of Care  Follow Up Plan:   Telephone follow up appointment date/time:  03/06/24 at 1:00   Planas Lonzell BSN RN CCM [place]  [facility], [facility] RN Care Manager Direct Dial: [phone] Fax: [phone]

## 2024-02-07 DIAGNOSIS — N186 End stage renal disease: Secondary | ICD-10-CM | POA: Diagnosis not present

## 2024-02-07 DIAGNOSIS — Z992 Dependence on renal dialysis: Secondary | ICD-10-CM | POA: Diagnosis not present

## 2024-02-14 ENCOUNTER — Other Ambulatory Visit: Payer: Self-pay

## 2024-02-14 NOTE — Progress Notes (Unsigned)
   02/14/2024  Patient ID: Peter Terry, male   DOB: 09-02-1955, 68 y.o.   MRN: [id number]  Outreach attempt for scheduled telephone visit to follow-up on control of diabetes and hypertension was unsuccessful.  I was able to leave a HIPAA compliant voicemail with my direct phone number, and I am sending a MyChart message to attempt to reschedule visit.  Channing DELENA Mealing, PharmD, DPLA

## 2024-02-15 DIAGNOSIS — R918 Other nonspecific abnormal finding of lung field: Secondary | ICD-10-CM | POA: Diagnosis not present

## 2024-02-15 DIAGNOSIS — Z794 Long term (current) use of insulin: Secondary | ICD-10-CM | POA: Diagnosis not present

## 2024-02-15 DIAGNOSIS — I5030 Unspecified diastolic (congestive) heart failure: Secondary | ICD-10-CM | POA: Diagnosis not present

## 2024-02-15 DIAGNOSIS — I959 Hypotension, unspecified: Secondary | ICD-10-CM | POA: Diagnosis not present

## 2024-02-15 DIAGNOSIS — I129 Hypertensive chronic kidney disease with stage 1 through stage 4 chronic kidney disease, or unspecified chronic kidney disease: Secondary | ICD-10-CM | POA: Diagnosis not present

## 2024-02-15 DIAGNOSIS — I2489 Other forms of acute ischemic heart disease: Secondary | ICD-10-CM | POA: Diagnosis not present

## 2024-02-15 DIAGNOSIS — R1084 Generalized abdominal pain: Secondary | ICD-10-CM | POA: Diagnosis not present

## 2024-02-15 DIAGNOSIS — I953 Hypotension of hemodialysis: Secondary | ICD-10-CM | POA: Diagnosis not present

## 2024-02-15 DIAGNOSIS — N186 End stage renal disease: Secondary | ICD-10-CM | POA: Diagnosis not present

## 2024-02-15 DIAGNOSIS — J449 Chronic obstructive pulmonary disease, unspecified: Secondary | ICD-10-CM | POA: Diagnosis not present

## 2024-02-15 DIAGNOSIS — E1165 Type 2 diabetes mellitus with hyperglycemia: Secondary | ICD-10-CM | POA: Diagnosis not present

## 2024-02-15 DIAGNOSIS — L03116 Cellulitis of left lower limb: Secondary | ICD-10-CM | POA: Diagnosis not present

## 2024-02-15 DIAGNOSIS — T829XXA Unspecified complication of cardiac and vascular prosthetic device, implant and graft, initial encounter: Secondary | ICD-10-CM | POA: Diagnosis not present

## 2024-02-15 DIAGNOSIS — Z20822 Contact with and (suspected) exposure to covid-19: Secondary | ICD-10-CM | POA: Diagnosis not present

## 2024-02-15 DIAGNOSIS — Z6841 Body Mass Index (BMI) 40.0 and over, adult: Secondary | ICD-10-CM | POA: Diagnosis not present

## 2024-02-15 DIAGNOSIS — T82510A Breakdown (mechanical) of surgically created arteriovenous fistula, initial encounter: Secondary | ICD-10-CM | POA: Diagnosis not present

## 2024-02-15 DIAGNOSIS — E871 Hypo-osmolality and hyponatremia: Secondary | ICD-10-CM | POA: Diagnosis not present

## 2024-02-15 DIAGNOSIS — E1140 Type 2 diabetes mellitus with diabetic neuropathy, unspecified: Secondary | ICD-10-CM | POA: Diagnosis not present

## 2024-02-15 DIAGNOSIS — R9431 Abnormal electrocardiogram [ECG] [EKG]: Secondary | ICD-10-CM | POA: Diagnosis not present

## 2024-02-15 DIAGNOSIS — D638 Anemia in other chronic diseases classified elsewhere: Secondary | ICD-10-CM | POA: Diagnosis not present

## 2024-02-15 DIAGNOSIS — Z992 Dependence on renal dialysis: Secondary | ICD-10-CM | POA: Diagnosis not present

## 2024-02-15 DIAGNOSIS — Z4901 Encounter for fitting and adjustment of extracorporeal dialysis catheter: Secondary | ICD-10-CM | POA: Diagnosis not present

## 2024-02-15 DIAGNOSIS — I890 Lymphedema, not elsewhere classified: Secondary | ICD-10-CM | POA: Diagnosis not present

## 2024-02-15 DIAGNOSIS — R0600 Dyspnea, unspecified: Secondary | ICD-10-CM | POA: Diagnosis not present

## 2024-02-15 DIAGNOSIS — I700 Atherosclerosis of aorta: Secondary | ICD-10-CM | POA: Diagnosis not present

## 2024-02-15 DIAGNOSIS — R7881 Bacteremia: Secondary | ICD-10-CM | POA: Diagnosis not present

## 2024-02-15 DIAGNOSIS — E1122 Type 2 diabetes mellitus with diabetic chronic kidney disease: Secondary | ICD-10-CM | POA: Diagnosis not present

## 2024-02-15 DIAGNOSIS — Z452 Encounter for adjustment and management of vascular access device: Secondary | ICD-10-CM | POA: Diagnosis not present

## 2024-02-15 DIAGNOSIS — I132 Hypertensive heart and chronic kidney disease with heart failure and with stage 5 chronic kidney disease, or end stage renal disease: Secondary | ICD-10-CM | POA: Diagnosis not present

## 2024-02-15 DIAGNOSIS — J441 Chronic obstructive pulmonary disease with (acute) exacerbation: Secondary | ICD-10-CM | POA: Diagnosis not present

## 2024-02-15 DIAGNOSIS — I517 Cardiomegaly: Secondary | ICD-10-CM | POA: Diagnosis not present

## 2024-02-15 DIAGNOSIS — D649 Anemia, unspecified: Secondary | ICD-10-CM | POA: Diagnosis not present

## 2024-02-15 DIAGNOSIS — I5043 Acute on chronic combined systolic (congestive) and diastolic (congestive) heart failure: Secondary | ICD-10-CM | POA: Diagnosis not present

## 2024-02-15 DIAGNOSIS — E8770 Fluid overload, unspecified: Secondary | ICD-10-CM | POA: Diagnosis not present

## 2024-02-15 DIAGNOSIS — D631 Anemia in chronic kidney disease: Secondary | ICD-10-CM | POA: Diagnosis not present

## 2024-02-15 DIAGNOSIS — E669 Obesity, unspecified: Secondary | ICD-10-CM | POA: Diagnosis not present

## 2024-02-15 DIAGNOSIS — K219 Gastro-esophageal reflux disease without esophagitis: Secondary | ICD-10-CM | POA: Diagnosis not present

## 2024-02-15 DIAGNOSIS — R0609 Other forms of dyspnea: Secondary | ICD-10-CM | POA: Diagnosis not present

## 2024-02-15 DIAGNOSIS — I440 Atrioventricular block, first degree: Secondary | ICD-10-CM | POA: Diagnosis not present

## 2024-02-15 DIAGNOSIS — Z981 Arthrodesis status: Secondary | ICD-10-CM | POA: Diagnosis not present

## 2024-02-15 DIAGNOSIS — Y712 Prosthetic and other implants, materials and accessory cardiovascular devices associated with adverse incidents: Secondary | ICD-10-CM | POA: Diagnosis not present

## 2024-02-15 DIAGNOSIS — R0602 Shortness of breath: Secondary | ICD-10-CM | POA: Diagnosis not present

## 2024-02-15 DIAGNOSIS — R600 Localized edema: Secondary | ICD-10-CM | POA: Diagnosis not present

## 2024-02-15 DIAGNOSIS — Y828 Other medical devices associated with adverse incidents: Secondary | ICD-10-CM | POA: Diagnosis not present

## 2024-02-15 DIAGNOSIS — R0989 Other specified symptoms and signs involving the circulatory and respiratory systems: Secondary | ICD-10-CM | POA: Diagnosis not present

## 2024-02-15 DIAGNOSIS — T82590A Other mechanical complication of surgically created arteriovenous fistula, initial encounter: Secondary | ICD-10-CM | POA: Diagnosis not present

## 2024-02-15 NOTE — Discharge Summary (Signed)
 ------------------------------------------------------------------------------- Attestation signed by Charmaine Stuart Ades, MD at 02/27/2024  7:44 AM I saw and evaluated the patient on the day of discharge. I have reviewed the resident's note, and I agree with the summary of the patient's hospitalization and discharge plan as outlined below.    A total of 25 minutes of my time was spent on reviewing the patients chart including recent vitals and lab/test results, talking with and examining the patient, discussing the discharge plan with the patient and my medical team, preparing and reviewing the med rec, providing discharge instructions, and preparing discharge records.  Electronically Signed by: Charmaine Stuart Ades, MD, Attending Physician 02/27/2024 7:43 AM  -------------------------------------------------------------------------------  General Medicine [place] [facility] Discharge Summary   Name: Sheddrick Lattanzio MRN: [id number] Age: 68 yrs DOB: May 03, 1956  Admit date: 02/15/2024 Discharge date: 02/26/24   Admitting Physician: Franky Carlin Courts, MD Discharge Physician: Charmaine Stuart Ades, MD  Admission Diagnoses:  End stage renal disease    (CMD) [N18.6] ESRD (end stage renal disease)    (CMD) [N18.6] Dyspnea and respiratory abnormality [R06.00, R06.89] COPD exacerbation    (CMD) [J44.1] Chronic anemia [D64.9]   Discharge Diagnoses:  ESRD on HD, missed dialysis HFpEF exacerbation LLE Cellulitis AV fistula dysfunction Diabetes mellitus, type II Hypertension OSA, unable to tolerate CPAP  Admission Condition: poor Discharged Condition: fair  Hospital Course:  For full details, please see H&P, progress notes, consult notes and ancillary notes. Briefly, HFpEF, COPD, HTN, T2DM, HLD, ESRD on HD, GERD, OSA (not using cPAP) presented after missing dialysis and was found to have HFpEF exacerbation and LLE cellulitis. Hospital course will be addressed in a problem based format as  below.  #ESRD on HD #AV fistula dysfunction Patient presented after missing HD. Nephrology was consulted and directed HD while inpatient.  There was noted occlusion of AV fistula site.  Fistulogram showed widely patent fistula without outflow occlusion or stenosis, though a large competing outflow vein in distal forearm limiting maturation.  While waiting for the fistulogram, a Vas-Cath was placed for inpatient dialysis needs.  Vascular surgery consulted, who recommended obtaining AVF duplex that revealed good flow volumes. HD was attempted again via AVF without success and a PermCath was placed with plans for outpatient vascular surgery follow up on 03/13/24 with repeat fistulogram and AVF revision. Patient was consented prior to discharge. PT/OT with recommendation for OP PT/OT at home.   #HFpEF exacerbation Patient presented with orthopnea and dyspnea on exertion. Trop peaked at 55, likely demand ischemia. TTE showed LVEF 60-65%.  Symptoms improved with HD and diuresis.  At discharge, nephrology recommended discontinue metolazone  and only continue on Bumex .   #LLE cellulitis Patient presented with LLE cellulitis with chronic wound that was warm and tender to touch. No systemic symptoms but with leukocytosis on admission. IV vancomycin was initiated on admission with improvement of LLE cellulitis and then transitioned to doxycycline for a 10 day course EOT 02/26/24. Wound care was consulted and recommended light compression and to keep legs elevated at rest. Upon discharge, LLE without erythema, open wounds or weeping tissue.   #Blood culture contaminant Blood culture 1/2 positive for Staphylococcus pettenkoferi, likely 2/2 containment.  Vancomycin was then discontinued.  Repeat cultures were negative.   #Hypertension #Episode of hypotension On 7/16, patient developed symptomatic hypotension with BP 86/64, likely 2/2 large dialysis UF volumes. Symptoms and BP improved with small IV fluid bolus and  holding of antihypertensives. Prior to discharge, coreg  was restarted and tolerated. Amlodipine , hydralazine , imdur  and losartan   were all held given low pressures during dialysis. Please reassess for need.  #Type II diabetes with hyperglycemia On 7/18, patient's glucose began climbing to 479 with associated nausea, dizziness.  BMP with anion gap, however BHB negative and VBG with normal pH, decreasing concern for DKA.  Fluids deferred due to ESRD.  Symptoms improved with glucose management with subcutaneous insulin .  Hyperglycemia likely due to decreased insulin  regimen while in the hospital.  Will discharge on home home regimen.  On day of discharge, patient is clinically stable with no new examination findings or acute symptoms compared to prior.  The patient was seen by the attending physician on the date of discharge and deemed stable and acceptable for discharge.  The patient's chronic medical conditions were treated accordingly per the patient's home medication regimen.  The patient's medication reconciliation, follow-up appointments, discharge orders, instructions, and significant lab and diagnostic studies are as noted.   Discharge Follow-up Action Items: Hospitalist at home 7/23 for BMP, CBC to monitor WBC, BUN, and anion gap, as well as examine lower extremities Follow up with PCP in 1-2 weeks (f/u for cellulitis, HFpEF, HTN) Follow up with nephrology as scheduled. Please repeat labs at next dialysis session Follow up with vascular surgery Follow up with endocrinology for diabetes management Follow up with cardiology for HFpEF Incidental findings: Aortic atherosclerosis  Medication Changes: Discontinue metolazone   Hold amlodipine , hydralazine , imdur  and losartan . Reassess for need.  Wound Care Instructions: LLE wound  Cleanse BLE with cleansing foam. Gently pat dry. Applied protective ointment to periwound tissue and right lower leg to protect and moisturize to decrease dry cracking  skin. Apply Mepilex ag foam over left lower leg wound  secure with kerlex from base of toes to knee and ace wrap in same fashion. Apply mepilex ag foam to right lower leg, lateral aspect secure with kerlex from base of toes to knee and ace wrap in same fashion. Change BLE dsg/wraps every Monday and Thursday Float heels off mattress when in bed.   Patient's Ordered Code Status: Full Code     Consults: IP CONSULT TO NEPHROLOGY IP CONSULT TO HOSPITALIST WOUND OSTOMY EVAL AND TREAT WOUND OSTOMY EVAL AND TREAT IP CONSULT TO VASCULAR SURGERY  Significant Diagnostic Studies:  CBC  Recent Labs    02/26/24 1608  WBC 11.61*  RBC 4.38*  HGB 11.6*  HCT 35.6*  MCV 81.3  MCH 26.4*  MCHC 32.5*  RDW 17.1*  PLT 210  MPV 7.8   CMP  Recent Labs    02/26/24 1608  NA 130*  K 4.2  CL 94*  CO2 23  BUN 40*  CALCIUM  9.3  CREATININE 7.41*    Transthoracic echo (TTE) complete  Final Result by Gerhardt Mana, MD (07/09 1051)                                                                                                          Version  1  Study ID  [id number]                                                                     +--------------------------------------------------+                             +----------+                                                                                                                                                    [facility]                                                                                                                                 [facility]                                         +--------------------------------------------------+                                                                                                                                                   +----------+   [facility]  [address]  Transthoracic Echocardiogram Report  Name  BRAXDEN, LOVERING [facility]  Study Date  02-16-2024,   8  10 AM                   Height  69.02 in  MRN  [id number]                                      Patient Location    [id number]                       Weight  271.609 lb  DOB  07/27/1956  [id number]                        Birth Gender  Male                                   BSA  2.35 m  Age  10 Years                                       Ethnicity  3                                        BP  132 - 63 mmHg                                                                                                          HR  68 bpm  Reason For Study  concern for hfpef exacerbation  Ordering Physician  GUO, ALYSSA AIHUI  Performed By  ZELL DENNIS  Referring Physician  GEORGIANNA MARDY COUGH  PROCEDURE  Image Quality  Technically adequate.  The left ventricular size is normal.  Moderate left ventricular hypertrophy  LV ejection fraction = 60-65%.  The right ventricle is normal size.  The right ventricular systolic function is normal.  IVC size was normal.  There is no pericardial effusion.  No apparent valvular abnormalities  LEFT VENTRICLE  The left ventricular size is normal. Moderate left ventricular   hypertrophy. LV ejection fraction =  60-65%.  RIGHT VENTRICLE  The right ventricle is normal size. The right ventricular systolic   function is normal.  LEFT ATRIUM  The left atrial size is normal.  RIGHT ATRIUM  Right atrial size is normal. There is no Doppler evidence for a patent   foramen ovale.  AORTIC VALVE  Structurally normal aortic valve. There is no aortic stenosis. There is no   aortic regurgitation.  MITRAL VALVE  There is mild mitral annular calcification. There is no mitral   regurgitation noted.  TRICUSPID VALVE  Structurally normal tricuspid valve. No tricuspid regurgitation.  PULMONIC VALVE   Structurally normal pulmonic valve. There is no pulmonic valvular   regurgitation.  There is no  pulmonic valvular stenosis.  ARTERIES  The aortic sinus is normal size. The ascending aorta is normal size.  VENOUS  IVC size was normal.  EFFUSION  There is no pericardial effusion.  MMode-2D Measurements & Calculations  asc Aorta Diam  2.9 cm                EDV MOD-sp2   97.2 ml                   EDV MOD-sp4   125.0 ml    EF A4C  65.9 %  ESV MOD-sp2   42.5 ml                 ESV MOD-sp4   42.6 ml                   IVSd  1.50 cm             LA dim  3.4 cm  LA ESV  BP   49.4 ml                  LA ESV Index  A2C   22.8 ml-m         LA ESV Index  A4C   16.9  ml-m        LA ESV Index  BP   21.0 ml-m  LVIDd  4.0 cm                          LVIDs  2.6 cm                            LVPWd  1.52 cm          SV A4C  82.4 ml  Doppler Measurements & Calculations  E-Lat E  7.1                         E-Med E  8.8                            Lat Peak E  Vel  10.3  cm-sec           Med Peak E  Vel  8.4 cm-sec  MV A max vel  85.9 cm-sec              MV dec time  0.18 sec                    MV E max vel  73.4  cm-sec  Other Measurements & Calculations  BSA  2.35 m                          MV E-A  0.85                            SI MOD-sp4   35.0 ml-m   SV MOD-sp4   82.4 ml  ___________________________________________________________________________  ___                                          MD Gerhardt Mana, MD, [phone]  02-16-2024, 10  51 AM    XR Chest 1 View  Final Result by Delmar Herbert Winfred Booker Results In [place] [id number] (07/08 1200)  CLINICAL DATA:  Shortness of breath.    EXAM:  CHEST  1 VIEW    COMPARISON:  Short of dated 08/25/2023    FINDINGS:  No focal consolidation, pleural effusion, pneumothorax. Stable  cardiac silhouette. No osseous pathology.    IMPRESSION:  No active disease.      Electronically Signed    By: Vanetta Chou M.D.    On: 02/15/2024 12:00          Disposition: Home with home health   Patient Instructions:    Medication List     PAUSE taking these medications    amLODIPine  10 mg tablet Wait to take this until your doctor or other care provider tells you to start again. Commonly known as: NORVASC  Take 10 mg by mouth every morning.   hydrALAZINE  25 mg tablet Wait to take this until your doctor or other care provider tells you to start again. Commonly known as: APRESOLINE  Take 25 mg by mouth 3 (three) times a day.   isosorbide  mononitrate 30 mg 24 hr tablet Wait to take this until your doctor or other care provider tells you to start again. Commonly known as: IMDUR  Take 1 tablet by mouth daily.   losartan  100 mg tablet Wait to take this until your doctor or other care provider tells you to start again. Commonly known as: COZAAR  Take 100 mg by mouth daily.       CONTINUE taking these medications    atorvastatin  80 mg tablet Commonly known as: LIPITOR Take 80 mg by mouth at bedtime.   bumetanide  2 mg tablet Commonly known as: BUMEX  Take 1 tablet (2 mg total) by mouth 2 (two) times a day.   carvediloL  25 mg tablet Commonly known as: COREG  Take 25 mg by mouth 2 (two) times a day.   gabapentin  100 mg capsule Commonly known as: NEURONTIN  Take 100 mg by mouth 3 (three) times a day.   insulin  aspart protamine-insulin  aspart 100 unit/mL (70-30) injection Commonly known as: NovoLOG  70/30 Inject 60 Units under the skin in the morning and 60 Units in the evening. Inject before meals.   omeprazole  20 mg DR capsule Commonly known as: PriLOSEC Take 20 mg by mouth daily.   ProAir  RespiClick 90 mcg/actuation Aepb inhaler Generic drug: albuterol  sulfate Inhale 2 puffs every 6 (six) hours as needed (wheezing).   traMADoL  50 mg tablet Commonly known as: ULTRAM  Take 50 mg by mouth as needed.   Trulicity  0.75 mg/0.5 mL subcutaneous pen injector Generic drug: dulaglutide  Inject 0.75 mg under the skin every 7  days.       STOP taking these medications    metOLazone  5 mg tablet Commonly known as: ZAROXOLYN          Where to Get Your Medications     These medications were sent to [facility]  [address], [place] [address]    Hours: Mon-Fri 8:30am-5pm; Sat-Sun: Closed; Holidays: Closed Phone: [phone]  bumetanide  2 mg tablet      Discharge Orders     Ambulatory referral to Telehospitalist     Comments: Monitor labs (CBC/BMP)   Full Code     Home Health Skilled Nursing     Details:    Home Health Skilled Nursing Service:  Wound Care Medication Education     Medication Education:  Observation and Assessment with Medication Education and Mgmt   Education on Wound Care: Yes   Monitoring for Healing: Yes   Wound Location: LLE   Dressing Change Type: Other   Dressing Change Frequency: Monday/thursday   Wound Other Instructions/Comments: see comments   Comments: Apply Mepilex ag foam over left lower leg wound  secure with kerlex from base of toes to knee and ace wrap in same fashion. Apply mepilex ag foam to right lower leg, lateral aspect secure with kerlex from base of toes to knee and ace wrap in same fashion. Change BLE dsg/wraps every Monday and Thursday Float heels off mattress when in bed.   Occupational Therapy Home Health Coordination     Details:    Actions: Evaluate and Treat   Physical Therapy Home Health Coordination     Details:    Actions: Evaluate and Treat       Follow-up  No future appointments.  Electronically signed by: Ronita Romance, MD, Resident, 02/26/2024 5:55 PM

## 2024-02-18 DIAGNOSIS — Z789 Other specified health status: Secondary | ICD-10-CM | POA: Insufficient documentation

## 2024-02-18 NOTE — Progress Notes (Signed)
 [facility] Nephrology Progress Note  Follow up for ESRD, fluid overload.  Subjective: Interval History: No acute events overnight.                             1 out of 2 blood cultures from 02/16/2024 growing gram-positive cocci in clusters.                             Unable to get fistulogram due to positive blood cultures.                             Last HD on 02/17/2024.  Postweight of 125 kg (275 pounds).   Review of Systems: negative except as noted above  Objective:   Vital signs for last 24 hours: Temp:  [97.9 F (36.6 C)-99.5 F (37.5 C)] 97.9 F (36.6 C) Heart Rate:  [71-80] 71 Resp:  [17-20] 17 BP: (126-155)/(68-82) 127/82  Intake/Output last 24 hours:  Intake/Output Summary (Last 24 hours) at 02/18/2024 1541 Last data filed at 02/18/2024 1200 Gross per 24 hour  Intake 635 ml  Output 600 ml  Net 35 ml    Medications: Scheduled Meds: atorvastatin , 80 mg, oral, At Bedtime bumetanide , 2 mg, oral, BID carvediloL , 25 mg, oral, BID gabapentin , 100 mg, oral, TID heparin (porcine), 5,000 Units, subcutaneous, Q8H SCH hydrALAZINE , 25 mg, oral, TID insulin  glargine, 30 Units, subcutaneous, QAM insulin  aspart (NovoLOG )/insulin  lispro (HumaLOG ) injection, 0-10 Units, subcutaneous, With meals & at bedtime isosorbide  mononitrate, 60 mg, oral, Daily omeprazole , 20 mg, oral, Daily polyethylene glycol, 17 g, oral, Daily vancomycin, 10 mg/kg (Adjusted), intravenous, Once per day on Tuesday Thursday Saturday Pharmacy to Dose Vancomycin, , miscellaneous, Pharmacy to Manage    PRN Meds: .  cellulose, oxidized .  dextrose  .  dextrose  .  ipratropium-albuteroL  .  melatonin .  [Held by provider] polyethylene glycol .  sodium chloride    Physical Exam: General appearance: Alert elderly man sitting out of bed.  Not in acute distress. Head: normocephalic, atraumatic Eyes: Sclera anicteric, no conjunctival pallor Lungs: Reduced breath sounds with few bibasilar rales.  No  wheeze. Heart: S1-S2 present. Abdomen: Obese, soft, no tenderness.  Bowel sounds present. Extremities: No cyanosis.  1-2+ bilateral lower extremity edema. Skin: Skin color, texture, turgor normal. No rashes or lesions Neurologic: alert and oriented times 3  Access: Left forearm AV fistula.  Labs Recent Results (from the past 48 hours)  POC Glucose   Collection Time: 02/16/24  4:09 PM  Result Value Ref Range   Glucose, POC 261 (H) 70 - 99 mg/dL  Hepatitis C Virus (HCV) Quantitative Real-Time PCR (Plasma)   Collection Time: 02/16/24  4:27 PM  Result Value Ref Range   Hepatitis C Virus (HCV) RNA Not Detected Not Detected  POC Glucose   Collection Time: 02/16/24  8:13 PM  Result Value Ref Range   Glucose, POC 199 (H) 70 - 99 mg/dL  Urinalysis with Reflex to Microscopic   Collection Time: 02/16/24 10:24 PM  Result Value Ref Range   Color, Urine Yellow Yellow   Clarity, Urine Clear Clear   Specific Gravity, Urine 1.019 1.005 - 1.025   pH, Urine 5.5 5.0 - 8.0   Protein, Urine 50 (A) Negative, 10 , 20  mg/dL   Glucose, Urine Negative Negative, 30 , 50  mg/dL   Ketones, Urine Negative Negative, Trace mg/dL  Bilirubin, Urine Negative Negative   Blood, Urine Trace Negative, Trace   Nitrite, Urine Negative Negative   Leukocyte Esterase, Urine 75 (A) Negative, 25   Urobilinogen, Urine Normal <2.0 mg/dL   WBC, Urine 3-87 (A) <6 /HPF   RBC, Urine 3-5 (A) 0 - 2 /HPF   Bacteria, Urine Rare None Seen, Rare /HPF   Squamous Epithelial Cells, Urine 0-5 0 - 5 /HPF   Hyaline Casts, Urine 0-2 0 - 2 /LPF  Basic Metabolic Panel   Collection Time: 02/17/24  4:00 AM  Result Value Ref Range   Sodium 133 (L) 136 - 145 mmol/L   Potassium 3.5 3.4 - 4.5 mmol/L   Chloride 96 (L) 98 - 107 mmol/L   CO2 26 21 - 31 mmol/L   Anion Gap 11 6 - 14 mmol/L   Glucose, Random 182 (H) 70 - 99 mg/dL   Blood Urea Nitrogen (BUN) 53 (H) 7 - 25 mg/dL   Creatinine 3.68 (H) 9.29 - 1.30 mg/dL   eGFR 9 (L) >40  fO/fpw/8.26f7   Calcium  7.8 (L) 8.6 - 10.3 mg/dL   BUN/Creatinine Ratio 8.4 (L) 10.0 - 20.0  Magnesium   Collection Time: 02/17/24  4:00 AM  Result Value Ref Range   Magnesium 2.7 1.9 - 2.7 mg/dL  Phosphorus   Collection Time: 02/17/24  4:00 AM  Result Value Ref Range   Phosphorus 4.6 2.5 - 5.0 mg/dL  CBC without Differential   Collection Time: 02/17/24  4:00 AM  Result Value Ref Range   WBC 9.11 4.40 - 11.00 10*3/uL   RBC 3.90 (L) 4.50 - 5.90 10*6/uL   Hemoglobin 10.6 (L) 14.0 - 17.5 g/dL   Hematocrit 68.3 (L) 58.4 - 50.4 %   Mean Corpuscular Volume (MCV) 81.1 80.0 - 96.0 fL   Mean Corpuscular Hemoglobin (MCH) 27.0 (L) 27.5 - 33.2 pg   Mean Corpuscular Hemoglobin Conc (MCHC) 33.4 33.0 - 37.0 g/dL   Red Cell Distribution Width (RDW) 17.2 (H) 12.3 - 17.0 %   Platelet Count (PLT) 186 150 - 450 10*3/uL   Mean Platelet Volume (MPV) 8.1 6.8 - 10.2 fL  POC Glucose   Collection Time: 02/17/24  7:37 AM  Result Value Ref Range   Glucose, POC 179 (H) 70 - 99 mg/dL  Vancomycin Level, Random   Collection Time: 02/17/24  7:58 AM  Result Value Ref Range   Vancomycin Level, Random 9.8 ug/mL  POC Glucose   Collection Time: 02/17/24 12:53 PM  Result Value Ref Range   Glucose, POC 192 (H) 70 - 99 mg/dL  Urine Culture   Collection Time: 02/17/24 12:57 PM   Specimen: Clean Catch; Urine  Result Value Ref Range   Urine Culture      Culture is currently in progress and will be updated upon organism growth, which is typically 24-48 hours following culture.  POC Glucose   Collection Time: 02/17/24  4:46 PM  Result Value Ref Range   Glucose, POC 193 (H) 70 - 99 mg/dL  POC Glucose   Collection Time: 02/17/24  8:40 PM  Result Value Ref Range   Glucose, POC 178 (H) 70 - 99 mg/dL  Basic Metabolic Panel   Collection Time: 02/18/24  1:02 AM  Result Value Ref Range   Sodium 132 (L) 136 - 145 mmol/L   Potassium 3.6 3.4 - 4.5 mmol/L   Chloride 96 (L) 98 - 107 mmol/L   CO2 25 21 - 31 mmol/L    Anion Gap 11 6 -  14 mmol/L   Glucose, Random 143 (H) 70 - 99 mg/dL   Blood Urea Nitrogen (BUN) 42 (H) 7 - 25 mg/dL   Creatinine 4.46 (H) 9.29 - 1.30 mg/dL   eGFR 11 (L) >40 fO/fpw/8.26f7   Calcium  7.8 (L) 8.6 - 10.3 mg/dL   BUN/Creatinine Ratio 7.6 (L) 10.0 - 20.0  Magnesium   Collection Time: 02/18/24  1:02 AM  Result Value Ref Range   Magnesium 2.3 1.9 - 2.7 mg/dL  Phosphorus   Collection Time: 02/18/24  1:02 AM  Result Value Ref Range   Phosphorus 4.6 2.5 - 5.0 mg/dL  CBC without Differential   Collection Time: 02/18/24  1:02 AM  Result Value Ref Range   WBC 9.73 4.40 - 11.00 10*3/uL   RBC 3.88 (L) 4.50 - 5.90 10*6/uL   Hemoglobin 10.2 (L) 14.0 - 17.5 g/dL   Hematocrit 68.5 (L) 58.4 - 50.4 %   Mean Corpuscular Volume (MCV) 80.9 80.0 - 96.0 fL   Mean Corpuscular Hemoglobin (MCH) 26.3 (L) 27.5 - 33.2 pg   Mean Corpuscular Hemoglobin Conc (MCHC) 32.5 (L) 33.0 - 37.0 g/dL   Red Cell Distribution Width (RDW) 17.3 (H) 12.3 - 17.0 %   Platelet Count (PLT) 194 150 - 450 10*3/uL   Mean Platelet Volume (MPV) 8.5 6.8 - 10.2 fL  POC Glucose   Collection Time: 02/18/24  7:46 AM  Result Value Ref Range   Glucose, POC 174 (H) 70 - 99 mg/dL  POC Glucose   Collection Time: 02/18/24 11:36 AM  Result Value Ref Range   Glucose, POC 179 (H) 70 - 99 mg/dL   Imaging:  CTA chest 2/0/7974. IMPRESSION: 1. No acute intrathoracic pathology. No CT evidence of pulmonary embolism. 2. Three-vessel coronary vascular calcification. 3.  Aortic Atherosclerosis (ICD10-I70.0).  Chest x-ray 02/15/2024. IMPRESSION: No active disease.   Assessment/Plan:  1) ESRD with fluid overload.     Much improved s/p emergent HD on 02/15/2024.     Last HD on 02/27/2024.  Postweight of  ? 175 kg.     Fluid restrict to < 1500 mls/day.     Continue p.o. bumetanide  2 mg twice daily.     Monitor BMP, I/O.     Continue TTS HD schedule.     Next HD on 02/19/2024.  2) Malfunctional left arm AV fistula.     Unable to  get fistulogram due to positive blood culture.     IR to place temporary dialysis catheter today.     Will still attempt to use AV fistula on 02/19/2024.   3) HFpEF.     Much better compensated.     Continue p.o. bumetanide .   4) HTN.     Controlled.   5) Type 2 DM.      Monitor fingersticks glucose.      Insulin  coverage.   6) Bilateral LE edema.      Will continue to challenge dry weight on HD.      Continue oral diuretics.   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     Monitor CBC.  8) Gram positive bacteremia.     On IV vancomycin.     Monitor WBC/temps.     Follow-up blood cultures.    LOS: 3 days    Eulas Manes MD, FACP 02/18/2024 3:41 PM

## 2024-02-19 NOTE — Progress Notes (Signed)
   02/19/24 1231  Vital Signs  Temp 98 F (36.7 C)  Temp Source Oral  Heart Rate 71  BP 153/81  BP Location Right arm  BP Method Automatic  Patient Position Lying  SpO2 99 %  O2 Device None (Room air)  Post-Hemodialysis Assessment  End Time 1219  Rinseback Volume (mL) 300 mL  Actual Treatment Time (minutes) 210 minutes  Minutes Short From Prescription 90  EPO Given No  Blood Transfusion Given No  Weight 133 kg (292 lb 15.9 oz)  Patient Position for Weight Lying  Weight Change % -1.34  Hemodialysis Intake (mL) 0 mL  Hemodialysis Output (Volume Removed) mL 2500 mL  Crit-Line Used No  Hemodialysis Handoff Michael-RN  Post-Hemodialysis Comments Tx completed, Unable to meet planned UF goal due to symptomatic hypotension 30 minutes after starting treatment, total of 300 ml NS bolus was given. Nephrologit is aware  Duration of Treatment (minutes) 257 minutes  Patient Assessment  Best Eye Response Spontaneous  Best Verbal Response Oriented  Best Motor Response Follows commands  Glasgow Coma Scale Score 15  Bilateral Breath Sounds Diminished  Heart Sounds S1, S2  Skin Color Appropriate for ethnicity  Skin Condition/Temp Warm;Dry;Swollen  RLE Edema +1  LLE Edema +1  Bowel Sounds (All Quadrants) Present  Hemodialysis Cath Triple Lumen 02/18/24 Non-tunneled catheter Jugular  Placement Date/Time: 02/18/24 1442   Hand Hygiene Completed: Yes  Site Prep: Chloraprep  Site Prep Agent has Completely Dried Before Insertion: Yes  All 5 Sterile Barriers Used (Gloves, Gown, Cap, Mask, Large Sterile Drape): Yes  Local Anesthetic: Inj...  Site Assessment Clean;Dry;Intact  Proximal Lumen Status Blood return noted;Flushed  Medial Lumen Status Blood return noted;Flushed  Distal Lumen Status Blood return noted;Flushed  Dressing Status Intact;Old drainage  Line Necessity Specialty central catheters (CRRT, hemodialysis, plasmapheresis, ECMO)

## 2024-02-20 ENCOUNTER — Other Ambulatory Visit: Payer: Self-pay | Admitting: Family Medicine

## 2024-02-20 DIAGNOSIS — Z794 Long term (current) use of insulin: Secondary | ICD-10-CM

## 2024-02-21 NOTE — Telephone Encounter (Signed)
 Refill request for  Gabapentin   100 mg LR 2/3/5, #90, 3 rf LOV  01/14/24 FOV  03/20/24  Please review and advise  Thanks. Dm/cma

## 2024-02-25 NOTE — Care Plan (Signed)
 At 02/25/24 1:10PM, nursing reported patient expressing not feeling well prior to discharge, endorsed fatigue and nausea. He had received Lantus  20U. Vitals were as recorded: Afebrile, P 80, RR 18, BP 111/75, satting 96% on RA. Patient quickly assessed, upon my assessment, patient was diaphoretic, nauseous and expressed dizziness, mild abdominal pain and blurry vision. POC glucose 473 obtained. Concern for mild DKA with elevated sugars. EKG without concern for abnormalities. BMP with Na 128, AG 15, bicarb 21. VBG with lactic acid and beta-hydroxybutyrate pending. Patient's discharge orders were removed to monitor him.

## 2024-02-25 NOTE — Care Plan (Signed)
 Saw pt on morning rounds. Doing well. No overnight complaints. Eager to be discharged.   Consent for surgery obtained.  Plan for OR on 8/4 for fistulagram and AVF revision.   Electronically signed by: Isaiah Annabella Clause, PA-C 02/25/2024 11:05 AM

## 2024-02-27 NOTE — Progress Notes (Signed)
 Case Management Discharge Note        CSN: [id number] DOB: [birth year] Service: General Medicine Location: 518/01  Patient Class: Inpatient  Discharge transition coordinated by Deliliah Louvet RN Care Manager & Rosina Fitting MSW. Bari Mayotte, [facility] [phone] informed of patient's discharge on 02/26/2024.  DC Disposition: : Home Health Care  Discharge DC Disposition: : Home Health Care Referrals: Hospitalist at Home (OP) to monitor labs Homecare Referral: (PT) Physical Therapy, (OT) Occupational Therapy, (SN) Skilled Nursing Homecare/Hospice Agency(s) chosen: [facility]  Discharge Referrals Chosen geographical local area/county list shared with patient/family: Yes Date chosen geographical local area/county list shared with patient/family: 02/18/24 Post-Acute Provider Form Completed: Yes Case closed, patient/family agree with disposition plan: Yes       Darice Gladis Ebbing, RN, MSN, CCRN-K, Scientist, physiological [phone]

## 2024-02-28 ENCOUNTER — Telehealth: Payer: Self-pay

## 2024-02-28 NOTE — Transitions of Care (Post Inpatient/ED Visit) (Signed)
   02/28/2024  Name: Peter Terry MRN: [id number] DOB: 27-Nov-1955  Today's TOC FU Call Status: Today's TOC FU Call Status:: Unsuccessful Call (1st Attempt) Unsuccessful Call (1st Attempt) Date: 02/28/24  Attempted to reach the patient regarding the most recent Inpatient/ED visit. Left a HIPAA approved voicemail message to phone number provided in demographics per DPR.    Follow Up Plan: Additional outreach attempts will be made to reach the patient to complete the Transitions of Care (Post Inpatient/ED visit) call.   Richerd Fish, RN, BSN, CCM [facility], [facility] RN Care Manager Direct Dial: [phone]

## 2024-02-29 ENCOUNTER — Telehealth: Payer: Self-pay

## 2024-02-29 ENCOUNTER — Ambulatory Visit: Admitting: "Endocrinology

## 2024-02-29 DIAGNOSIS — R5383 Other fatigue: Secondary | ICD-10-CM | POA: Diagnosis not present

## 2024-02-29 DIAGNOSIS — Z5941 Food insecurity: Secondary | ICD-10-CM | POA: Diagnosis not present

## 2024-02-29 DIAGNOSIS — D649 Anemia, unspecified: Secondary | ICD-10-CM | POA: Diagnosis not present

## 2024-02-29 DIAGNOSIS — N189 Chronic kidney disease, unspecified: Secondary | ICD-10-CM | POA: Diagnosis not present

## 2024-02-29 DIAGNOSIS — I700 Atherosclerosis of aorta: Secondary | ICD-10-CM | POA: Diagnosis not present

## 2024-02-29 DIAGNOSIS — N186 End stage renal disease: Secondary | ICD-10-CM | POA: Diagnosis not present

## 2024-02-29 DIAGNOSIS — R0602 Shortness of breath: Secondary | ICD-10-CM | POA: Diagnosis not present

## 2024-02-29 DIAGNOSIS — Z794 Long term (current) use of insulin: Secondary | ICD-10-CM | POA: Diagnosis not present

## 2024-02-29 DIAGNOSIS — R609 Edema, unspecified: Secondary | ICD-10-CM | POA: Diagnosis not present

## 2024-02-29 DIAGNOSIS — Z20822 Contact with and (suspected) exposure to covid-19: Secondary | ICD-10-CM | POA: Diagnosis not present

## 2024-02-29 DIAGNOSIS — I129 Hypertensive chronic kidney disease with stage 1 through stage 4 chronic kidney disease, or unspecified chronic kidney disease: Secondary | ICD-10-CM | POA: Diagnosis not present

## 2024-02-29 DIAGNOSIS — E1122 Type 2 diabetes mellitus with diabetic chronic kidney disease: Secondary | ICD-10-CM | POA: Diagnosis not present

## 2024-02-29 DIAGNOSIS — R531 Weakness: Secondary | ICD-10-CM | POA: Diagnosis not present

## 2024-02-29 DIAGNOSIS — Z992 Dependence on renal dialysis: Secondary | ICD-10-CM | POA: Diagnosis not present

## 2024-02-29 DIAGNOSIS — I120 Hypertensive chronic kidney disease with stage 5 chronic kidney disease or end stage renal disease: Secondary | ICD-10-CM | POA: Diagnosis not present

## 2024-02-29 DIAGNOSIS — D631 Anemia in chronic kidney disease: Secondary | ICD-10-CM | POA: Diagnosis not present

## 2024-02-29 DIAGNOSIS — Z7985 Long-term (current) use of injectable non-insulin antidiabetic drugs: Secondary | ICD-10-CM | POA: Diagnosis not present

## 2024-02-29 DIAGNOSIS — Z87891 Personal history of nicotine dependence: Secondary | ICD-10-CM | POA: Diagnosis not present

## 2024-02-29 DIAGNOSIS — I771 Stricture of artery: Secondary | ICD-10-CM | POA: Diagnosis not present

## 2024-02-29 NOTE — Progress Notes (Signed)
   02/29/2024  Patient ID: Peter Terry, male   DOB: 1955-11-19, 68 y.o.   MRN: [id number]  Outreach attempt to follow-up with patient regarding management of diabetes was unsuccessful, but I was able to leave HIPAA compliant voicemail with my direct phone number.  It appears patient was recently discharged from hospital over the weekend.  He has been approved to receive Tresiba (to replace Lantus ), Novolog  70/30 Mix, Ozempic  0.25/0.5mg , and pen needles through Novo PAP.  Application was approved 6/25, so medications should have arrived at [place]; but I do not see chart notes reflecting this.  Contacting the office to see if medication was delivered; and if so, if this has been picked up.  Peter Terry, PharmD, DPLA

## 2024-02-29 NOTE — Transitions of Care (Post Inpatient/ED Visit) (Signed)
   02/29/2024  Name: Peter Terry MRN: [id number] DOB: 04-21-1956  Today's TOC FU Call Status: Today's TOC FU Call Status:: Unsuccessful Call (2nd Attempt) Unsuccessful Call (2nd Attempt) Date: 02/29/24  Attempted to reach the patient regarding the most recent Inpatient/ED visit. Left a HIPAA approved voicemail message to phone number provided in demographics per DPR.    Follow Up Plan: Additional outreach attempts will be made to reach the patient to complete the Transitions of Care (Post Inpatient/ED visit) call.   Richerd Fish, RN, BSN, CCM [facility], [facility] RN Care Manager Direct Dial: [phone]

## 2024-03-01 ENCOUNTER — Telehealth: Payer: Self-pay

## 2024-03-01 NOTE — Progress Notes (Signed)
     Patient ID: Peter Terry is a 68 y.o. male.   HPI Peter Terry is being seen today by [facility]) for Virtual Visit F/U  Patient Denies Shortness of breath , Chest Pain, Abd Pain, or N/V/D.   Vital Signs:  03/01/2024 Vitals:   03/01/24 1112  BP: 124/74  Pulse: 102  Resp: 18  Temp: 97.5 F (36.4 C)  SpO2: 98%      Lung sounds were assessed and chest clear, no wheezing, rales, normal symmetric air entry.  MEDICATIONS: Was a full MedRec completed: No Medications were reviewed: No All Medications were accounted for:  Visit Summary: arrived to find the patient sitting in the chair this afternoon currently with only complaint of constipation. Patient states last valve movement four days ago. Patient also states has not been urinating very much. Patient denies any chest pain, shortness of breath, nausea, vomiting, fever, or chills. Breath sounds clear throughout with +3 edema to both feet ankles and just below the knee tight edema. video visit connected with the provider. MD order blood work obtained. Patient with no other questions or concerns. [facility] visit complete.  TASK PERFORMED/ MED'S GIVEN: Labs Drawn CBC     OTHER: Interpreter was used today:No Pt is being Monitored by [facility] RPM: No  03/01/2024  11:32 AM Donnice Marcey Pouch, EMT-P

## 2024-03-01 NOTE — Transitions of Care (Post Inpatient/ED Visit) (Addendum)
 03/01/2024  Name: Peter Terry MRN: [id number] DOB: 22-Jul-1956  Today's TOC FU Call Status: Today's TOC FU Call Status:: Successful TOC FU Call Completed TOC FU Call Complete Date: 03/01/24 Patient's Name and Date of Birth confirmed.  Transition Care Management Follow-up Telephone Call Date of Discharge: 02/26/24 Discharge Facility: Other [facility]) Name of Other (Non-[facility]) Discharge Facility: [facility] [place] (Patient confirms Hospitalist at Home program with [facility]) How have you been since you were released from the hospital?: Same Any questions or concerns?: No  Items Reviewed: Did you receive and understand the discharge instructions provided?: Yes Medications obtained,verified, and reconciled?: Yes (Medications Reviewed) Any new allergies since your discharge?: No Dietary orders reviewed?: NA (States, I got too much going on  Difficulty talking) Do you have support at home?: Yes People in Home [RPT]: parent(s)  Patient agrees to review medications at this time but states, I can't talk much today  Medications Reviewed Today: Medications Reviewed Today     Reviewed by Peter Terry GRADE, RN (Registered Nurse) on 03/01/24 at 1337  Med List Status: <None>   Medication Order Taking? Sig Documenting Provider Last Dose Status Informant  albuterol  (VENTOLIN  HFA) 108 (90 Base) MCG/ACT inhaler [id number] Yes Inhale 2 puffs into the lungs every 4 (four) hours as needed for wheezing or shortness of breath. [provider]  Active Self, Pharmacy Records  amLODipine  (NORVASC ) 5 MG tablet [id number] Yes Take 1 tablet (5 mg total) by mouth daily. Peter Garnette HERO, MD  Active   atorvastatin  (LIPITOR) 80 MG tablet [id number] Yes Take 1 tablet (80 mg total) by mouth at bedtime. Peter Garnette HERO, MD  Active   bumetanide  (BUMEX ) 2 MG tablet [id number] Yes Take 2 mg by mouth 2 (two) times daily. [provider]  Active   carvedilol  (COREG ) 25 MG tablet [id number] Yes  Take 1 tablet (25 mg total) by mouth 2 (two) times daily. Peter Garnette HERO, MD  Active   Dulaglutide  (TRULICITY ) 0.75 MG/0.5ML Peter Terry [id number] Yes Inject 0.75 mg into the skin once a week. [provider]  Active   gabapentin  (NEURONTIN ) 100 MG capsule [id number] Yes TAKE 1 CAPSULE (100 MG TOTAL) BY MOUTH 3 (THREE) TIMES DAILY AS NEEDED (NEUROPATHY PAIN). Peter Garnette HERO, MD  Active   hydrALAZINE  (APRESOLINE ) 25 MG tablet [id number] Yes TAKE 3 TABLETS (75 MG TOTAL) BY MOUTH 3 TIMES A DAY Peter Terry, Garnette HERO, MD  Active   insulin  aspart protamine - aspart (NOVOLOG  70/30 MIX) (70-30) 100 UNIT/ML FlexPen [id number] Yes Inject 60 Units into the skin 2 (two) times daily with a meal. Peter Garnette HERO, MD  Active   insulin  glargine (LANTUS ) 100 UNIT/ML Solostar Pen [id number] Yes Inject 60 Units into the skin 2 (two) times daily. Peter Garnette HERO, MD  Active   Insulin  Pen Needle (B-D UF III MINI PEN NEEDLES) 31G X 5 MM MISC [id number] Yes Use to inject insulin  4 times daily Peter Garnette HERO, MD  Active   isosorbide  mononitrate (IMDUR ) 30 MG 24 hr tablet [id number] Yes TAKE 1 TABLET BY MOUTH EVERY DAY Peter Garnette HERO, MD  Active   losartan  (COZAAR ) 100 MG tablet [id number]  Take 100 mg by mouth daily.  Patient not taking: Reported on 03/01/2024   [provider]  Active   metolazone  (ZAROXOLYN ) 5 MG tablet [id number]  Take 5 mg by mouth daily.  Patient not taking: Reported on 03/01/2024   [provider]  Active   omeprazole  (PRILOSEC) 20 MG capsule  [id number] Yes TAKE 1 CAPSULE BY MOUTH EVERY DAY Peter Garnette HERO, MD  Active   traMADol  (ULTRAM ) 50 MG tablet [id number] Yes TAKE 1 TABLET BY MOUTH EVERY 8 HOURS AS NEEDED. Peter Garnette HERO, MD  Active             Patient currently unable and did not want to continue conversation. Sounding very weak at this time.    Follow up appointments reviewed: CCM RN 03/09/24 PCP Follow-up appointment confirmed?:  (Patient currently diclines assistance for  appointment stating have too much going on right now) Specialist Hospital Follow-up appointment confirmed?: NA (States visits from Hospitalist at home program) Do you need transportation to your follow-up appointment?: No Do you understand care options if your condition(s) worsen?: Yes-patient verbalized understanding (Patient states he has the follow up call from hospitalist today)  Terry Fish, RN, BSN, CCM [place]  [facility], [facility] RN Care Manager Direct Dial: [phone]

## 2024-03-03 ENCOUNTER — Telehealth: Payer: Self-pay

## 2024-03-03 ENCOUNTER — Telehealth: Payer: Self-pay | Admitting: Family Medicine

## 2024-03-03 NOTE — Telephone Encounter (Signed)
 Pts meds from pt support have arrived Ozempic  and insulin .

## 2024-03-03 NOTE — Telephone Encounter (Signed)
 We received a package for Mr. Peter Terry that had Novo Needles, Ozempic  and Novolog . I spoke to the pharmacist who ordered this which is [name]. Channing stated there was supposed to be Novolog  and [name]. [name] never came. [name] is aware of this.

## 2024-03-03 NOTE — Telephone Encounter (Signed)
 Telephone encounter created for [employer]. So they are up to date on what is going on with this.

## 2024-03-06 ENCOUNTER — Telehealth

## 2024-03-06 NOTE — Telephone Encounter (Signed)
 Didn't arrive today.  Have the pen needles at des waiting on the other to come in.  Then will call patient. Dm/cma

## 2024-03-07 ENCOUNTER — Telehealth: Payer: Self-pay

## 2024-03-07 NOTE — Progress Notes (Signed)
   03/07/2024  Patient ID: Peter Terry, male   DOB: 1956-01-18, 68 y.o.   MRN: [id number]  Outreach attempt to follow-up in regard to control of diabetes.  I was not able to reach the patient but was able to leave HIPAA compliant voicemail with my direct phone number.  I am also sending a MyChart message to patient.  Of note, most recent A1c obtained during recent hospitalization was 7.6%, down from previous of 9.2%  Patient approved for Novo PAP, and Novolog , pen needles, and Ozempic  have arrived at office; but [name] is delayed.  Making sure patient is aware and that he has enough medication on hand to last until [name] arrives (hopefully next week).   Channing DELENA Mealing, PharmD, DPLA

## 2024-03-09 ENCOUNTER — Telehealth: Payer: Self-pay

## 2024-03-09 DIAGNOSIS — Z992 Dependence on renal dialysis: Secondary | ICD-10-CM | POA: Diagnosis not present

## 2024-03-09 DIAGNOSIS — N186 End stage renal disease: Secondary | ICD-10-CM | POA: Diagnosis not present

## 2024-03-09 NOTE — Patient Instructions (Signed)
 Peter Terry - I am sorry I was unable to reach you today for our scheduled appointment. I work with [place], Garnette HERO, MD and am calling to support your healthcare needs. Please contact me at [phone] at your earliest convenience. I look forward to speaking with you soon.   Thank you,  Heddy Shutter, RN, MSN, BSN, CCM [place]  [facility], Population Health Case Manager Phone: [phone]

## 2024-03-13 ENCOUNTER — Telehealth: Payer: Self-pay

## 2024-03-13 DIAGNOSIS — T82898A Other specified complication of vascular prosthetic devices, implants and grafts, initial encounter: Secondary | ICD-10-CM | POA: Diagnosis not present

## 2024-03-13 DIAGNOSIS — T82858A Stenosis of vascular prosthetic devices, implants and grafts, initial encounter: Secondary | ICD-10-CM | POA: Diagnosis not present

## 2024-03-13 DIAGNOSIS — N186 End stage renal disease: Secondary | ICD-10-CM | POA: Diagnosis not present

## 2024-03-13 DIAGNOSIS — Z992 Dependence on renal dialysis: Secondary | ICD-10-CM | POA: Diagnosis not present

## 2024-03-13 DIAGNOSIS — N189 Chronic kidney disease, unspecified: Secondary | ICD-10-CM | POA: Diagnosis not present

## 2024-03-13 NOTE — Telephone Encounter (Signed)
 Called patient to inform him that Ozempic  pens are in clinic and is ready for pick up. Pt verbalized understanding and a thank you. RX is location in clinic fridge and is labeled until patient pick up; location, contact, and business hours were also provided.

## 2024-03-15 NOTE — Telephone Encounter (Signed)
 Pt came in to pick up his medication that was in the fridge @ 3:45

## 2024-03-20 ENCOUNTER — Encounter: Payer: Self-pay | Admitting: Family Medicine

## 2024-03-20 ENCOUNTER — Ambulatory Visit: Payer: Self-pay | Admitting: Family Medicine

## 2024-03-20 ENCOUNTER — Ambulatory Visit: Admitting: Family Medicine

## 2024-03-20 VITALS — BP 116/70 | HR 75 | Temp 97.8°F | Ht 69.0 in | Wt 297.4 lb

## 2024-03-20 DIAGNOSIS — N186 End stage renal disease: Secondary | ICD-10-CM | POA: Diagnosis not present

## 2024-03-20 DIAGNOSIS — I5022 Chronic systolic (congestive) heart failure: Secondary | ICD-10-CM | POA: Diagnosis not present

## 2024-03-20 DIAGNOSIS — Z794 Long term (current) use of insulin: Secondary | ICD-10-CM | POA: Diagnosis not present

## 2024-03-20 DIAGNOSIS — Z992 Dependence on renal dialysis: Secondary | ICD-10-CM

## 2024-03-20 DIAGNOSIS — I872 Venous insufficiency (chronic) (peripheral): Secondary | ICD-10-CM | POA: Diagnosis not present

## 2024-03-20 DIAGNOSIS — I10 Essential (primary) hypertension: Secondary | ICD-10-CM | POA: Diagnosis not present

## 2024-03-20 DIAGNOSIS — E1122 Type 2 diabetes mellitus with diabetic chronic kidney disease: Secondary | ICD-10-CM | POA: Diagnosis not present

## 2024-03-20 LAB — GLUCOSE, RANDOM: Glucose, Bld: 172 mg/dL — ABNORMAL HIGH (ref 70–99)

## 2024-03-20 LAB — HEMOGLOBIN A1C: Hgb A1c MFr Bld: 9.1 % — ABNORMAL HIGH (ref 4.6–6.5)

## 2024-03-20 NOTE — Progress Notes (Signed)
 [facility] [address] [place] [address] Dept: [phone] Dept Fax: [phone]  Chronic Care Office Visit  Subjective:    Patient ID: Peter Terry, male    DOB: [birth year], 68 y.o..   MRN: [id number]  Chief Complaint  Patient presents with   Follow-up    2  month f/u.  No concerns.     History of Present Illness:  Patient is in today for reassessment of chronic medical issues.  Peter Terry has a history of heart failure with preserved ejection fraction. This has been complicated by acute renal failure on top of ESRD. He also has essential hypertension, COPD, OSA, Type 2 diabetes, morbid obesity, anemia of chronic disease, and chronic stasis dermatitis with cellulitis. His HF is currently managed on carvedilol  25 mg bid and bumetanide  (Bumex ) 3 mg (38m + 1 mg) twice daily with metolazone  5 mg every other day for heart failure management. He has had issues with hypotension during dialysis, so his amlodipine , hydralazine , and isosorbide  mononitrate have been stopped.   Peter Terry has ESRD. He is on dialysis. He ahs had issues with his vascular access, so currently has a subclavian catheter in place. The nephrologist has been able to help him with gradual diuresis. He notes that his leg swelling is considerably less than in the past. He is no longer weeping serous fluid from the legs. He is pleased that this has shown some improvement.   Peter Terry has a Type 2 diabetes diagnosed 20+ years ago. He is currently managed on insulin  glargine (Lantus ) 60 units twice daily and insulin  aspart protamine (Novolog ) 60 units twice daily. He has been on dulaglutide  (Trulicity ) 0.75 mg weekly. He will be transitioning over to semaglutide  (Ozempic ) after he finishes his current prescription of the Trulicity .  Past Medical History: Patient Active Problem List   Diagnosis Date Noted   Impaired mobility and ADLs 02/18/2024   Chronic kidney disease  requiring chronic dialysis (HCC) 01/10/2024   Arteriovenous fistula, acquired (HCC) 11/19/2023   Reactive depression 10/11/2023   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/11/2023   Anemia of chronic disease 09/01/2023   Obesity, class 3 09/01/2023   Chronic right hip pain 07/13/2023   Venous stasis ulcer of left calf limited to breakdown of skin without varicose veins (HCC) 07/13/2023   CHF (congestive heart failure) (HCC)    Dyspnea on exertion 05/02/2023   Obstructive sleep apnea 03/31/2023   GERD (gastroesophageal reflux disease) 03/31/2023   Edema of both lower legs 03/31/2023   Venous stasis dermatitis 03/31/2023   Tinea pedis 03/31/2023   Heart Failure with Preserved EF (HCC) 03/31/2023   Diabetic peripheral neuropathy (HCC) 03/31/2023   Mixed hyperlipidemia 03/30/2023   Essential hypertension 03/30/2023   COPD (chronic obstructive pulmonary disease) (HCC) 03/30/2023   Type 2 diabetes mellitus with diabetic chronic kidney disease (HCC) 11/04/2022   Hypomagnesemia 11/04/2022   Insulin  long-term use (HCC) 08/21/2020   Morbid obesity with body mass index (BMI) of 50.0 to 59.9 in adult [facility]) 07/25/2020   Retinal tear 02/20/2013   Past Surgical History:  Procedure Laterality Date   APPENDECTOMY     LUMBAR DISC SURGERY     L4-L5   Family History  Problem Relation Age of Onset   Cancer Mother    Diabetes Mother    Kidney disease Mother    Heart disease Father    Cancer Father        Colon   Kidney disease  Father    Diabetes Father    Diabetes Brother    Heart disease Paternal Aunt    Outpatient Medications Prior to Visit  Medication Sig Dispense Refill   albuterol  (VENTOLIN  HFA) 108 (90 Base) MCG/ACT inhaler Inhale 2 puffs into the lungs every 4 (four) hours as needed for wheezing or shortness of breath.     atorvastatin  (LIPITOR) 80 MG tablet Take 1 tablet (80 mg total) by mouth at bedtime. 90 tablet 3   bumetanide  (BUMEX ) 2 MG tablet Take 2 mg by mouth  2 (two) times daily.     carvedilol  (COREG ) 25 MG tablet Take 1 tablet (25 mg total) by mouth 2 (two) times daily. 90 tablet 3   gabapentin  (NEURONTIN ) 100 MG capsule TAKE 1 CAPSULE (100 MG TOTAL) BY MOUTH 3 (THREE) TIMES DAILY AS NEEDED (NEUROPATHY PAIN). 90 capsule 3   insulin  aspart protamine - aspart (NOVOLOG  70/30 MIX) (70-30) 100 UNIT/ML FlexPen Inject 60 Units into the skin 2 (two) times daily with a meal. 30 mL 1   insulin  glargine (LANTUS ) 100 UNIT/ML Solostar Pen Inject 60 Units into the skin 2 (two) times daily. 15 mL 11   Insulin  Pen Needle (B-D UF III MINI PEN NEEDLES) 31G X 5 MM MISC Use to inject insulin  4 times daily 400 each 1   metolazone  (ZAROXOLYN ) 5 MG tablet Take 5 mg by mouth daily.     omeprazole  (PRILOSEC) 20 MG capsule TAKE 1 CAPSULE BY MOUTH EVERY DAY 90 capsule 3   traMADol  (ULTRAM ) 50 MG tablet TAKE 1 TABLET BY MOUTH EVERY 8 HOURS AS NEEDED. 30 tablet 2   amLODipine  (NORVASC ) 5 MG tablet Take 1 tablet (5 mg total) by mouth daily. 90 tablet 3   Dulaglutide  (TRULICITY ) 0.75 MG/0.5ML SOAJ Inject 0.75 mg into the skin once a week. (Patient not taking: Reported on 03/07/2024)     hydrALAZINE  (APRESOLINE ) 25 MG tablet TAKE 3 TABLETS (75 MG TOTAL) BY MOUTH 3 TIMES A DAY 810 tablet 1   isosorbide  mononitrate (IMDUR ) 30 MG 24 hr tablet TAKE 1 TABLET BY MOUTH EVERY DAY 90 tablet 3   losartan  (COZAAR ) 100 MG tablet Take 100 mg by mouth daily. (Patient not taking: Reported on 03/20/2024)     No facility-administered medications prior to visit.   No Known Allergies Objective:   Today's Vitals   03/20/24 1343  BP: 116/70  Pulse: 75  Temp: 97.8 F (36.6 C)  TempSrc: Temporal  SpO2: 98%  Weight: 297 lb 6.4 oz (134.9 kg)  Height: 5' 9 (1.753 m)   Body mass index is 43.92 kg/m.   General: Well developed, well nourished. No acute distress. CV: RRR without murmurs or rubs. Pulses 2+ bilaterally. Extremities: 3+ edema of the right lower leg and 4+ with woody edema of the  left lower leg. No seepage   noted. Psych: Alert and oriented. Normal mood and affect.  Health Maintenance Due  Topic Date Due   Medicare Annual Wellness (AWV)  Never done   OPHTHALMOLOGY EXAM  Never done   Hepatitis C Screening  Never done   DTaP/Tdap/Td (1 - Tdap) Never done   Zoster Vaccines- Shingrix (2 of 2) 12/07/2023   INFLUENZA VACCINE  03/10/2024     Assessment & Plan:   Problem List Items Addressed This Visit       Cardiovascular and Mediastinum   Essential hypertension   Blood pressure normal today. Continue carvedilol  25 mg bid.      Heart Failure with Preserved  EF (HCC) - Primary   Compensated. Weight is down 22 lbs from his last visit with me. Continue carvedilol  25 mg bid, bumetanide  (Bumex ) 3 mg twice daily, and metolazone  5 mg every other day.        Endocrine   Type 2 diabetes mellitus with diabetic chronic kidney disease (HCC)   Diabetes remains in poor control. Continue insulin  glargine (Lantus ) 60 units bid and insulin  protamine-aspart (Novolog ) 60 units bid. He will transition to semaglutide  (Ozempic ) after he completes his current stock of dulaglutide  (Trulicity ). Endocrinology appointment pending.      Relevant Orders   Glucose, random (Completed)   Hemoglobin A1c (Completed)     Musculoskeletal and Integument   Venous stasis dermatitis   The lower legs have significant skin changes, but no open sores at present.        Genitourinary   Chronic kidney disease requiring chronic dialysis [facility])   On dialysis and followed by nephrology.       Return in about 3 months (around 06/20/2024) for Reassessment.   Garnette CHRISTELLA Simpler, MD

## 2024-03-20 NOTE — Assessment & Plan Note (Signed)
 On dialysis and followed by nephrology.

## 2024-03-20 NOTE — Assessment & Plan Note (Signed)
 Compensated. Weight is down 22 lbs from his last visit with me. Continue carvedilol  25 mg bid, bumetanide  (Bumex ) 3 mg twice daily, and metolazone  5 mg every other day.

## 2024-03-20 NOTE — Telephone Encounter (Signed)
 Pt picked up   10 boxes of 5x71ml prefilled pen Novolog  and 7 boxes of 3x61ml prefilled pens Tresiba  at the conclusion of OV with Dr. Thedora.

## 2024-03-20 NOTE — Assessment & Plan Note (Signed)
 The lower legs have significant skin changes, but no open sores at present.

## 2024-03-20 NOTE — Assessment & Plan Note (Signed)
 Diabetes remains in poor control. Continue insulin  glargine (Lantus ) 60 units bid and insulin  protamine-aspart (Novolog ) 60 units bid. He will transition to semaglutide  (Ozempic ) after he completes his current stock of dulaglutide  (Trulicity ). Endocrinology appointment pending.

## 2024-03-20 NOTE — Assessment & Plan Note (Signed)
 Blood pressure normal today. Continue carvedilol  25 mg bid.

## 2024-03-21 ENCOUNTER — Telehealth: Payer: Self-pay | Admitting: Pharmacy Technician

## 2024-03-21 NOTE — Progress Notes (Signed)
   03/21/2024  Patient ID: Peter Terry, male   DOB: [birth year], 67 y.o.   MRN: [id number]  Patient engaged with clinical pharmacist for management of diabetes on 01/31/2024. Outreach by [employer] technician was requested.   Outreached patient to discuss diabetes medication management. Left voicemail for patient to return my call at their convenience.   Called [employer] to check on patient's shipments for [name], Novolog  and Ozempic  per PharmD request. Per [employer] system, [name] was delivered on 03/20/2024, Novolog  Mix 70/30 was delievered on 03/03/24 along with Ozempic .  Zoa Dowty, CPhT [facility] Office: [phone] Email: [email or web address]

## 2024-03-22 ENCOUNTER — Telehealth: Payer: Self-pay | Admitting: Pharmacy Technician

## 2024-03-22 NOTE — Progress Notes (Signed)
   03/22/2024 Name: Peter Terry MRN: [id number] DOB: [birth year]  Patient is appearing on a report for [employer] [place] Metric Diabetes and last engaged with the clinical pharmacist to discuss diabetes on 01/31/2024. Contacted patient today to discuss diabetes management and completed medication review.   Diabetes Plan from last clinical pharmacist appointment:  Diabetes: -Currently uncontrolled -Recommend to continue Lantus  60 units daily at breakfast, divide Novolog  70/30 mix to 30 units with breakfast and 30 units with supper -Continue Dexcom for CGM -Will follow-up with Dr. Thedora regarding provider portion of Novo PAP application -Will pass notes from next visit to Dr. Dartha prior to patient's 7/22 visit  Medication Adherence Barriers Identified:  Patient made recommended medication changes per plan: Yes Patient had PCP appointment on 03/20/24. Patient informs he stopped the Lantus  once he picked up Tresiba from the doctor office that arrived due to patient being approved for Novo Nordisk patient assistance. He informs he is using 60 units twice a day. He also informs he is using Ozempic  weekly. Inquired about dose and he informs the one with the pinkish red label. He also informs he is using Novolog  70/30 Mix at 60 units twice a day. Access issues with any new medication or testing device: No He informs he has these medications on hand as he picked them up from the doctor office since he is approved for Novo Nordisk patient assistance foundation. Patient portion of patient assistance completed: Yes He is approved thru 08/09/24 with Novo Nordisk PAP. Patient is checking blood sugars as prescribed: Yes He informs it is running high. He informs is is running in the 200s. Patient's A1c on 03/20/24 was 9.1.  Medication Adherence Barriers Addressed/Actions Taken:  Reviewed medication changes per plan from last clinical pharmacist note Educated patient to contact pharmacy regarding new  prescriptions Patient Assistance Program approved. Patient informs he has picked up all 3 medications. Reviewed instructions for monitoring blood sugars at home and reminded patient to keep a written log to review with pharmacist Reminded patient of date/time of upcoming clinical pharmacist follow up and any upcoming PCP/specialists visits. Patient denies transportation barriers to the appointment. Yes  Next clinical pharmacist appointment is scheduled for: 04/26/2024 at 1:30pm  Kate Caddy, CPhT [facility] Office: [phone] Email: [email or web address]

## 2024-03-23 ENCOUNTER — Ambulatory Visit: Admitting: "Endocrinology

## 2024-03-27 DIAGNOSIS — H353190 Nonexudative age-related macular degeneration, unspecified eye, stage unspecified: Secondary | ICD-10-CM | POA: Diagnosis not present

## 2024-03-27 DIAGNOSIS — E113392 Type 2 diabetes mellitus with moderate nonproliferative diabetic retinopathy without macular edema, left eye: Secondary | ICD-10-CM | POA: Diagnosis not present

## 2024-03-27 DIAGNOSIS — H40053 Ocular hypertension, bilateral: Secondary | ICD-10-CM | POA: Diagnosis not present

## 2024-03-28 DIAGNOSIS — Z992 Dependence on renal dialysis: Secondary | ICD-10-CM | POA: Diagnosis not present

## 2024-03-28 DIAGNOSIS — I770 Arteriovenous fistula, acquired: Secondary | ICD-10-CM | POA: Diagnosis not present

## 2024-03-28 DIAGNOSIS — N186 End stage renal disease: Secondary | ICD-10-CM | POA: Diagnosis not present

## 2024-03-28 DIAGNOSIS — T82858D Stenosis of vascular prosthetic devices, implants and grafts, subsequent encounter: Secondary | ICD-10-CM | POA: Diagnosis not present

## 2024-04-05 ENCOUNTER — Telehealth: Payer: Self-pay

## 2024-04-05 NOTE — Telephone Encounter (Signed)
   Pre-operative Risk Assessment    Patient Name: Peter Terry  DOB: [birth year] MRN: [id number]     Request for Surgical Clearance    Procedure:  Dental Extraction - Amount of Teeth to be Pulled:  14 teeth (5, 2, 8, 9, 10, 11, 12, 19, 22, 24, 25, 27, 28, 29)  Date of Surgery:  Clearance 04/05/24                                 Surgeon: Dr. Prentiss Lemons Surgeon's Group or Practice Name: [facility] Phone number: [phone] Fax number: [phone]   Type of Clearance Requested:   - Medical    Type of Anesthesia:  Local    Additional requests/questions:  Surgeon wants to make sure pt is ok to have procedure done today before starting.   Bonney Willie Daring   04/05/2024, 9:24 AM

## 2024-04-05 NOTE — Telephone Encounter (Signed)
   Patient Name: Peter Terry  DOB: 1956-05-17 MRN: [id number]  Primary Cardiologist: None  Chart reviewed as part of pre-operative protocol coverage.   I made a call-back to the dentist office requesting urgent clearance to pull 14 teeth on a patient who was already prepped in the office. He stated that he sent a letter 1 month ago requesting clearance and was told the patient would need to be seen in the office. Unfortunately I do not see this documentation in the chart.   I reviewed the patient's chart focusing on pertinent cardiac history. The patient has a history of HFpEF, ESRD now on HD, HTN, and insulin  dependent diabetes. I reviewed that medical clearance for this patient would require a visit with us  as we have not seen the patient since March. The patient stated some medications have changed, unclear if this was done through nephrology. He was dialyzed yesterday. I was asked if the patient had stable heart disease. I reviewed the patient has not had a recent ischemic evaluation. I reiterated that I could not give recommendations regarding medical clearance without a visit/televisit with the patient.   Requesting provider asked if there were any absolute contraindications. I reviewed that the patient did not have an indication from a cardiac standpoint that required SBE PPX. I reviewed no recent cardiac procedures, PCI, device implantations, and no anticoagulation/antiplatelet.    Please call with questions.  Jon Garre Wynetta Seith, PA 04/05/2024, 9:51 AM

## 2024-04-06 ENCOUNTER — Telehealth: Payer: Self-pay

## 2024-04-06 NOTE — Telephone Encounter (Signed)
 Patient was identified as falling into the [place] Measure - Diabetes.   Patient was: Appointment already scheduled for:  06/20/24.

## 2024-04-09 DIAGNOSIS — N186 End stage renal disease: Secondary | ICD-10-CM | POA: Diagnosis not present

## 2024-04-09 DIAGNOSIS — Z992 Dependence on renal dialysis: Secondary | ICD-10-CM | POA: Diagnosis not present

## 2024-04-10 DIAGNOSIS — Z992 Dependence on renal dialysis: Secondary | ICD-10-CM | POA: Diagnosis not present

## 2024-04-10 DIAGNOSIS — N186 End stage renal disease: Secondary | ICD-10-CM | POA: Diagnosis not present

## 2024-04-14 HISTORY — PX: AV FISTULA REPAIR: SHX563

## 2024-04-19 ENCOUNTER — Ambulatory Visit

## 2024-04-22 DIAGNOSIS — R112 Nausea with vomiting, unspecified: Secondary | ICD-10-CM | POA: Diagnosis not present

## 2024-04-22 DIAGNOSIS — R42 Dizziness and giddiness: Secondary | ICD-10-CM | POA: Diagnosis not present

## 2024-04-22 DIAGNOSIS — I440 Atrioventricular block, first degree: Secondary | ICD-10-CM | POA: Diagnosis not present

## 2024-04-22 DIAGNOSIS — R110 Nausea: Secondary | ICD-10-CM | POA: Diagnosis not present

## 2024-04-22 DIAGNOSIS — R61 Generalized hyperhidrosis: Secondary | ICD-10-CM | POA: Diagnosis not present

## 2024-04-24 DIAGNOSIS — G8918 Other acute postprocedural pain: Secondary | ICD-10-CM | POA: Diagnosis not present

## 2024-04-24 DIAGNOSIS — N186 End stage renal disease: Secondary | ICD-10-CM | POA: Diagnosis not present

## 2024-04-24 DIAGNOSIS — Z992 Dependence on renal dialysis: Secondary | ICD-10-CM | POA: Diagnosis not present

## 2024-04-26 ENCOUNTER — Other Ambulatory Visit: Payer: Self-pay

## 2024-04-26 NOTE — Progress Notes (Signed)
   04/26/2024  Patient ID: Peter Terry, male   DOB: 16-Feb-1956, 68 y.o.   MRN: [id number]  Subjective/Objective Telephone visit to follow-up on management of diabetes and hypertension   Diabetes: Current medications: Lantus  60 units BID, Novolog  70/30 60 units BID, Ozempic  0.25mg  weekly -Medications tried in the past: metformin caused decreased kidney function -Using Dexcom G7 for CGM and states FBG is averaging 130-180 and post-prandial 220  -Patient does not have consistent meals/snacks and will skip meals at times -Would like to lose weight, which would also improve glycemic control -Patient received Tresiba 200 units/mL, Novolog  70/30, pen needles, and Ozempic  0.25/0.5mg  from Novo PAP -Took 1st dose of Ozempic  0.25mg  weekly today -Still has plenty of Lantus  on hand, so he is not currently using [name] -A1c 9.1% on 8/12   Hypertension: Current medications: bumetanide  2mg  BID, carvedilol  25mg  BID -Metolozone stopped at July hospital discharge, and patient instructed to hold amlodipine , hydralazine , imdur  and losartan  due to hypotension.  Patient experienced another episode of hypotension during dialysis 9/13; this was resolved with IV fluids. -Patient does monitor home BP and endorses readings are averaging 144-154/78-80 -OV reading on 8/11 of 116/70   Assessment/Plan:    Diabetes: -Currently uncontrolled  -Continue current regimen at this time -Patient will switch from Lantus  to [name] once Lantus  on hand is gone -Continue Dexcom for CGM -I recommend increasing Ozempic  to 0.5mg  after completing another 3 weeks of 0.25mg  weekly; depending on home BG readings, could consider decreasing insulin  doses by 10% at that time -Sees Dr. Thedora again in November and will be due for A1c -Scheduled for a follow-up with Dr. Dartha (endo) in December   Hypertension: -Currently moderately controlled, but this is complicated with hypotension occurring with dialysis treatments (patient  receives dialysis Tuesday, Thursday, and Friday) -Continue bumetanide  2mg  BID, carvedilol  25mg  BID at this time -Monitor and record home BP regularly -If patient continues to have home BP readings >130/80, could consider restarting losartan , hydralazine , or amlodipine  at least on non-dialysis days   Follow-up:  4 weeks   Channing DELENA Mealing, PharmD, DPLA

## 2024-04-28 ENCOUNTER — Telehealth

## 2024-04-28 NOTE — Patient Instructions (Signed)
 Aren LULLA Hurst - I am sorry I was unable to reach you today. I work with Thedora, Garnette HERO, MD and am calling to support your healthcare needs. Please contact me at [phone] at your earliest convenience. I look forward to speaking with you soon.   Thank you,   Heddy Shutter, RN, MSN, BSN, CCM [place]  [facility], Population Health Case Manager Phone: [phone]

## 2024-04-29 DIAGNOSIS — R079 Chest pain, unspecified: Secondary | ICD-10-CM | POA: Diagnosis not present

## 2024-04-29 DIAGNOSIS — R112 Nausea with vomiting, unspecified: Secondary | ICD-10-CM | POA: Diagnosis not present

## 2024-04-29 DIAGNOSIS — R100 Acute abdomen: Secondary | ICD-10-CM | POA: Diagnosis not present

## 2024-04-29 DIAGNOSIS — R1084 Generalized abdominal pain: Secondary | ICD-10-CM | POA: Diagnosis not present

## 2024-04-29 DIAGNOSIS — R9431 Abnormal electrocardiogram [ECG] [EKG]: Secondary | ICD-10-CM | POA: Diagnosis not present

## 2024-04-29 NOTE — ED Triage Notes (Signed)
 Pt arrived to ED with c/o upper abdominal pain with nausea x 1 day. Pt reports shortness of breath with normal activity. Pt is dialysis patient with normal dialysis on Thursday.

## 2024-04-29 NOTE — ED Provider Notes (Signed)
 [facility] Emergency Department Provider Note  This document was created using the aid of voice recognition Dragon dictation software.   Provider at Bedside: 04/29/2024 3:27 AM  History   Chief Complaint  Patient presents with  . Abdominal Pain  . Nausea  . Shortness of Breath     History of Present Illness  History obtained from:Patient  Peter Terry is a 68 y.o. male with a complaint of abdominal pain onset earlier today.  Patient reports pain is mainly localized to the periumbilical region.  He describes pain as fairly constant and aching pain.  It does not radiate.  patient reports feeling slightly better now.  Does report nausea and dry heaving.  He also reports that when he turns his head he gets dizzy.  Patient denies history of vertigo but thinks p this is likely the cause of his dizziness.  Patient denies further complaints.  He is on dialysis Tuesday, Thursday, Saturday denies missing any sessions.    Past Medical History Medical History[1]  Past Surgical History Surgical History[2]  Current Medications Home Medications           * albuterol  sulfate (ProAir  RespiClick) 90 mcg/actuation aepb inhaler   * amLODIPine  (NORVASC ) 10 mg tablet ([Paused] since 02/25/2024 12:21 PM)   * atorvastatin  (LIPITOR) 80 mg tablet   * bumetanide  (BUMEX ) 2 mg tablet    Take 1 tablet (2 mg total) by mouth 2 (two) times a day.    Patient taking differently: Take 3 mg by mouth 2 (two) times a day.   * carvediloL  (COREG ) 25 mg tablet   * gabapentin  (NEURONTIN ) 100 mg capsule   * hydrALAZINE  (APRESOLINE ) 25 mg tablet ([Paused] since 02/25/2024 12:21 PM)   * insulin  aspart protamine-insulin  aspart (NovoLOG  70/30) 100 unit/mL (70-30) injection   * isosorbide  mononitrate (IMDUR ) 30 mg 24 hr tablet ([Paused] since 02/25/2024 12:21 PM)   * losartan  (COZAAR ) 100 mg tablet ([Paused] since 02/25/2024 12:21 PM)   * metOLazone  (ZAROXOLYN ) 2.5 mg tablet    Take as directed by  provider    Patient taking differently: Take 2.5 mg by mouth daily. Take as directed by provider   * omeprazole  (PriLOSEC) 20 mg DR capsule   * traMADoL  (ULTRAM ) 50 mg tablet   * traMADoL  (ULTRAM ) 50 mg tablet    Take 1 tablet (50 mg total) by mouth every 6 (six) hours as needed for moderate pain (4-6).   * Trulicity  0.75 mg/0.5 mL subcutaneous pen injector       Allergies Allergies[3]  Family History Family History[4]  Social History Social History[5]   Physical Exam   BP (!) 133/96   Pulse 85   Temp 97.4 F (36.3 C) (Oral)   Resp 20   Ht 175.3 cm (5' 9)   Wt 131 kg (288 lb)   SpO2 99%   BMI 42.53 kg/m   Physical Exam Vitals and nursing note reviewed.  Constitutional:      General: He is not in acute distress.    Appearance: He is well-developed. He is not toxic-appearing or diaphoretic.  HENT:     Head: Normocephalic and atraumatic.     Right Ear: External ear normal.     Left Ear: External ear normal.  Eyes:     General: Lids are normal.        Right eye: No discharge.        Left eye: No discharge.     Extraocular Movements: Extraocular movements intact.  Pupils: Pupils are equal, round, and reactive to light.  Cardiovascular:     Rate and Rhythm: Normal rate and regular rhythm.     Heart sounds: Normal heart sounds.  Pulmonary:     Effort: Pulmonary effort is normal.     Breath sounds: Normal breath sounds. No wheezing, rhonchi or rales.  Abdominal:     Palpations: Abdomen is soft.     Tenderness: There is generalized abdominal tenderness.  Musculoskeletal:     Cervical back: Normal range of motion and neck supple.     Right lower leg: Edema present.     Left lower leg: Edema present.  Skin:    General: Skin is warm.  Neurological:     General: No focal deficit present.     Mental Status: He is alert and oriented to person, place, and time.     Cranial Nerves: Cranial nerves 2-12 are intact.     Sensory: Sensation is intact.     Motor:  Motor function is intact.     Coordination: Coordination is intact.  Psychiatric:        Mood and Affect: Mood normal.        Behavior: Behavior normal.     Labs   Labs Reviewed  COMPREHENSIVE METABOLIC PANEL - Abnormal      Result Value   Sodium 137     Potassium 4.1     Chloride 101     CO2 24     Anion Gap 12     Glucose, Random 88     Blood Urea Nitrogen (BUN) 44 (*)    Creatinine 5.19 (*)    eGFR 12 (*)    Albumin 3.8     Total Protein 8.8     Bilirubin, Total 0.6     Alkaline Phosphatase (ALP) 74     Aspartate Aminotransferase (AST) 10 (*)    Alanine Aminotransferase (ALT) 4 (*)    Calcium  9.3     BUN/Creatinine Ratio 8.5 (*)   TROPONIN, HIGH SENSITIVE X 2 (0 HOUR BASELINE + 2 HOUR) - Abnormal   Troponin, High Sensitive 23 (*)   CBC WITH DIFFERENTIAL - Abnormal   WBC 8.73     RBC 4.66     Hemoglobin 12.7 (*)    Hematocrit 39.2 (*)    Mean Corpuscular Volume (MCV) 84.2     Mean Corpuscular Hemoglobin (MCH) 27.2 (*)    Mean Corpuscular Hemoglobin Conc (MCHC) 32.3 (*)    Red Cell Distribution Width (RDW) 18.4 (*)    Platelet Count (PLT) 148 (*)    Mean Platelet Volume (MPV) 8.2     Neutrophils % 65     Lymphocytes % 20     Monocytes % 11     Eosinophils % 3     Basophils % 1     Neutrophils Absolute 5.60     Lymphocytes # 1.80     Monocytes # 1.00 (*)    Eosinophils # 0.20     Basophils # 0.10    URINALYSIS WITH REFLEX TO MICROSCOPIC - Abnormal   Color, Urine Yellow     Clarity, Urine Clear     Specific Gravity, Urine 1.011     pH, Urine 6.0     Protein, Urine 70 (*)    Glucose, Urine Negative     Ketones, Urine Negative     Bilirubin, Urine Negative     Blood, Urine Negative     Nitrite, Urine Negative  Leukocyte Esterase, Urine Negative     Urobilinogen, Urine Normal     WBC, Urine 6-12 (*)    RBC, Urine 3-5 (*)    Bacteria, Urine None Seen     Squamous Epithelial Cells, Urine 0-5    TROPONIN, HIGH SENSITIVE, 2HR RFX - Abnormal    Troponin, High Sensitive 25 (*)   B-TYPE NATRIURETIC PEPTIDE (BNP) - Normal   B-Type Natriuretic Peptide (BNP) 32    URINE CULTURE     EKG   EKG Impression:  (Interpreted by me)  EKG Interpretation : normal sinus rhythm, no STEMI, T wave inversion no longer evident in inferior leads Hoy Space, PA-C 04/29/2024 4:59 AM   Radiology   Imaging: (Interpreted by me)  Radiology Results (last 72 hours)     Procedure Component Value Units Date/Time   CT Abdomen Pelvis W Contrast - Preliminary [[id number]] Collected: 04/29/24 0415   Order Status: Completed Updated: 04/29/24 0423   This result has not been signed. Information might be incomplete.     Narrative:     CT ABDOMEN PELVIS W CONTRAST, 04/29/2024 4:05 AM  INDICATION: Abdominal pain, acute, nonlocalized, Nausea/vomiting \  ADDITIONAL HISTORY: None. COMPARISON: Renal ultrasound 05/05/2023.  TECHNIQUE: CT images of the abdomen and pelvis were obtained after intravenous administration of iodinated contrast. Conventional axial reconstructions and multiplanar reformatted images were submitted for review.   FINDINGS:  . Lower Chest: Within normal limits.  . Liver: No suspicious focal findings. Enlarged left hepatic lobe with mild undulation of the surface contour. . Gallbladder/Biliary: Unremarkable. SABRA Spleen: Unremarkable. . Pancreas: Unremarkable. . Adrenals: Unremarkable. . Kidneys: Multilocular left inferior pole complex cyst with thin internal septation (Bosniak 2). No hydronephrosis or nephrolithiasis. Mild asymmetric left perinephric stranding and fluid, particularly along the inferior pole.  SABRA Peritoneum/Mesenteries/Extraperitoneum: No free air. No free fluid or loculated drainable collection. Enlarged bilateral inguinal lymph nodes are likely reactive. . Gastrointestinal tract: No evidence of obstruction. Mild colonic diverticulosis.  . Ureters: Unremarkable. . Bladder: Unremarkable. . Reproductive System:  Unremarkable.  . Vascular: Moderate calcific atherosclerosis of the abdominal aorta and branch vessels. Ectasia of the bilateral common iliac arteries. . Musculoskeletal: L4-S1 posterior decompression and instrumented fusion. No acute displaced fractures. Polyarticular degenerative changes. No aggressive focal bony lesions. Abdominal wall soft tissues unremarkable.    Impression:     1.  Mild asymmetric left perinephric stranding and ill-defined fluid, nonspecific. Correlate with urinalysis if there is concern for a urinary tract infection. 2.  Findings in the liver which could reflect early cirrhotic changes. Consider gastroenterology referral.   XR Chest 1 View - Preliminary [[id number]] Collected: 04/29/24 0253   Order Status: Completed Updated: 04/29/24 0253   This result has not been signed. Information might be incomplete.     Narrative:     XR CHEST 1 VIEW, 04/29/2024 2:29 AM  INDICATION:Chest Pain  COMPARISON: 02/29/2024.  FINDINGS:   Supportive devices: Right IJ approach dialysis catheter terminates at the superior cavoatrial junction. Cardiovascular/lungs/pleura: Cardiac silhouette and pulmonary vasculature are within normal limits. Lungs are clear. No pleural effusion or pneumothorax.  Other: Unremarkable.    Impression:     There is no evidence of acute cardiac or pulmonary abnormality.            Medical Decision Making   Additional Information: I reviewed the patient's past medical history, including notes from our facility and what is available in CareEverywhere.  External records were reviewed: I have reviewed vascular surgery note from 04/24/24  Differentials  considered: DDx: appendicitis, bowel obstruction, AAA, UTI, pyelonephritis, nephrolithiasis, pancreatitis, cholecystitis, shingles, perforated bowel or ulcer, diverticulitis, mesenteric ischemia, inflammatory bowel disease, or strangulated/incarcerated hernia.    Patient hooked up to cardiac telemetry for  close monitoring.    Clinical Complexity:  Patient's presentation is most consistent with acute presentation with potential threat to life or bodily function.   Medical Decision Making Patient is a 68 year old male who presented to the ED for evaluation of abdominal pain, nausea, vomiting, and dizziness.  Patient reports dizziness only occurs when he turns his head.  He thinks it may be vertigo.  Patient reports abdominal pain has improved.  On exam, patient is afebrile, nontoxic-appearing, stable vital signs.  He has generalized abdominal tenderness to palpation.  His neurological exam is unremarkable.  No concern for CVA or intracranial hemorrhage at this time. He was given meclizine and zofran  and reports dizziness and nausea have resolved. Labs are at baseline given his history of ESRD on dialysis. CT scan shows possible left pyelonephritis. Will place patient on ceftin as UA also has a few WBCs. Urine culture pending. Patient will also be discharged with prescription for zofran . He was advised to make sure he attends dialysis session this morning. Patient ready for d/c. Patient discharged home in stable condition. Parent/patient agrees with treatment plan and disposition. All questions answered. Strict ED return precautions dicussed with parent/patient. Reviewed discharge instructions in detail. Parent/patient verbalizes understanding and agrees with the plan and has no further questions or concerns.    Problems Addressed: Generalized abdominal pain: complicated acute illness or injury Nausea: complicated acute illness or injury  Amount and/or Complexity of Data Reviewed Labs: ordered. Radiology: ordered and independent interpretation performed. ECG/medicine tests: ordered and independent interpretation performed.  Risk Prescription drug management.     Diagnosis:  1. Generalized abdominal pain   2. Nausea      Medication List     START taking these medications     cefUROXime 500 mg tablet Commonly known as: CEFTIN Take 1 tablet (500 mg total) by mouth 2 (two) times a day for 10 days.   ondansetron  4 mg disintegrating tablet Commonly known as: ZOFRAN -ODT Dissolve 1 tablet (4 mg total) on tongue every 6 (six) hours as needed for nausea or vomiting.       ASK your doctor about these medications    amLODIPine  10 mg tablet Wait to take this until your doctor or other care provider tells you to start again. Commonly known as: NORVASC  Take 10 mg by mouth every morning.   atorvastatin  80 mg tablet Commonly known as: LIPITOR Take 80 mg by mouth at bedtime.   bumetanide  2 mg tablet Commonly known as: BUMEX  Take 1 tablet (2 mg total) by mouth 2 (two) times a day.   carvediloL  25 mg tablet Commonly known as: COREG  Take 25 mg by mouth 2 (two) times a day.   gabapentin  100 mg capsule Commonly known as: NEURONTIN  Take 100 mg by mouth 3 (three) times a day.   hydrALAZINE  25 mg tablet Wait to take this until your doctor or other care provider tells you to start again. Commonly known as: APRESOLINE  Take 25 mg by mouth 3 (three) times a day.   insulin  aspart protamine-insulin  aspart 100 unit/mL (70-30) injection Commonly known as: NovoLOG  70/30 Inject 60 Units under the skin in the morning and 60 Units in the evening. Inject before meals.   isosorbide  mononitrate 30 mg 24 hr tablet Wait to take this until your doctor  or other care provider tells you to start again. Commonly known as: IMDUR  Take 1 tablet by mouth daily.   losartan  100 mg tablet Wait to take this until your doctor or other care provider tells you to start again. Commonly known as: COZAAR  Take 100 mg by mouth daily.   metOLazone  2.5 mg tablet Commonly known as: ZAROXOLYN  Take as directed by provider   omeprazole  20 mg DR capsule Commonly known as: PriLOSEC Take 20 mg by mouth daily.   ProAir  RespiClick 90 mcg/actuation Aepb inhaler Generic drug: albuterol   sulfate Inhale 2 puffs every 6 (six) hours as needed (wheezing).   * traMADoL  50 mg tablet Commonly known as: ULTRAM  Take 50 mg by mouth as needed.   * traMADoL  50 mg tablet Commonly known as: ULTRAM  Take 1 tablet (50 mg total) by mouth every 6 (six) hours as needed for moderate pain (4-6).   Trulicity  0.75 mg/0.5 mL subcutaneous pen injector Generic drug: dulaglutide  Inject 0.75 mg under the skin every 7 days.      * * This list has 2 medication(s) that are the same as other medications prescribed for you. Read the directions carefully, and ask your doctor or other care provider to review them with you.            Where to Get Your Medications     These medications were sent to [id number] - [place], [place] - [address] - PHONE: [phone] - FAX: [phone]  [address], [place] [address]    Phone: [phone]  cefUROXime 500 mg tablet ondansetron  4 mg disintegrating tablet    FOLLOW UP Garnette Buren Simpler, MD [address] [place] [address] [phone]  Schedule an appointment as soon as possible for a visit    [facility] -  EMERGENCY DEPARTMENT [address] [place]  [address] [phone] Go to  As needed   (Please note that portions of this note have been completed with Dragon voice recognition software.  Efforts were made to correct any errors, but occasionally words are mis-transcribed.)       [1] Past Medical History: Diagnosis Date  . CHF (congestive heart failure)    (CMD)   . Chronic kidney disease   . COPD (chronic obstructive pulmonary disease)    (CMD)   . Diabetes mellitus    (CMD)   . End stage renal disease on dialysis    (CMD)   . Hypertension   . Retinal tear of left eye   . Sleep apnea    haven't used cpap in years  [2] Past Surgical History: Procedure Laterality Date  . APPENDECTOMY      Procedure: APPENDECTOMY  . AV FISTULA PLACEMENT Left 10/25/2023   CREATION FISTULA ARTERIAL VENOUS performed by Franky JENEANE Chihuahua, MD at [facility] OR  . AV FISTULA REPAIR Left 03/13/2024   REVISION AV FISTULA performed by Franky JENEANE Chihuahua, MD at [facility] OR  . AV FISTULA REPAIR Left 04/24/2024   REVISION AV FISTULA performed by Franky JENEANE Chihuahua, MD at [facility] OR  . BACK SURGERY     Procedure: BACK SURGERY fusion L4-L5  . INVASIVE VASCULAR PROCEDURE Left 03/13/2024   Fistulagram arterial venous performed by Franky JENEANE Chihuahua, MD at [facility] OR  . LASER TREATMENT Left 02-20-13   Procedure: LASER TREATMENT; Left eye, for Retinal tear, retinopexy by Dr. Ubaldo.   [3] No Known  Allergies [4] Family History Problem Relation Name Age of Onset  . Blindness Mother    . Cataracts Mother    . Diabetes Mother    . Kidney disease Mother    . Retinal degeneration Mother    . No Known Problems Sister    [5] Social History Tobacco Use  . Smoking status: Former    Passive exposure: Past  . Smokeless tobacco: Never  Vaping Use  . Vaping status: Never Used  Substance Use Topics  . Alcohol use: Not Currently  . Drug use: Never  *Some images could not be shown.

## 2024-05-01 ENCOUNTER — Other Ambulatory Visit: Payer: Self-pay

## 2024-05-01 ENCOUNTER — Ambulatory Visit

## 2024-05-01 VITALS — BP 144/82 | HR 80 | Ht 69.6 in | Wt 302.0 lb

## 2024-05-01 DIAGNOSIS — Z01810 Encounter for preprocedural cardiovascular examination: Secondary | ICD-10-CM | POA: Diagnosis not present

## 2024-05-01 DIAGNOSIS — I5022 Chronic systolic (congestive) heart failure: Secondary | ICD-10-CM | POA: Diagnosis not present

## 2024-05-01 DIAGNOSIS — E1122 Type 2 diabetes mellitus with diabetic chronic kidney disease: Secondary | ICD-10-CM | POA: Diagnosis not present

## 2024-05-01 DIAGNOSIS — I10 Essential (primary) hypertension: Secondary | ICD-10-CM | POA: Diagnosis not present

## 2024-05-01 NOTE — Assessment & Plan Note (Addendum)
 From cardiac standpoint okay to proceed with his dental extraction as being planned. From cardiac standpoint no indication for prophylactic antibiotics.  Document brought by him for the visit requesting this assessment was completed and signed.

## 2024-05-01 NOTE — Progress Notes (Signed)
 Cardiology Consultation:    Date:  05/01/2024   ID:  Peter Terry, DOB 01-17-1956, MRN [id number]  PCP:  Thedora Garnette HERO, MD  Cardiologist:  Alean SAUNDERS Alphonse Asbridge, MD   Referring MD: Thedora Garnette HERO, MD   No chief complaint on file.    ASSESSMENT AND PLAN:   Mr Caudill 68 year old male patient with chronic diastolic heart failure with preserved ejection fraction, morbid obesity, hypertension, diabetes mellitus, hyperlipidemia, obstructive sleep apnea-unable to tolerate CPAP, chronic venous insufficiency with bilateral lower extremity edema, ESRD-now on hemodialysis continues to make urine and has been maintained on Bumex  and total zone.  Here for follow-up visit.  Problem List Items Addressed This Visit     Essential hypertension   Suboptimal blood pressures. Continue carvedilol  25 mg twice daily. Dose of carvedilol  can be titrated up if blood pressures consistently above 130/80 mmHg.       Heart Failure with Preserved EF (HCC) - Primary   Appears compensated. Fluid status being managed via hemodialysis but remains on Bumex  and metolazone  oral dosing as he continues to diurese.  Advised to continue with salt restriction below 2 g/day. Will defer titration of diuretic doses to nephrologist.  Continue carvedilol  25 mg twice daily.        Preop cardiovascular exam   From cardiac standpoint okay to proceed with his dental extraction as being planned. From cardiac standpoint no indication for prophylactic antibiotics.  Document brought by him for the visit requesting this assessment was completed and signed.       Return to clinic for follow-up tentatively in 6 months.  History of Present Illness:    Peter Terry is a 68 y.o. male who is being seen today for follow-up visit. PCP is Rudd, Garnette HERO, MD. Last visit with me in the office was 11/03/2023.  history of chronic diastolic heart failure with preserved ejection fraction, morbid obesity, hypertension,  diabetes mellitus, hyperlipidemia, obstructive sleep apnea-unable to tolerate CPAP, chronic venous insufficiency with bilateral lower extremity edema.  With CKD stage IV, extremely difficult fluid management due to dietary nonadherence, underlying renal function and response to diuretics subsequently started on hemodialysis undergoing through right IJ tunneled hemodialysis catheter.  Also underwent left upper arm AV fistula placement.  Has had couple visits to the ER in the last 10 days initially on 04/22/2024 for nausea and dizziness during dialysis and more recently on the week of 04/29/2024 for abdominal discomfort periumbilical region felt to be in the setting of possible left pyelonephritis and started on antibiotics.  High-sensitivity troponin was flat 23, 25.  BNP was 32.  Overall mentions he is doing well with his breathing.  Functional status seems improved.  Ambulation remains limited due to musculoskeletal issues.  Uses a cane to ambulate. Bilateral lower extremity edema is improved. His weight has decreased down to 302 pounds in comparison to 330 pounds at last office visit with me in March 2025.  Still continues to make urine and is maintained on Bumex  2 mg twice daily and metolazone  5 mg once daily.  He is anticipating teeth extraction, up to 14 teeth are being planned to be pulled and is requesting preprocedural cardiovascular clearance and brought a form to the visit today.  Continues to report compliance with his medications. Has modified his diet and has been mindful about salt restriction.   Past Medical History:  Diagnosis Date   Acute heart failure with preserved ejection fraction (HFpEF) (HCC) 05/02/2023   Acute on chronic diastolic CHF (  congestive heart failure) (HCC) 08/25/2023   Acute renal failure superimposed on stage 4 chronic kidney disease (HCC) 08/26/2023   Anemia of chronic disease 09/01/2023   Cellulitis of right lower extremity 09/01/2023   CHF (congestive heart  failure) (HCC)    Chronic kidney disease, stage 4 (severe) (HCC) 03/31/2023   Chronic right hip pain 07/13/2023   COPD (chronic obstructive pulmonary disease) (HCC) 03/30/2023   Diabetic peripheral neuropathy (HCC) 03/31/2023   Dyspnea on exertion 05/02/2023   Edema of both lower legs 03/31/2023   Essential hypertension 03/30/2023   GERD (gastroesophageal reflux disease) 03/31/2023   Heart Failure with Preserved EF (HCC) 03/31/2023   Hypomagnesemia 11/04/2022   Insulin  long-term use (HCC) 08/21/2020   Iron  deficiency anemia 03/31/2022   Mixed hyperlipidemia 03/30/2023   Morbid obesity with BMI of 45.0-49.9, adult (HCC) 07/25/2020   Morbid obesity with body mass index (BMI) of 50.0 to 59.9 in adult (HCC) 07/25/2020   Multiple open wounds of lower extremity 05/02/2023   Obesity, class 3 09/01/2023   Obstructive sleep apnea 03/31/2023   Reactive depression 10/11/2023   Retinal tear 02/20/2013   Secondary hyperparathyroidism of renal origin 10/11/2023   Tinea pedis 03/31/2023   Type 2 diabetes mellitus with diabetic neuropathy, unspecified (HCC) 11/04/2022   Type 2 diabetes mellitus with hyperglycemia, with long-term current use of insulin  (HCC) 05/02/2023   Type 2 diabetes mellitus with stage 4 chronic kidney disease, with long-term current use of insulin  (HCC) 11/04/2022   Venous stasis dermatitis 03/31/2023   Venous stasis ulcer of left calf limited to breakdown of skin without varicose veins (HCC) 07/13/2023    Past Surgical History:  Procedure Laterality Date   APPENDECTOMY     LUMBAR DISC SURGERY     L4-L5    Current Medications: Current Meds  Medication Sig   albuterol  (VENTOLIN  HFA) 108 (90 Base) MCG/ACT inhaler Inhale 2 puffs into the lungs every 4 (four) hours as needed for wheezing or shortness of breath.   atorvastatin  (LIPITOR) 80 MG tablet Take 1 tablet (80 mg total) by mouth at bedtime.   bumetanide  (BUMEX ) 2 MG tablet Take 2 mg by mouth 2 (two) times daily.    carvedilol  (COREG ) 25 MG tablet Take 1 tablet (25 mg total) by mouth 2 (two) times daily.   Continuous Glucose Sensor (DEXCOM G7 15 DAY SENSOR) MISC by Does not apply route. Change sensor every 15 days   gabapentin  (NEURONTIN ) 100 MG capsule TAKE 1 CAPSULE (100 MG TOTAL) BY MOUTH 3 (THREE) TIMES DAILY AS NEEDED (NEUROPATHY PAIN).   insulin  aspart protamine - aspart (NOVOLOG  70/30 MIX) (70-30) 100 UNIT/ML FlexPen Inject 60 Units into the skin 2 (two) times daily with a meal.   insulin  degludec (TRESIBA FLEXTOUCH) 200 UNIT/ML FlexTouch Pen Inject 60 Units into the skin.   insulin  glargine (LANTUS ) 100 UNIT/ML Solostar Pen Inject 60 Units into the skin 2 (two) times daily.   Insulin  Pen Needle (B-D UF III MINI PEN NEEDLES) 31G X 5 MM MISC Use to inject insulin  4 times daily   metolazone  (ZAROXOLYN ) 5 MG tablet Take 5 mg by mouth daily.   omeprazole  (PRILOSEC) 20 MG capsule TAKE 1 CAPSULE BY MOUTH EVERY DAY   traMADol  (ULTRAM ) 50 MG tablet TAKE 1 TABLET BY MOUTH EVERY 8 HOURS AS NEEDED.     Allergies:   Patient has no known allergies.   Social History   Socioeconomic History   Marital status: Single    Spouse name: Not on file  Number of children: 1   Years of education: Not on file   Highest education level: GED or equivalent  Occupational History   Occupation: Air cabin crew Delivery  Tobacco Use   Smoking status: Former    Types: Cigarettes   Smokeless tobacco: Not on file  Vaping Use   Vaping status: Never Used  Substance and Sexual Activity   Alcohol use: No   Drug use: No   Sexual activity: Not Currently  Other Topics Concern   Not on file  Social History Narrative   Not on file   Social Drivers of Health   Financial Resource Strain: Low Risk  (03/17/2024)   Overall Financial Resource Strain (CARDIA)    Difficulty of Paying Living Expenses: Not very hard  Food Insecurity: Food Insecurity Present (03/17/2024)   Hunger Vital Sign    Worried About Running Out of Food in the Last  Year: Sometimes true    Ran Out of Food in the Last Year: Sometimes true  Transportation Needs: Unknown (03/17/2024)   PRAPARE - Transportation    Lack of Transportation (Medical): No    Lack of Transportation (Non-Medical): Patient declined  Physical Activity: Insufficiently Active (03/17/2024)   Exercise Vital Sign    Days of Exercise per Week: 2 days    Minutes of Exercise per Session: 30 min  Stress: No Stress Concern Present (03/17/2024)   [employer] of Occupational Health - Occupational Stress Questionnaire    Feeling of Stress: Only a little  Social Connections: Socially Isolated (03/17/2024)   Social Connection and Isolation Panel    Frequency of Communication with Friends and Family: Once a week    Frequency of Social Gatherings with Friends and Family: Never    Attends Religious Services: Never    Diplomatic Services operational officer: No    Attends Engineer, structural: Not on file    Marital Status: Never married     Family History: The patient's family history includes Cancer in his father and mother; Diabetes in his brother, father, and mother; Heart disease in his father and paternal aunt; Kidney disease in his father and mother. ROS:   Please see the history of present illness.    All 14 point review of systems negative except as described per history of present illness.  EKGs/Labs/Other Studies Reviewed:    The following studies were reviewed today:   EKG:       Recent Labs: 08/25/2023: B Natriuretic Peptide 106.6 08/26/2023: Magnesium 1.8 08/28/2023: ALT 17 09/13/2023: BUN 90; Creatinine, Ser 4.79; Hemoglobin 9.6; Platelets 234.0; Potassium 3.1; Sodium 139  Recent Lipid Panel    Component Value Date/Time   CHOL 165 03/31/2023 1118   TRIG 220.0 (H) 03/31/2023 1118   HDL 30.50 (L) 03/31/2023 1118   CHOLHDL 5 03/31/2023 1118   VLDL 44.0 (H) 03/31/2023 1118   LDLDIRECT 106.0 03/31/2023 1118    Physical Exam:    VS:  BP (!) 144/82   Pulse 80    Ht 5' 9.6 (1.768 m)   Wt (!) 302 lb 0.6 oz (137 kg)   SpO2 93%   BMI 43.84 kg/m     Wt Readings from Last 3 Encounters:  05/01/24 (!) 302 lb 0.6 oz (137 kg)  03/20/24 297 lb 6.4 oz (134.9 kg)  01/10/24 (!) 319 lb 9.6 oz (145 kg)     GENERAL:  Well nourished, well developed in no acute distress NECK: No JVD; No carotid bruits CARDIAC: RRR, S1 and S2 present, no  murmurs, no rubs, no gallops CHEST:  Clear to auscultation without rales, wheezing or rhonchi. Right side sternal hemodialysis catheter. Extremities: Nonpitting bilateral lower extremity edema appears secondary to lipidema.  pulses bilaterally symmetric with radial 2+ and dorsalis pedis 2+ Left upper arm scars from recent AV fistula placement, palpable thrill present. NEUROLOGIC:  Alert and oriented x 3  Medication Adjustments/Labs and Tests Ordered: Current medicines are reviewed at length with the patient today.  Concerns regarding medicines are outlined above.  No orders of the defined types were placed in this encounter.  No orders of the defined types were placed in this encounter.   Signed, Alean jess Kobus, MD, MPH, [facility]. 05/01/2024 4:56 PM    [facility]

## 2024-05-01 NOTE — Assessment & Plan Note (Addendum)
 Appears compensated. Fluid status being managed via hemodialysis but remains on Bumex  and metolazone  oral dosing as he continues to diurese.  Advised to continue with salt restriction below 2 g/day. Will defer titration of diuretic doses to nephrologist.  Continue carvedilol  25 mg twice daily.

## 2024-05-01 NOTE — Assessment & Plan Note (Signed)
 Suboptimal blood pressures. Continue carvedilol  25 mg twice daily. Dose of carvedilol  can be titrated up if blood pressures consistently above 130/80 mmHg.

## 2024-05-01 NOTE — Patient Instructions (Signed)
 Medication Instructions:  Your physician recommends that you continue on your current medications as directed. Please refer to the Current Medication list given to you today.  *If you need a refill on your cardiac medications before your next appointment, please call your pharmacy*  Lab Work: NONE If you have labs (blood work) drawn today and your tests are completely normal, you will receive your results only by: MyChart Message (if you have MyChart) OR A paper copy in the mail If you have any lab test that is abnormal or we need to change your treatment, we will call you to review the results.  Testing/Procedures: NONE  Follow-Up: At [facility], you and your health needs are our priority.  As part of our continuing mission to provide you with exceptional heart care, our providers are all part of one team.  This team includes your primary Cardiologist (physician) and Advanced Practice Providers or APPs (Physician Assistants and Nurse Practitioners) who all work together to provide you with the care you need, when you need it.  Your next appointment:   6 month(s)  Provider:   Bertha Broad, MD   We recommend signing up for the patient portal called "MyChart".  Sign up information is provided on this After Visit Summary.  MyChart is used to connect with patients for Virtual Visits (Telemedicine).  Patients are able to view lab/test results, encounter notes, upcoming appointments, etc.  Non-urgent messages can be sent to your provider as well.   To learn more about what you can do with MyChart, go to [email or web address].   Other Instructions

## 2024-05-08 ENCOUNTER — Other Ambulatory Visit: Payer: Self-pay

## 2024-05-08 ENCOUNTER — Other Ambulatory Visit: Payer: Self-pay | Admitting: Family Medicine

## 2024-05-08 DIAGNOSIS — G8929 Other chronic pain: Secondary | ICD-10-CM

## 2024-05-08 NOTE — Patient Instructions (Signed)
 Visit Information  Thank you for taking time to visit with me today. Please don't hesitate to contact me if I can be of assistance to you before our next scheduled appointment.  Your next care management appointment is by telephone on 05/29/24 at 1:30 pm  Please call the care guide team at [phone] if you need to cancel, schedule, or reschedule an appointment.   Please call the Suicide and Crisis Lifeline: 988 call the [place]  National Suicide Prevention Lifeline: (406) 079-6958 or TTY: 440-329-9834 TTY 782-606-1417) to talk to a trained counselor call 1-800-273-TALK (toll free, 24 hour hotline) if you are experiencing a Mental Health or Behavioral Health Crisis or need someone to talk to.  Heddy Shutter, RN, MSN, BSN, CCM [place]  [facility], Population Health Case Manager Phone: [phone]

## 2024-05-08 NOTE — Patient Outreach (Signed)
 Complex Care Management   Visit Note  05/08/2024  Name:  Peter Terry MRN: [id number] DOB: 1955-12-31  Situation: Referral received for Complex Care Management related to CHF, ESRD, DMII I obtained verbal consent from Patient.  Visit completed with Patient  on the phone  Background:   Past Medical History:  Diagnosis Date   Acute heart failure with preserved ejection fraction (HFpEF) (HCC) 05/02/2023   Acute on chronic diastolic CHF (congestive heart failure) (HCC) 08/25/2023   Acute renal failure superimposed on stage 4 chronic kidney disease (HCC) 08/26/2023   Anemia of chronic disease 09/01/2023   Cellulitis of right lower extremity 09/01/2023   CHF (congestive heart failure) (HCC)    Chronic kidney disease, stage 4 (severe) (HCC) 03/31/2023   Chronic right hip pain 07/13/2023   COPD (chronic obstructive pulmonary disease) (HCC) 03/30/2023   Diabetic peripheral neuropathy (HCC) 03/31/2023   Dyspnea on exertion 05/02/2023   Edema of both lower legs 03/31/2023   Essential hypertension 03/30/2023   GERD (gastroesophageal reflux disease) 03/31/2023   Heart Failure with Preserved EF (HCC) 03/31/2023   Hypomagnesemia 11/04/2022   Insulin  long-term use (HCC) 08/21/2020   Iron  deficiency anemia 03/31/2022   Mixed hyperlipidemia 03/30/2023   Morbid obesity with BMI of 45.0-49.9, adult (HCC) 07/25/2020   Morbid obesity with body mass index (BMI) of 50.0 to 59.9 in adult (HCC) 07/25/2020   Multiple open wounds of lower extremity 05/02/2023   Obesity, class 3 09/01/2023   Obstructive sleep apnea 03/31/2023   Reactive depression 10/11/2023   Retinal tear 02/20/2013   Secondary hyperparathyroidism of renal origin 10/11/2023   Tinea pedis 03/31/2023   Type 2 diabetes mellitus with diabetic neuropathy, unspecified (HCC) 11/04/2022   Type 2 diabetes mellitus with hyperglycemia, with long-term current use of insulin  (HCC) 05/02/2023   Type 2 diabetes mellitus with stage 4 chronic kidney  disease, with long-term current use of insulin  (HCC) 11/04/2022   Venous stasis dermatitis 03/31/2023   Venous stasis ulcer of left calf limited to breakdown of skin without varicose veins (HCC) 07/13/2023    Assessment: patient reports he does not have a lot of time as he is getting ready to go to the dentist to get some dental work- teeth removed. He denies any questions or concerns at this time. States his only concern right now is related to his dental needs.  Patient Reported Symptoms:  Cognitive Cognitive Status: No symptoms reported, Alert and oriented to person, place, and time      Neurological Neurological Review of Symptoms: No symptoms reported    HEENT HEENT Symptoms Reported: No symptoms reported      Cardiovascular Cardiovascular Symptoms Reported: No symptoms reported Is patient checking Blood Pressure at home?: Yes Patient's Recent BP reading at home: Patient reports he did not check his BP this morning. Patient completed cardiology visit on 05/01/24. per visit note provider: fluid status managed by HD; deferring diuretic dosing to nephrology. Patient continues to check weight and reports home weight on yesterday was 298lbs. He is without any concerns today. Cardiovascular Management Strategies: Medication therapy, Medical device, Routine screening, Weight management, Fluid modification Weight: 298 lb (135.2 kg) (patient home reading on 05/07/24)  Respiratory Respiratory Symptoms Reported: No symptoms reported    Endocrine Endocrine Symptoms Reported: No symptoms reported Is patient diabetic?: Yes Is patient checking blood sugars at home?: Yes List most recent blood sugar readings, include date and time of day: patient reports BS fasting this morning was 152. active with clinical pharmacist following  for diabetes management upcoming telephone appointment 05/24/24. A1C    Gastrointestinal Gastrointestinal Symptoms Reported: No symptoms reported      Genitourinary  Genitourinary Symptoms Reported: No symptoms reported Additional Genitourinary Details: HD Tuesday, Thursday and Saturdays Genitourinary Management Strategies: Hemodialysis  Integumentary Integumentary Symptoms Reported: No symptoms reported    Musculoskeletal Musculoskelatal Symptoms Reviewed: No symptoms reported        Psychosocial Psychosocial Symptoms Reported: Not assessed          05/08/2024    PHQ2-9 Depression Screening   Little interest or pleasure in doing things    Feeling down, depressed, or hopeless    PHQ-2 - Total Score    Trouble falling or staying asleep, or sleeping too much    Feeling tired or having little energy    Poor appetite or overeating     Feeling bad about yourself - or that you are a failure or have let yourself or your family down    Trouble concentrating on things, such as reading the newspaper or watching television    Moving or speaking so slowly that other people could have noticed.  Or the opposite - being so fidgety or restless that you have been moving around a lot more than usual    Thoughts that you would be better off dead, or hurting yourself in some way    PHQ2-9 Total Score    If you checked off any problems, how difficult have these problems made it for you to do your work, take care of things at home, or get along with other people    Depression Interventions/Treatment      There were no vitals filed for this visit.  Medications Reviewed Today     Reviewed by Cashmere Harmes M, RN (Registered Nurse) on 05/08/24 at 1154  Med List Status: <None>   Medication Order Taking? Sig Documenting Provider Last Dose Status Informant  albuterol  (VENTOLIN  HFA) 108 (90 Base) MCG/ACT inhaler [id number] Yes Inhale 2 puffs into the lungs every 4 (four) hours as needed for wheezing or shortness of breath. [provider]  Active Self, Pharmacy Records  atorvastatin  (LIPITOR) 80 MG tablet [id number] Yes Take 1 tablet (80 mg total) by mouth at  bedtime. Thedora Garnette HERO, MD  Active   bumetanide  (BUMEX ) 2 MG tablet [id number] Yes Take 2 mg by mouth 2 (two) times daily. [provider]  Active   carvedilol  (COREG ) 25 MG tablet [id number] Yes Take 1 tablet (25 mg total) by mouth 2 (two) times daily. Thedora Garnette HERO, MD  Active   Continuous Glucose Sensor [facility] G7 15 DAY SENSOR) [place] [id number] Yes by Does not apply route. Change sensor every 15 days [provider]  Active   gabapentin  (NEURONTIN ) 100 MG capsule [id number] Yes TAKE 1 CAPSULE (100 MG TOTAL) BY MOUTH 3 (THREE) TIMES DAILY AS NEEDED (NEUROPATHY PAIN). Thedora Garnette HERO, MD  Active   insulin  aspart protamine - aspart (NOVOLOG  70/30 MIX) (70-30) 100 UNIT/ML FlexPen [id number] Yes Inject 60 Units into the skin 2 (two) times daily with a meal. Thedora Garnette HERO, MD  Active   insulin  degludec (TRESIBA FLEXTOUCH) 200 UNIT/ML FlexTouch Pen [id number]  Inject 60 Units into the skin.  Patient not taking: Reported on 05/08/2024   [provider]  Active   insulin  glargine (LANTUS ) 100 UNIT/ML Solostar Pen [id number] Yes Inject 60 Units into the skin 2 (two) times daily. Thedora Garnette HERO, MD  Active   Insulin  Pen Needle (B-D UF III  MINI PEN NEEDLES) 31G X 5 MM MISC [id number]  Use to inject insulin  4 times daily Thedora Garnette HERO, MD  Active   metolazone  (ZAROXOLYN ) 5 MG tablet [id number] Yes Take 5 mg by mouth daily. [provider]  Active   omeprazole  (PRILOSEC) 20 MG capsule [id number] Yes TAKE 1 CAPSULE BY MOUTH EVERY DAY Thedora Garnette HERO, MD  Active   Semaglutide ,0.25 or 0.5MG /DOS, (OZEMPIC , 0.25 OR 0.5 MG/DOSE,) 2 MG/1.5ML SOPN [id number] Yes Inject into the skin. [provider]  Active   traMADol  (ULTRAM ) 50 MG tablet [id number] Yes TAKE 1 TABLET BY MOUTH EVERY 8 HOURS AS NEEDED Thedora Garnette HERO, MD  Active           Recommendation:   Continue Current Plan of Care  Follow Up Plan:   Telephone follow up appointment date/time:  05/29/24 at 1:30 pm    Heddy Shutter, RN, MSN, BSN, CCM [place]  [facility], Population Health Case Manager Phone: [phone]

## 2024-05-10 DIAGNOSIS — N186 End stage renal disease: Secondary | ICD-10-CM | POA: Diagnosis not present

## 2024-05-10 DIAGNOSIS — Z992 Dependence on renal dialysis: Secondary | ICD-10-CM | POA: Diagnosis not present

## 2024-05-23 NOTE — Progress Notes (Unsigned)
   05/24/2024  Patient ID: Charlie LULLA Hurst, male   DOB: 1956-06-14, 68 y.o.   MRN: [id number]  Subjective/Objective Telephone visit to follow-up on management of diabetes and hypertension   Diabetes: Current medications: Lantus  60 units BID, Novolog  70/30 60 units BID, Ozempic  0.25mg  weekly -Medications tried in the past: metformin caused decreased kidney function -Using Dexcom G7 for CGM and states FBG is averaging 130-180 and post-prandial 220  -Patient does not have consistent meals/snacks and will skip meals at times -Would like to lose weight, which would also improve glycemic control -Patient received Tresiba 200 units/mL, Novolog  70/30, pen needles, and Ozempic  0.25/0.5mg  from Novo PAP -Patient has completed 4-5 weeks of Ozempic  0.25mg  at this time and endorses tolerating well with no adverse side effects -Still has Lantus  on hand, so he is not currently using [name] -A1c 7.8% on 10/2, down from 9.1% on 8/12 -No ACEi/ARB on board for cardiorenal protection -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 -Statin for ACVD risk reduction:  atorvastatin  80mg - last lipid panel was >1 year ago, and direct LDL was 106   Hypertension: Current medications: bumetanide  2mg  BID, carvedilol  25mg  BID, metolazone  5mg  daily -Patient continues to hold amlodipine , hydralazine , imdur  and losartan  due to hypotension during dialysis sessions -Patient does monitor home BP and endorses readings are averaging 135-140/90   Assessment/Plan:    Diabetes: -Currently uncontrolled based on A1c >7% -Increase Ozempic  to 0.5mg  weekly, decrease Lantus  and Novolog  70/30 to 50 units BID to prevent hypoglycemia -Patient will switch from Lantus  to [name] once Lantus  on hand is gone -Continue Dexcom for CGM -Sees Dr. Thedora again in November and will be due for A1c; I also recommend a lipid panel.  If LDL >70, consider addition of ezetimibe 10mg  daily -Scheduled for a follow-up with Dr. Dartha (endo) in December    Hypertension: -Currently moderately controlled, but this is complicated with hypotension occurring with dialysis treatments (patient receives dialysis Tuesday, Thursday, and Friday) -Continue bumetanide  2mg  BID, carvedilol  25mg  BID, and metolazone  5mg  daily at this time -Monitor and record home BP regularly -If patient continues to have home BP readings >130/80, consider starting lisinopril at 2.5mg  daily dosage (taking after dialysis on treatment days), increasing slowly as needed/tolerated   Follow-up:  2 weeks   Channing DELENA Mealing, PharmD, DPLA

## 2024-05-24 ENCOUNTER — Other Ambulatory Visit: Payer: Self-pay

## 2024-05-29 ENCOUNTER — Other Ambulatory Visit: Payer: Self-pay

## 2024-05-29 NOTE — Patient Instructions (Signed)
 Visit Information  Thank you for taking time to visit with me today. Please don't hesitate to contact me if I can be of assistance to you before our next scheduled appointment.  Your next care management appointment is by telephone on 05/29/24 at 1:30 pm  Please call the care guide team at [phone] if you need to cancel, schedule, or reschedule an appointment.   Please call the Suicide and Crisis Lifeline: 988 call the [place]  National Suicide Prevention Lifeline: 862-767-6495 or TTY: 343-473-4882 TTY 202 582 6845) to talk to a trained counselor if you are experiencing a Mental Health or Behavioral Health Crisis or need someone to talk to.  Heddy Shutter, RN, MSN, BSN, CCM [place]  [facility], Population Health Case Manager Phone: [phone]

## 2024-05-29 NOTE — Patient Outreach (Signed)
 Complex Care Management   Visit Note  05/29/2024  Name:  Peter Terry MRN: [id number] DOB: 03/11/1956  Situation: Referral received for Complex Care Management related to Heart Failure, ESRD, and Diabetes with Complications I obtained verbal consent from Patient.  Visit completed with Patient  on the phone  Background:   Past Medical History:  Diagnosis Date   Acute heart failure with preserved ejection fraction (HFpEF) (HCC) 05/02/2023   Acute on chronic diastolic CHF (congestive heart failure) (HCC) 08/25/2023   Acute renal failure superimposed on stage 4 chronic kidney disease (HCC) 08/26/2023   Anemia of chronic disease 09/01/2023   Cellulitis of right lower extremity 09/01/2023   CHF (congestive heart failure) (HCC)    Chronic kidney disease, stage 4 (severe) (HCC) 03/31/2023   Chronic right hip pain 07/13/2023   COPD (chronic obstructive pulmonary disease) (HCC) 03/30/2023   Diabetic peripheral neuropathy (HCC) 03/31/2023   Dyspnea on exertion 05/02/2023   Edema of both lower legs 03/31/2023   Essential hypertension 03/30/2023   GERD (gastroesophageal reflux disease) 03/31/2023   Heart Failure with Preserved EF (HCC) 03/31/2023   Hypomagnesemia 11/04/2022   Insulin  long-term use (HCC) 08/21/2020   Iron  deficiency anemia 03/31/2022   Mixed hyperlipidemia 03/30/2023   Morbid obesity with BMI of 45.0-49.9, adult (HCC) 07/25/2020   Morbid obesity with body mass index (BMI) of 50.0 to 59.9 in adult (HCC) 07/25/2020   Multiple open wounds of lower extremity 05/02/2023   Obesity, class 3 09/01/2023   Obstructive sleep apnea 03/31/2023   Reactive depression 10/11/2023   Retinal tear 02/20/2013   Secondary hyperparathyroidism of renal origin 10/11/2023   Tinea pedis 03/31/2023   Type 2 diabetes mellitus with diabetic neuropathy, unspecified (HCC) 11/04/2022   Type 2 diabetes mellitus with hyperglycemia, with long-term current use of insulin  (HCC) 05/02/2023   Type 2 diabetes  mellitus with stage 4 chronic kidney disease, with long-term current use of insulin  (HCC) 11/04/2022   Venous stasis dermatitis 03/31/2023   Venous stasis ulcer of left calf limited to breakdown of skin without varicose veins (HCC) 07/13/2023    Assessment: Patient Reported Symptoms:  Cognitive Cognitive Status: No symptoms reported      Neurological Neurological Review of Symptoms: No symptoms reported    HEENT HEENT Symptoms Reported: No symptoms reported      Cardiovascular Cardiovascular Symptoms Reported: No symptoms reported    Respiratory Respiratory Symptoms Reported: No symptoms reported    Endocrine Is patient diabetic?: Yes Is patient checking blood sugars at home?: Yes List most recent blood sugar readings, include date and time of day: BS 190  fasting this morning.    Gastrointestinal Gastrointestinal Symptoms Reported: No symptoms reported      Genitourinary Additional Genitourinary Details: HD Tuesday, Thursday and Saturday. Fistula to left are (not using/maturing). Has central venous catheter for HD. Genitourinary Management Strategies: Hemodialysis  Integumentary Integumentary Symptoms Reported: No symptoms reported    Musculoskeletal Musculoskelatal Symptoms Reviewed: No symptoms reported Other Musculoskeletal Symptoms: patient reports uses can for ambulation Musculoskeletal Management Strategies: Medication therapy, Medical device Falls in the past year?: No    Psychosocial Psychosocial Symptoms Reported: No symptoms reported          There were no vitals filed for this visit.  Medications Reviewed Today     Reviewed by Deeya Richeson M, RN (Registered Nurse) on 05/29/24 at 1336  Med List Status: <None>   Medication Order Taking? Sig Documenting Provider Last Dose Status Informant  albuterol  (VENTOLIN  HFA) 108 (90  Base) MCG/ACT inhaler [id number] Yes Inhale 2 puffs into the lungs every 4 (four) hours as needed for wheezing or shortness of breath.  [provider]  Active Self, Pharmacy Records  atorvastatin  (LIPITOR) 80 MG tablet [id number] Yes Take 1 tablet (80 mg total) by mouth at bedtime. Thedora Garnette HERO, MD  Active   bumetanide  (BUMEX ) 2 MG tablet [id number] Yes Take 2 mg by mouth 2 (two) times daily. [provider]  Active   carvedilol  (COREG ) 25 MG tablet [id number] Yes Take 1 tablet (25 mg total) by mouth 2 (two) times daily. Thedora Garnette HERO, MD  Active   Continuous Glucose Sensor [facility] G7 15 DAY SENSOR) [place] [id number]  by Does not apply route. Change sensor every 15 days [provider]  Active   gabapentin  (NEURONTIN ) 100 MG capsule [id number] Yes TAKE 1 CAPSULE (100 MG TOTAL) BY MOUTH 3 (THREE) TIMES DAILY AS NEEDED (NEUROPATHY PAIN). Thedora Garnette HERO, MD  Active   insulin  aspart protamine - aspart (NOVOLOG  70/30 MIX) (70-30) 100 UNIT/ML FlexPen [id number] Yes Inject 60 Units into the skin 2 (two) times daily with a meal.  Patient taking differently: Inject 50 Units into the skin 2 (two) times daily with a meal.   Thedora Garnette HERO, MD  Active   insulin  degludec (TRESIBA FLEXTOUCH) 200 UNIT/ML FlexTouch Pen [id number]  Inject 60 Units into the skin.  Patient not taking: Reported on 05/29/2024   [provider]  Active   insulin  glargine (LANTUS ) 100 UNIT/ML Solostar Pen [id number] Yes Inject 60 Units into the skin 2 (two) times daily.  Patient taking differently: Inject 50 Units into the skin 2 (two) times daily.   Thedora Garnette HERO, MD  Active   Insulin  Pen Needle (B-D UF III MINI PEN NEEDLES) 31G X 5 MM MISC [id number]  Use to inject insulin  4 times daily Thedora Garnette HERO, MD  Active   metolazone  (ZAROXOLYN ) 5 MG tablet [id number] Yes Take 5 mg by mouth daily. [provider]  Active   omeprazole  (PRILOSEC) 20 MG capsule [id number] Yes TAKE 1 CAPSULE BY MOUTH EVERY DAY Rudd, Garnette HERO, MD  Active   Semaglutide ,0.25 or 0.5MG /DOS, (OZEMPIC , 0.25 OR 0.5 MG/DOSE,) 2 MG/1.5ML SOPN [id number] Yes Inject  into the skin. [provider]  Active   traMADol  (ULTRAM ) 50 MG tablet [id number] Yes TAKE 1 TABLET BY MOUTH EVERY 8 HOURS AS NEEDED Thedora Garnette HERO, MD  Active           Recommendation:   AWV 06/02/24 Specialty provider follow-up new patient visit scheduled 07/2024 Update clinical pharmacist, pcp   Follow Up Plan:   Telephone follow up appointment date/time:  06/26/24 at 1:30 pm  Heddy Shutter, RN, MSN, BSN, CCM [place]  [facility], Population Health Case Manager Phone: [phone]

## 2024-05-31 DIAGNOSIS — Z992 Dependence on renal dialysis: Secondary | ICD-10-CM | POA: Diagnosis not present

## 2024-05-31 DIAGNOSIS — N186 End stage renal disease: Secondary | ICD-10-CM | POA: Diagnosis not present

## 2024-06-01 ENCOUNTER — Telehealth: Payer: Self-pay

## 2024-06-02 ENCOUNTER — Ambulatory Visit

## 2024-06-02 VITALS — BP 130/80 | HR 87 | Temp 98.3°F | Ht 68.5 in | Wt 290.2 lb

## 2024-06-02 DIAGNOSIS — Z0000 Encounter for general adult medical examination without abnormal findings: Secondary | ICD-10-CM

## 2024-06-02 NOTE — Telephone Encounter (Signed)
 Patient given 8 Novolog  mix 70/30 pen boxes  (5 in each).   Dm/cma

## 2024-06-02 NOTE — Patient Instructions (Signed)
 Peter Terry,  Thank you for taking the time for your Medicare Wellness Visit. I appreciate your continued commitment to your health goals. Please review the care plan we discussed, and feel free to reach out if I can assist you further.  Medicare recommends these wellness visits once per year to help you and your care team stay ahead of potential health issues. These visits are designed to focus on prevention, allowing your provider to concentrate on managing your acute and chronic conditions during your regular appointments.  Please note that Annual Wellness Visits do not include a physical exam. Some assessments may be limited, especially if the visit was conducted virtually. If needed, we may recommend a separate in-person follow-up with your provider.  Ongoing Care Seeing your primary care provider every 3 to 6 months helps us  monitor your health and provide consistent, personalized care.   Referrals If a referral was made during today's visit and you haven't received any updates within two weeks, please contact the referred provider directly to check on the status.  Recommended Screenings:  Health Maintenance  Topic Date Due   Hepatitis C Screening  Never done   DTaP/Tdap/Td vaccine (1 - Tdap) Never done   Complete foot exam   03/30/2024   COVID-19 Vaccine (2 - Moderna risk series) 06/21/2024   Hemoglobin A1C  09/20/2024   Eye exam for diabetics  03/27/2025   Medicare Annual Wellness Visit  06/02/2025   Colon Cancer Screening  12/07/2032   Pneumococcal Vaccine for age over 64  Completed   Flu Shot  Completed   Zoster (Shingles) Vaccine  Completed   Meningitis B Vaccine  Aged Out       06/02/2024    9:21 AM  Advanced Directives  Does Patient Have a Medical Advance Directive? No  Would patient like information on creating a medical advance directive? No - Patient declined   Advance Care Planning is important because it: Ensures you receive medical care that aligns with your  values, goals, and preferences. Provides guidance to your family and loved ones, reducing the emotional burden of decision-making during critical moments.  Vision: Annual vision screenings are recommended for early detection of glaucoma, cataracts, and diabetic retinopathy. These exams can also reveal signs of chronic conditions such as diabetes and high blood pressure.  Dental: Annual dental screenings help detect early signs of oral cancer, gum disease, and other conditions linked to overall health, including heart disease and diabetes.  Please see the attached documents for additional preventive care recommendations.

## 2024-06-02 NOTE — Progress Notes (Signed)
 Subjective:   RED MANDT is a 68 y.o. who presents for a Medicare Wellness preventive visit.  As a reminder, Annual Wellness Visits don't include a physical exam, and some assessments may be limited, especially if this visit is performed virtually. We may recommend an in-person follow-up visit with your provider if needed.  Visit Complete: In person    Persons Participating in Visit: Patient.  AWV Questionnaire: Yes: Patient Medicare AWV questionnaire was completed by the patient on 05/31/2024; I have confirmed that all information answered by patient is correct and no changes since this date.  Cardiac Risk Factors include: advanced age (>12men, >46 women);diabetes mellitus;dyslipidemia;hypertension;male gender     Objective:    Today's Vitals   06/02/24 0913  BP: 130/80  Pulse: 87  Temp: 98.3 F (36.8 C)  TempSrc: Oral  SpO2: 96%  Weight: 290 lb 3.2 oz (131.6 kg)  Height: 5' 8.5 (1.74 m)   Body mass index is 43.48 kg/m.     06/02/2024    9:21 AM 08/26/2023    2:26 PM 08/25/2023    7:54 PM  Advanced Directives  Does Patient Have a Medical Advance Directive? No  No  Would patient like information on creating a medical advance directive? No - Patient declined No - Patient declined     Current Medications (verified) Outpatient Encounter Medications as of 06/02/2024  Medication Sig   albuterol  (VENTOLIN  HFA) 108 (90 Base) MCG/ACT inhaler Inhale 2 puffs into the lungs every 4 (four) hours as needed for wheezing or shortness of breath.   atorvastatin  (LIPITOR) 80 MG tablet Take 1 tablet (80 mg total) by mouth at bedtime.   bumetanide  (BUMEX ) 2 MG tablet Take 2 mg by mouth 2 (two) times daily.   carvedilol  (COREG ) 25 MG tablet Take 1 tablet (25 mg total) by mouth 2 (two) times daily.   Continuous Glucose Sensor (DEXCOM G7 15 DAY SENSOR) MISC by Does not apply route. Change sensor every 15 days   gabapentin  (NEURONTIN ) 100 MG capsule TAKE 1 CAPSULE (100 MG TOTAL) BY  MOUTH 3 (THREE) TIMES DAILY AS NEEDED (NEUROPATHY PAIN).   insulin  aspart protamine - aspart (NOVOLOG  70/30 MIX) (70-30) 100 UNIT/ML FlexPen Inject 60 Units into the skin 2 (two) times daily with a meal. (Patient taking differently: Inject 50 Units into the skin 2 (two) times daily with a meal.)   insulin  glargine (LANTUS ) 100 UNIT/ML Solostar Pen Inject 60 Units into the skin 2 (two) times daily. (Patient taking differently: Inject 50 Units into the skin 2 (two) times daily.)   Insulin  Pen Needle (B-D UF III MINI PEN NEEDLES) 31G X 5 MM MISC Use to inject insulin  4 times daily   metolazone  (ZAROXOLYN ) 5 MG tablet Take 5 mg by mouth daily.   omeprazole  (PRILOSEC) 20 MG capsule TAKE 1 CAPSULE BY MOUTH EVERY DAY   Semaglutide ,0.25 or 0.5MG /DOS, (OZEMPIC , 0.25 OR 0.5 MG/DOSE,) 2 MG/1.5ML SOPN Inject into the skin.   traMADol  (ULTRAM ) 50 MG tablet TAKE 1 TABLET BY MOUTH EVERY 8 HOURS AS NEEDED   insulin  degludec (TRESIBA FLEXTOUCH) 200 UNIT/ML FlexTouch Pen Inject 60 Units into the skin. (Patient not taking: Reported on 06/02/2024)   No facility-administered encounter medications on file as of 06/02/2024.    Allergies (verified) Patient has no known allergies.   History: Past Medical History:  Diagnosis Date   Acute heart failure with preserved ejection fraction (HFpEF) (HCC) 05/02/2023   Acute on chronic diastolic CHF (congestive heart failure) (HCC) 08/25/2023  Acute renal failure superimposed on stage 4 chronic kidney disease (HCC) 08/26/2023   Anemia of chronic disease 09/01/2023   Cellulitis of right lower extremity 09/01/2023   CHF (congestive heart failure) (HCC)    Chronic kidney disease, stage 4 (severe) (HCC) 03/31/2023   Chronic right hip pain 07/13/2023   COPD (chronic obstructive pulmonary disease) (HCC) 03/30/2023   Diabetic peripheral neuropathy (HCC) 03/31/2023   Dyspnea on exertion 05/02/2023   Edema of both lower legs 03/31/2023   Essential hypertension 03/30/2023    GERD (gastroesophageal reflux disease) 03/31/2023   Heart Failure with Preserved EF (HCC) 03/31/2023   Hypomagnesemia 11/04/2022   Insulin  long-term use (HCC) 08/21/2020   Iron  deficiency anemia 03/31/2022   Mixed hyperlipidemia 03/30/2023   Morbid obesity with BMI of 45.0-49.9, adult (HCC) 07/25/2020   Morbid obesity with body mass index (BMI) of 50.0 to 59.9 in adult (HCC) 07/25/2020   Multiple open wounds of lower extremity 05/02/2023   Obesity, class 3 (HCC) 09/01/2023   Obstructive sleep apnea 03/31/2023   Reactive depression 10/11/2023   Retinal tear 02/20/2013   Secondary hyperparathyroidism of renal origin 10/11/2023   Tinea pedis 03/31/2023   Type 2 diabetes mellitus with diabetic neuropathy, unspecified (HCC) 11/04/2022   Type 2 diabetes mellitus with hyperglycemia, with long-term current use of insulin  (HCC) 05/02/2023   Type 2 diabetes mellitus with stage 4 chronic kidney disease, with long-term current use of insulin  (HCC) 11/04/2022   Venous stasis dermatitis 03/31/2023   Venous stasis ulcer of left calf limited to breakdown of skin without varicose veins (HCC) 07/13/2023   Past Surgical History:  Procedure Laterality Date   APPENDECTOMY     AV FISTULA REPAIR Left 04/14/2024   LUMBAR DISC SURGERY     L4-L5   Family History  Problem Relation Age of Onset   Cancer Mother    Diabetes Mother    Kidney disease Mother    Heart disease Father    Cancer Father        Colon   Kidney disease Father    Diabetes Father    Diabetes Brother    Heart disease Paternal Aunt    Social History   Socioeconomic History   Marital status: Single    Spouse name: Not on file   Number of children: 1   Years of education: Not on file   Highest education level: GED or equivalent  Occupational History   Occupation: Electronics engineer  Tobacco Use   Smoking status: Former    Types: Cigarettes   Smokeless tobacco: Not on file  Vaping Use   Vaping status: Never Used  Substance  and Sexual Activity   Alcohol use: No   Drug use: No   Sexual activity: Not Currently  Other Topics Concern   Not on file  Social History Narrative   Not on file   Social Drivers of Health   Financial Resource Strain: Medium Risk (05/31/2024)   Overall Financial Resource Strain (CARDIA)    Difficulty of Paying Living Expenses: Somewhat hard  Food Insecurity: Food Insecurity Present (05/31/2024)   Hunger Vital Sign    Worried About Running Out of Food in the Last Year: Sometimes true    Ran Out of Food in the Last Year: Sometimes true  Transportation Needs: No Transportation Needs (05/31/2024)   PRAPARE - Administrator, Civil Service (Medical): No    Lack of Transportation (Non-Medical): No  Physical Activity: Insufficiently Active (05/31/2024)   Exercise Vital Sign  Days of Exercise per Week: 3 days    Minutes of Exercise per Session: 30 min  Stress: No Stress Concern Present (05/31/2024)   [employer] of Occupational Health - Occupational Stress Questionnaire    Feeling of Stress: Only a little  Social Connections: Socially Isolated (05/31/2024)   Social Connection and Isolation Panel    Frequency of Communication with Friends and Family: Once a week    Frequency of Social Gatherings with Friends and Family: Never    Attends Religious Services: Never    Diplomatic Services operational officer: No    Attends Engineer, structural: Not on file    Marital Status: Never married    Tobacco Counseling Counseling given: Not Answered    Clinical Intake:  Pre-visit preparation completed: Yes  Pain : No/denies pain     Nutritional Status: BMI > 30  Obese Nutritional Risks: None Diabetes: Yes CBG done?: No Did pt. bring in CBG monitor from home?: No  Lab Results  Component Value Date   HGBA1C 9.1 (H) 03/20/2024   HGBA1C 9.2 (H) 10/12/2023   HGBA1C 7.6 (H) 03/31/2023     How often do you need to have someone help you when you read  instructions, pamphlets, or other written materials from your doctor or pharmacy?: 1 - Never  Interpreter Needed?: No  Information entered by :: NAllen LPN   Activities of Daily Living     05/31/2024    4:42 PM 08/26/2023    2:23 PM  In your present state of health, do you have any difficulty performing the following activities:  Hearing? 0 0  Vision? 0 0  Difficulty concentrating or making decisions? 0 0  Walking or climbing stairs? 0   Dressing or bathing? 0   Doing errands, shopping? 0 0  Preparing Food and eating ? N   Using the Toilet? N   In the past six months, have you accidently leaked urine? N   Do you have problems with loss of bowel control? N   Managing your Medications? N   Managing your Finances? N   Housekeeping or managing your Housekeeping? Y     Patient Care Team: Thedora Garnette HERO, MD as PCP - General (Family Medicine) Carlette, Eulas KIDD, MD as Consulting Physician (Nephrology) Madireddy, Alean SAUNDERS, MD as Consulting Physician (Cardiology) Wallace, Juana M, RN as VBCI Care Management  I have updated your Care Teams any recent Medical Services you may have received from other providers in the past year.     Assessment:   This is a routine wellness examination for Willson.  Hearing/Vision screen Hearing Screening - Comments:: Denies hearing issues Vision Screening - Comments:: Regular eye exams, [facility]   Goals Addressed             This Visit's Progress    Weight (lb) < 200 lb (90.7 kg)   290 lb 3.2 oz (131.6 kg)      Depression Screen     06/02/2024    9:22 AM 12/27/2023   11:09 AM 11/22/2023    2:01 PM 03/31/2023   11:22 AM  PHQ 2/9 Scores  PHQ - 2 Score 0 2 4 5   PHQ- 9 Score 0 10 14 17     Fall Risk     05/31/2024    4:42 PM 05/29/2024    2:06 PM 02/04/2024    3:11 PM 01/04/2024    3:49 PM 12/27/2023   11:04 AM  Fall Risk  Falls in the past year? 0 0 0 0 0  Number falls in past yr: 0      Injury with Fall? 0       Risk for fall due to : Medication side effect      Follow up Falls prevention discussed;Falls evaluation completed        MEDICARE RISK AT HOME:  Medicare Risk at Home Any stairs in or around the home?: (Patient-Rptd) Yes If so, are there any without handrails?: (Patient-Rptd) No Home free of loose throw rugs in walkways, pet beds, electrical cords, etc?: (Patient-Rptd) Yes Adequate lighting in your home to reduce risk of falls?: (Patient-Rptd) Yes Life alert?: (Patient-Rptd) No Use of a cane, walker or w/c?: (Patient-Rptd) Yes Grab bars in the bathroom?: (Patient-Rptd) No Shower chair or bench in shower?: (Patient-Rptd) No Elevated toilet seat or a handicapped toilet?: (Patient-Rptd) No  TIMED UP AND GO:  Was the test performed?  Yes  Length of time to ambulate 10 feet: 6 sec Gait slow and steady with assistive device  Cognitive Function: 6CIT completed        06/02/2024    9:22 AM  6CIT Screen  What Year? 0 points  What month? 0 points  What time? 0 points  Count back from 20 0 points  Months in reverse 4 points  Repeat phrase 10 points  Total Score 14 points    Immunizations Immunization History  Administered Date(s) Administered   Fluad Quad(high Dose 65+) 04/26/2024   INFLUENZA, HIGH DOSE SEASONAL PF 05/02/2023   MODERNA COVID-19 SARS-COV-2 PEDS BIVALENT BOOSTER 22yr-2yr 05/24/2024   PNEUMOCOCCAL CONJUGATE-20 05/12/2023   RSV,unspecified 05/27/2024   Zoster Recombinant(Shingrix) 04/05/2023, 10/12/2023    Screening Tests Health Maintenance  Topic Date Due   Hepatitis C Screening  Never done   DTaP/Tdap/Td (1 - Tdap) Never done   FOOT EXAM  03/30/2024   COVID-19 Vaccine (2 - Moderna risk series) 06/21/2024   HEMOGLOBIN A1C  09/20/2024   OPHTHALMOLOGY EXAM  03/27/2025   Medicare Annual Wellness (AWV)  06/02/2025   Colonoscopy  12/07/2032   Pneumococcal Vaccine: 50+ Years  Completed   Influenza Vaccine  Completed   Zoster Vaccines- Shingrix  Completed    Meningococcal B Vaccine  Aged Out    Health Maintenance Items Addressed: Vaccines Due: TDAP  Additional Screening:  Vision Screening: Recommended annual ophthalmology exams for early detection of glaucoma and other disorders of the eye. Is the patient up to date with their annual eye exam?  Yes  Who is the provider or what is the name of the office in which the patient attends annual eye exams? [facility]  Dental Screening: Recommended annual dental exams for proper oral hygiene  Community Resource Referral / Chronic Care Management: CRR required this visit?  Yes   CCM required this visit?  No   Plan:    I have personally reviewed and noted the following in the patient's chart:   Medical and social history Use of alcohol, tobacco or illicit drugs  Current medications and supplements including opioid prescriptions. Patient is not currently taking opioid prescriptions. Functional ability and status Nutritional status Physical activity Advanced directives List of other physicians Hospitalizations, surgeries, and ER visits in previous 12 months Vitals Screenings to include cognitive, depression, and falls Referrals and appointments  In addition, I have reviewed and discussed with patient certain preventive protocols, quality metrics, and best practice recommendations. A written personalized care plan for preventive services as well as general preventive  health recommendations were provided to patient.   Ardella FORBES Dawn, LPN   [id number]   After Visit Summary: (In Person-Printed) AVS printed and given to the patient  Notes: Nothing significant to report at this time.

## 2024-06-09 DIAGNOSIS — Z992 Dependence on renal dialysis: Secondary | ICD-10-CM | POA: Diagnosis not present

## 2024-06-09 DIAGNOSIS — N186 End stage renal disease: Secondary | ICD-10-CM | POA: Diagnosis not present

## 2024-06-09 DIAGNOSIS — T82898A Other specified complication of vascular prosthetic devices, implants and grafts, initial encounter: Secondary | ICD-10-CM | POA: Diagnosis not present

## 2024-06-10 DIAGNOSIS — Z992 Dependence on renal dialysis: Secondary | ICD-10-CM | POA: Diagnosis not present

## 2024-06-10 DIAGNOSIS — N186 End stage renal disease: Secondary | ICD-10-CM | POA: Diagnosis not present

## 2024-06-13 NOTE — Progress Notes (Unsigned)
   06/14/2024  Patient ID: Peter Terry, male   DOB: 01-May-1956, 68 y.o.   MRN: [id number]  Subjective/Objective Telephone visit to follow-up on management of diabetes and hypertension   Diabetes: Current medications: Lantus  50 units BID, Novolog  70/30 50 units BID, Ozempic  0.5mg  weekly -Medications tried in the past: metformin caused decreased kidney function -Using Dexcom G7 for CGM and states FBG is averaging 135 and post-prandial 185 -Patient does not have consistent meals/snacks and will skip meals at times -Would like to lose weight, which would also improve glycemic control -Patient received Tresiba 200 units/mL, Novolog  70/30, pen needles, and Ozempic  0.25/0.5mg  from Novo PAP- he is currently down to 1 full pen of Ozempic  0.25/0.5mg  -Patient increase Ozempic  to 0.5mg  weekly 2 weeks ago and decreased both Lantus  and Novolog  70/30 to 50 units BID -Endorses tolerating Ozempic  dose increase well so far with no noted adverse side effects -Still has Lantus  on hand, so he is not currently using Tresiba -A1c 7.8% on 10/2, down from 9.1% on 8/12 -No ACEi/ARB on board for cardiorenal protection -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 -Statin for ACVD risk reduction:  atorvastatin  80mg - last lipid panel was >1 year ago, and direct LDL was 106   Hypertension: Current medications: bumetanide  2mg  BID, carvedilol  25mg  BID, metolazone  5mg  daily -Patient continues to hold amlodipine , hydralazine , imdur  and losartan  due to hypotension during dialysis sessions -Patient does monitor home BP and endorses readings are averaging 135-140/90   Assessment/Plan:    Diabetes: -Currently uncontrolled based on A1c >7%, but home BG readings reflect improved control -Continue current regimen at this time -Coordinating with medication assistance team and PCP to work on [employer] PAP reorder form, so we can hopefully get Mr. Deridder 1-2 more Ozempic  pens prior to [facility] when he will no longer be able to get this  medication through the program -Patient was previously on Trulicity  and states he was paying around $4 for this medication; so hopefully Ozempic  will be affordable on insurance.  I will work on a test claim to verify coverage/copay -Goal to increase Ozempic  to 1mg  weekly in the future to be able to further increase insulin  need -Patient will switch from Lantus  to Tresiba once Lantus  on hand is gone -Continue Dexcom for CGM -Sees Dr. Thedora again in November and will be due for A1c; I also recommend a lipid panel.  If LDL >70, consider addition of ezetimibe 10mg  daily -Scheduled for a follow-up with Dr. Dartha (endo) in December   Hypertension: -Currently moderately controlled, but this is complicated with hypotension occurring with dialysis treatments (patient receives dialysis Tuesday, Thursday, and Friday) -Continue bumetanide  2mg  BID, carvedilol  25mg  BID, and metolazone  5mg  daily at this time -Monitor and record home BP regularly -If patient continues to have home BP readings >130/80, consider starting lisinopril at 2.5mg  daily dosage (taking after dialysis on treatment days), increasing slowly as needed/tolerated   Follow-up:  3 weeks   Channing DELENA Mealing, PharmD, DPLA

## 2024-06-14 ENCOUNTER — Telehealth: Payer: Self-pay

## 2024-06-14 ENCOUNTER — Other Ambulatory Visit: Payer: Self-pay

## 2024-06-14 DIAGNOSIS — E1122 Type 2 diabetes mellitus with diabetic chronic kidney disease: Secondary | ICD-10-CM

## 2024-06-14 DIAGNOSIS — I10 Essential (primary) hypertension: Secondary | ICD-10-CM

## 2024-06-14 DIAGNOSIS — E782 Mixed hyperlipidemia: Secondary | ICD-10-CM

## 2024-06-14 NOTE — Telephone Encounter (Signed)
 Completed refill form for Ozempic  (Novo Nordisk) and faxed to Provider's office for review and signature.

## 2024-06-20 ENCOUNTER — Encounter: Payer: Self-pay | Admitting: Family Medicine

## 2024-06-20 ENCOUNTER — Ambulatory Visit: Payer: Self-pay | Admitting: Family Medicine

## 2024-06-20 ENCOUNTER — Ambulatory Visit (INDEPENDENT_AMBULATORY_CARE_PROVIDER_SITE_OTHER): Admitting: Family Medicine

## 2024-06-20 VITALS — BP 118/64 | HR 85 | Temp 98.2°F | Ht 68.5 in | Wt 282.0 lb

## 2024-06-20 DIAGNOSIS — Z992 Dependence on renal dialysis: Secondary | ICD-10-CM

## 2024-06-20 DIAGNOSIS — Z1159 Encounter for screening for other viral diseases: Secondary | ICD-10-CM | POA: Diagnosis not present

## 2024-06-20 DIAGNOSIS — Z794 Long term (current) use of insulin: Secondary | ICD-10-CM

## 2024-06-20 DIAGNOSIS — N186 End stage renal disease: Secondary | ICD-10-CM

## 2024-06-20 DIAGNOSIS — I10 Essential (primary) hypertension: Secondary | ICD-10-CM | POA: Diagnosis not present

## 2024-06-20 DIAGNOSIS — I872 Venous insufficiency (chronic) (peripheral): Secondary | ICD-10-CM

## 2024-06-20 DIAGNOSIS — R7689 Other specified abnormal immunological findings in serum: Secondary | ICD-10-CM

## 2024-06-20 DIAGNOSIS — I5022 Chronic systolic (congestive) heart failure: Secondary | ICD-10-CM

## 2024-06-20 DIAGNOSIS — B353 Tinea pedis: Secondary | ICD-10-CM

## 2024-06-20 DIAGNOSIS — E1122 Type 2 diabetes mellitus with diabetic chronic kidney disease: Secondary | ICD-10-CM

## 2024-06-20 LAB — HEMOGLOBIN A1C: Hgb A1c MFr Bld: 7.6 % — ABNORMAL HIGH (ref 4.6–6.5)

## 2024-06-20 LAB — GLUCOSE, RANDOM: Glucose, Bld: 197 mg/dL — ABNORMAL HIGH (ref 70–99)

## 2024-06-20 MED ORDER — CLOTRIMAZOLE 1 % EX CREA: 1.0000 | TOPICAL_CREAM | Freq: Two times a day (BID) | CUTANEOUS | 0 refills | Status: AC

## 2024-06-20 NOTE — Assessment & Plan Note (Addendum)
 Blood pressure is at goal. Continue carvedilol  25 mg bid.

## 2024-06-20 NOTE — Assessment & Plan Note (Addendum)
 Diabetes has been in poor control. Continue insulin  glargine (Lantus ) 60 units bid, insulin  protamine-aspart (Novolog  70/30) 60 units bid, and semaglutide  (Ozempic ) 2 mg weekly. I will check an A1c today.

## 2024-06-20 NOTE — Assessment & Plan Note (Signed)
 I iwll prescribe an antifungal cream for this. Mr. Peter Terry will ask his sister to help him apply this.

## 2024-06-20 NOTE — Assessment & Plan Note (Signed)
 The lower legs have significant skin changes, but no open sores at present.

## 2024-06-20 NOTE — Assessment & Plan Note (Signed)
 On dialysis and followed by nephrology.

## 2024-06-20 NOTE — Assessment & Plan Note (Addendum)
 Compensated. Weight is down 15 lbs from his last visit with me. Continue carvedilol  25 mg bid, bumetanide  (Bumex ) 2 mg twice daily, and metolazone  5 mg every other day.

## 2024-06-20 NOTE — Progress Notes (Signed)
 [facility] [address] [place] [address] Dept: [phone] Dept Fax: [phone]  Chronic Care Office Visit  Subjective:    Patient ID: Peter Terry, male    DOB: 09-26-1955, 68 y.o..   MRN: [id number]  Chief Complaint  Patient presents with   Diabetes     3 month f/u.   No concerns.        History of Present Illness:  Patient is in today for reassessment of chronic medical conditions.   Peter Terry has a history of heart failure with preserved ejection fraction. This has been complicated by acute renal failure on top of ESRD. He also has essential hypertension, COPD, OSA, Type 2 diabetes, morbid obesity, anemia of chronic disease, and chronic stasis dermatitis. His HF is currently managed on carvedilol  25 mg bid and bumetanide  (Bumex ) 2 mg twice daily with metolazone  5 mg every other day for heart failure management. He remains off other Bp meds. He admits that the swelling in his lower legs is improved compared to previously.   Peter Terry has ESRD. He is on dialysis. He has had issues with his vascular access, so currently has a subclavian catheter in place. The nephrologist has been able to help him with gradual diuresis. He is no longer having seepign of serous fluid form his legs.   Peter Terry has a Type 2 diabetes diagnosed 20+ years ago. He is currently managed on insulin  glargine (Lantus ) 60 units twice daily, insulin  aspart protamine (Novolog  70/30) 60 units twice daily, and semaglutide  (Ozempic ) 2 mg weekly.   Peter Terry has peripheral neuropathy. He is managed on gabapentin  100 mg TID and takes tramadol  about twice a week.  Past Medical History: Patient Active Problem List   Diagnosis Date Noted   Preop cardiovascular exam 05/01/2024   Impaired mobility and ADLs 02/18/2024   Chronic kidney disease requiring chronic dialysis (HCC) 01/10/2024   Arteriovenous fistula, acquired 11/19/2023   Reactive depression  10/11/2023   Secondary hyperparathyroidism of renal origin 10/11/2023   Anemia of chronic disease 09/01/2023   Obesity, class 3 (HCC) 09/01/2023   Chronic right hip pain 07/13/2023   Venous stasis ulcer of left calf limited to breakdown of skin without varicose veins (HCC) 07/13/2023   Dyspnea on exertion 05/02/2023   Obstructive sleep apnea 03/31/2023   GERD (gastroesophageal reflux disease) 03/31/2023   Edema of both lower legs 03/31/2023   Venous stasis dermatitis 03/31/2023   Tinea pedis 03/31/2023   Heart Failure with Preserved EF (HCC) 03/31/2023   Diabetic peripheral neuropathy (HCC) 03/31/2023   Mixed hyperlipidemia 03/30/2023   Essential hypertension 03/30/2023   COPD (chronic obstructive pulmonary disease) (HCC) 03/30/2023   Type 2 diabetes mellitus with diabetic chronic kidney disease (HCC) 11/04/2022   Hypomagnesemia 11/04/2022   Insulin  long-term use (HCC) 08/21/2020   Morbid obesity with body mass index (BMI) of 50.0 to 59.9 in adult (HCC) 07/25/2020   Retinal tear 02/20/2013   Past Surgical History:  Procedure Laterality Date   APPENDECTOMY     AV FISTULA REPAIR Left 04/14/2024   LUMBAR DISC SURGERY     L4-L5   Family History  Problem Relation Age of Onset   Cancer Mother    Diabetes Mother    Kidney disease Mother    Heart disease Father    Cancer Father        Colon   Kidney disease Father    Diabetes Father    Diabetes Brother  Heart disease Paternal Aunt    Outpatient Medications Prior to Visit  Medication Sig Dispense Refill   albuterol  (VENTOLIN  HFA) 108 (90 Base) MCG/ACT inhaler Inhale 2 puffs into the lungs every 4 (four) hours as needed for wheezing or shortness of breath.     atorvastatin  (LIPITOR) 80 MG tablet Take 1 tablet (80 mg total) by mouth at bedtime. 90 tablet 3   bumetanide  (BUMEX ) 2 MG tablet Take 2 mg by mouth 2 (two) times daily.     carvedilol  (COREG ) 25 MG tablet Take 1 tablet (25 mg total) by mouth 2 (two) times daily. 90  tablet 3   Continuous Glucose Sensor (DEXCOM G7 15 DAY SENSOR) MISC by Does not apply route. Change sensor every 15 days     gabapentin  (NEURONTIN ) 100 MG capsule TAKE 1 CAPSULE (100 MG TOTAL) BY MOUTH 3 (THREE) TIMES DAILY AS NEEDED (NEUROPATHY PAIN). 90 capsule 3   insulin  aspart protamine - aspart (NOVOLOG  70/30 MIX) (70-30) 100 UNIT/ML FlexPen Inject 60 Units into the skin 2 (two) times daily with a meal. (Patient taking differently: Inject 50 Units into the skin 2 (two) times daily with a meal.) 30 mL 1   insulin  degludec (TRESIBA FLEXTOUCH) 200 UNIT/ML FlexTouch Pen Inject 60 Units into the skin. (Patient not taking: Reported on 06/14/2024)     insulin  glargine (LANTUS ) 100 UNIT/ML Solostar Pen Inject 60 Units into the skin 2 (two) times daily. (Patient taking differently: Inject 50 Units into the skin 2 (two) times daily.) 15 mL 11   Insulin  Pen Needle (B-D UF III MINI PEN NEEDLES) 31G X 5 MM MISC Use to inject insulin  4 times daily 400 each 1   metolazone  (ZAROXOLYN ) 5 MG tablet Take 5 mg by mouth daily.     omeprazole  (PRILOSEC) 20 MG capsule TAKE 1 CAPSULE BY MOUTH EVERY DAY 90 capsule 3   Semaglutide ,0.25 or 0.5MG /DOS, (OZEMPIC , 0.25 OR 0.5 MG/DOSE,) 2 MG/1.5ML SOPN Inject into the skin. (Patient taking differently: Inject 0.5 mg into the skin once a week.)     traMADol  (ULTRAM ) 50 MG tablet TAKE 1 TABLET BY MOUTH EVERY 8 HOURS AS NEEDED 30 tablet 2   No facility-administered medications prior to visit.   No Known Allergies   Objective:   Today's Vitals   06/20/24 1318  BP: 118/64  Pulse: 85  Temp: 98.2 F (36.8 C)  TempSrc: Temporal  SpO2: 97%  Weight: 282 lb (127.9 kg)  Height: 5' 8.5 (1.74 m)   Body mass index is 42.25 kg/m.   General: Well developed, well nourished. No acute distress. CV: RRR without murmurs or rubs. Extremities: There is 3-4+ edema of both lower legs, with woody changes to the skin, darkenign of the lower legs, and scaliness.   No open wounds at  present and no sign of serous drainage. Feet- Skin intact. Mild maceration between toes on left foot. Nails are normal. Dorsalis pedis and posterior tibial artery pulses are   normal. 5.07 monofilament testing shows multiple areas that are insensate. Psych: Alert and oriented. Normal mood and affect.  Health Maintenance Due  Topic Date Due   Hepatitis C Screening  Never done   DTaP/Tdap/Td (1 - Tdap) Never done   COVID-19 Vaccine (2 - Moderna risk series) 06/21/2024     Assessment & Plan:   Problem List Items Addressed This Visit       Cardiovascular and Mediastinum   Essential hypertension   Blood pressure is at goal. Continue carvedilol  25 mg bid.  Heart Failure with Preserved EF (HCC) - Primary   Compensated. Weight is down 15 lbs from his last visit with me. Continue carvedilol  25 mg bid, bumetanide  (Bumex ) 2 mg twice daily, and metolazone  5 mg every other day.        Endocrine   Type 2 diabetes mellitus with diabetic chronic kidney disease (HCC)   Diabetes has been in poor control. Continue insulin  glargine (Lantus ) 60 units bid, insulin  protamine-aspart (Novolog  70/30) 60 units bid, and semaglutide  (Ozempic ) 2 mg weekly. I will check an A1c today.      Relevant Orders   Glucose, random (Completed)   Hemoglobin A1c (Completed)     Musculoskeletal and Integument   Tinea pedis   I iwll prescribe an antifungal cream for this. Peter Terry will ask his sister to help him apply this.      Relevant Medications   clotrimazole (CLOTRIMAZOLE ANTI-FUNGAL) 1 % cream   Venous stasis dermatitis   The lower legs have significant skin changes, but no open sores at present.        Genitourinary   Chronic kidney disease requiring chronic dialysis [facility])   On dialysis and followed by nephrology.      Other Visit Diagnoses       Encounter for hepatitis C screening test for low risk patient       Relevant Orders   HCV Ab w Reflex to Quant PCR     Encounter for long-term  (current) insulin  use (HCC)           Return in about 3 months (around 09/20/2024) for Reassessment.   Garnette CHRISTELLA Simpler, MD  I,Emily Lagle,acting as a scribe for Garnette CHRISTELLA Simpler, MD.,have documented all relevant documentation on the behalf of Garnette CHRISTELLA Simpler, MD.  I, Garnette CHRISTELLA Simpler, MD, have reviewed all documentation for this visit. The documentation on 06/20/2024 for the exam, diagnosis, procedures, and orders are all accurate and complete.

## 2024-06-22 DIAGNOSIS — R7689 Other specified abnormal immunological findings in serum: Secondary | ICD-10-CM | POA: Insufficient documentation

## 2024-06-22 LAB — HCV RT-PCR, QUANT (NON-GRAPH): Hepatitis C Quantitation: NOT DETECTED [IU]/mL

## 2024-06-22 LAB — HCV AB W REFLEX TO QUANT PCR: HCV Ab: REACTIVE — AB

## 2024-06-26 ENCOUNTER — Other Ambulatory Visit: Payer: Self-pay | Admitting: *Deleted

## 2024-06-26 ENCOUNTER — Telehealth: Payer: Self-pay

## 2024-06-26 NOTE — Telephone Encounter (Signed)
 PAP: Patient assistance application for Novolog  Mix 70/30 through [employer] has been mailed to pt's home address on file. Provider portion of application will be faxed to provider's office. Patient portion e-filed.

## 2024-06-26 NOTE — Patient Instructions (Signed)
 Visit Information  Thank you for taking time to visit with me today. Please don't hesitate to contact me if I can be of assistance to you before our next scheduled appointment.  Your next care management appointment is by telephone on 07/26/2024 at 2:00 pm  Please call the care guide team at [phone] if you need to cancel, schedule, or reschedule an appointment.   Please call the Suicide and Crisis Lifeline: 988 call the [place]  National Suicide Prevention Lifeline: 5126892827 or TTY: 470-517-3504 TTY 7163334676) to talk to a trained counselor call 1-800-273-TALK (toll free, 24 hour hotline) if you are experiencing a Mental Health or Behavioral Health Crisis or need someone to talk to.  Olam Ku, RN, BSN [place]  [facility], [facility] RN Care Manager Direct Dial: [phone]  Fax: [phone]

## 2024-06-26 NOTE — Patient Outreach (Signed)
 Complex Care Management   Visit Note  06/26/2024  Name:  Peter Terry MRN: [id number] DOB: January 28, 1956  Situation: Referral received for Complex Care Management related to Heart Failure, ESRD, and Diabetes with Complications I obtained verbal consent from Patient.  Visit completed with Patient  on the phone  Background:   Past Medical History:  Diagnosis Date   Acute heart failure with preserved ejection fraction (HFpEF) (HCC) 05/02/2023   Acute on chronic diastolic CHF (congestive heart failure) (HCC) 08/25/2023   Acute renal failure superimposed on stage 4 chronic kidney disease (HCC) 08/26/2023   Anemia of chronic disease 09/01/2023   Cellulitis of right lower extremity 09/01/2023   CHF (congestive heart failure) (HCC)    Chronic kidney disease, stage 4 (severe) (HCC) 03/31/2023   Chronic right hip pain 07/13/2023   COPD (chronic obstructive pulmonary disease) (HCC) 03/30/2023   Diabetic peripheral neuropathy (HCC) 03/31/2023   Dyspnea on exertion 05/02/2023   Edema of both lower legs 03/31/2023   Essential hypertension 03/30/2023   GERD (gastroesophageal reflux disease) 03/31/2023   Heart Failure with Preserved EF (HCC) 03/31/2023   Hypomagnesemia 11/04/2022   Insulin  long-term use (HCC) 08/21/2020   Iron  deficiency anemia 03/31/2022   Mixed hyperlipidemia 03/30/2023   Morbid obesity with BMI of 45.0-49.9, adult (HCC) 07/25/2020   Morbid obesity with body mass index (BMI) of 50.0 to 59.9 in adult (HCC) 07/25/2020   Multiple open wounds of lower extremity 05/02/2023   Obesity, class 3 (HCC) 09/01/2023   Obstructive sleep apnea 03/31/2023   Reactive depression 10/11/2023   Retinal tear 02/20/2013   Secondary hyperparathyroidism of renal origin 10/11/2023   Tinea pedis 03/31/2023   Type 2 diabetes mellitus with diabetic neuropathy, unspecified (HCC) 11/04/2022   Type 2 diabetes mellitus with hyperglycemia, with long-term current use of insulin  (HCC) 05/02/2023   Type 2  diabetes mellitus with stage 4 chronic kidney disease, with long-term current use of insulin  (HCC) 11/04/2022   Venous stasis dermatitis 03/31/2023   Venous stasis ulcer of left calf limited to breakdown of skin without varicose veins (HCC) 07/13/2023    Assessment: Pt continues to do well and reports he is awaiting on new dentures to be delivered to his orthodontist office.  Patient Reported Symptoms:  Cognitive Cognitive Status: Able to follow simple commands, Alert and oriented to person, place, and time, Normal speech and language skills   Health Maintenance Behaviors: Annual physical exam Health Facilitated by: Rest  Neurological Neurological Review of Symptoms: No symptoms reported Oher Neurological Symptoms/Conditions [RPT]: Gabapentin  when needed Neurological Management Strategies: Routine screening  HEENT HEENT Symptoms Reported: No symptoms reported      Cardiovascular Cardiovascular Symptoms Reported: No symptoms reported (Verified pt is in the GREEN zone with no related HF symptoms, Educate on HF symptoms and what to do if acute.) Does patient have uncontrolled Hypertension?: Yes Is patient checking Blood Pressure at home?: Yes Patient's Recent BP reading at home: Pt did not have a current reading however EPIC indicates last reading via office visit was 118/64 on 06/20/2024-pt remains asymptomatic Cardiovascular Management Strategies: Medication therapy, Medical device, Routine screening Do You Have a Working Readable Scale?: Yes Cardiovascular Self-Management Outcome: 4 (good) Cardiovascular Comment: Denies any HF symptons. Ongoing left leg swelling with use of compression stockings and evaluate bilateral LE  Respiratory Respiratory Symptoms Reported: No symptoms reported Respiratory Management Strategies: Routine screening  Endocrine Endocrine Symptoms Reported: No symptoms reported Is patient diabetic?: Yes Is patient checking blood sugars at home?: Yes List most  recent  blood sugar readings, include date and time of day: CBG fasting reports this morning it was low does not remember the reading however recent random read after a meal at 231 and pt remains asymptomatic. (Dexcom) Endocrine Self-Management Outcome: 3 (uncertain)  Gastrointestinal Gastrointestinal Symptoms Reported: No symptoms reported      Genitourinary Genitourinary Symptoms Reported: No symptoms reported Additional Genitourinary Details: HD continue on Tues/Thurs/Sat using central venous catheter. Genitourinary Management Strategies: Hemodialysis Hemodialysis Schedule: HD continue on Tues/Thurs/Sat using central venous catheter. Hemodialysis Last Treatment: 06/24/24 Genitourinary Self-Management Outcome: 4 (good)  Integumentary Integumentary Symptoms Reported: No symptoms reported Additional Integumentary Details: Central cath remains patent with no signs of irritation or infection. Change central line dressing every Tuesday. Skin Management Strategies: Routine screening, Medication therapy, Fluid modification, Coping strategies Skin Comment: Report ocassional use of compression stockings and LE elvation with any swelling.  Musculoskeletal Musculoskelatal Symptoms Reviewed: No symptoms reported Other Musculoskeletal Symptoms: Continue to use his cane for ambulation Musculoskeletal Management Strategies: Medication therapy, Medical device, Routine screening      Psychosocial Psychosocial Symptoms Reported: No symptoms reported           There were no vitals filed for this visit. Pain Scale: 0-10 Pain Score: 0-No pain  Medications Reviewed Today     Reviewed by Alvia Olam BIRCH, RN (Registered Nurse) on 06/26/24 at 1404  Med List Status: <None>   Medication Order Taking? Sig Documenting Provider Last Dose Status Informant  albuterol  (VENTOLIN  HFA) 108 (90 Base) MCG/ACT inhaler [id number] Yes Inhale 2 puffs into the lungs every 4 (four) hours as needed for wheezing or shortness of  breath. [provider]  Active Self, Pharmacy Records  atorvastatin  (LIPITOR) 80 MG tablet [id number] Yes Take 1 tablet (80 mg total) by mouth at bedtime. Thedora Garnette HERO, MD  Active   bumetanide  (BUMEX ) 2 MG tablet [id number] Yes Take 2 mg by mouth 2 (two) times daily. [provider]  Active   carvedilol  (COREG ) 25 MG tablet [id number] Yes Take 1 tablet (25 mg total) by mouth 2 (two) times daily. Thedora Garnette HERO, MD  Active   chlorhexidine (PERIDEX) 0.12 % solution [id number] Yes RINSE MOUTH WITH (1 CAPFUL) FOR 30 SECONDS IN MORNING AND EVENING AFTER BRUSHING, THEN SPIT [provider]  Active   clotrimazole (CLOTRIMAZOLE ANTI-FUNGAL) 1 % cream [id number] Yes Apply 1 Application topically 2 (two) times daily. Thedora Garnette HERO, MD  Active   Continuous Glucose Sensor [facility] G7 15 DAY SENSOR) [place] [id number] Yes by Does not apply route. Change sensor every 15 days [provider]  Active   gabapentin  (NEURONTIN ) 100 MG capsule [id number] Yes TAKE 1 CAPSULE (100 MG TOTAL) BY MOUTH 3 (THREE) TIMES DAILY AS NEEDED (NEUROPATHY PAIN). Thedora Garnette HERO, MD  Active   insulin  aspart protamine - aspart (NOVOLOG  70/30 MIX) (70-30) 100 UNIT/ML FlexPen [id number] Yes Inject 60 Units into the skin 2 (two) times daily with a meal.  Patient taking differently: Inject 60 Units into the skin 2 (two) times daily with a meal.   Thedora Garnette HERO, MD  Active   insulin  glargine (LANTUS ) 100 UNIT/ML Solostar Pen [id number] Yes Inject 60 Units into the skin 2 (two) times daily.  Patient taking differently: Inject 60 Units into the skin 2 (two) times daily.   Thedora Garnette HERO, MD  Active   Insulin  Pen Needle (B-D UF III MINI PEN NEEDLES) 31G X 5 MM MISC [id number] Yes Use to inject insulin  4 times daily Rudd,  Garnette HERO, MD  Active   metolazone  (ZAROXOLYN ) 5 MG tablet [id number] Yes Take 5 mg by mouth daily. [provider]  Active   omeprazole  (PRILOSEC) 20 MG capsule [id number] Yes TAKE 1  CAPSULE BY MOUTH EVERY DAY Rudd, Garnette HERO, MD  Active   Semaglutide ,0.25 or 0.5MG /DOS, (OZEMPIC , 0.25 OR 0.5 MG/DOSE,) 2 MG/1.5ML SOPN [id number] Yes Inject into the skin. [provider]  Active   traMADol  (ULTRAM ) 50 MG tablet [id number] Yes TAKE 1 TABLET BY MOUTH EVERY 8 HOURS AS NEEDED Thedora Garnette HERO, MD  Active             Recommendation:   PCP Follow-up Specialty provider follow-up New Endocrinologist 07/19/2024 @ 8:40 am Continue Current Plan of Care  Follow Up Plan:   Telephone follow up appointment date/time:  07/26/2024 @ 2:00 pm   Olam Ku, RN, BSN [place]  [facility], [facility] RN Care Manager Direct Dial: [phone]  Fax: [phone]

## 2024-06-27 DIAGNOSIS — N186 End stage renal disease: Secondary | ICD-10-CM | POA: Diagnosis not present

## 2024-06-27 DIAGNOSIS — I770 Arteriovenous fistula, acquired: Secondary | ICD-10-CM | POA: Diagnosis not present

## 2024-06-27 NOTE — Telephone Encounter (Signed)
 PAP: Application for Novolog  Mix 70/30 has been submitted to [employer], via fax

## 2024-07-05 ENCOUNTER — Other Ambulatory Visit: Payer: Self-pay

## 2024-07-05 DIAGNOSIS — E1122 Type 2 diabetes mellitus with diabetic chronic kidney disease: Secondary | ICD-10-CM

## 2024-07-05 DIAGNOSIS — E782 Mixed hyperlipidemia: Secondary | ICD-10-CM

## 2024-07-05 DIAGNOSIS — I10 Essential (primary) hypertension: Secondary | ICD-10-CM

## 2024-07-05 NOTE — Progress Notes (Signed)
   07/05/24  Patient ID: Charlie LULLA Hurst, male   DOB: [birth year], 68 y.o.   MRN: [id number]  Subjective/Objective Telephone visit to follow-up on management of diabetes and hypertension   Diabetes: Current medications: Lantus  50 units BID, Novolog  70/30 50 units BID, Ozempic  0.5mg  weekly -Medications tried in the past: metformin caused decreased kidney function -Using Dexcom G7 for CGM and states FBG is averaging 155 and post-prandial 185 -Patient does not have consistent meals/snacks and will skip meals at times -Would like to lose weight, which would also improve glycemic control -Patient received Tresiba 200 units/mL, Novolog  70/30, pen needles, and Ozempic  0.25/0.5mg  from [employer]- he is currently down to less than 1 full pen of Ozempic  0.25/0.5mg  -Patient increase Ozempic  to 0.5mg  weekly approximately 5 weeks ago and decreased both Lantus  and Novolog  70/30 to 50 units BID -Endorses tolerating Ozempic  dose increase well so far with no noted adverse side effects -Still has Lantus  on hand, so he is not currently using Tresiba -A1c 7.6% 11/11, down from 9.1% on 8/12 -No ACEi/ARB on board for cardiorenal protection -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 -Statin for ACVD risk reduction:  atorvastatin  80mg - last lipid panel was >1 year ago, and direct LDL was 106   Hypertension: Current medications: bumetanide  2mg  BID, carvedilol  25mg  BID, metolazone  5mg  daily -Patient continues to hold amlodipine , hydralazine , imdur  and losartan  due to hypotension during dialysis sessions -Patient does monitor home BP and endorses readings are averaging 130-140/60-70   Assessment/Plan:    Diabetes: -Currently uncontrolled based on A1c >7% -Continue current regimen at this time -Reorder form for Ozempic  is currently being worked on to get additional medication before EOY- I will follow-up with the medication assistance team to check on status of this.  Test claim reflects $0 copay for Ozempic  on  patient's insurance, so hopefully he can continue therapy on insurance since he will not receive from [employer] after January. -Re-enrollment application with [employer] P[employer] for 2026 is in process for patient to continue to receive Novolog  70/30.   -Patient will switch from Lantus  to Tresiba once Lantus  on hand is gone; we can add Tresiba to [employer] PAP if/when refill is needed- currently has plenty of long-acting insulin  on hand -Goal to increase Ozempic  to 1mg  weekly in the future to be able to further decrease insulin  need -Patient sees Dr. Dartha with endocrinology to establish care 12/10 -Continue Dexcom for CGM -Due for lipid panel.  If LDL >70, consider addition of ezetimibe 10mg  daily  Hypertension: -Currently moderately controlled, but this is complicated with hypotension occurring with dialysis treatments (patient receives dialysis Tuesday, Thursday, and Friday) -Continue bumetanide  2mg  BID, carvedilol  25mg  BID, and metolazone  5mg  daily at this time -Monitor and record home BP regularly -If patient continues to have home BP readings >130/80, consider starting lisinopril at 2.5mg  daily dosage (taking after dialysis on treatment days), increasing slowly as needed/tolerated   Follow-up:  1/14   Channing DELENA Mealing, PharmD, DPLA

## 2024-07-19 ENCOUNTER — Ambulatory Visit: Admitting: "Endocrinology

## 2024-07-21 ENCOUNTER — Other Ambulatory Visit: Payer: Self-pay | Admitting: Family Medicine

## 2024-07-21 DIAGNOSIS — E1140 Type 2 diabetes mellitus with diabetic neuropathy, unspecified: Secondary | ICD-10-CM

## 2024-07-26 ENCOUNTER — Other Ambulatory Visit: Payer: Self-pay

## 2024-07-26 NOTE — Patient Instructions (Signed)
 Visit Information  Thank you for taking time to visit with me today. Please don't hesitate to contact me if I can be of assistance to you before our next scheduled appointment.  Your next care management appointment is by telephone on 08/28/24 at 1:30 pm  Please call the care guide team at [phone] if you need to cancel, schedule, or reschedule an appointment.   Please call the Suicide and Crisis Lifeline: 988 call the [place]  National Suicide Prevention Lifeline: 6703302598 or TTY: (713)143-2498 TTY [phone]) to talk to a trained counselor if you are experiencing a Mental Health or Behavioral Health Crisis or need someone to talk to.  Heddy Shutter, RN, MSN, BSN, CCM  [facility]  [facility], Population Health Case Manager Phone: [phone]

## 2024-07-26 NOTE — Patient Instructions (Signed)
 Charlie LULLA Hurst - I am sorry I was unable to reach you today for our scheduled appointment. I work with Thedora, Garnette HERO, MD and am calling to support your healthcare needs. Please contact me at [phone] at your earliest convenience. I look forward to speaking with you soon.   Thank you,  Heddy Shutter, RN, MSN, BSN, CCM [place]  [facility], Population Health Case Manager Phone: [phone]

## 2024-07-26 NOTE — Patient Outreach (Signed)
 Complex Care Management   Visit Note  07/26/2024  Name:  Peter Terry MRN: [id number] DOB: December 03, 1955  Situation: Referral received for Complex Care Management related to ESRD and Diabetes with Complications I obtained verbal consent from Patient.  Visit completed with Patient  on the phone  Background:   Past Medical History:  Diagnosis Date   Acute heart failure with preserved ejection fraction (HFpEF) (HCC) 05/02/2023   Acute on chronic diastolic CHF (congestive heart failure) (HCC) 08/25/2023   Acute renal failure superimposed on stage 4 chronic kidney disease (HCC) 08/26/2023   Anemia of chronic disease 09/01/2023   Cellulitis of right lower extremity 09/01/2023   CHF (congestive heart failure) (HCC)    Chronic kidney disease, stage 4 (severe) (HCC) 03/31/2023   Chronic right hip pain 07/13/2023   COPD (chronic obstructive pulmonary disease) (HCC) 03/30/2023   Diabetic peripheral neuropathy (HCC) 03/31/2023   Dyspnea on exertion 05/02/2023   Edema of both lower legs 03/31/2023   Essential hypertension 03/30/2023   GERD (gastroesophageal reflux disease) 03/31/2023   Heart Failure with Preserved EF (HCC) 03/31/2023   Hypomagnesemia 11/04/2022   Insulin  long-term use (HCC) 08/21/2020   Iron  deficiency anemia 03/31/2022   Mixed hyperlipidemia 03/30/2023   Morbid obesity with BMI of 45.0-49.9, adult (HCC) 07/25/2020   Morbid obesity with body mass index (BMI) of 50.0 to 59.9 in adult (HCC) 07/25/2020   Multiple open wounds of lower extremity 05/02/2023   Obesity, class 3 (HCC) 09/01/2023   Obstructive sleep apnea 03/31/2023   Reactive depression 10/11/2023   Retinal tear 02/20/2013   Secondary hyperparathyroidism of renal origin 10/11/2023   Tinea pedis 03/31/2023   Type 2 diabetes mellitus with diabetic neuropathy, unspecified (HCC) 11/04/2022   Type 2 diabetes mellitus with hyperglycemia, with long-term current use of insulin  (HCC) 05/02/2023   Type 2 diabetes mellitus  with stage 4 chronic kidney disease, with long-term current use of insulin  (HCC) 11/04/2022   Venous stasis dermatitis 03/31/2023   Venous stasis ulcer of left calf limited to breakdown of skin without varicose veins (HCC) 07/13/2023    Assessment: Patient Reported Symptoms:  Cognitive Cognitive Status: No symptoms reported, Alert and oriented to person, place, and time Cognitive/Intellectual Conditions Management [RPT]: None reported or documented in medical history or problem list   Health Maintenance Behaviors: Annual physical exam Health Facilitated by: Healthy diet, Rest  Neurological Neurological Review of Symptoms: No symptoms reported    HEENT HEENT Symptoms Reported: No symptoms reported      Cardiovascular Cardiovascular Symptoms Reported: No symptoms reported Does patient have uncontrolled Hypertension?: No Is patient checking Blood Pressure at home?: No Patient's Recent BP reading at home: patient reports BP checked at dialysis center. does not check at home    Respiratory Respiratory Symptoms Reported: No symptoms reported    Endocrine Endocrine Symptoms Reported: No symptoms reported Is patient diabetic?: Yes Is patient checking blood sugars at home?: Yes List most recent blood sugar readings, include date and time of day: patient reports BS 197 this morning. Patient states he ate eggs and ham around 3am this morning. Patient reports low x1 and BS dropped to 56, treated with glucose tablets. patient reports rechecked 15 minutes and was 90. then patient reports he ate a meal. Using Dexcom. Endocrine Self-Management Outcome: 4 (good) Endocrine Comment: patient reports he is managing his BS a little better now.  Gastrointestinal Gastrointestinal Symptoms Reported: No symptoms reported      Genitourinary Genitourinary Symptoms Reported: No symptoms reported  Integumentary Integumentary Symptoms Reported: No symptoms reported    Musculoskeletal Musculoskelatal  Symptoms Reviewed: No symptoms reported        Psychosocial Psychosocial Symptoms Reported: No symptoms reported         There were no vitals filed for this visit.    Medications Reviewed Today     Reviewed by Lorane Cousar M, RN (Registered Nurse) on 07/26/24 at 1450  Med List Status: <None>   Medication Order Taking? Sig Documenting Provider Last Dose Status Informant  albuterol  (VENTOLIN  HFA) 108 (90 Base) MCG/ACT inhaler [id number] Yes Inhale 2 puffs into the lungs every 4 (four) hours as needed for wheezing or shortness of breath. [provider]  Active Self, Pharmacy Records  atorvastatin  (LIPITOR) 80 MG tablet [id number] Yes Take 1 tablet (80 mg total) by mouth at bedtime. Thedora Garnette HERO, MD  Active   bumetanide  (BUMEX ) 2 MG tablet [id number] Yes Take 2 mg by mouth 2 (two) times daily. [provider]  Active   carvedilol  (COREG ) 25 MG tablet [id number] Yes Take 1 tablet (25 mg total) by mouth 2 (two) times daily. Thedora Garnette HERO, MD  Active   chlorhexidine (PERIDEX) 0.12 % solution [id number] Yes RINSE MOUTH WITH (1 CAPFUL) FOR 30 SECONDS IN MORNING AND EVENING AFTER BRUSHING, THEN SPIT [provider]  Active   clotrimazole  (CLOTRIMAZOLE  ANTI-FUNGAL) 1 % cream [id number] Yes Apply 1 Application topically 2 (two) times daily. Thedora Garnette HERO, MD  Active   Continuous Glucose Sensor [facility] G7 15 DAY SENSOR) [place] [id number]  by Does not apply route. Change sensor every 15 days [provider]  Active   gabapentin  (NEURONTIN ) 100 MG capsule [id number] Yes TAKE 1 CAPSULE (100 MG TOTAL) BY MOUTH 3 (THREE) TIMES DAILY AS NEEDED (NEUROPATHY PAIN). Thedora Garnette HERO, MD  Active   insulin  aspart protamine - aspart (NOVOLOG  70/30 MIX) (70-30) 100 UNIT/ML FlexPen [id number] Yes Inject 60 Units into the skin 2 (two) times daily with a meal. Thedora Garnette HERO, MD  Active   insulin  glargine (LANTUS  SOLOSTAR) 100 UNIT/ML Solostar Pen [id number] Yes Inject 60 Units  into the skin daily. Thedora Garnette HERO, MD  Active   Insulin  Pen Needle (B-D UF III MINI PEN NEEDLES) 31G X 5 MM MISC [id number]  Use to inject insulin  4 times daily Thedora Garnette HERO, MD  Active   metolazone  (ZAROXOLYN ) 5 MG tablet [id number] Yes Take 5 mg by mouth daily. [provider]  Active   omeprazole  (PRILOSEC) 20 MG capsule [id number] Yes TAKE 1 CAPSULE BY MOUTH EVERY DAY Rudd, Garnette HERO, MD  Active   Semaglutide ,0.25 or 0.5MG /DOS, (OZEMPIC , 0.25 OR 0.5 MG/DOSE,) 2 MG/1.5ML SOPN [id number] Yes Inject into the skin. [provider]  Active            Med Note ZENA, CHERYL A   Wed Jul 05, 2024 11:16 AM) Novo PAP  traMADol  (ULTRAM ) 50 MG tablet [id number] Yes TAKE 1 TABLET BY MOUTH EVERY 8 HOURS AS NEEDED Thedora Garnette HERO, MD  Active           Recommendation:   Specialty provider follow-up endocrinology. RNCM transferred call for patient to reschedule office visit.  Follow Up Plan:   Telephone follow up appointment date/time:  08/28/24 at 1:30 pm  Heddy Shutter, RN, MSN, BSN, CCM [place]  [facility], Population Health Case Manager Phone: [phone]

## 2024-07-31 DIAGNOSIS — E1122 Type 2 diabetes mellitus with diabetic chronic kidney disease: Secondary | ICD-10-CM | POA: Diagnosis not present

## 2024-08-02 ENCOUNTER — Other Ambulatory Visit: Payer: Self-pay | Admitting: Family Medicine

## 2024-08-02 DIAGNOSIS — I5022 Chronic systolic (congestive) heart failure: Secondary | ICD-10-CM

## 2024-08-09 NOTE — Telephone Encounter (Signed)
 PAP: Patient assistance application for Novolog  Mix 70/30 has been approved by PAP Companies: [employer] from 08/10/2024 to 08/09/2025. Medication should be delivered to PAP Delivery: Provider's office. For further shipping updates, please contact [employer] at [phone]. Patient ID is: Not provided.

## 2024-08-14 ENCOUNTER — Telehealth: Payer: Self-pay

## 2024-08-14 NOTE — Telephone Encounter (Signed)
 Copied from CRM [id number]. Topic: General - Other >> Aug 14, 2024 12:02 PM Chasity T wrote: Reason for CRM: Pt is calling requesting for pcp to contact his insurance [employer] due to some paperwork that was never sent back to them. He states if Dr Thedora does not contact he will lose insurance.  Phone number: [phone]

## 2024-08-14 NOTE — Telephone Encounter (Signed)
Left VM to rtn call. Dm/cma       

## 2024-08-15 NOTE — Telephone Encounter (Signed)
 Received form from [employer], filled out/signed/faxed back to them @ [phone] .  Left detailed VM that we faxed form today. Dm/cma

## 2024-08-17 ENCOUNTER — Other Ambulatory Visit: Payer: Self-pay | Admitting: Family Medicine

## 2024-08-17 DIAGNOSIS — E1140 Type 2 diabetes mellitus with diabetic neuropathy, unspecified: Secondary | ICD-10-CM

## 2024-08-23 ENCOUNTER — Other Ambulatory Visit: Payer: Self-pay

## 2024-08-23 ENCOUNTER — Telehealth: Payer: Self-pay

## 2024-08-23 DIAGNOSIS — E1122 Type 2 diabetes mellitus with diabetic chronic kidney disease: Secondary | ICD-10-CM

## 2024-08-23 DIAGNOSIS — E782 Mixed hyperlipidemia: Secondary | ICD-10-CM

## 2024-08-23 DIAGNOSIS — I10 Essential (primary) hypertension: Secondary | ICD-10-CM

## 2024-08-23 MED ORDER — OZEMPIC (0.25 OR 0.5 MG/DOSE) 2 MG/3ML ~~LOC~~ SOPN: 0.5000 mg | PEN_INJECTOR | SUBCUTANEOUS | 11 refills | Status: AC

## 2024-08-23 NOTE — Telephone Encounter (Signed)
 Can you please and thank you do a a PA for the Tramadol ? Thanks. Dm/cma

## 2024-08-23 NOTE — Progress Notes (Signed)
" ° °  08/23/2024  Patient ID: Peter Terry, male   DOB: 03/26/1956, 69 y.o.   MRN: [id number]  Ozempic  0.5mg  weekly and tramadol  50mg  PA's have been approved.  Contacted [facility] to [facility].  Ozempic  is going through on insurance for $4.90, antramadol  for around $2.  Telephone follow-up scheduled for 12/30 to see how BG is with Ozempic  back on board.  Channing DELENA Mealing, PharmD, DPLA  "

## 2024-08-23 NOTE — Patient Instructions (Addendum)
 It was a pleasure speaking with you today!  I will contact your pharmacy to process refills for Ozempic .  Take Amlodipine  5mg  tablet - one tablet daily on non-dialysis days (M,W,F,Sun). Continue to monitor your blood glucose and blood pressure   Feel free to call with any questions or concerns!

## 2024-08-23 NOTE — Telephone Encounter (Signed)
 Received patient assistance medications for Ozempic  and Novolog .  Called patient and he will come by and pick them up.  Placed them in the refrigerator in patient assistance bin.  Dm/cma

## 2024-08-23 NOTE — Progress Notes (Signed)
" ° °  08/23/24  Patient ID: Peter Terry, male   DOB: 1956-04-09, 69 y.o.   MRN: [id number]  Subjective/Objective Telephone visit to follow-up on management of diabetes and hypertension   Diabetes: Current medications: Lantus  50 units BID, Novolog  70/30 50 units BID, -Medications tried in the past: metformin caused decreased kidney function -Patient has been out of Ozempic  for a week. Patient was receiving this medication from PAP which is no longer available. -Patient has self adjusted Lantus  and Novolog  regimen. Alternating on Lantus  doses, if too high will do 50u, if BG 210, will take 20u. Reports taking 20 units of Novolog  70/30 BID -Reports BG readings of 55 about 4 times per month; one episode of BG of 390 -A1c 7.6% 11/11, down from 9.1% on 8/12 -No ACEi/ARB on board for cardiorenal protection -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 -Statin for ACVD risk reduction:  atorvastatin  80mg - last lipid panel was >1 year ago, and direct LDL was 106   Hypertension: Current medications: bumetanide  2mg  BID, carvedilol  25mg  BID, metolazone  5mg  daily -Patient continues to hold amlodipine , hydralazine , imdur  and losartan  due to hypotension during dialysis sessions -Patient does monitor home BP and endorses readings of 163/93 and states even some readings at dialysis have been elevated  Assessment/Plan:    Diabetes: -Patient sees Dr. Dartha with endocrinology 2/18 to establish care -Currently uncontrolled based on A1c >7% -Continue current regimen at this time -Will contact pharmacy to process Ozempic  and see if coveredaffordable -Re-enrollment application with Novo PAP for 2026 is in process for patient to continue to receive Novolog  70/30.   -Patient will switch from Lantus  to Tresiba once Lantus  on hand is gone; we can add Tresiba to Novo PAP if/when refill is needed- currently has plenty of long-acting insulin  on hand -Continue Dexcom for CGM -Due for lipid panel.  If LDL >70,  consider addition of ezetimibe 10mg  daily  Hypertension: -Currently uncontrolled, but this is complicated with hypotension occurring with dialysis treatments (patient receives dialysis Tuesday, Thursday, and Friday) -Continue bumetanide  2mg  BID, carvedilol  25mg  BID, and metolazone  5mg  daily at this time -Monitor and record home BP regularly -Recommend to start amlodipine  5mg  on non-dialysis days (MWFSun), increasing slowly as needed/tolerated.   Follow-up:  Will contact patient to discuss affordability of Ozempic  and schedule a follow-up at that time   Channing DELENA Mealing, PharmD, DPLA  "

## 2024-08-23 NOTE — Progress Notes (Signed)
" ° °  08/23/2024  Patient ID: Peter Terry, male   DOB: November 16, 1955, 69 y.o.   MRN: [id number]  [facility], and Ozempic  0.5mg  weekly and tramadol  are needing a prior authorization.  Submitted requests via CoverMyMeds and will monitor progress.    Channing DELENA Mealing, PharmD, DPLA  "

## 2024-08-24 NOTE — Telephone Encounter (Signed)
 Pt picked up 4 boxes Novolog  and 2 boxes Ozempic .

## 2024-08-25 ENCOUNTER — Other Ambulatory Visit (HOSPITAL_COMMUNITY): Payer: Self-pay

## 2024-08-25 NOTE — Telephone Encounter (Signed)
 Pharmacy Patient Advocate Encounter   Received notification from Physician's Office that prior authorization for tramadol  is required/requested.   Insurance verification completed.   The patient is insured through [employer].   Per test claim: Refill too soon. PA is not needed at this time. Medication was filled 08/23/24. Next eligible fill date is 09/01/24.

## 2024-08-28 ENCOUNTER — Telehealth: Payer: Self-pay

## 2024-08-28 NOTE — Patient Instructions (Signed)
 Peter Terry - I am sorry I was unable to reach you today for our scheduled appointment. I work with [place], Garnette HERO, MD and am calling to support your healthcare needs. Please contact me at [phone] at your earliest convenience. I look forward to speaking with you soon.   Thank you,  Heddy Shutter, RN, MSN, BSN, CCM [place]  [facility], Population Health Case Manager Phone: [phone]

## 2024-09-08 ENCOUNTER — Other Ambulatory Visit: Payer: Self-pay

## 2024-09-08 DIAGNOSIS — I10 Essential (primary) hypertension: Secondary | ICD-10-CM

## 2024-09-08 DIAGNOSIS — E1122 Type 2 diabetes mellitus with diabetic chronic kidney disease: Secondary | ICD-10-CM

## 2024-09-08 NOTE — Progress Notes (Signed)
" ° °  09/08/24  Patient ID: Peter Terry, male   DOB: 1955/10/10, 69 y.o.   MRN: [id number]  Subjective/Objective Telephone visit to follow-up on management of diabetes and hypertension   Diabetes: Current medications: Lantus  35 units BID, Novolog  70/30 20-25 units BID, Ozempic  0.5mg  weekly -Medications tried in the past: metformin caused decreased kidney function -Patient has been out of Ozempic  for a week. Patient was receiving this medication from PAP which is no longer available. -Patient has self adjusted Lantus  and Novolog  regimen based on some hypoglycemia.  He also endorses forgetting insulin  doses on occasion.   -Using Dexcom G7 for CGM -States days he remembers insulin  doses, BG will be 135-135; if he forgets, BG will be >300 -No ACEi/ARB on board for cardiorenal protection -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 -Statin for ACVD risk reduction:  atorvastatin  80mg - last lipid panel was >1 year ago, and direct LDL was 106   Lab Results  Component Value Date   HGBA1C 7.6 (H) 06/20/2024   HGBA1C 9.1 (H) 03/20/2024   HGBA1C 9.2 (H) 10/12/2023    Hypertension: Current medications: bumetanide  2mg  BID, carvedilol  25mg  BID, metolazone  5mg  daily, amlodipine  5mg  daily non-dialysis days -Patient continues to hold hydralazine , imdur  and losartan  due to hypotension during dialysis sessions -Recently resumed amlodipine  5mg  daily non-dialysis days based on elevated BP readings of late -He has not checked home BP regularly since resuming amlodipine , but he does state BP at dialysis before treatment has been as high as 172/85, coming down to 135/80 after  Assessment/Plan:    Diabetes: -Patient sees Dr. Dartha with endocrinology 2/18 to establish care -Currently uncontrolled based on A1c >7% -Continue current regimen at this time -Advised patient to set 5 pen needles out each morning where he can easily see them to help remember and keep up with insulin  doses to prevent missing or  duplicating -Patient will switch from Lantus  to Tresiba once Lantus  on hand is gone; we can add Tresiba to Novo PAP if/when refill is needed- currently has plenty of long-acting insulin  on hand -Continue Dexcom for CGM -Due for lipid panel.  If LDL >70, consider addition of ezetimibe 10mg  daily  Hypertension: -Currently uncontrolled, but this is complicated with hypotension occurring with dialysis treatments (patient receives dialysis Tuesday, Thursday, and Friday) -Continue bumetanide  2mg  BID, carvedilol  25mg  BID, amlodipine  5mg , and metolazone  5mg  daily at this time -Advised patient to begin checking home BP a few hours after returning home from HD and in the morning non-HD days 1 hour after taking morning medications to help determine if medications need to be adjusted to optimize BP control   Follow-up:  3/20   Channing DELENA Mealing, PharmD, DPLA  "

## 2024-09-15 ENCOUNTER — Other Ambulatory Visit: Payer: Self-pay

## 2024-09-15 NOTE — Patient Outreach (Signed)
 Complex Care Management   Visit Note  09/15/2024  Name:  Peter Terry MRN: [id number] DOB: 1955-10-03  Situation: Referral received for Complex Care Management related to ESRD and Diabetes with Complications I obtained verbal consent from Patient.  Visit completed with Patient  on the phone  Background:   Past Medical History:  Diagnosis Date   Acute heart failure with preserved ejection fraction (HFpEF) (HCC) 05/02/2023   Acute on chronic diastolic CHF (congestive heart failure) (HCC) 08/25/2023   Acute renal failure superimposed on stage 4 chronic kidney disease (HCC) 08/26/2023   Anemia of chronic disease 09/01/2023   Cellulitis of right lower extremity 09/01/2023   CHF (congestive heart failure) (HCC)    Chronic kidney disease, stage 4 (severe) (HCC) 03/31/2023   Chronic right hip pain 07/13/2023   COPD (chronic obstructive pulmonary disease) (HCC) 03/30/2023   Diabetic peripheral neuropathy (HCC) 03/31/2023   Dyspnea on exertion 05/02/2023   Edema of both lower legs 03/31/2023   Essential hypertension 03/30/2023   GERD (gastroesophageal reflux disease) 03/31/2023   Heart Failure with Preserved EF (HCC) 03/31/2023   Hypomagnesemia 11/04/2022   Insulin  long-term use (HCC) 08/21/2020   Iron  deficiency anemia 03/31/2022   Mixed hyperlipidemia 03/30/2023   Morbid obesity with BMI of 45.0-49.9, adult (HCC) 07/25/2020   Morbid obesity with body mass index (BMI) of 50.0 to 59.9 in adult (HCC) 07/25/2020   Multiple open wounds of lower extremity 05/02/2023   Obesity, class 3 (HCC) 09/01/2023   Obstructive sleep apnea 03/31/2023   Reactive depression 10/11/2023   Retinal tear 02/20/2013   Secondary hyperparathyroidism of renal origin 10/11/2023   Tinea pedis 03/31/2023   Type 2 diabetes mellitus with diabetic neuropathy, unspecified (HCC) 11/04/2022   Type 2 diabetes mellitus with hyperglycemia, with long-term current use of insulin  (HCC) 05/02/2023   Type 2 diabetes mellitus  with stage 4 chronic kidney disease, with long-term current use of insulin  (HCC) 11/04/2022   Venous stasis dermatitis 03/31/2023   Venous stasis ulcer of left calf limited to breakdown of skin without varicose veins (HCC) 07/13/2023    Assessment: Patient continue HD on Tuesday- Thursday and Saturday. Patient reports no questions or concerns at this time.  Patient Reported Symptoms:  Cognitive Cognitive Status: Alert and oriented to person, place, and time      Neurological Neurological Review of Symptoms: No symptoms reported    HEENT HEENT Symptoms Reported: No symptoms reported      Cardiovascular Cardiovascular Symptoms Reported: No symptoms reported Does patient have uncontrolled Hypertension?: No Is patient checking Blood Pressure at home?: Yes Patient's Recent BP reading at home: Patient reports he check it once awhile/ 150/95 this morning before medication. Patient reports it's also monitored on dialysis days.    Respiratory Respiratory Symptoms Reported: No symptoms reported Respiratory Management Strategies: Adequate rest, Medication therapy, Routine screening  Endocrine Endocrine Symptoms Reported: No symptoms reported Is patient diabetic?: Yes Is patient checking blood sugars at home?: Yes List most recent blood sugar readings, include date and time of day: Patient denies any signs or symptoms of hypoglyciam or hyperglycimia.    Gastrointestinal Gastrointestinal Symptoms Reported: No symptoms reported      Genitourinary Genitourinary Symptoms Reported: No symptoms reported Additional Genitourinary Details: HD continue on Tues/Thurs/Sat using central venous catheter.    Integumentary Integumentary Symptoms Reported: No symptoms reported Additional Integumentary Details: Patient dennies any skin problems at this time. Skin Management Strategies: Adequate rest, Activity, Diet modification, Routine screening, Medication therapy  Musculoskeletal Musculoskelatal Symptoms  Reviewed: Joint pain Other Musculoskeletal Symptoms: patient reports he gets some joint pain after HD, then resolved later on that day. patient states he uses cane very so often lately. Patient denies any joint pain today. Musculoskeletal Management Strategies: Medication therapy, Routine screening, Activity, Diet modification      Psychosocial Psychosocial Symptoms Reported: No symptoms reported          Today's Vitals   09/15/24 1139  BP: (!) 150/95   Pain Score: 0-No pain  Medications Reviewed Today     Reviewed by Lenita Fountain, RN (Registered Nurse) on 09/15/24 at 1135  Med List Status: <None>   Medication Order Taking? Sig Documenting Provider Last Dose Status Informant  albuterol  (VENTOLIN  HFA) 108 (90 Base) MCG/ACT inhaler [id number] Yes Inhale 2 puffs into the lungs every 4 (four) hours as needed for wheezing or shortness of breath. [provider]  Active Self, Pharmacy Records  amLODipine  (NORVASC ) 5 MG tablet [id number] Yes Take 5 mg by mouth. On non-dialysis days [provider]  Active   atorvastatin  (LIPITOR) 80 MG tablet [id number] Yes Take 1 tablet (80 mg total) by mouth at bedtime. Thedora Garnette HERO, MD  Active   bumetanide  (BUMEX ) 2 MG tablet [id number] Yes Take 2 mg by mouth 2 (two) times daily. [provider]  Active   carvedilol  (COREG ) 25 MG tablet [id number] Yes TAKE 1 TABLET BY MOUTH TWICE A DAY Webb, Padonda B, FNP  Active   chlorhexidine (PERIDEX) 0.12 % solution [id number] Yes RINSE MOUTH WITH (1 CAPFUL) FOR 30 SECONDS IN MORNING AND EVENING AFTER BRUSHING, THEN SPIT [provider]  Active   clotrimazole  (CLOTRIMAZOLE  ANTI-FUNGAL) 1 % cream [id number] Yes Apply 1 Application topically 2 (two) times daily. Thedora Garnette HERO, MD  Active   Continuous Glucose Sensor [facility] G7 15 DAY SENSOR) [place] [id number] Yes by Does not apply route. Change sensor every 15 days [provider]  Active   gabapentin  (NEURONTIN ) 100 MG  capsule [id number] Yes TAKE 1 CAPSULE (100 MG TOTAL) BY MOUTH 3 (THREE) TIMES DAILY AS NEEDED (NEUROPATHY PAIN). Thedora Garnette HERO, MD  Active   insulin  aspart protamine - aspart (NOVOLOG  70/30 MIX) (70-30) 100 UNIT/ML FlexPen [id number] Yes Inject 60 Units into the skin 2 (two) times daily with a meal. Thedora Garnette HERO, MD  Active   insulin  glargine (LANTUS  SOLOSTAR) 100 UNIT/ML Solostar Pen [id number] Yes Inject 60 Units into the skin daily.  Patient taking differently: Inject 35 Units into the skin 2 (two) times daily.   Thedora Garnette HERO, MD  Active   Insulin  Pen Needle (B-D UF III MINI PEN NEEDLES) 31G X 5 MM MISC [id number] Yes Use to inject insulin  4 times daily Thedora Garnette HERO, MD  Active   metolazone  (ZAROXOLYN ) 5 MG tablet [id number] Yes Take 5 mg by mouth daily. [provider]  Active   omeprazole  (PRILOSEC) 20 MG capsule [id number] Yes TAKE 1 CAPSULE BY MOUTH EVERY DAY Rudd, Garnette HERO, MD  Active   Semaglutide ,0.25 or 0.5MG /DOS, (OZEMPIC , 0.25 OR 0.5 MG/DOSE,) 2 MG/3ML SOPN [id number] Yes Inject 0.5 mg into the skin once a week. Thedora Garnette HERO, MD  Active   traMADol  (ULTRAM ) 50 MG tablet [id number] Yes TAKE 1 TABLET BY MOUTH EVERY 8 HOURS AS NEEDED Thedora Garnette HERO, MD  Active            Recommendation:   Continue Current Plan of Care  Follow Up Plan:   Telephone follow up appointment date/time:  10/13/24 at  1:00 p.m. with Elida Pulse, RNCM  Franklin Honour, RN, BSN [place]/ [facility]-Population Health RN care manager Direct dial : [phone]

## 2024-09-15 NOTE — Patient Instructions (Signed)
 Visit Information  Thank you for taking time to visit with me today. Please don't hesitate to contact me if I can be of assistance to you before our next scheduled appointment.  Your next care management appointment is by telephone on 10/13/24 at 1:00 p.m with Colleen, Brue[place] RNCM  Please call the care guide team at [phone] if you need to cancel, schedule, or reschedule an appointment.   Please call the Suicide and Crisis Lifeline: 988 call the [place]  National Suicide Prevention Lifeline: 959-270-8240 or TTY: 303-005-4269 TTY [phone]) to talk to a trained counselor if you are experiencing a Mental Health or Behavioral Health Crisis or need someone to talk to.  Franklin Honour, RN, BSN [place]/ [facility]-Population Health RN care production designer, theatre/television/film Direct dial : [phone]

## 2024-09-20 ENCOUNTER — Ambulatory Visit: Admitting: Family Medicine

## 2024-09-27 ENCOUNTER — Ambulatory Visit: Admitting: "Endocrinology

## 2024-10-13 ENCOUNTER — Telehealth

## 2024-10-27 ENCOUNTER — Other Ambulatory Visit

## 2025-06-08 ENCOUNTER — Ambulatory Visit
